# Patient Record
Sex: Female | Born: 1970 | Race: White | Hispanic: No | Marital: Single | Smoking: Former smoker
Health system: Southern US, Community
[De-identification: ages and names within clinical notes are randomized; demographics above are authoritative.]

## PROBLEM LIST (undated history)

## (undated) DIAGNOSIS — E119 Type 2 diabetes mellitus without complications: Secondary | ICD-10-CM

## (undated) DIAGNOSIS — I10 Essential (primary) hypertension: Secondary | ICD-10-CM

## (undated) HISTORY — PX: CARPAL TUNNEL RELEASE: SHX101

## (undated) HISTORY — DX: Essential (primary) hypertension: I10

## (undated) HISTORY — DX: Type 2 diabetes mellitus without complications: E11.9

---

## 2004-08-15 ENCOUNTER — Ambulatory Visit: Payer: Self-pay | Admitting: Unknown Physician Specialty

## 2004-09-15 ENCOUNTER — Ambulatory Visit: Payer: Self-pay | Admitting: Unknown Physician Specialty

## 2005-04-14 ENCOUNTER — Ambulatory Visit: Payer: Self-pay | Admitting: Unknown Physician Specialty

## 2005-04-15 ENCOUNTER — Ambulatory Visit: Payer: Self-pay | Admitting: Unknown Physician Specialty

## 2005-05-15 ENCOUNTER — Ambulatory Visit: Payer: Self-pay | Admitting: Unknown Physician Specialty

## 2005-06-15 ENCOUNTER — Ambulatory Visit: Payer: Self-pay | Admitting: Unknown Physician Specialty

## 2005-07-16 ENCOUNTER — Ambulatory Visit: Payer: Self-pay | Admitting: Unknown Physician Specialty

## 2005-11-27 ENCOUNTER — Emergency Department: Payer: Self-pay | Admitting: Emergency Medicine

## 2006-02-05 ENCOUNTER — Emergency Department: Payer: Self-pay | Admitting: Unknown Physician Specialty

## 2006-02-05 IMAGING — CR DG KNEE COMPLETE 4+V*L*
1 series · 4 of 4 positions shown · non-contrast
Comparison: none

REASON FOR EXAM: FALL
COMMENTS:

[Series 1: view not recorded · 0.17mm/px · 4 of 4 slices shown]
[im 1/4]
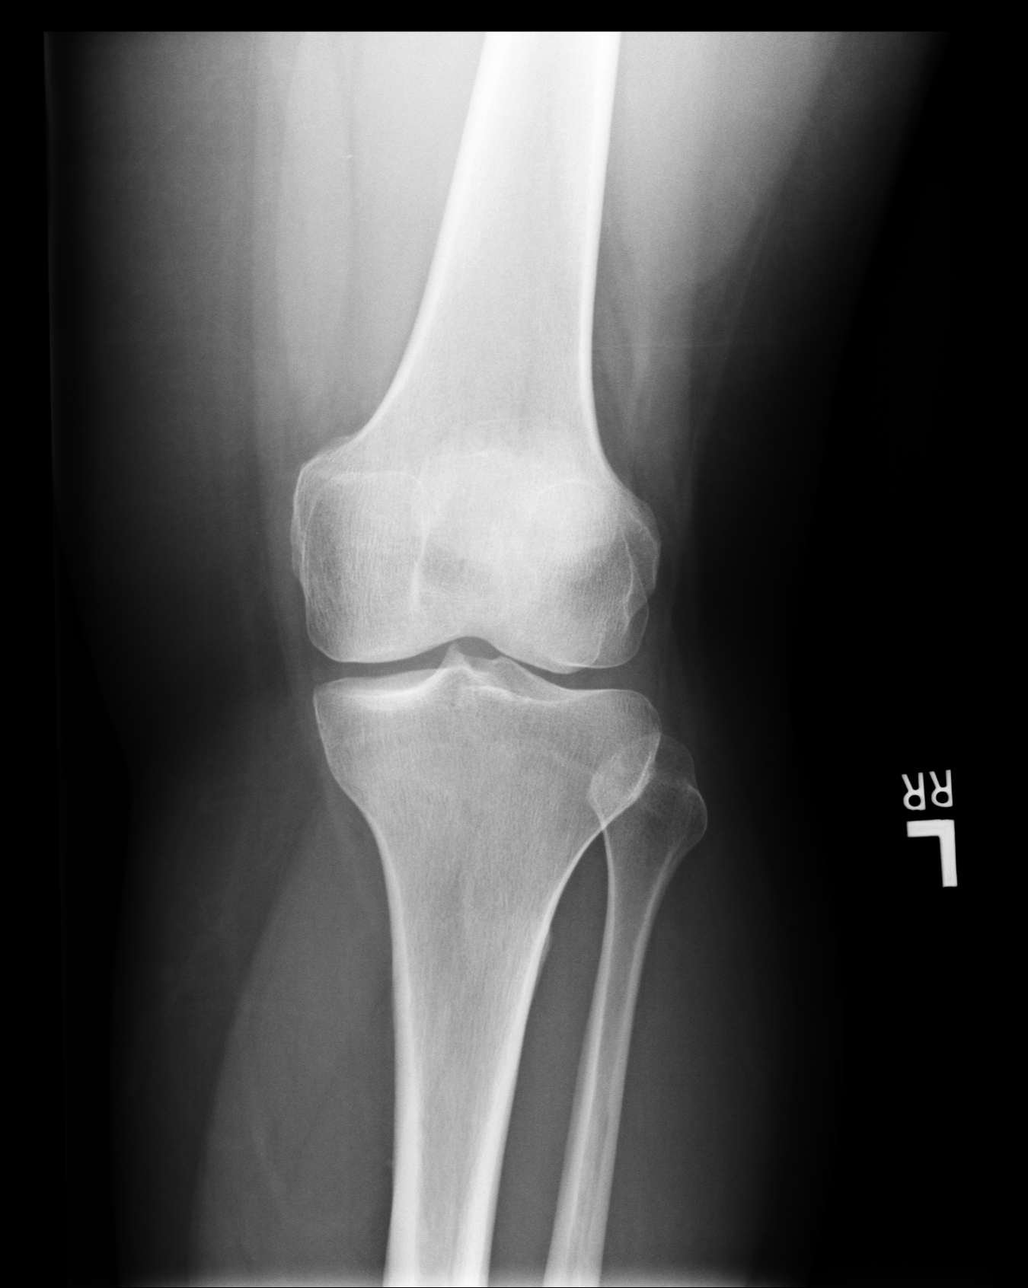
[im 2/4]
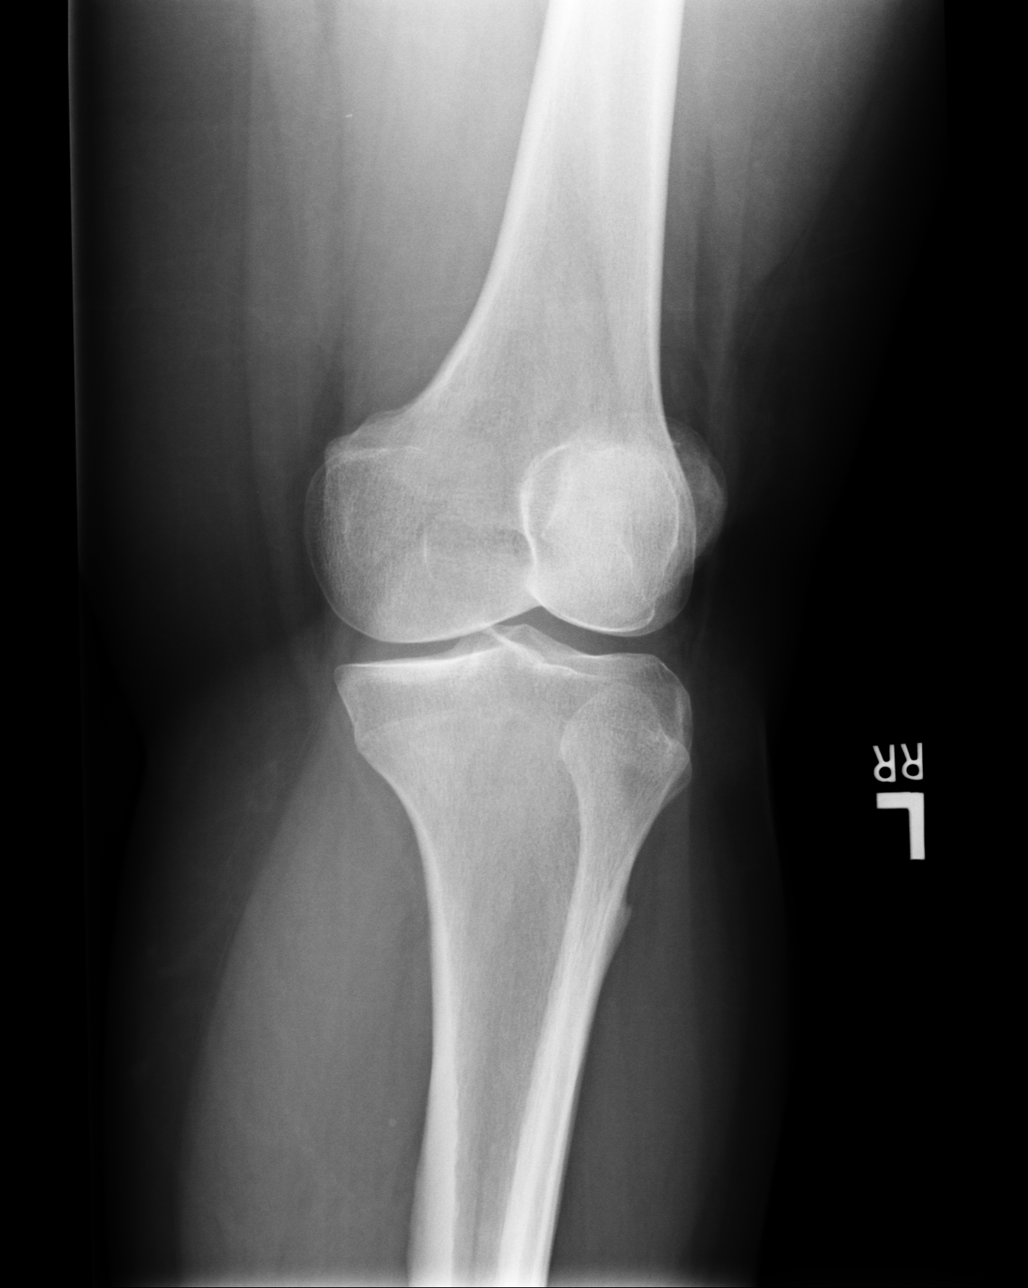
[im 3/4]
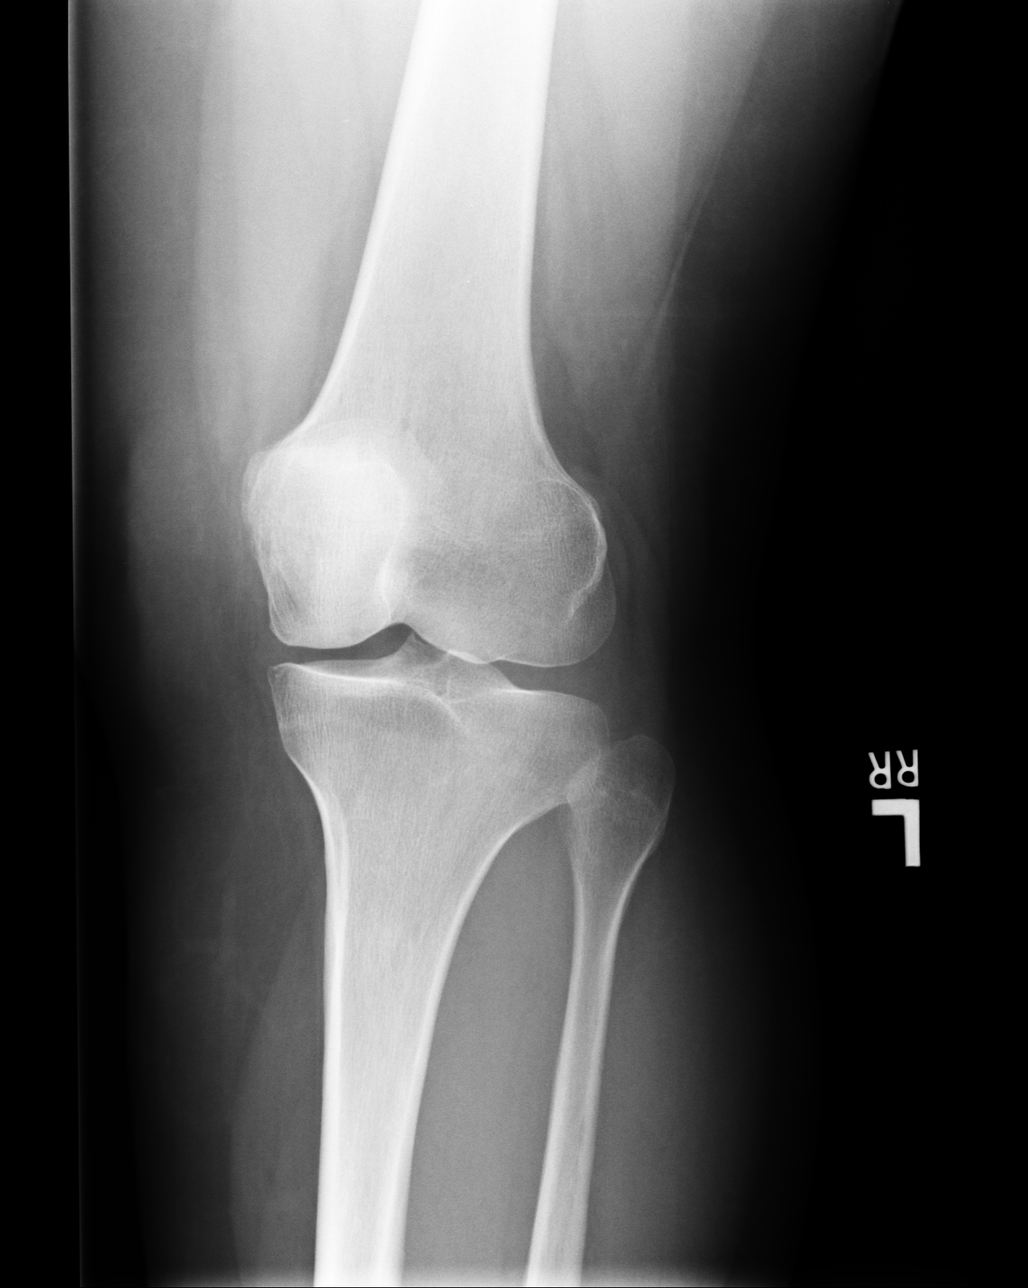
[im 4/4]
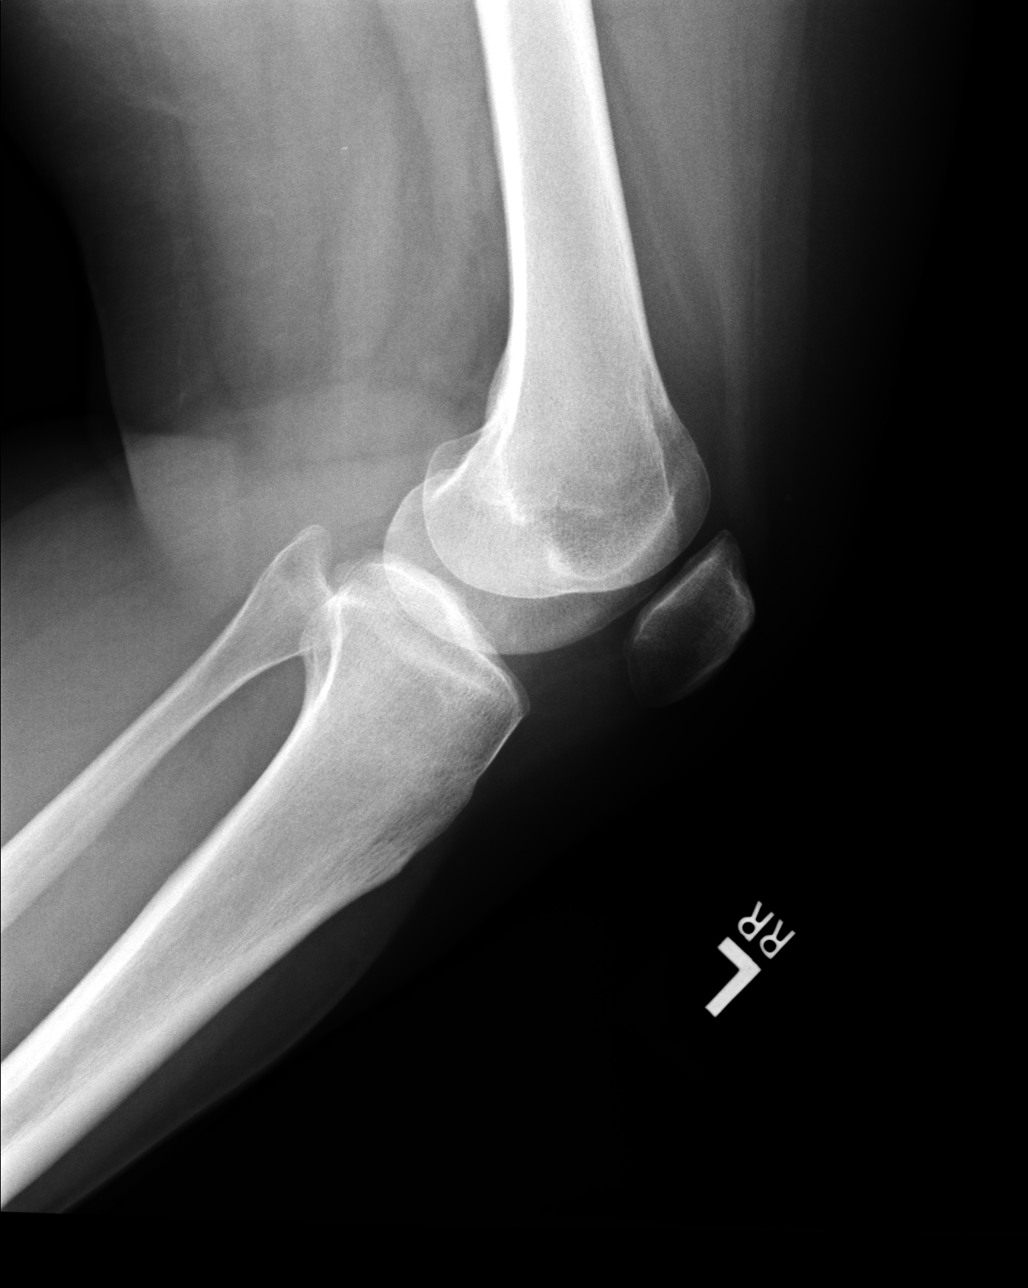

[4 of 4 positions shown; findings below may reference images not displayed]

PROCEDURE:     DXR - DXR KNEE LT COMP WITH OBLIQUES  - [DATE]  [DATE]

RESULT:          There does not appear to be evidence of fracture,
dislocation, or malalignment. No evidence of prepatellar effusion is
identified. If there is persistent clinical concern or persistent complaints
of pain, repeat evaluation in 7-10 days is recommended if clinically
warranted.
IMPRESSION: Unremarkable LEFT knee as described above.

## 2006-04-26 ENCOUNTER — Other Ambulatory Visit: Payer: Self-pay

## 2006-04-26 ENCOUNTER — Emergency Department: Payer: Self-pay | Admitting: Emergency Medicine

## 2006-09-09 ENCOUNTER — Emergency Department: Payer: Self-pay | Admitting: Internal Medicine

## 2006-09-09 ENCOUNTER — Other Ambulatory Visit: Payer: Self-pay

## 2006-09-25 ENCOUNTER — Emergency Department: Payer: Self-pay | Admitting: Emergency Medicine

## 2006-09-25 ENCOUNTER — Other Ambulatory Visit: Payer: Self-pay

## 2006-09-25 IMAGING — CR DG CHEST 2V
1 series · 2 of 2 positions shown · non-contrast
Comparison: none

REASON FOR EXAM: Chest Pain
COMMENTS:  LMP: > one month ago

PROCEDURE:     DXR - DXR CHEST PA (OR AP) AND LATERAL  - [DATE]  [DATE]
RESULT:          PA and lateral views of the chest demonstrate cardiomegaly.
 There is no definite infiltrate or effusion.  There is no pneumothorax.
The inspiratory effort is somewhat shallow.

[Series 1: view not recorded · 0.17mm/px · 2 of 2 slices shown]
[im 1/2]
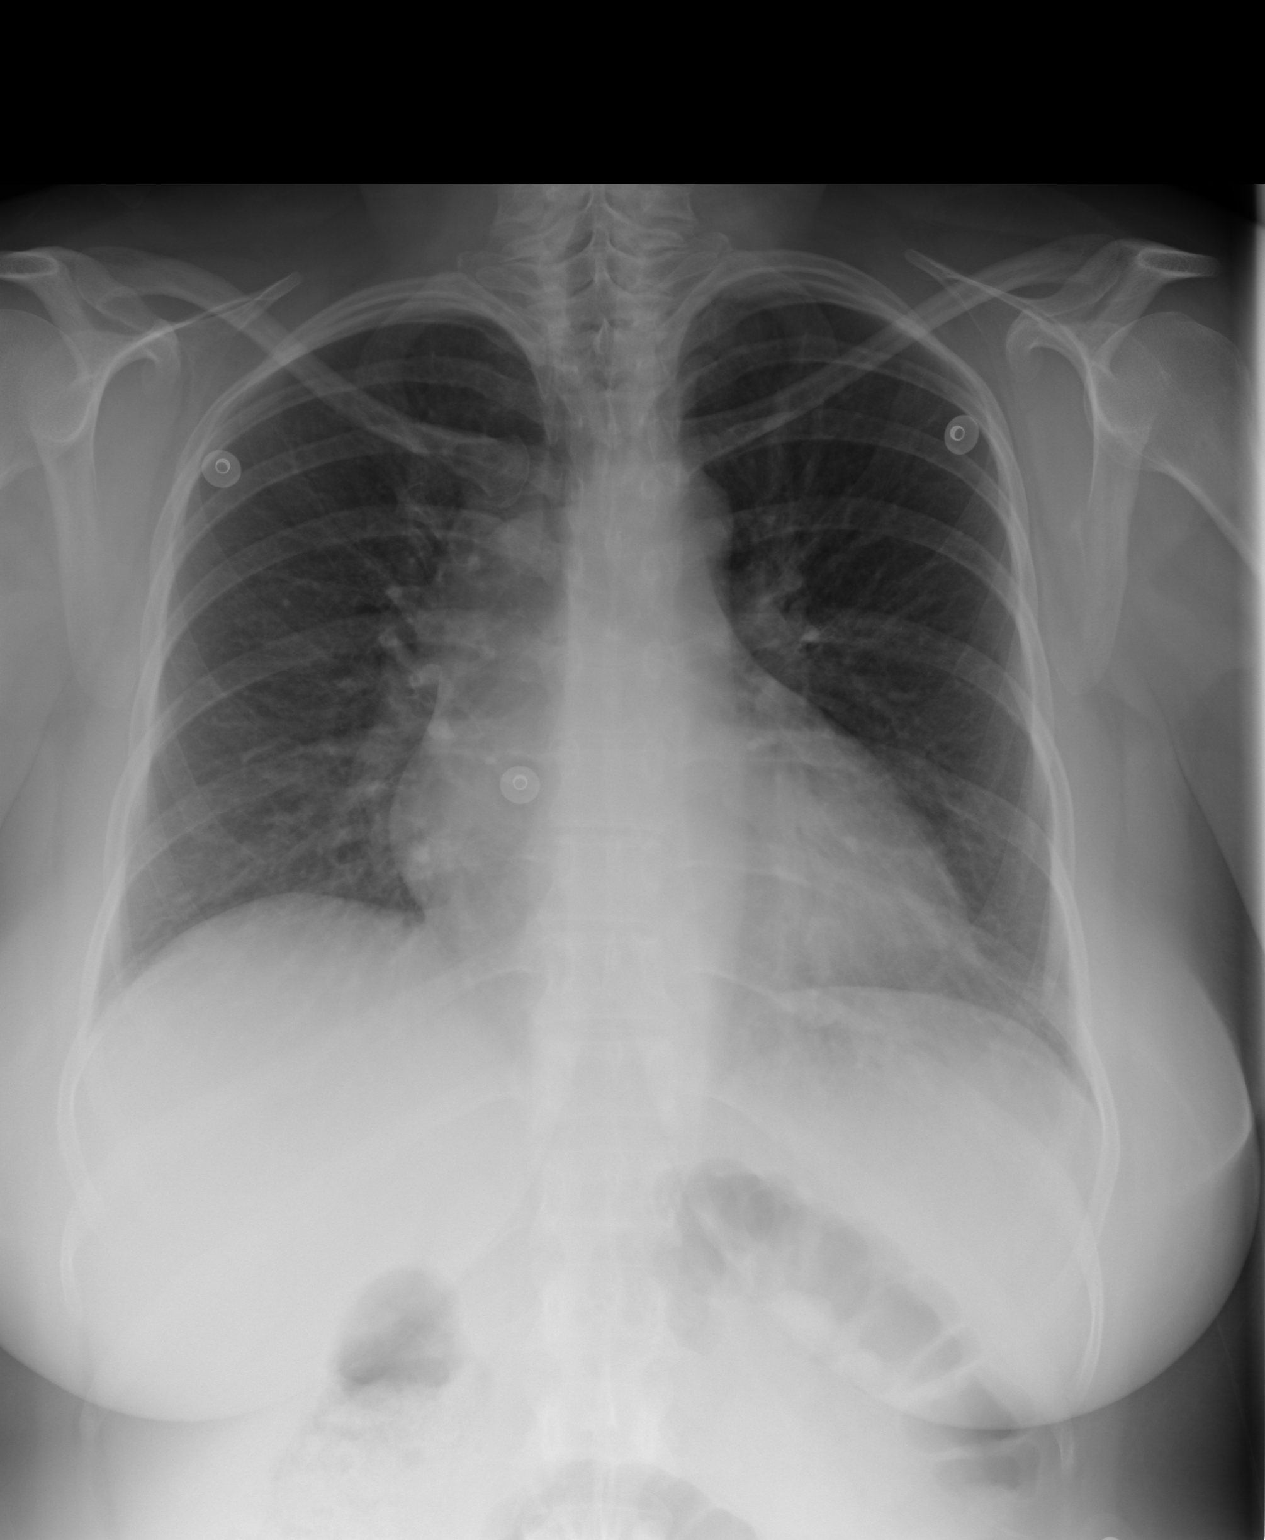
[im 2/2]
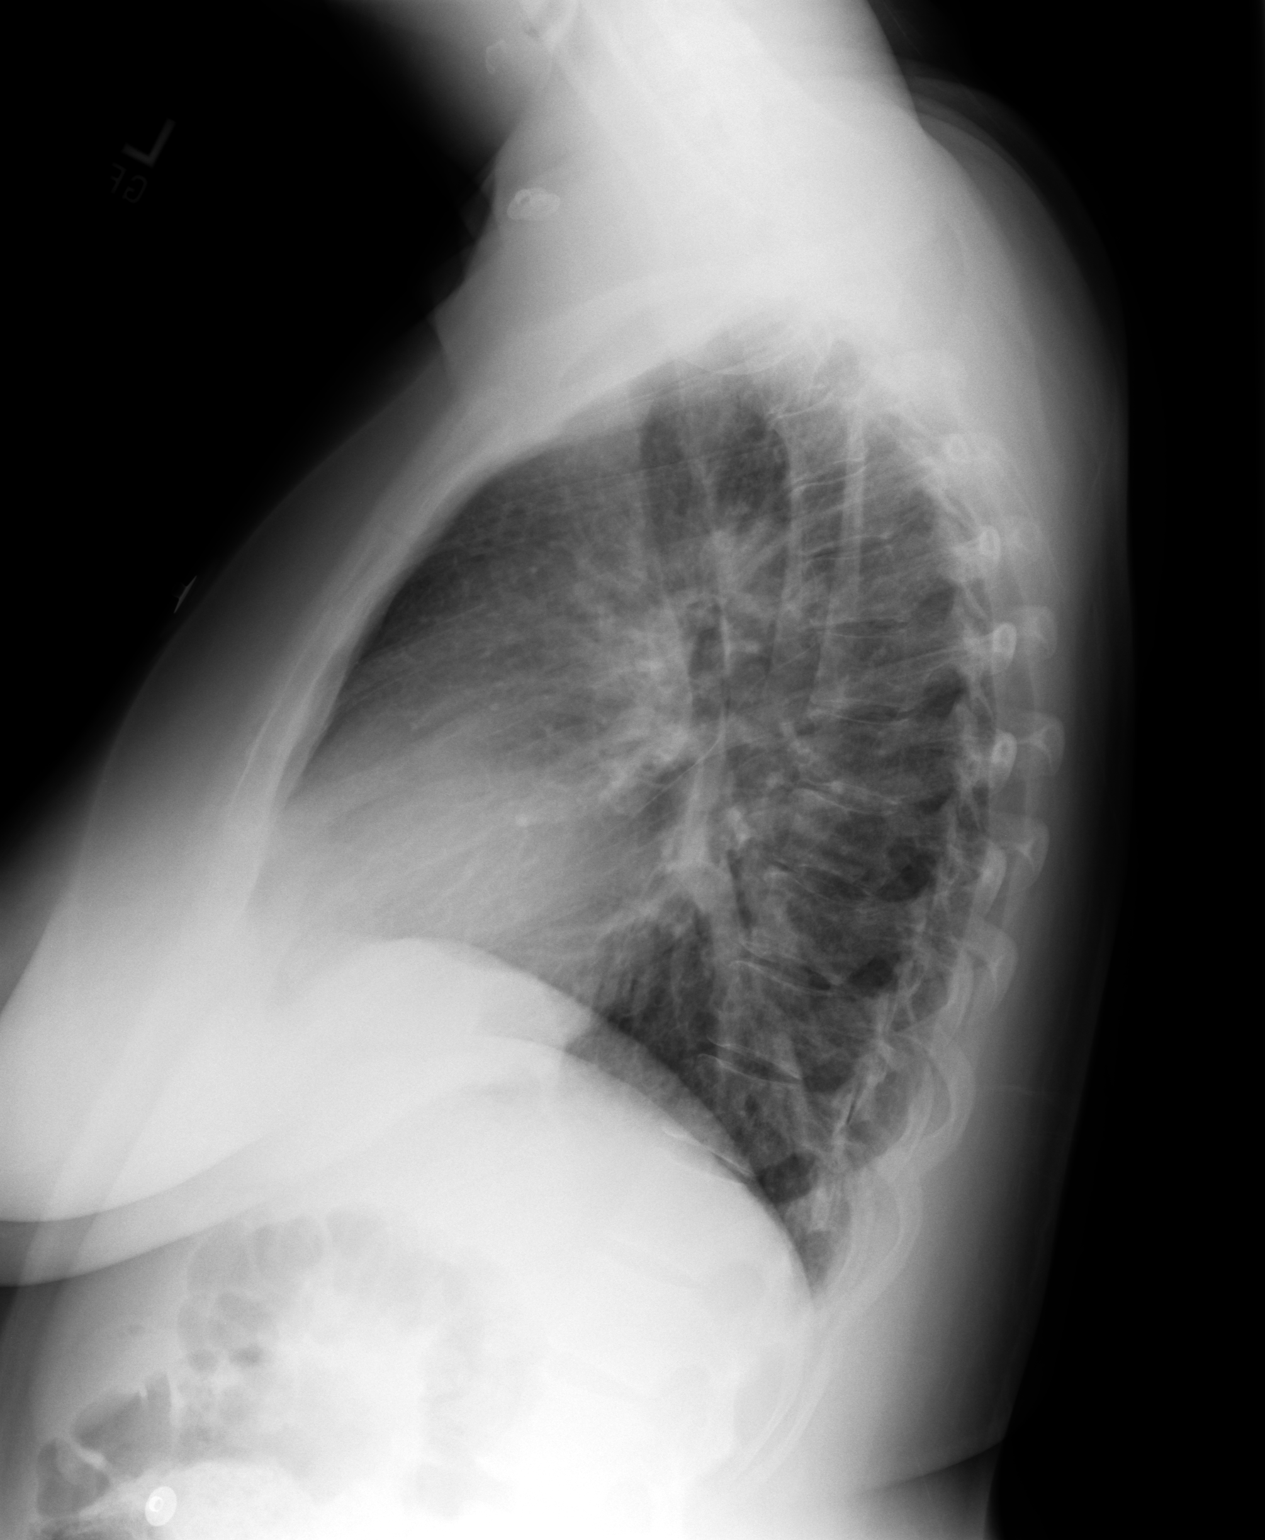

[2 of 2 positions shown; findings below may reference images not displayed]

IMPRESSION: 1.     No acute cardiopulmonary disease.
2.     Mild cardiomegaly.

## 2006-10-25 ENCOUNTER — Emergency Department: Payer: Self-pay | Admitting: Emergency Medicine

## 2007-02-18 ENCOUNTER — Inpatient Hospital Stay: Payer: Self-pay | Admitting: Specialist

## 2007-02-18 IMAGING — CR DG CHEST 2V
1 series · 2 of 2 positions shown · non-contrast
Comparison: none

REASON FOR EXAM: cough, presented with dka
COMMENTS:

[Series 1: view not recorded · 0.17mm/px · 2 of 2 slices shown]
[im 1/2]
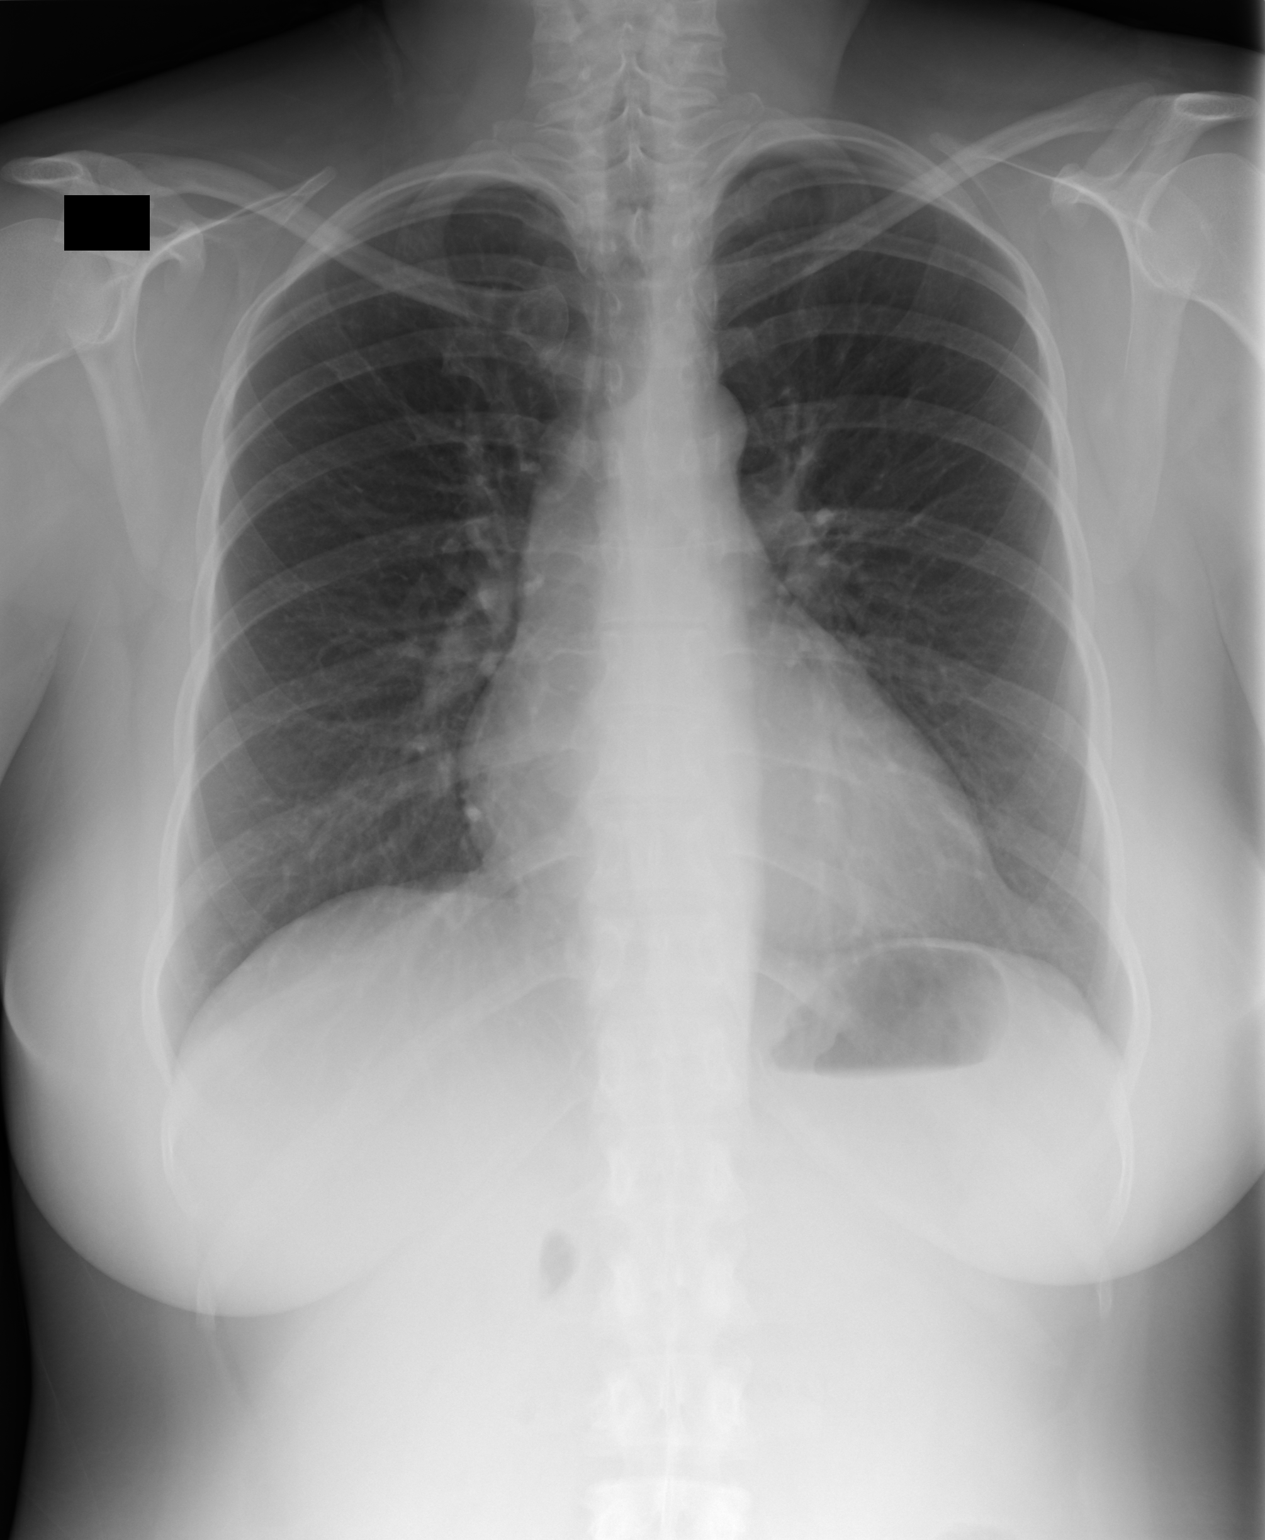
[im 2/2]
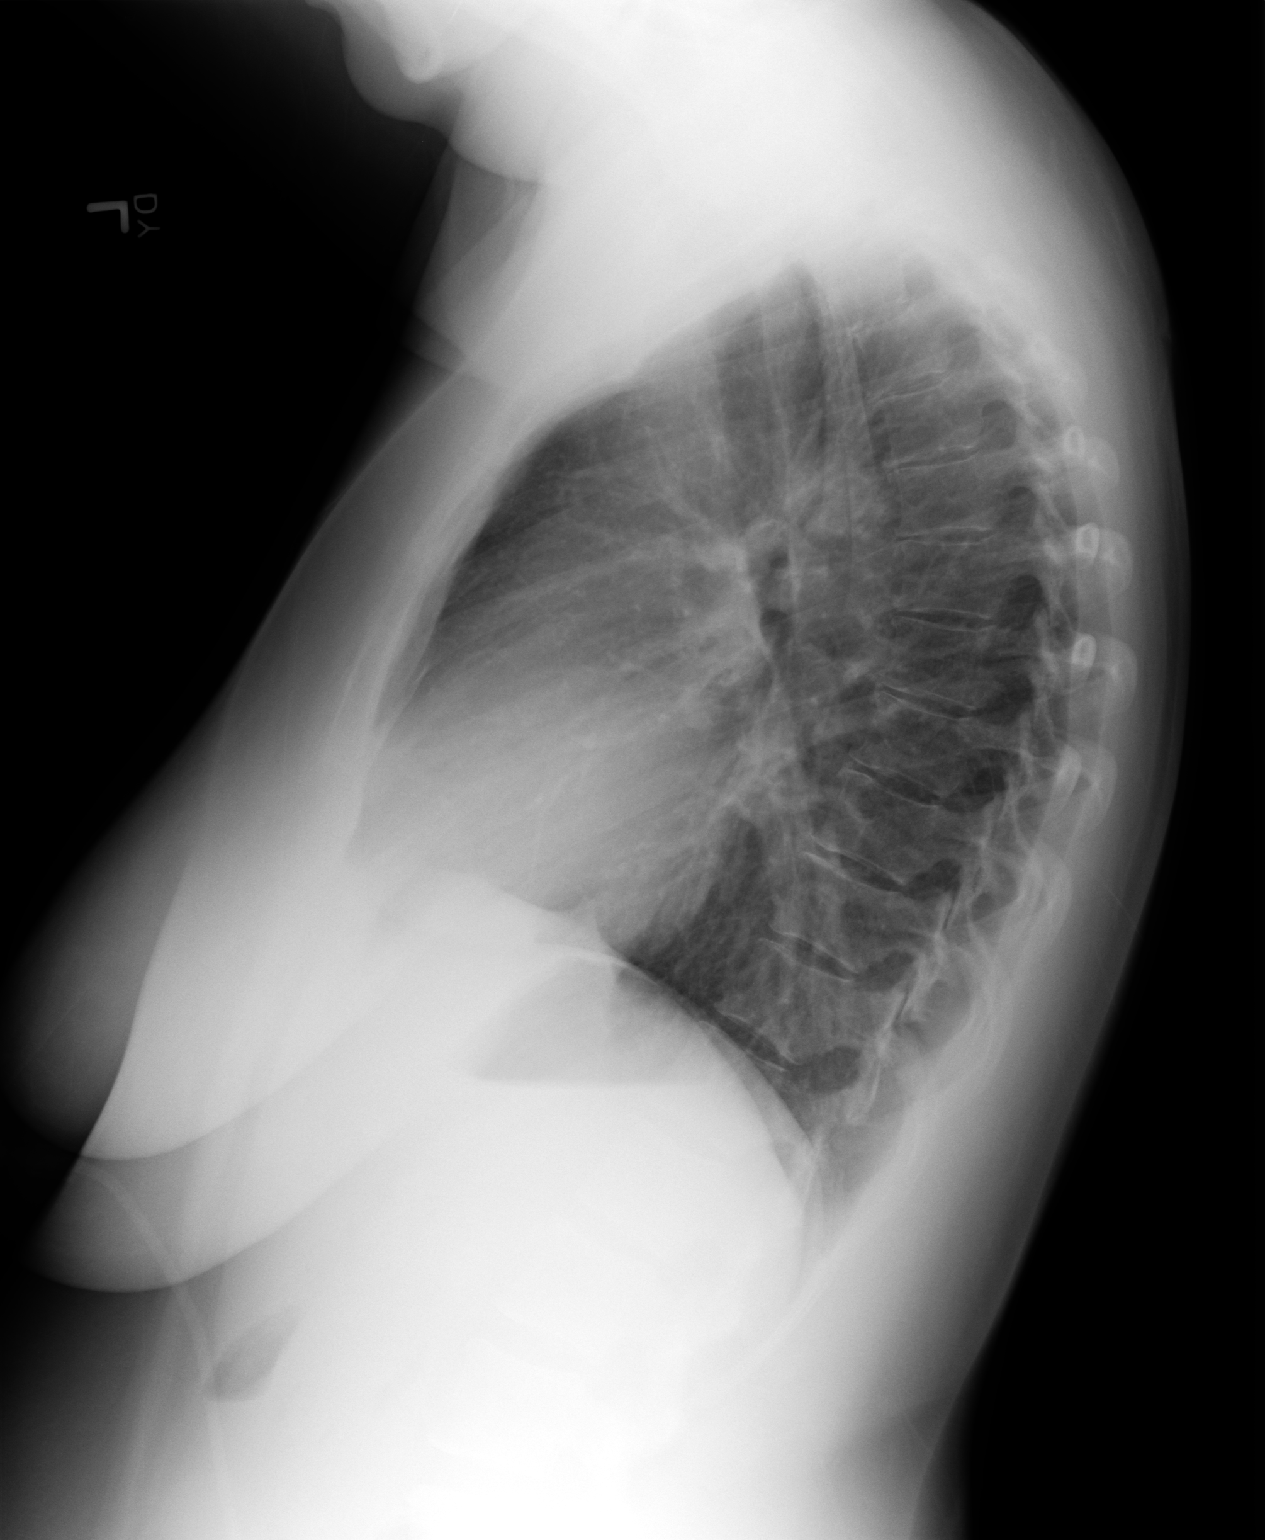

[2 of 2 positions shown; findings below may reference images not displayed]

PROCEDURE:     DXR - DXR CHEST PA (OR AP) AND LATERAL  - [DATE]  [DATE]

RESULT:     Comparison is made to study of [DATE]. The lungs are clear.
The heart and pulmonary vessels are normal. The bony and mediastinal
structures are unremarkable. There is no effusion. There is no pneumothorax
or evidence of congestive failure.
IMPRESSION: No acute cardiopulmonary disease.

## 2007-03-03 ENCOUNTER — Emergency Department: Payer: Self-pay | Admitting: Emergency Medicine
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
## 2007-03-06 ENCOUNTER — Emergency Department: Payer: Self-pay | Admitting: Emergency Medicine

## 2007-03-06 ENCOUNTER — Other Ambulatory Visit: Payer: Self-pay

## 2007-04-17 ENCOUNTER — Other Ambulatory Visit: Payer: Self-pay

## 2007-04-17 ENCOUNTER — Emergency Department: Payer: Self-pay | Admitting: Emergency Medicine

## 2007-04-20 ENCOUNTER — Other Ambulatory Visit: Payer: Self-pay

## 2007-04-20 ENCOUNTER — Inpatient Hospital Stay: Payer: Self-pay | Admitting: Internal Medicine

## 2007-05-06 ENCOUNTER — Other Ambulatory Visit: Payer: Self-pay

## 2007-05-06 ENCOUNTER — Observation Stay: Payer: Self-pay | Admitting: Internal Medicine

## 2007-05-06 IMAGING — CR DG CHEST 1V PORT
1 series · 1 of 1 positions shown · non-contrast
Comparison: none

REASON FOR EXAM: hypoglycemia and SOB; [HOSPITAL]
COMMENTS:

PROCEDURE:     DXR - DXR PORTABLE CHEST SINGLE VIEW  - [DATE] [DATE]
RESULT:     Comparison is made to a prior exam of [DATE].  The lung fields
are clear. The heart, mediastinal and osseous structures show no significant
abnormalities.  Monitoring electrodes are present.

[view not recorded]
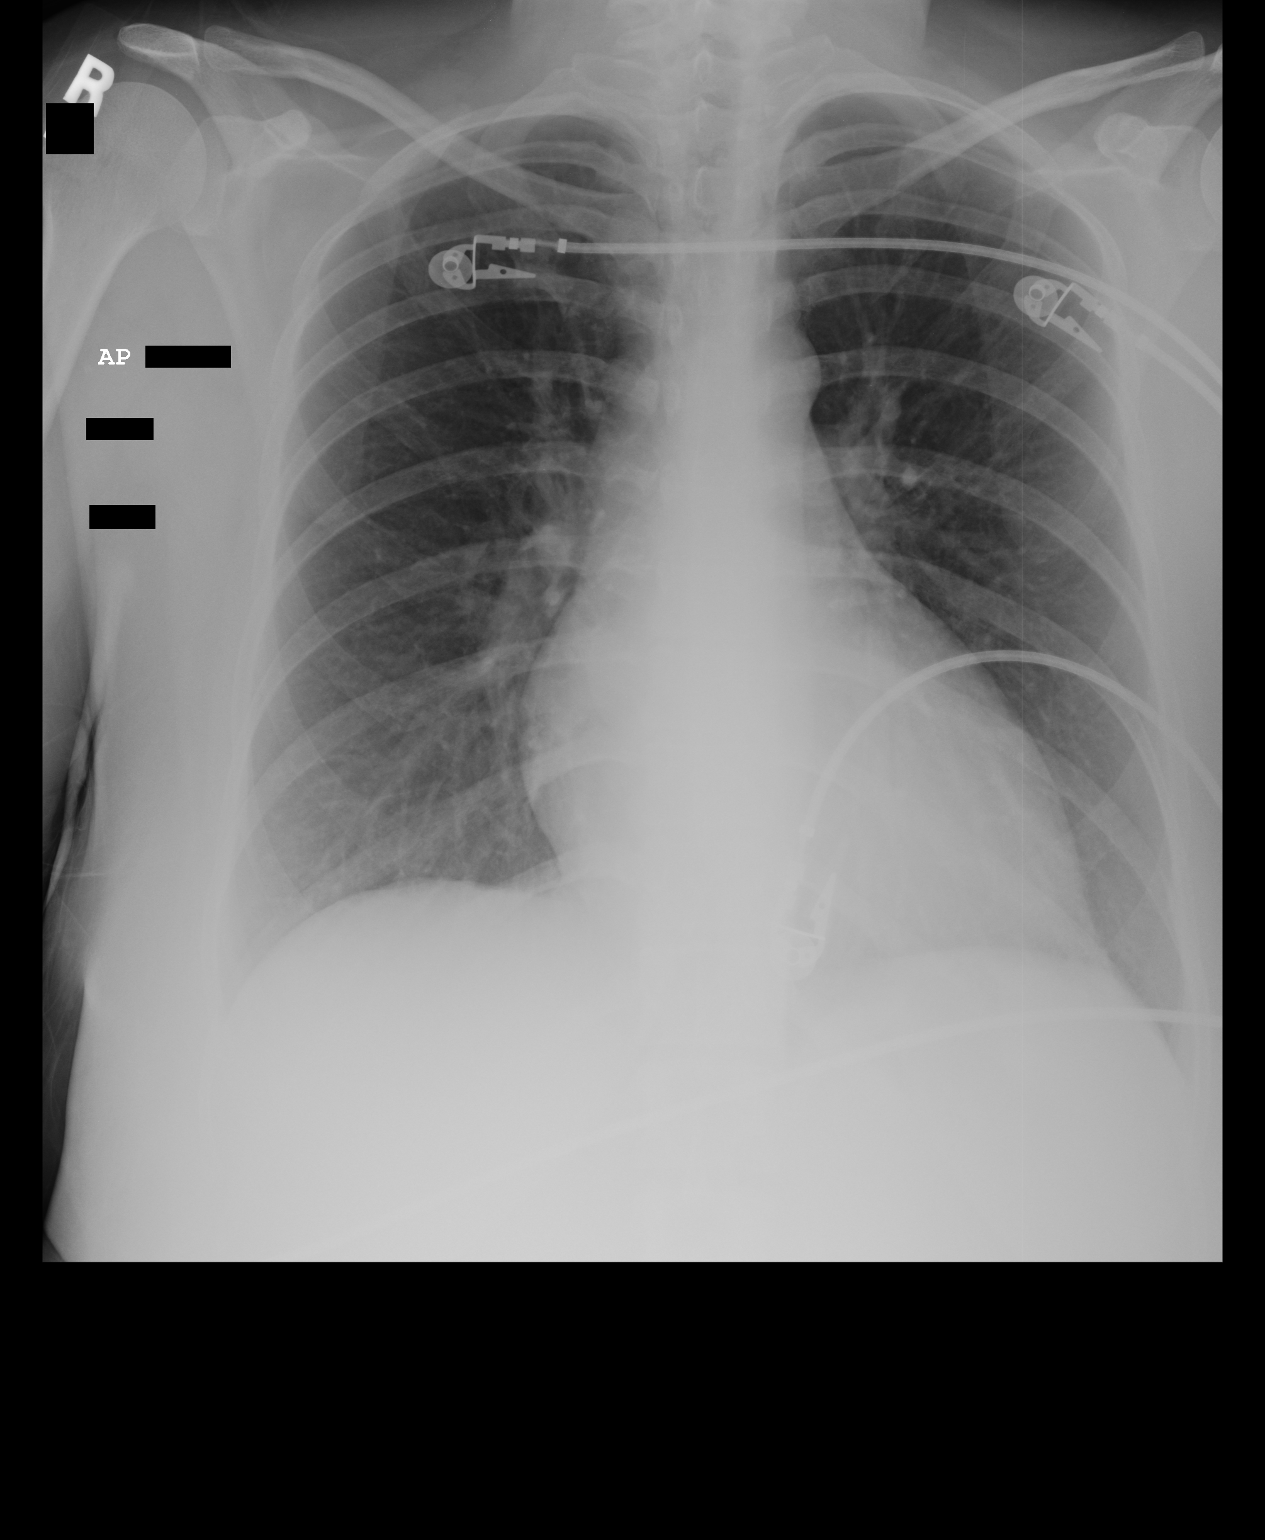

[1 of 1 positions shown; findings below may reference images not displayed]

IMPRESSION: 1)No acute changes are identified.

## 2008-09-07 ENCOUNTER — Emergency Department: Payer: Self-pay | Admitting: Emergency Medicine

## 2008-09-22 ENCOUNTER — Emergency Department: Payer: Self-pay | Admitting: Emergency Medicine

## 2008-10-14 ENCOUNTER — Emergency Department: Payer: Self-pay | Admitting: Emergency Medicine

## 2008-10-14 IMAGING — CR DG CHEST 2V
1 series · 2 of 2 positions shown · non-contrast
Comparison: none

REASON FOR EXAM: chest pain sickle cell disese
COMMENTS:   LMP: (Male)

[Series 1: view not recorded · 0.17mm/px · 2 of 2 slices shown]
[im 1/2]
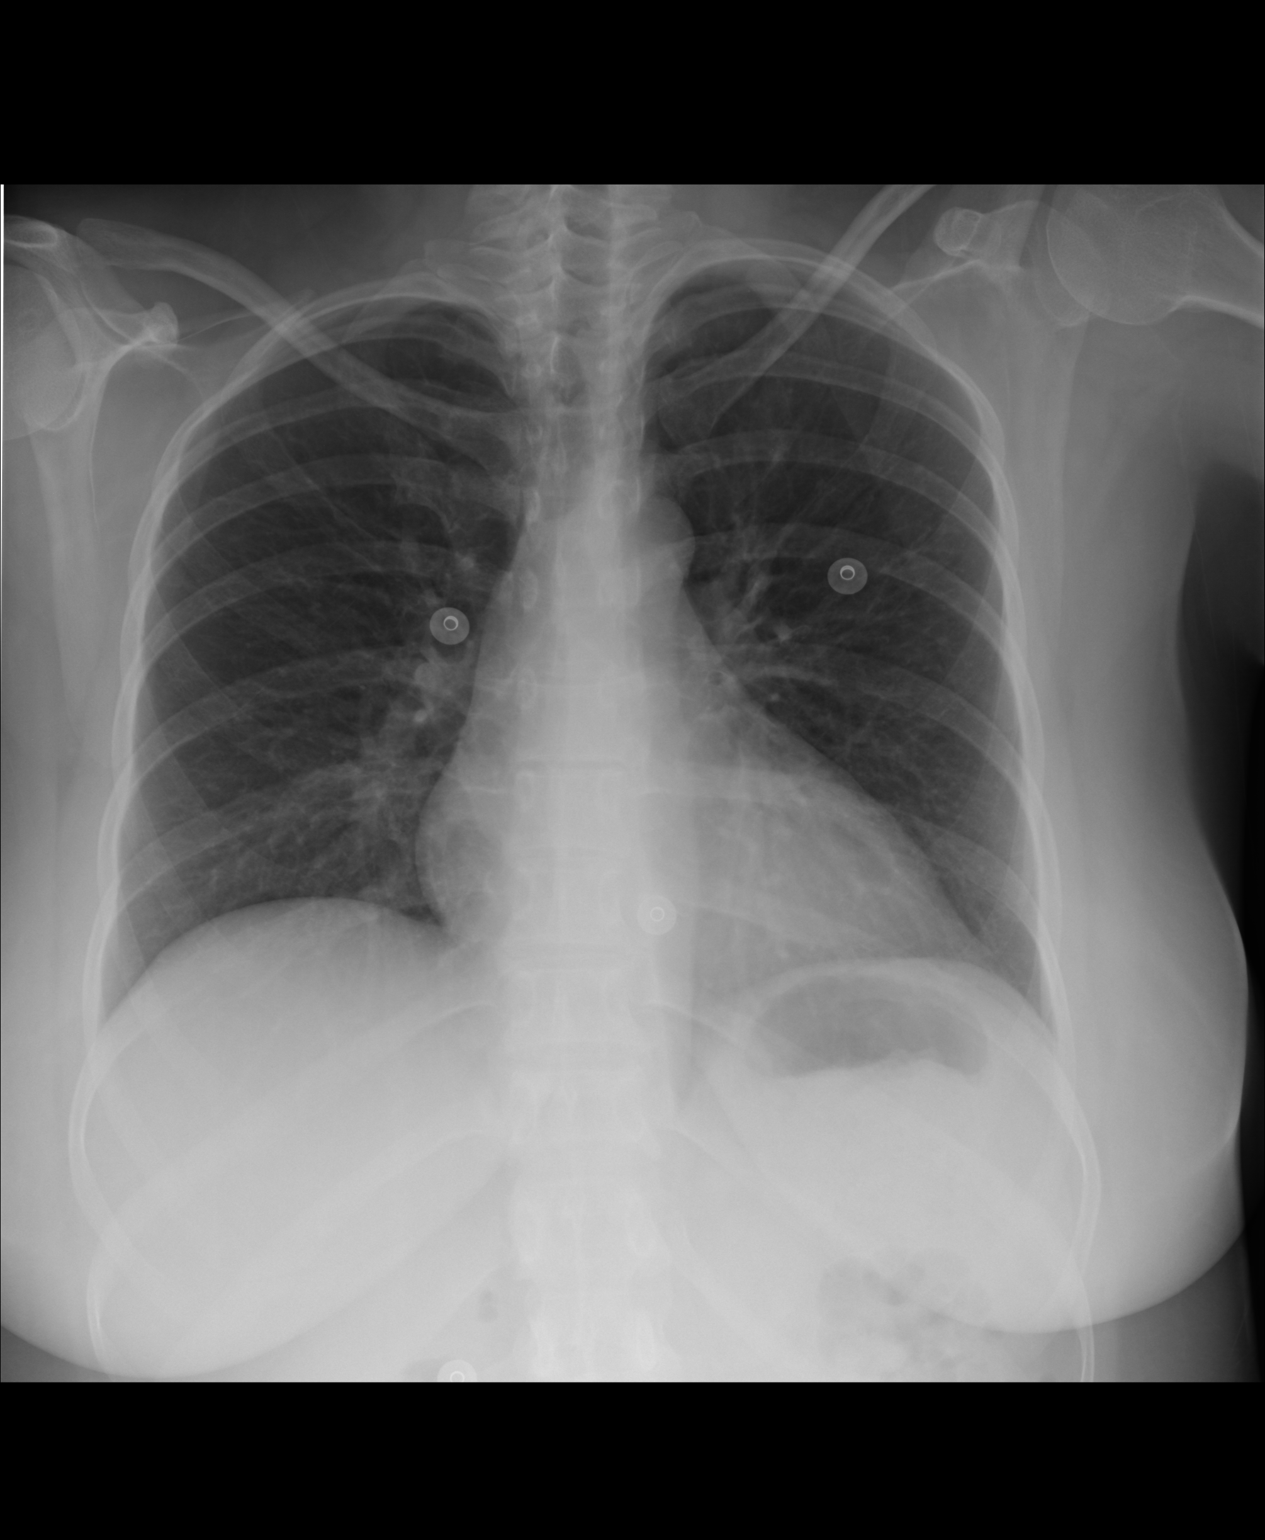
[im 2/2]
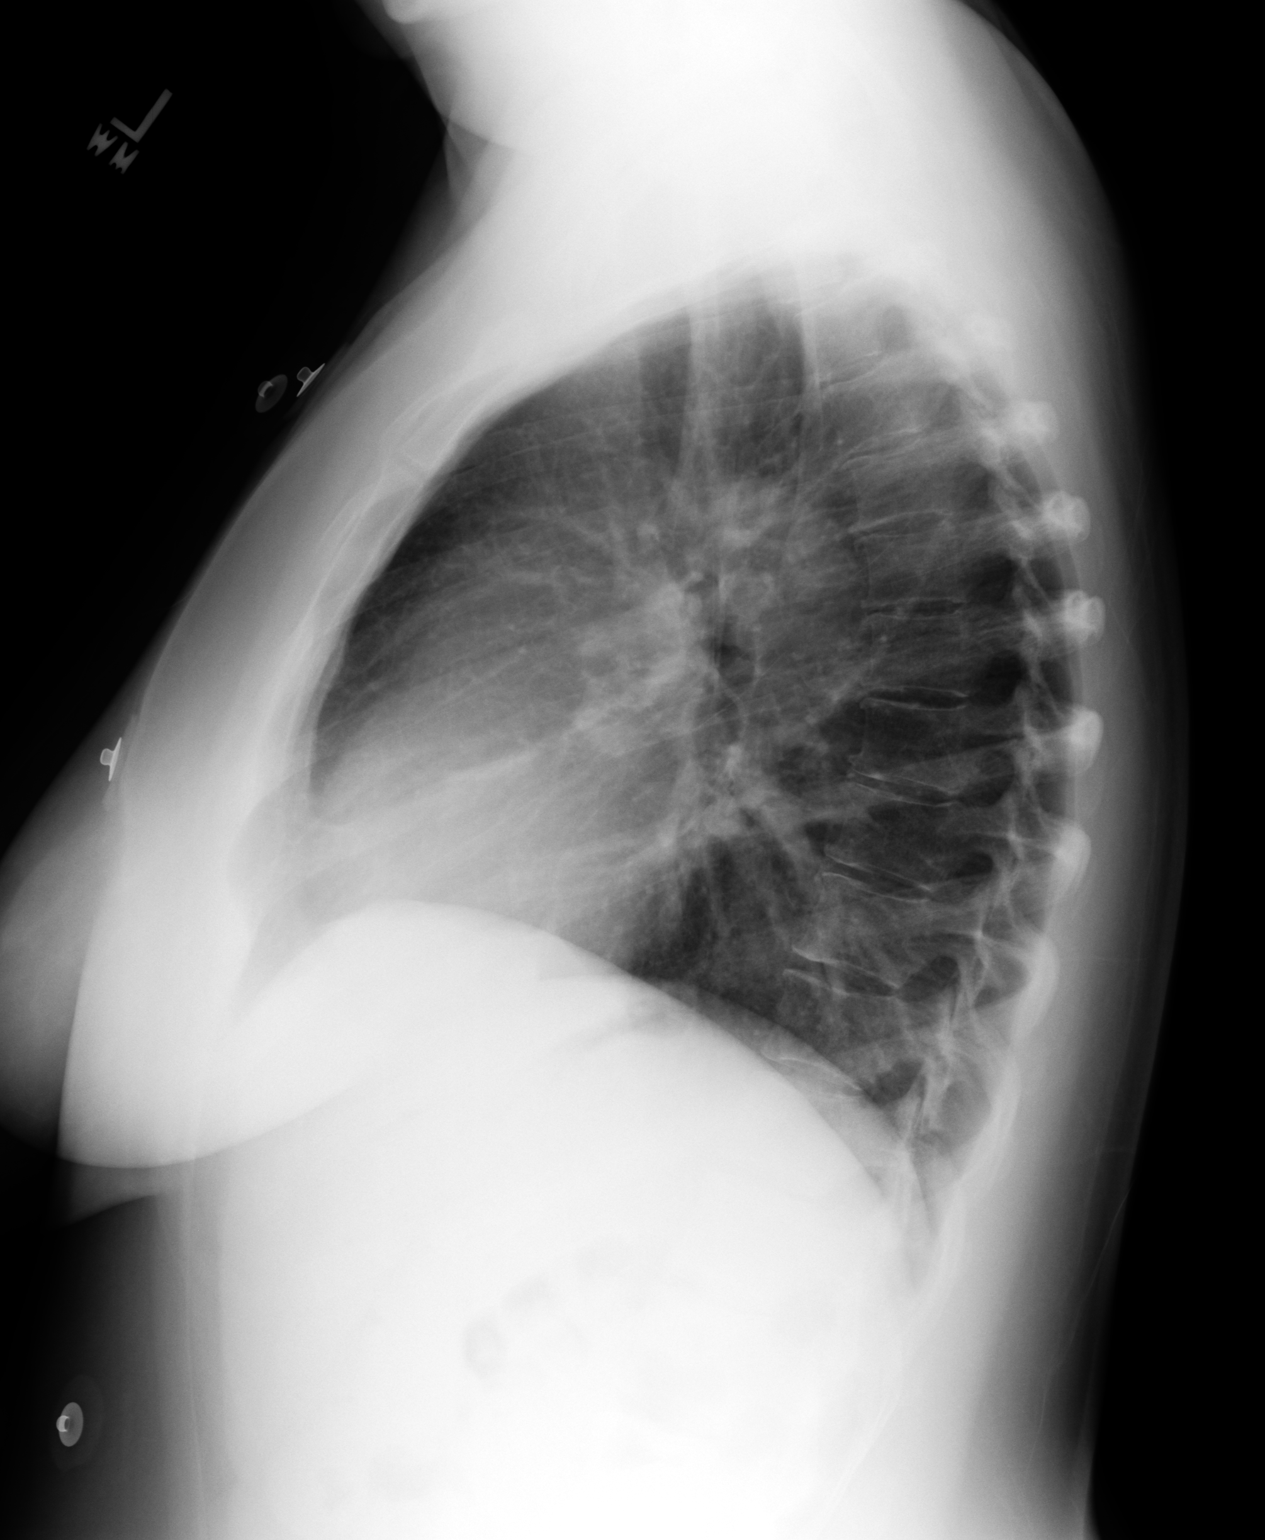

[2 of 2 positions shown; findings below may reference images not displayed]

PROCEDURE:     DXR - DXR CHEST PA (OR AP) AND LATERAL  - [DATE]  [DATE]

RESULT:     Comparison is made to the prior exam of [DATE]. The lung fields
are clear. No pneumonia, pneumothorax or pleural effusion is seen. Heart
size is upper limits for normal but stable as compared to the prior exam.
The mediastinal and osseous structures are normal in appearance.
IMPRESSION: No acute changes are identified.

## 2008-11-02 ENCOUNTER — Inpatient Hospital Stay: Payer: Self-pay | Admitting: Internal Medicine

## 2008-11-02 IMAGING — CR DG CHEST 2V
1 series · 2 of 2 positions shown · non-contrast
Comparison: none

REASON FOR EXAM: Chest Pain
COMMENTS:

[Series 1: view not recorded · 0.17mm/px · 2 of 2 slices shown]
[im 1/2]
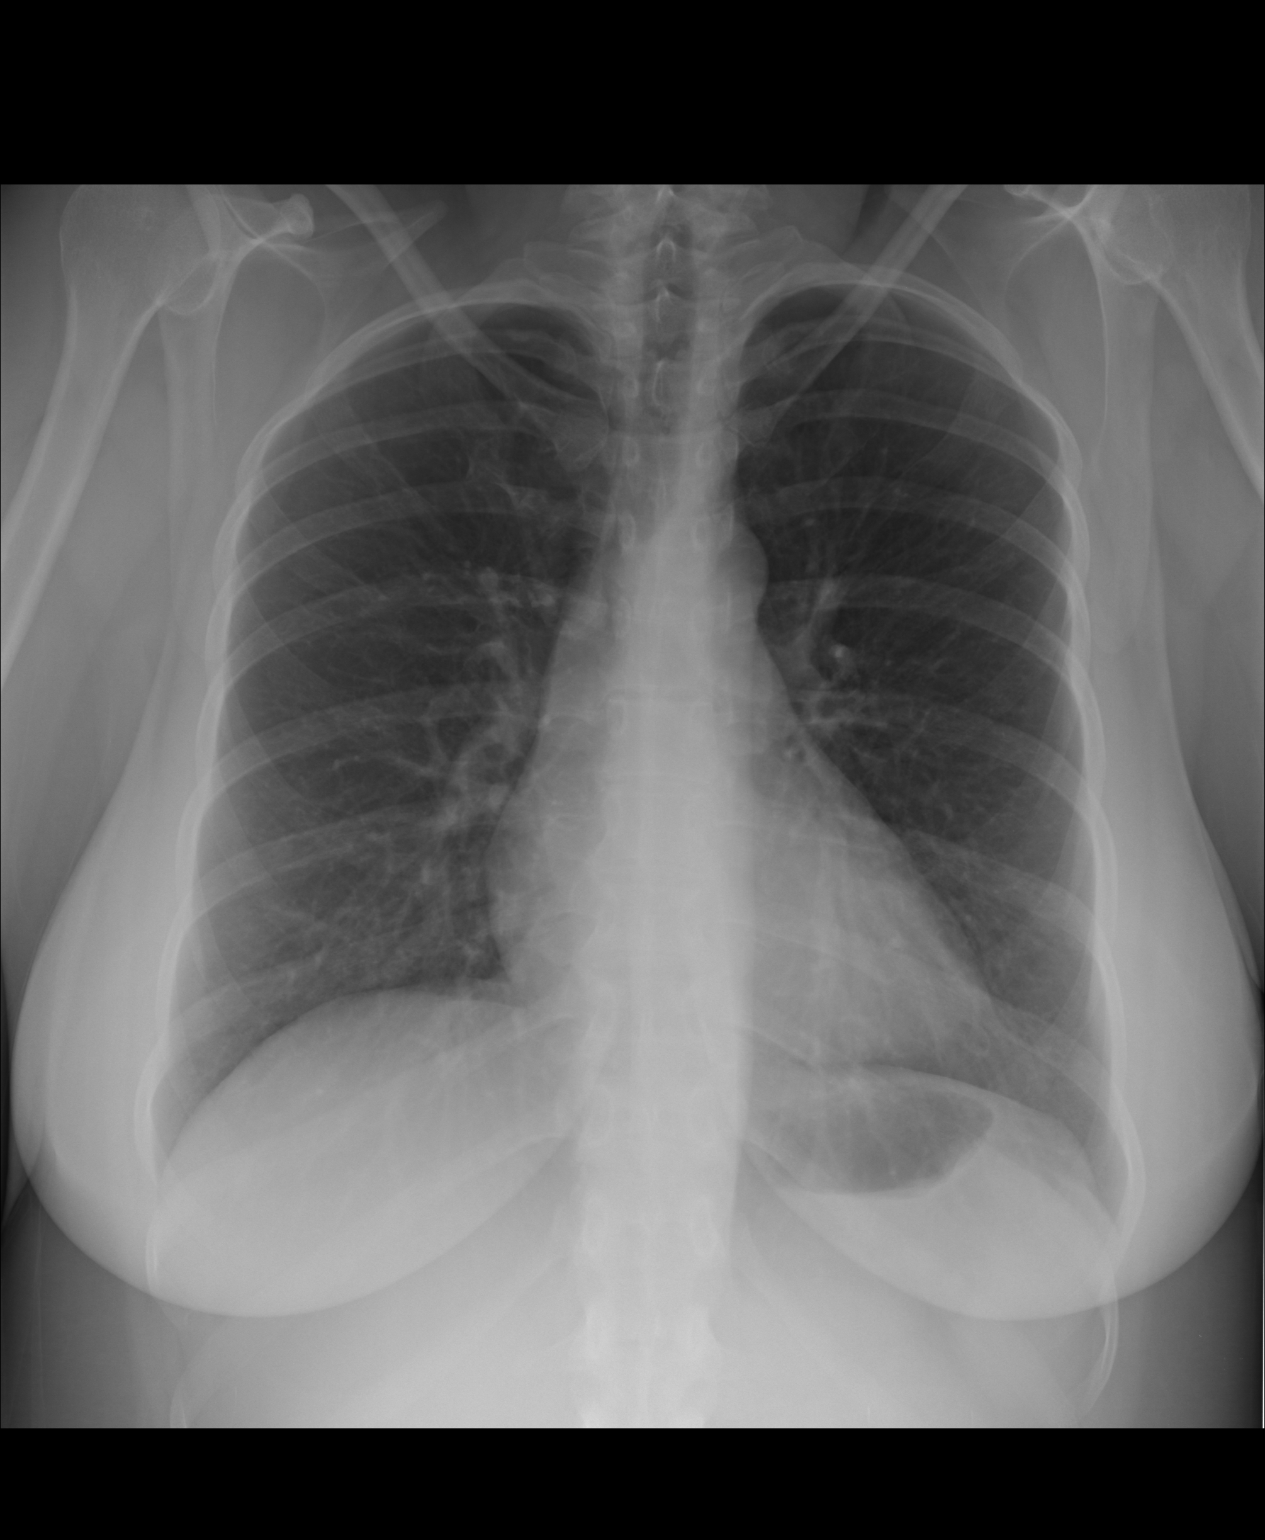
[im 2/2]
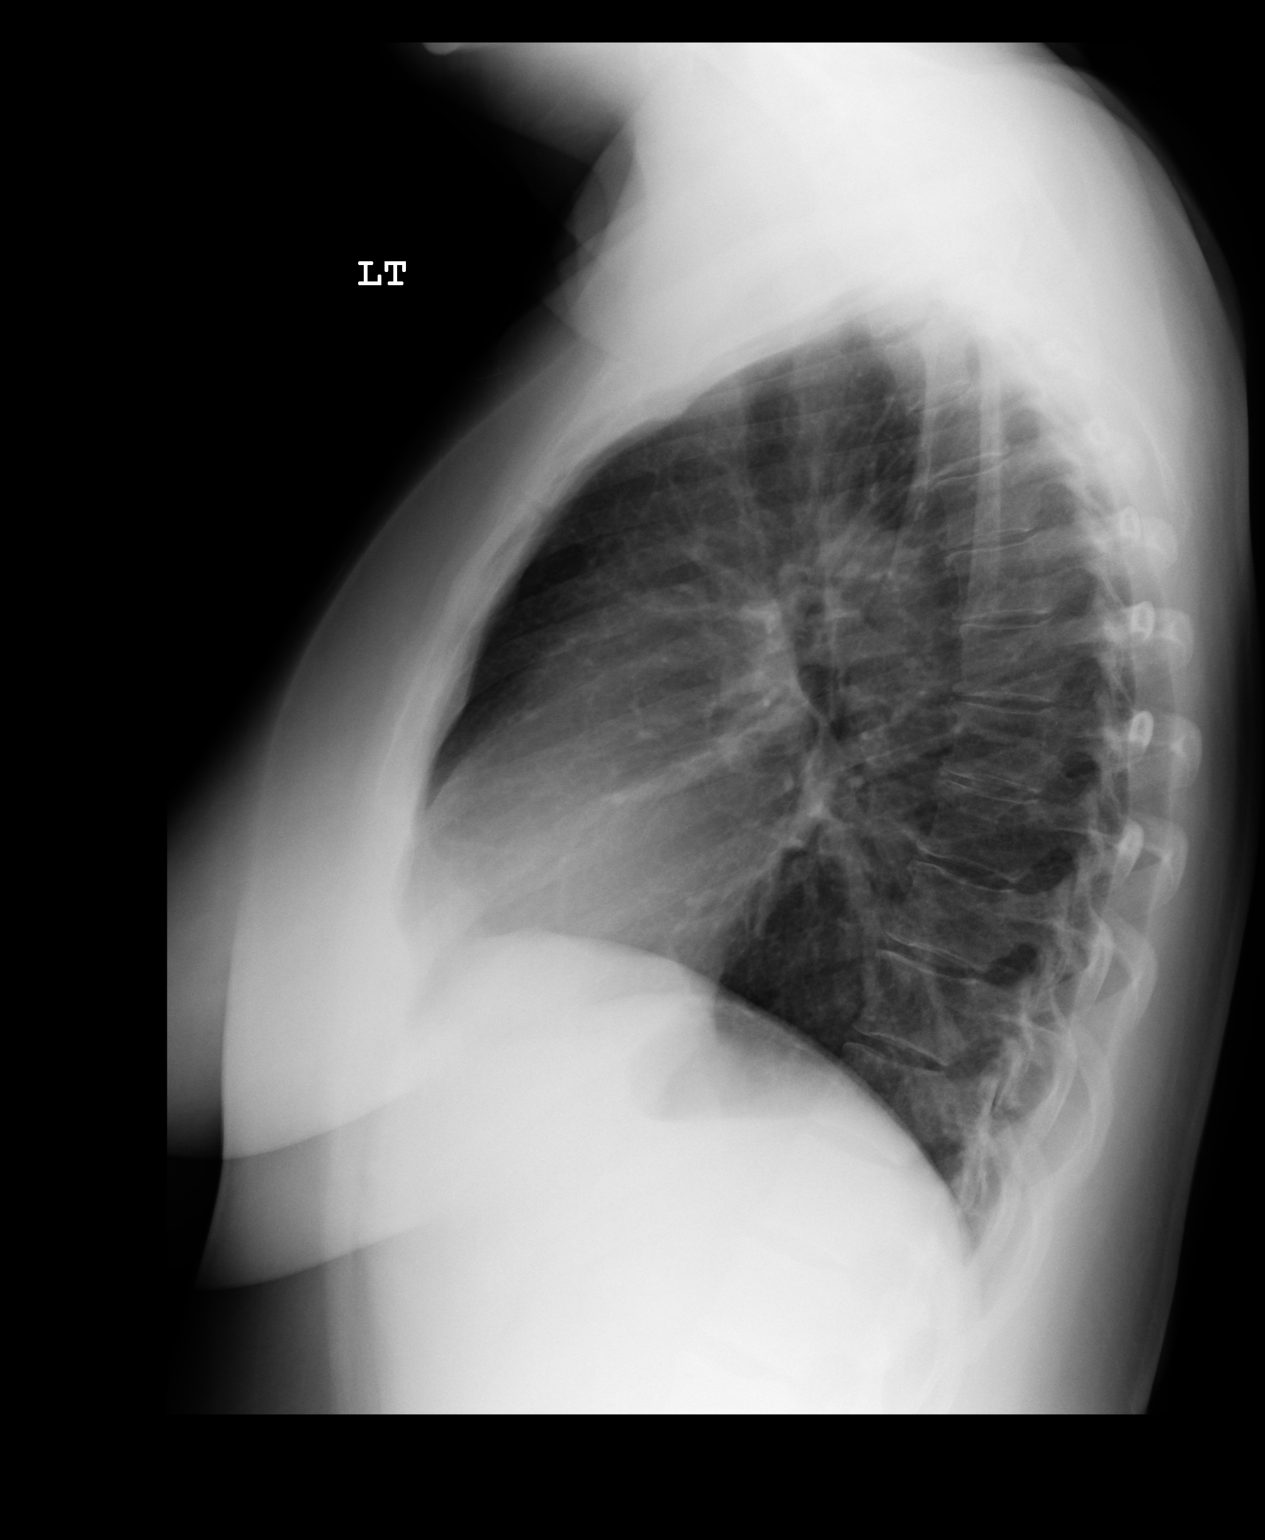

[2 of 2 positions shown; findings below may reference images not displayed]

PROCEDURE:     DXR - DXR CHEST PA (OR AP) AND LATERAL  - [DATE]  [DATE]

RESULT:     Comparison is made to the examination of [DATE].

The lungs are clear. The heart and pulmonary vessels are normal. The bony
and mediastinal structures are unremarkable. There is no effusion. There is
no pneumothorax or evidence of congestive failure.
IMPRESSION: No acute cardiopulmonary disease. Stable appearance.

## 2008-11-20 ENCOUNTER — Ambulatory Visit: Payer: Self-pay | Admitting: Unknown Physician Specialty

## 2008-12-16 ENCOUNTER — Emergency Department: Payer: Self-pay | Admitting: Emergency Medicine

## 2008-12-16 ENCOUNTER — Ambulatory Visit: Payer: Self-pay | Admitting: Unknown Physician Specialty

## 2008-12-26 ENCOUNTER — Inpatient Hospital Stay: Payer: Self-pay | Admitting: Internal Medicine

## 2008-12-26 IMAGING — CT CT HEAD WITHOUT CONTRAST
2 series · 16 of 30 positions shown, 20 images · non-contrast
Comparison: none

REASON FOR EXAM: unresponsive
COMMENTS:

PROCEDURE:     CT  - CT HEAD WITHOUT CONTRAST  - [DATE]  [DATE]
RESULT:     Comparison: [DATE]
TECHNIQUE: Multiple axial images from the foramen magnum to the vertex were
obtained without IV contrast.

[Series 2: without · axial · non-contrast · 0.40mm/px · z∈[+890,+1020]mm · 13 of 32 slices shown, 17 images]
[im 3/32  brain]
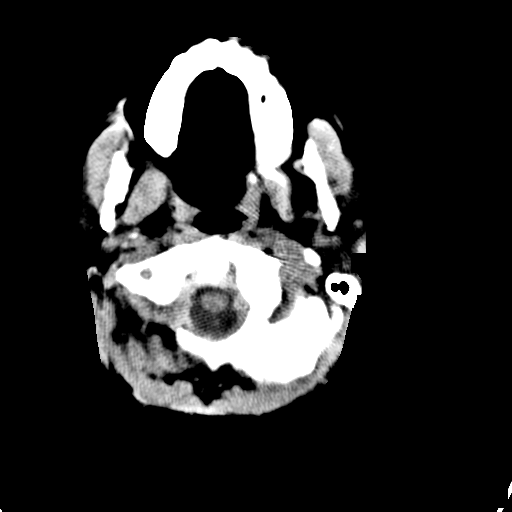
[im 3/32  bone]
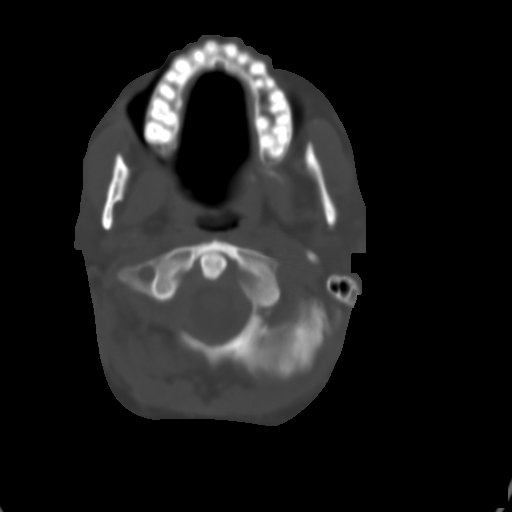
[im 5/32  brain]
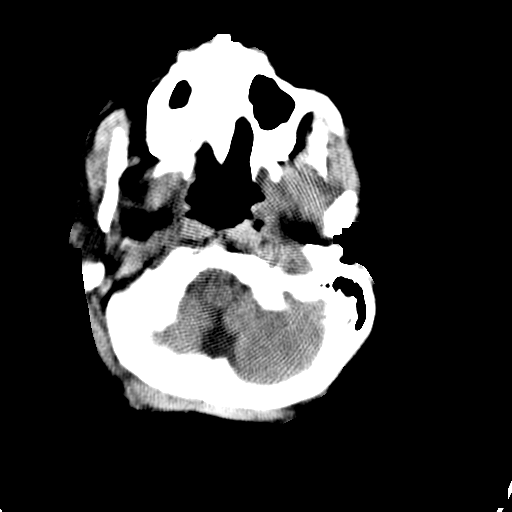
[im 7/32  brain]
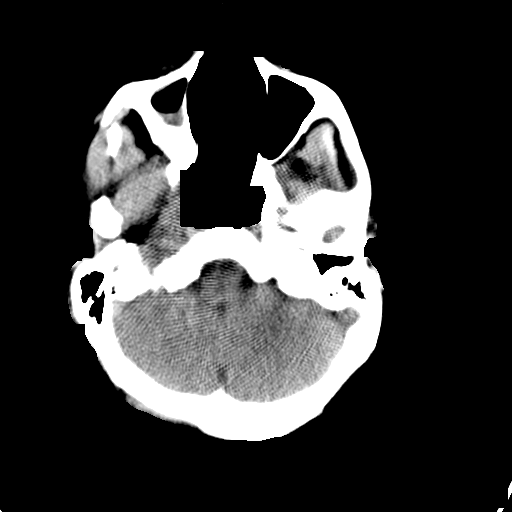
[im 9/32  brain]
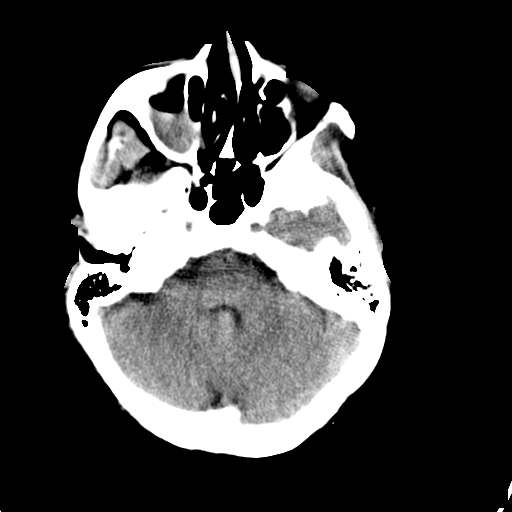
[im 12/32  brain]
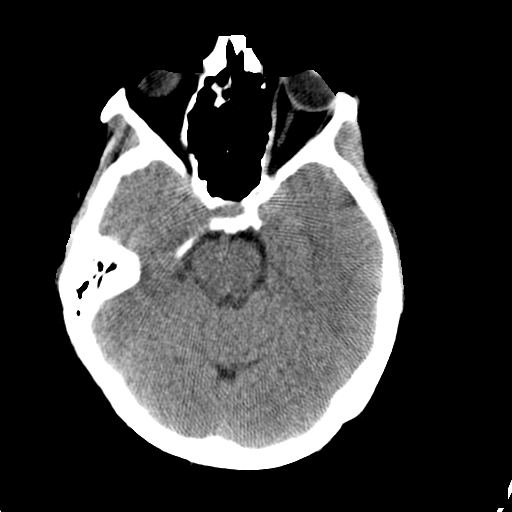
[im 12/32  bone]
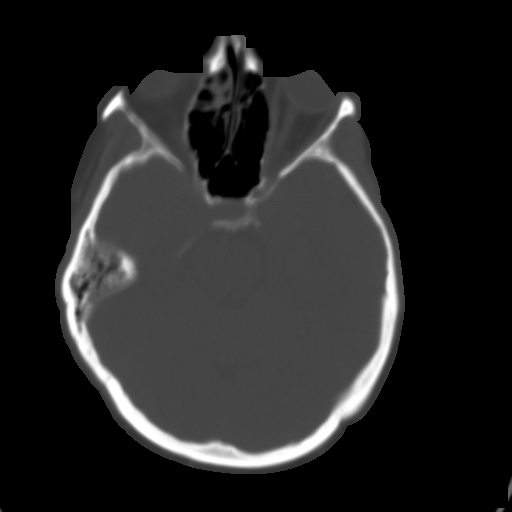
[im 14/32  brain]
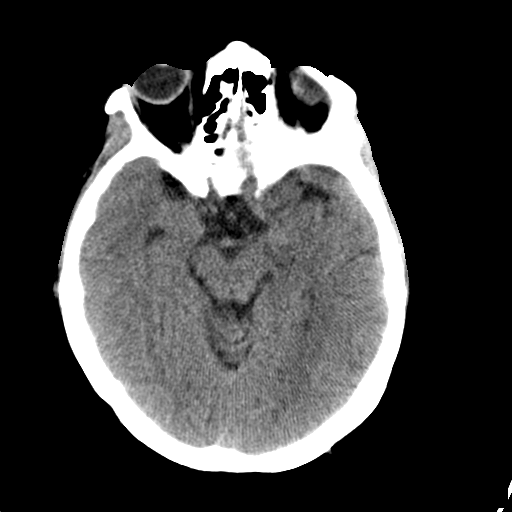
[im 16/32  brain]
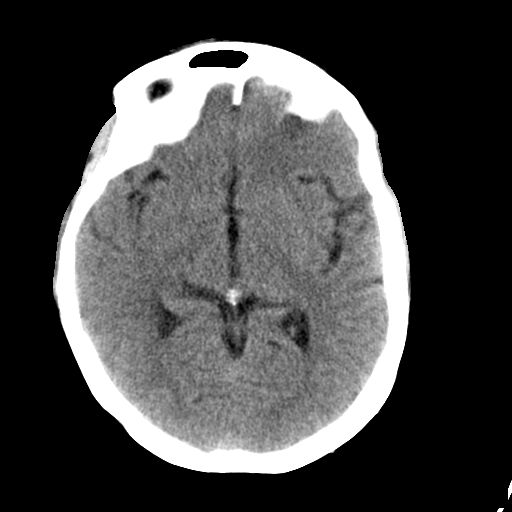
[im 18/32  brain]
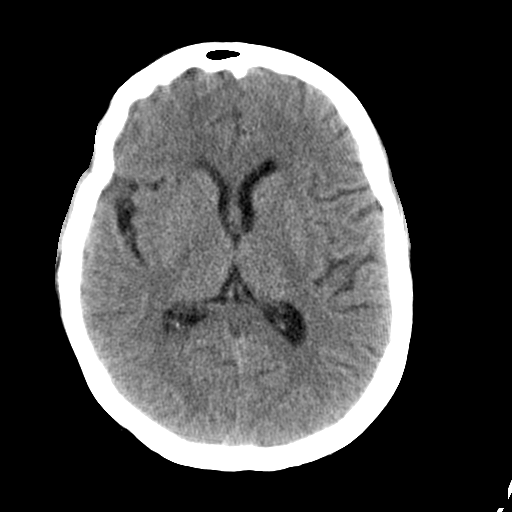
[im 20/32  brain]
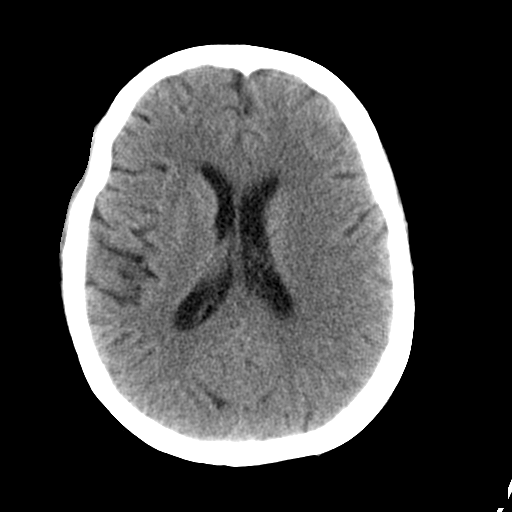
[im 20/32  bone]
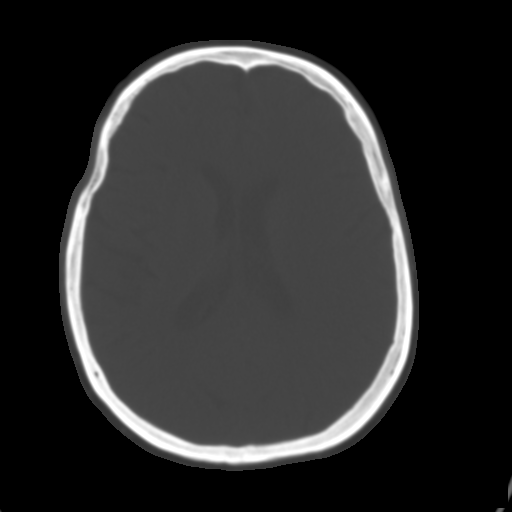
[im 23/32  brain]
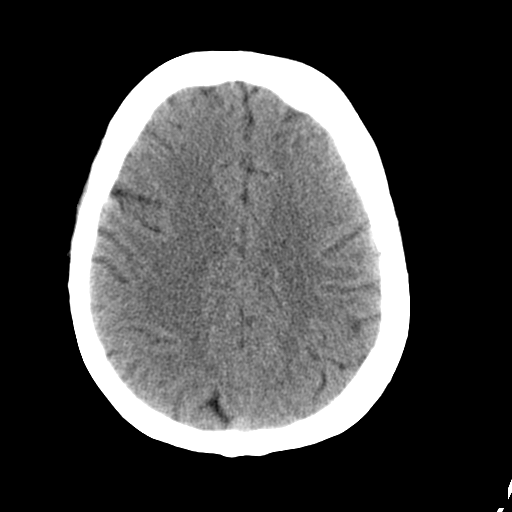
[im 25/32  brain]
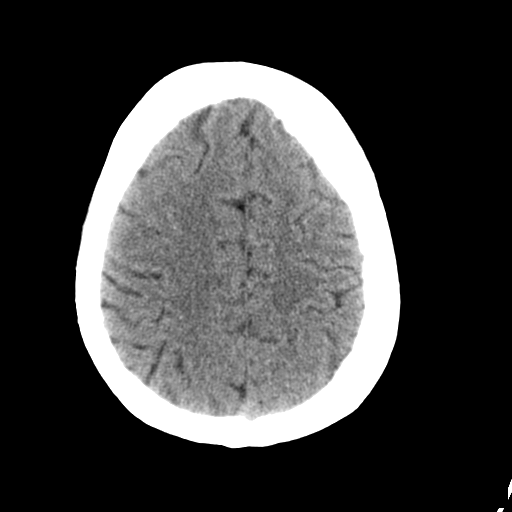
[im 27/32  brain]
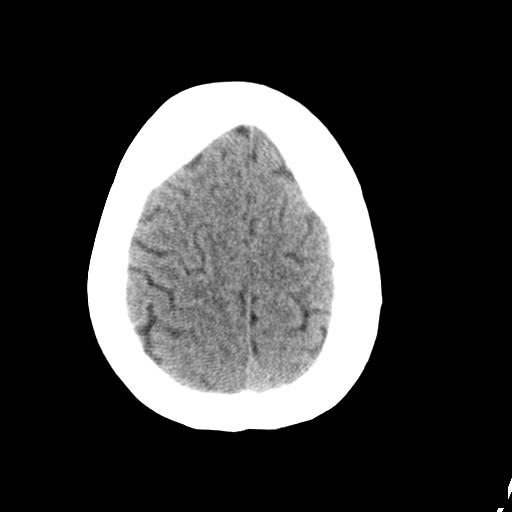
[im 29/32  brain]
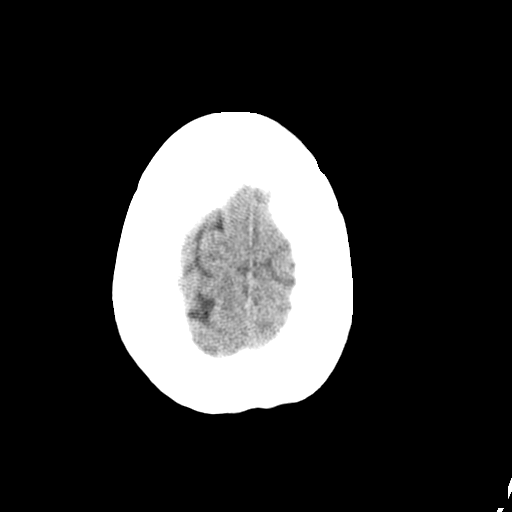
[im 29/32  bone]
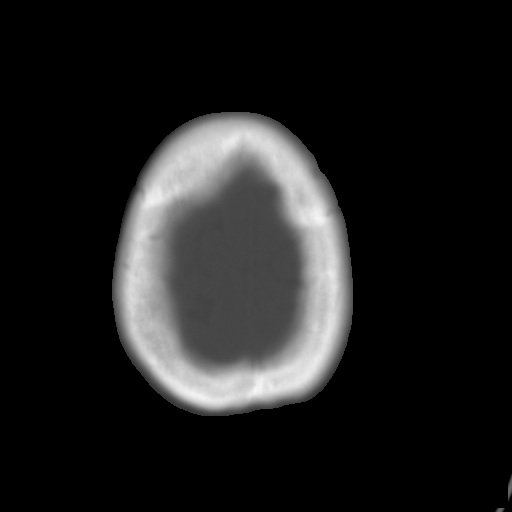

[Series 3: bone · axial · 0.40mm/px · z∈[+890,+936]mm · 3 of 32 slices shown]
[im 3/32  bone]
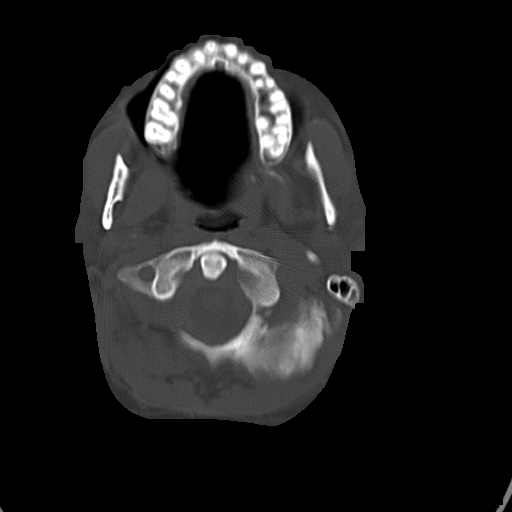
[im 7/32  bone]
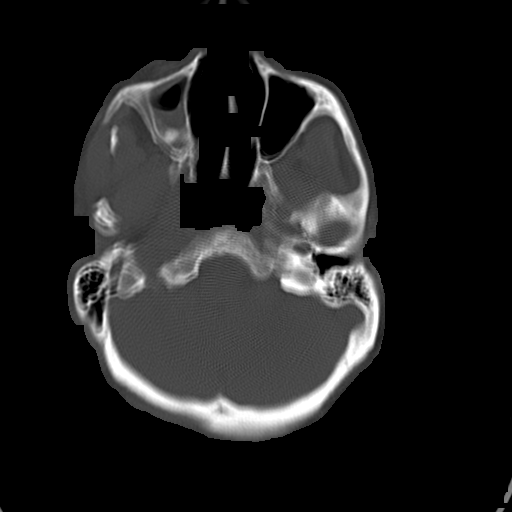
[im 12/32  bone]
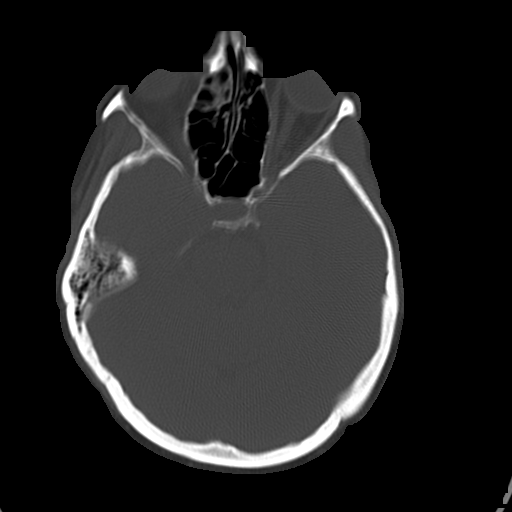

[16 of 30 positions shown; findings below may reference images not displayed]

FINDINGS: There is no evidence for mass effect, midline shift, or extra-axial fluid
collections.  There is no evidence for space-occupying lesion or
intracranial hemorrhage. There is no evidence for a cortical-based area of
acute infarction.

Ventricles and sulci are appropriate for the patient's age. The basal
cisterns are patent.

Visualized portions of the orbits are unremarkable. There is near complete
opacification of the right maxillary sinus.

The osseous structures are unremarkable.
IMPRESSION: No acute intracranial process.

## 2008-12-26 IMAGING — CR DG CHEST 1V PORT
1 series · 1 of 1 positions shown · non-contrast
Comparison: none

REASON FOR EXAM: dka, unresponsive
COMMENTS:

[view not recorded]
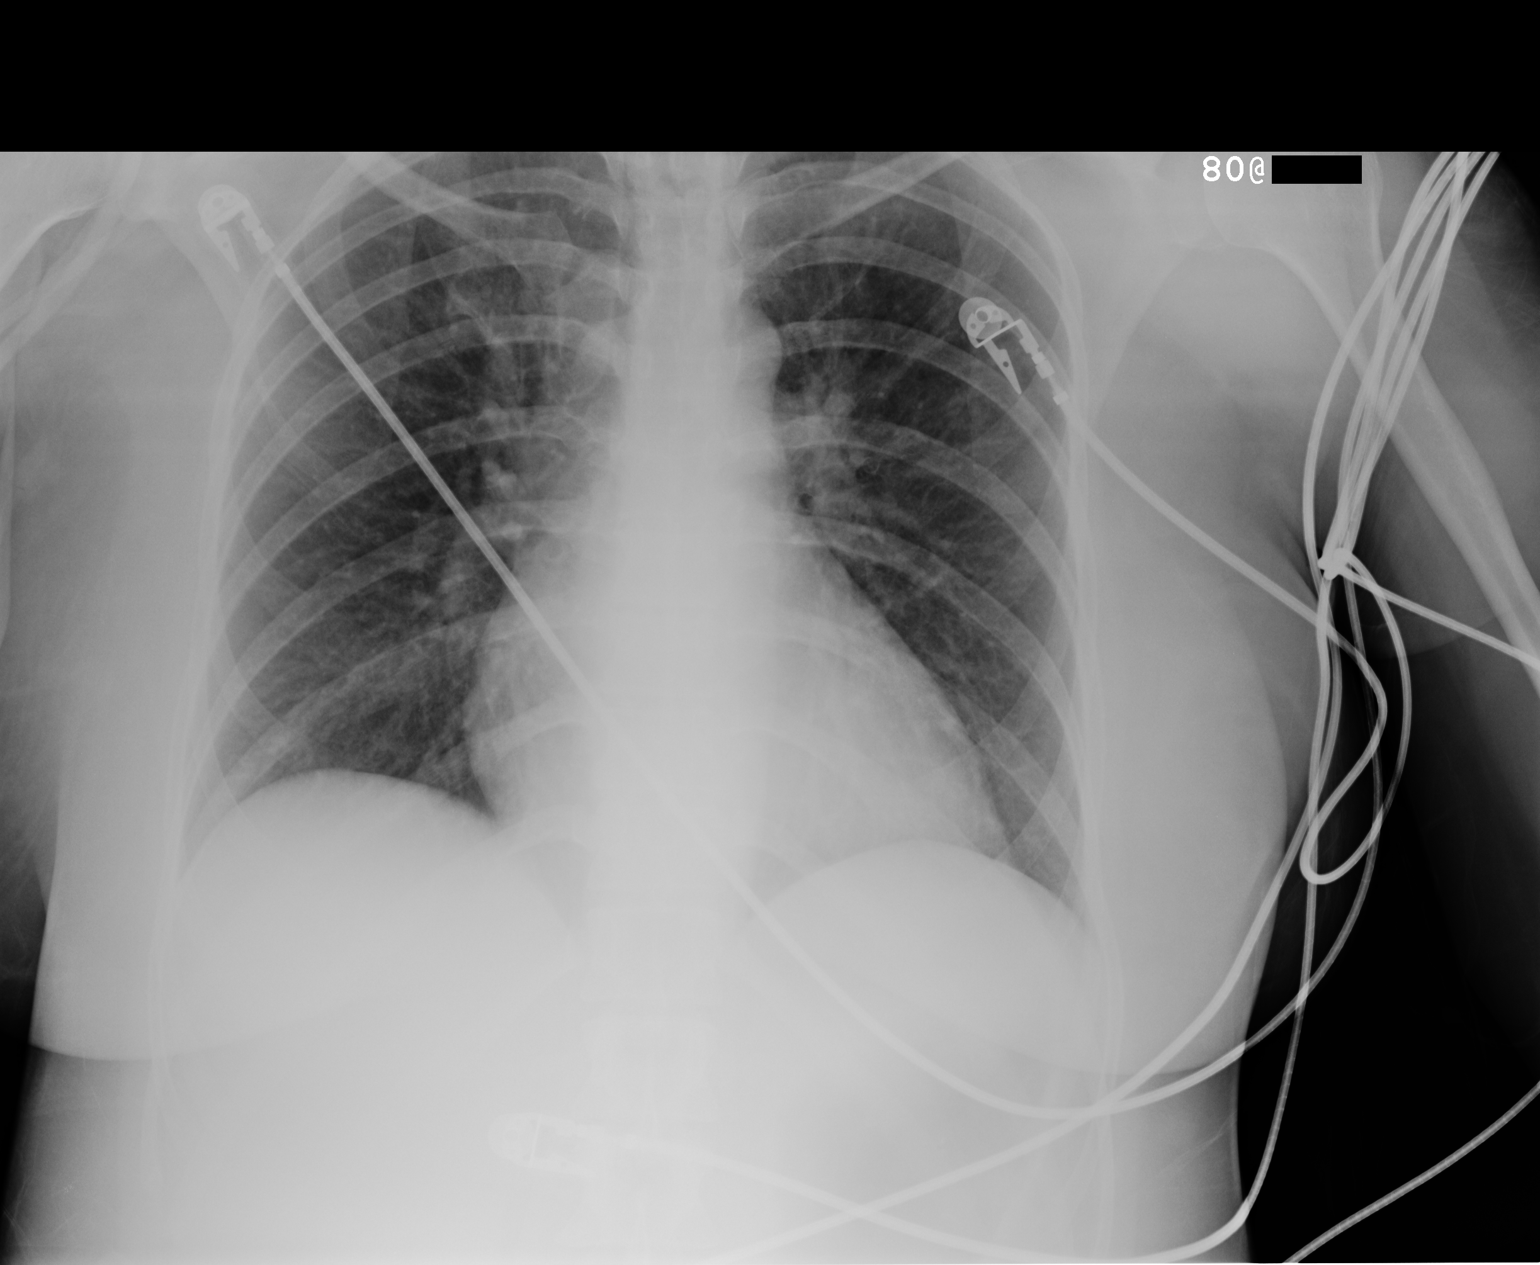

[1 of 1 positions shown; findings below may reference images not displayed]

PROCEDURE:     DXR - DXR PORTABLE CHEST SINGLE VIEW  - [DATE]  [DATE]

RESULT:     Comparison is made to the study of [DATE]. Cardiac
monitoring electrodes are present.

The lungs are clear. The heart and pulmonary vessels are normal. The bony
and mediastinal structures are unremarkable. There is no effusion. There is
no pneumothorax or evidence of congestive failure.
IMPRESSION: No acute cardiopulmonary disease.

## 2008-12-29 ENCOUNTER — Inpatient Hospital Stay: Payer: Self-pay | Admitting: Psychiatry

## 2009-01-14 ENCOUNTER — Ambulatory Visit: Payer: Self-pay | Admitting: Unknown Physician Specialty

## 2009-02-03 ENCOUNTER — Inpatient Hospital Stay: Payer: Self-pay | Admitting: *Deleted

## 2009-02-03 IMAGING — CT CT HEAD WITHOUT CONTRAST
2 series · 16 of 30 positions shown, 20 images · non-contrast
Comparison: none

REASON FOR EXAM: change in mental status
COMMENTS:

[Series 2: without · axial · non-contrast · 0.41mm/px · z∈[+1035,+1155]mm · 13 of 30 slices shown, 17 images]
[im 3/30  brain]
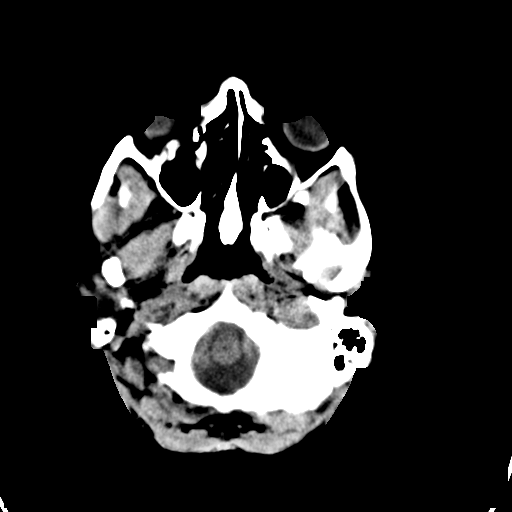
[im 3/30  bone]
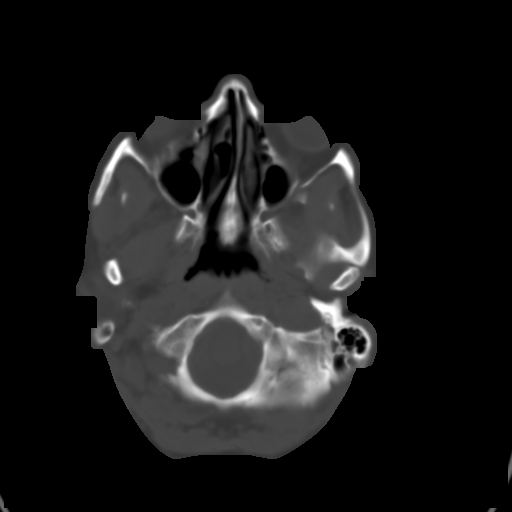
[im 5/30  brain]
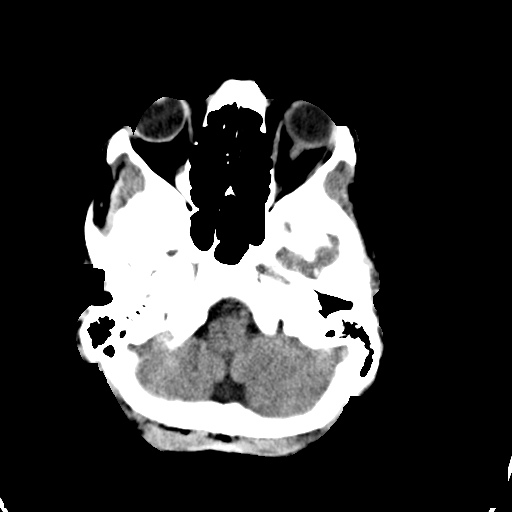
[im 7/30  brain]
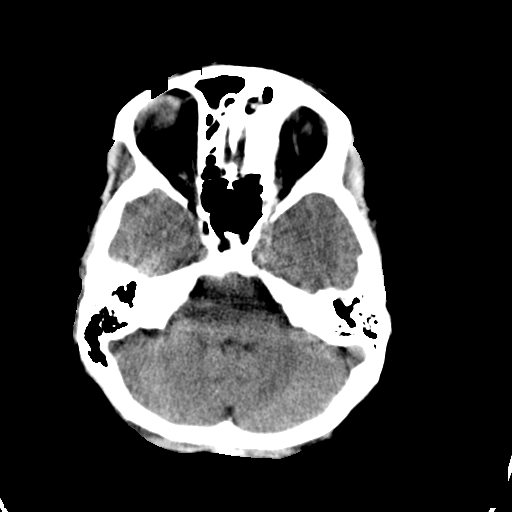
[im 9/30  brain]
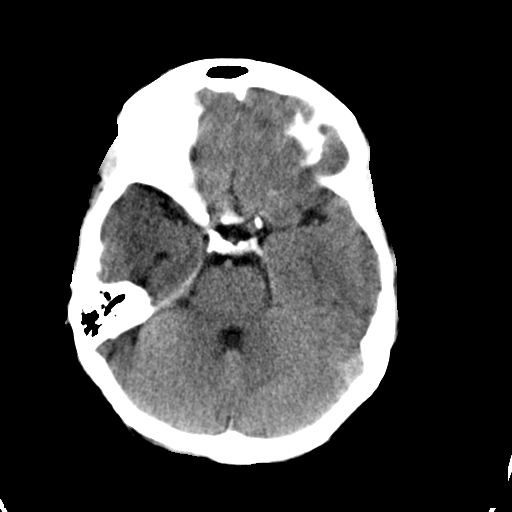
[im 11/30  brain]
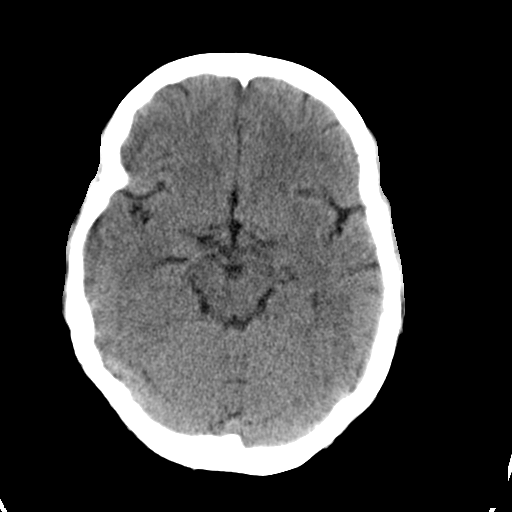
[im 11/30  bone]
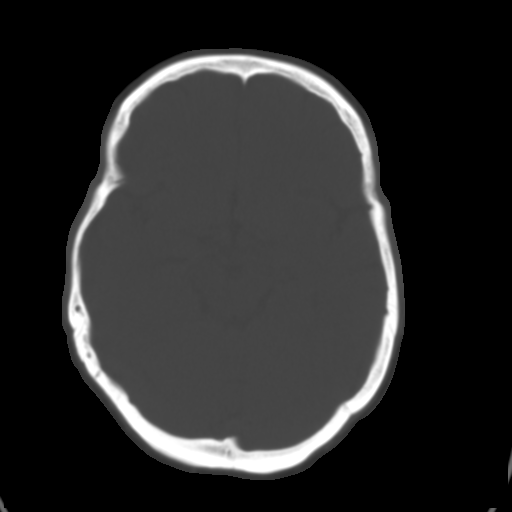
[im 13/30  brain]
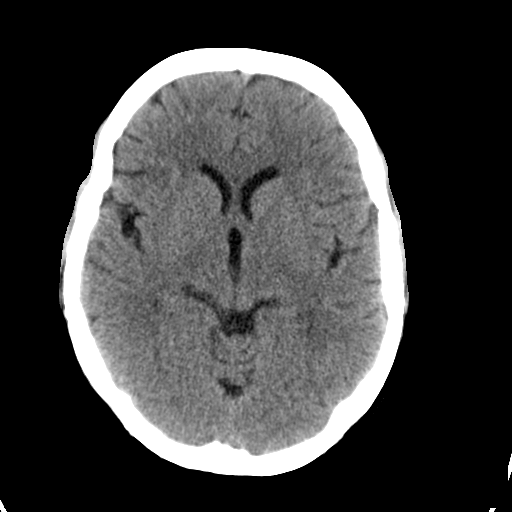
[im 15/30  brain]
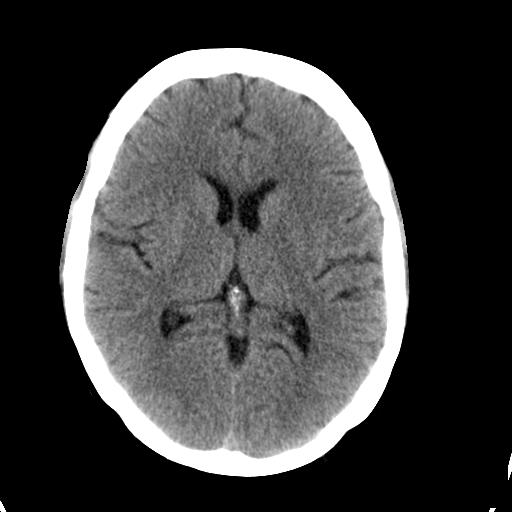
[im 17/30  brain]
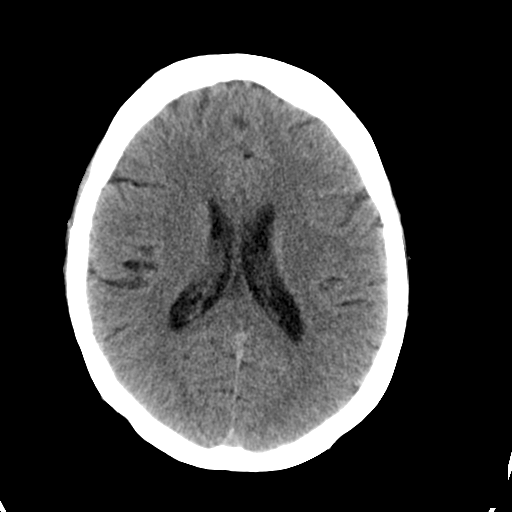
[im 19/30  brain]
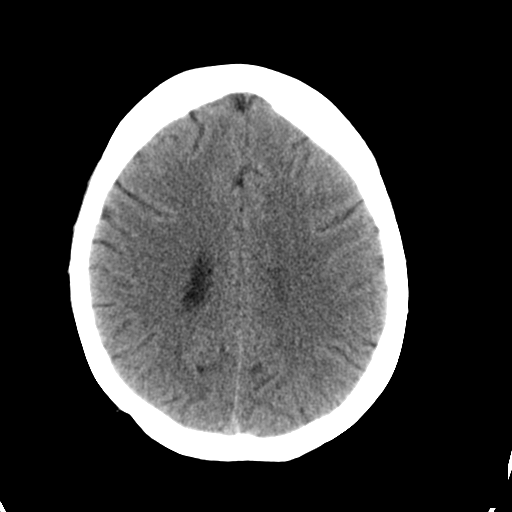
[im 19/30  bone]
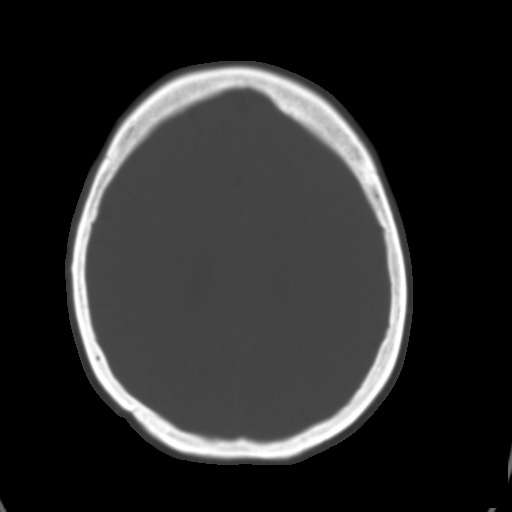
[im 21/30  brain]
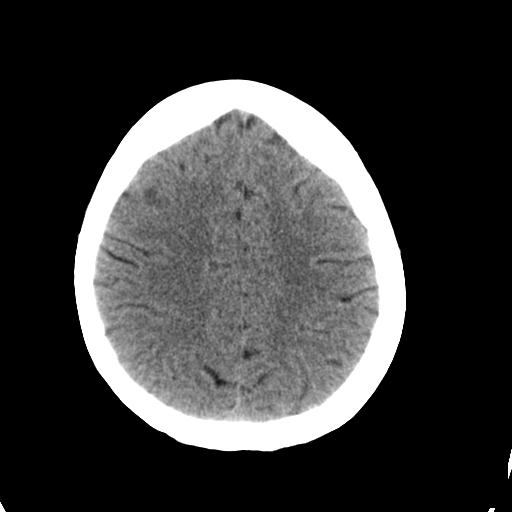
[im 23/30  brain]
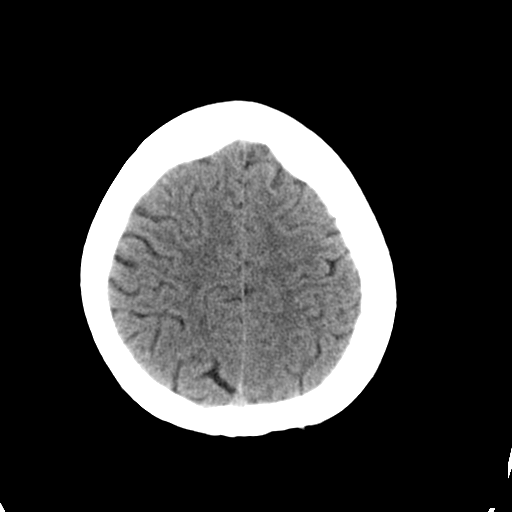
[im 25/30  brain]
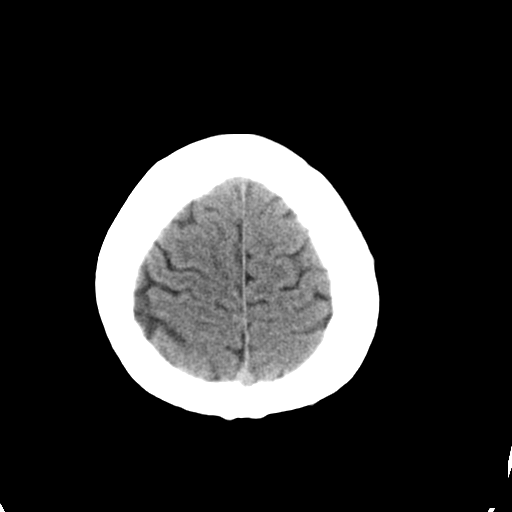
[im 27/30  brain]
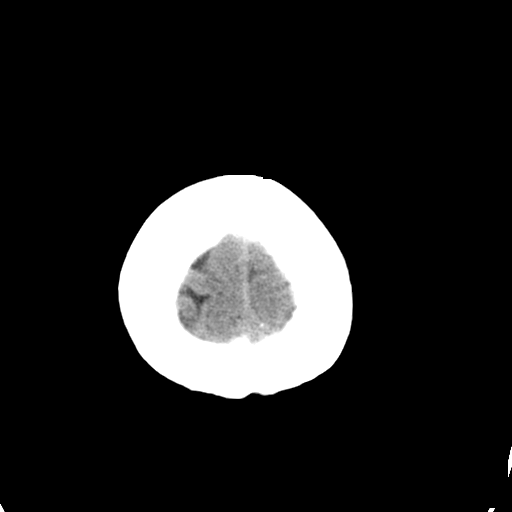
[im 27/30  bone]
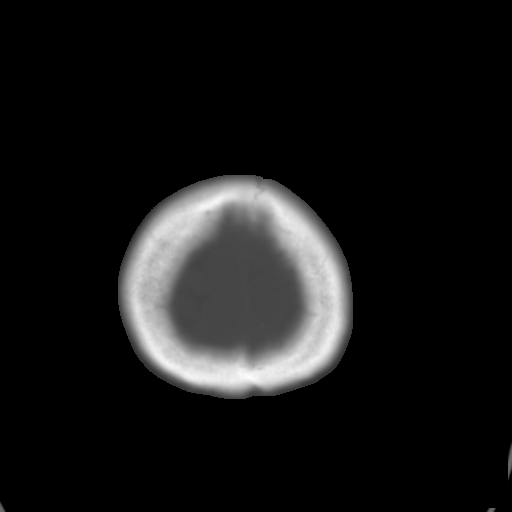

[Series 3: bone · axial · 0.41mm/px · z∈[+1035,+1075]mm · 3 of 30 slices shown]
[im 3/30  bone]
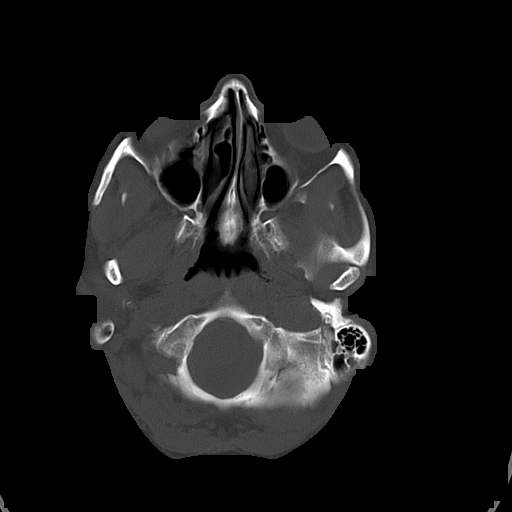
[im 7/30  bone]
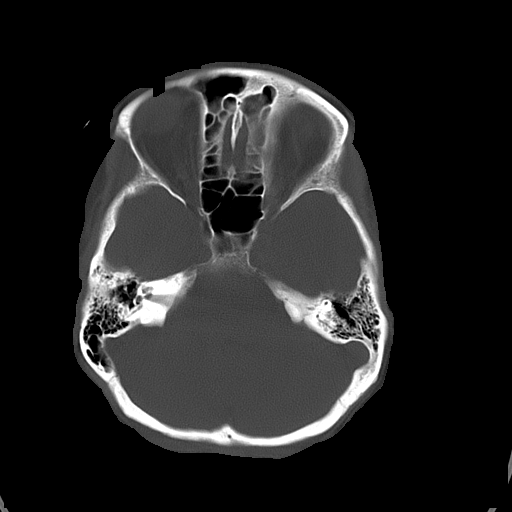
[im 11/30  bone]
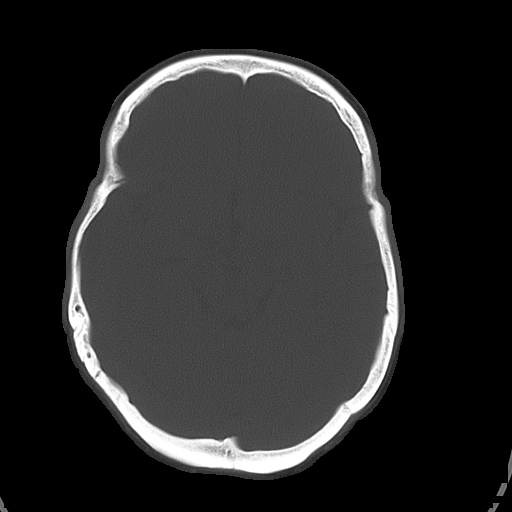

[16 of 30 positions shown; findings below may reference images not displayed]

PROCEDURE:     CT  - CT HEAD WITHOUT CONTRAST  - [DATE]  [DATE]

RESULT:     Axial noncontrast CT scanning was performed through the brain at
5 mm intervals and slice thicknesses. Comparison made to the study [DATE].

The ventricles are normal in size and position. There is no intracranial
hemorrhage nor mass effect. There are no findings suspicious for an evolving
ischemic infarction. The cerebellum and brainstem are normal in density. The
observed portions of the paranasal sinuses exhibit no air fluid levels.
There is no evidence of an acute skull fracture.
IMPRESSION: I see no acute intracranial abnormality.

A preliminary report was sent to the [HOSPITAL] the conclusion
of the study.

## 2009-02-03 IMAGING — CR DG CHEST 1V PORT
1 series · 1 of 1 positions shown · non-contrast
Comparison: none

REASON FOR EXAM: SOB
COMMENTS:

PROCEDURE:     DXR - DXR PORTABLE CHEST SINGLE VIEW  - [DATE]  [DATE]
RESULT:      Comparison is made to a prior exam of [DATE]. The lung
fields are clear. The heart, mediastinal and osseous structures reveal no
significant abnormalities.

[view not recorded]
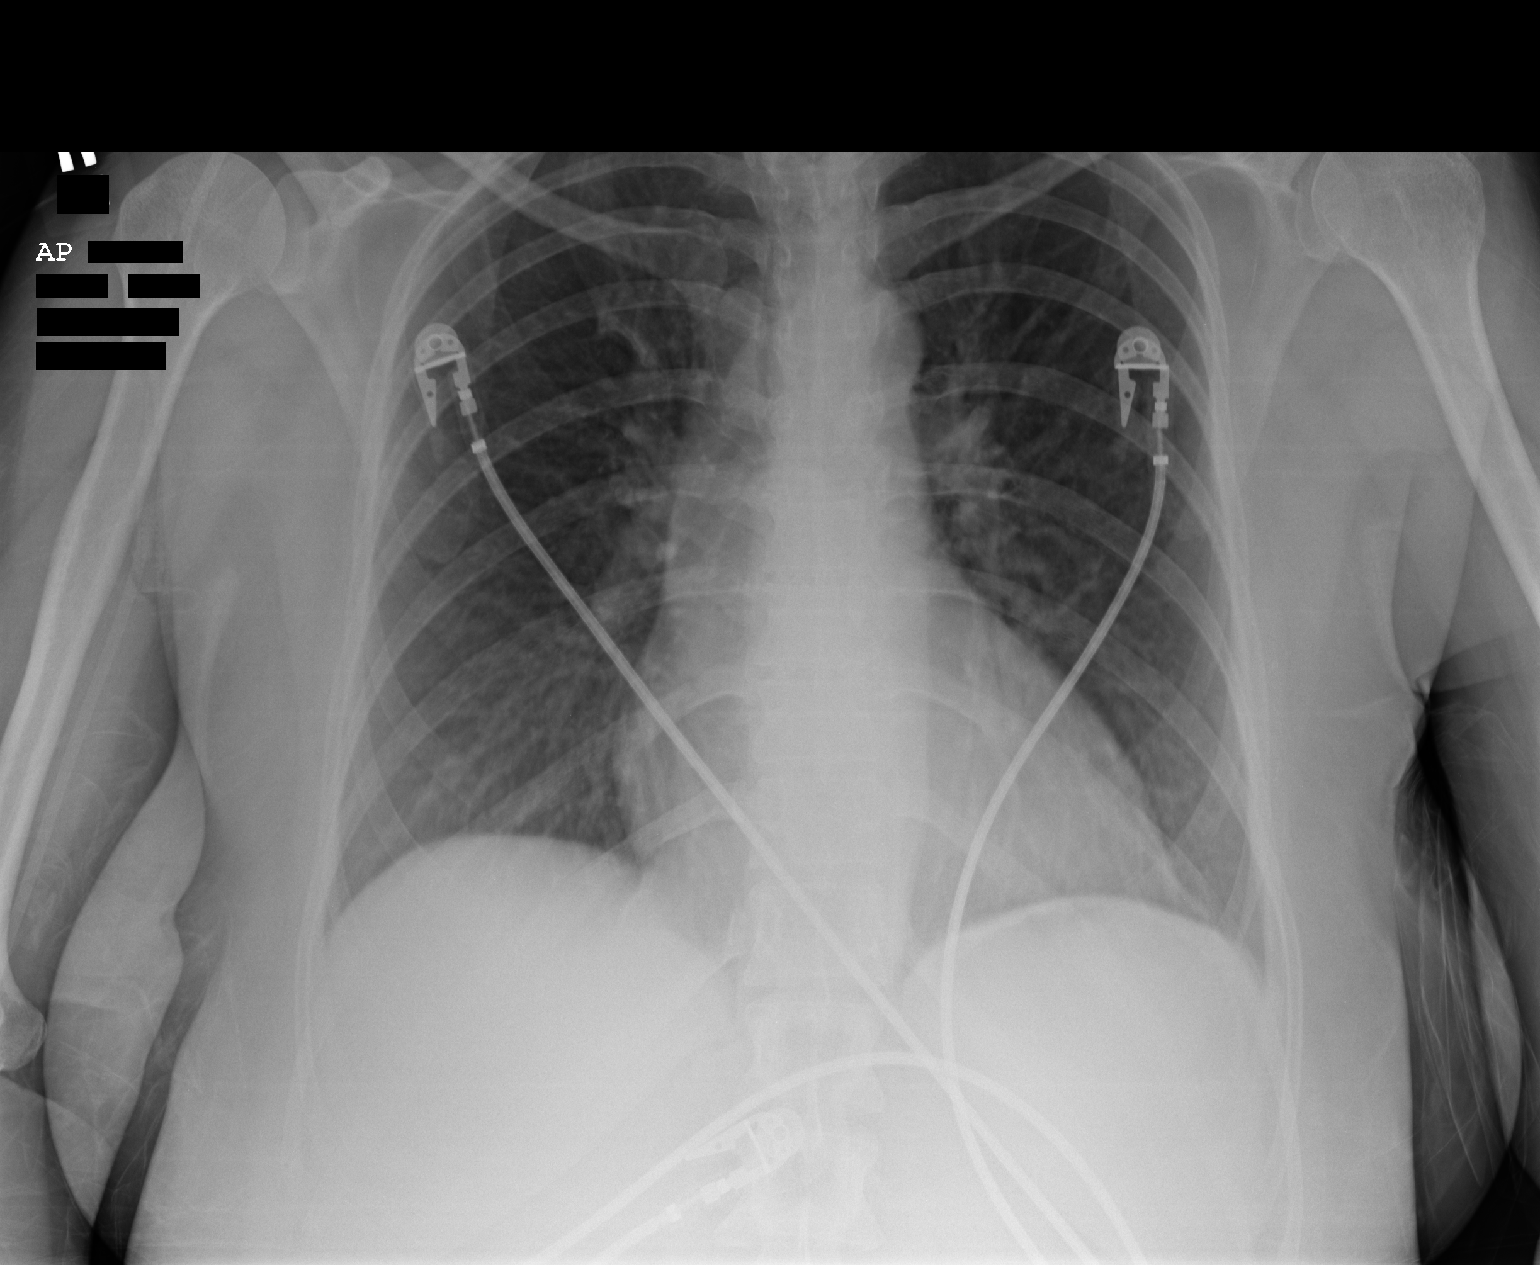

[1 of 1 positions shown; findings below may reference images not displayed]

IMPRESSION: 1. No acute changes are identified.
2. Monitoring electrodes are present.

## 2009-02-23 ENCOUNTER — Inpatient Hospital Stay: Payer: Self-pay | Admitting: *Deleted

## 2009-02-23 IMAGING — CR DG CHEST 1V PORT
1 series · 1 of 1 positions shown · non-contrast
Comparison: none

REASON FOR EXAM: sepsis
COMMENTS:

PROCEDURE:     DXR - DXR PORTABLE CHEST SINGLE VIEW  - [DATE]  [DATE]
RESULT:     Comparison is made to a prior exam of [DATE]. The lung fields
are clear. The heart, mediastinal and osseous structures show no acute
changes. Monitoring electrodes are present.

[view not recorded]
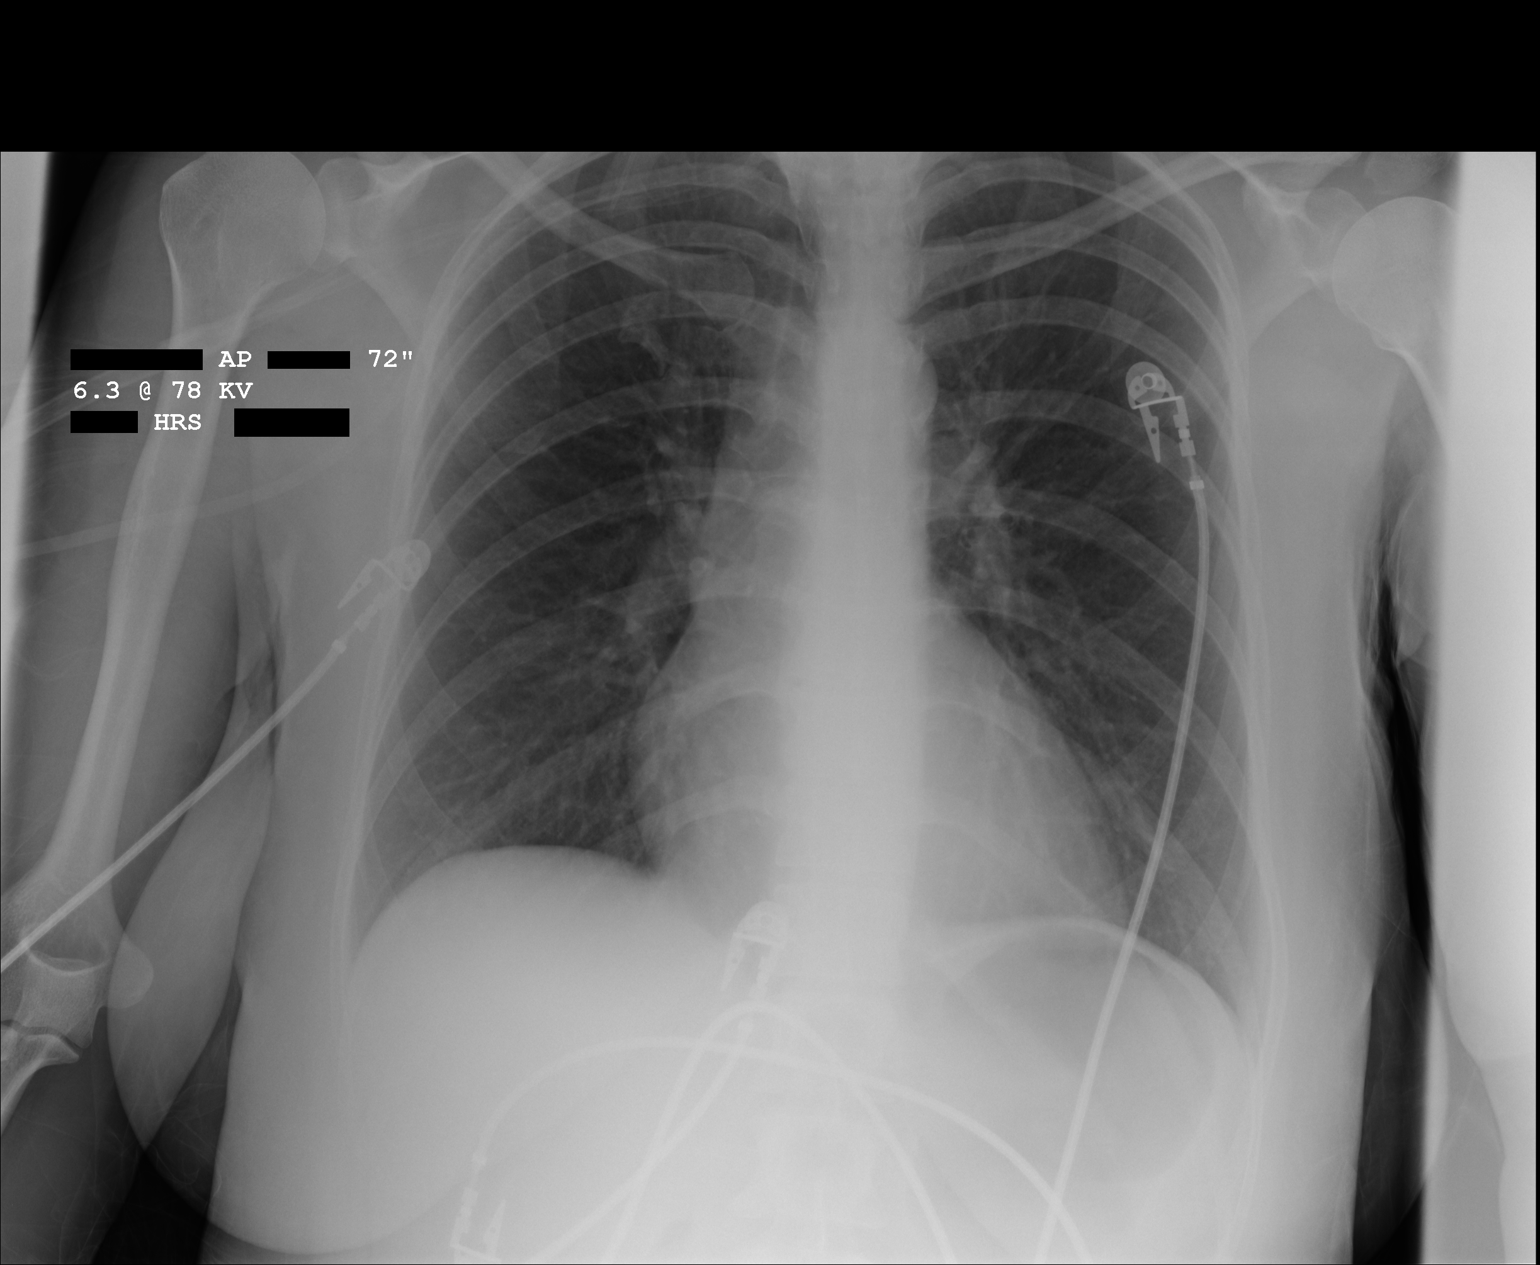

[1 of 1 positions shown; findings below may reference images not displayed]

IMPRESSION: No acute changes are identified.

## 2009-04-07 ENCOUNTER — Emergency Department: Payer: Self-pay

## 2009-04-30 ENCOUNTER — Inpatient Hospital Stay: Payer: Self-pay | Admitting: Internal Medicine
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
## 2009-04-30 IMAGING — CR DG CHEST 1V PORT
1 series · 1 of 1 positions shown · non-contrast
Comparison: none

REASON FOR EXAM: pna
COMMENTS:

PROCEDURE:     DXR - DXR PORTABLE CHEST SINGLE VIEW  - [DATE]  [DATE]
RESULT:     Comparison: [DATE]

[view not recorded]
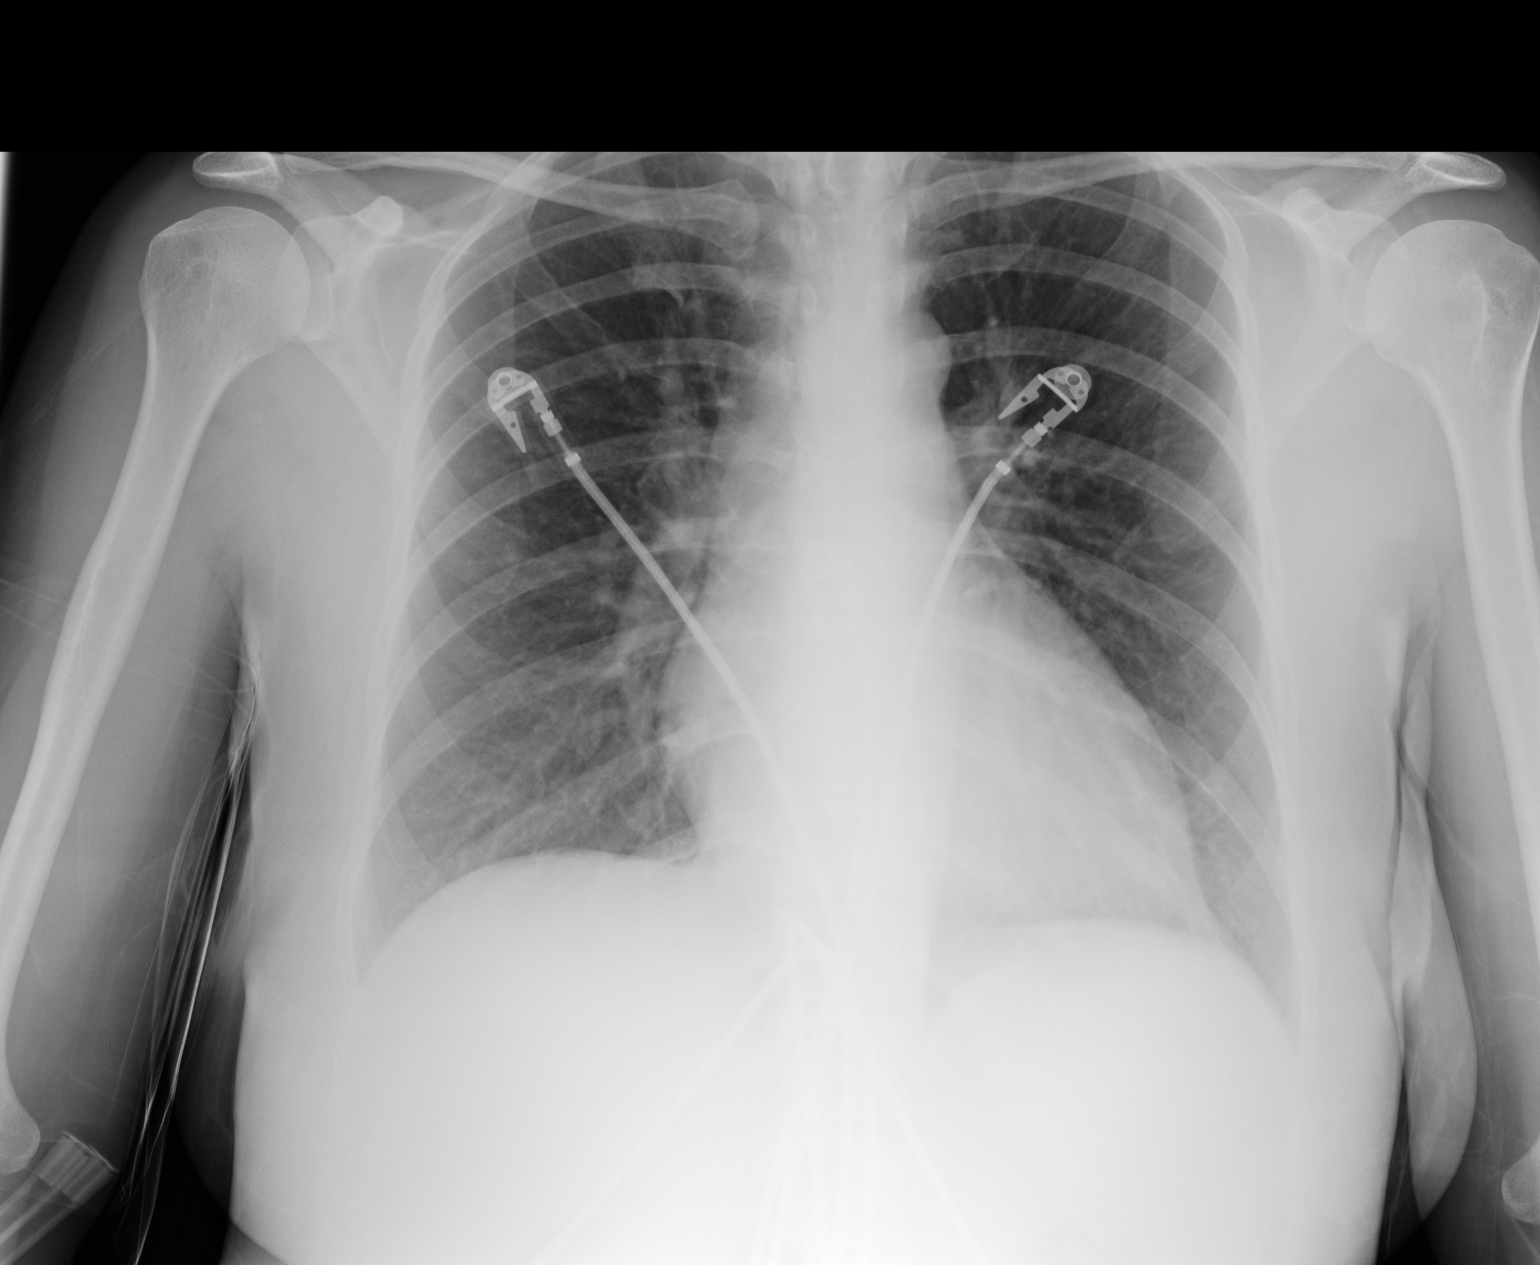

[1 of 1 positions shown; findings below may reference images not displayed]

FINDINGS: Single portable AP chest radiograph is provided. There is no focal
parenchymal opacity, pleural effusion, or pneumothorax. Normal
cardiomediastinal silhouette. The osseous structures are unremarkable.
IMPRESSION: No acute disease of the chest.

## 2009-05-20 ENCOUNTER — Emergency Department: Payer: Self-pay | Admitting: Emergency Medicine

## 2009-05-21 IMAGING — CT CT HEAD WITHOUT CONTRAST
2 series · 16 of 30 positions shown, 20 images · non-contrast
Comparison: none

REASON FOR EXAM: headache
COMMENTS:

PROCEDURE:     CT  - CT HEAD WITHOUT CONTRAST  - [DATE]  [DATE]
RESULT:
HISTORY: Headache.

[Series 2: without · axial · non-contrast · 0.38mm/px · z∈[+1170,+1290]mm · 13 of 29 slices shown, 17 images]
[im 3/29  brain]
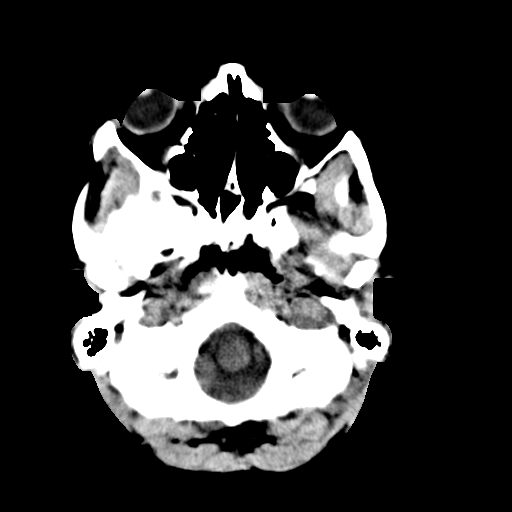
[im 3/29  bone]
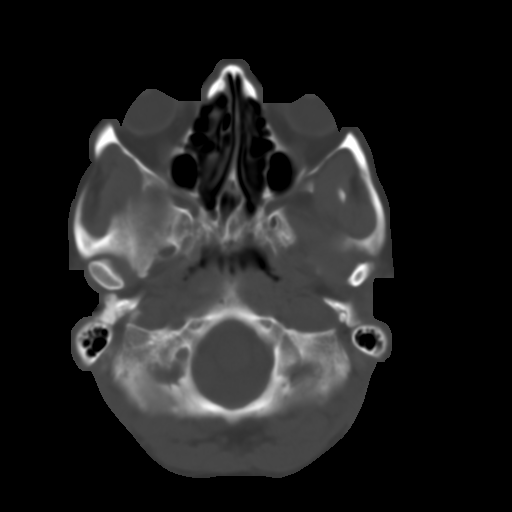
[im 5/29  brain]
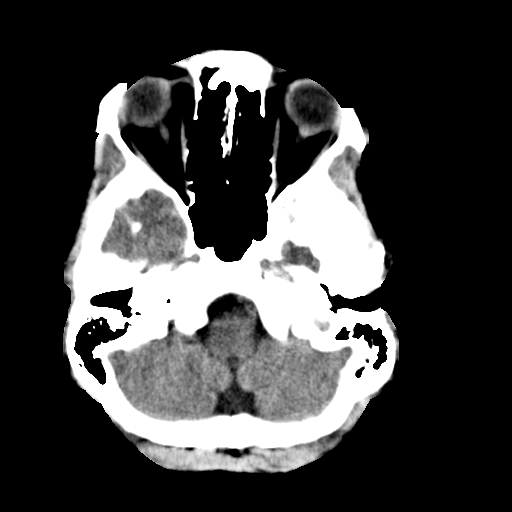
[im 7/29  brain]
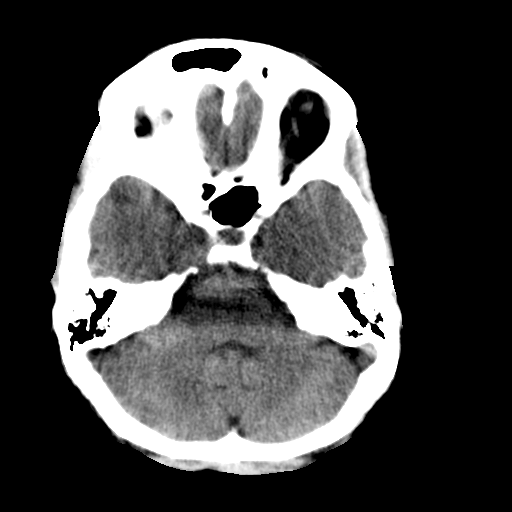
[im 9/29  brain]
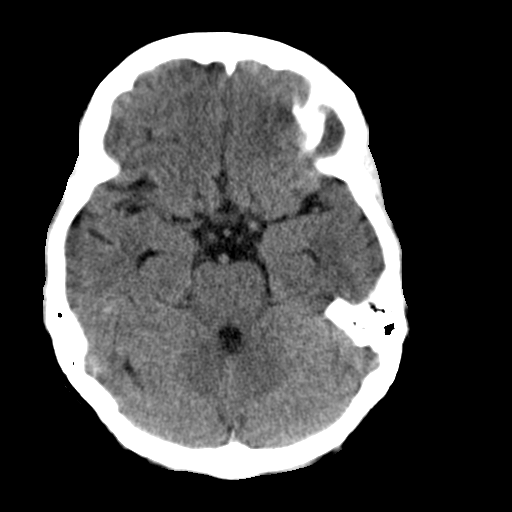
[im 11/29  brain]
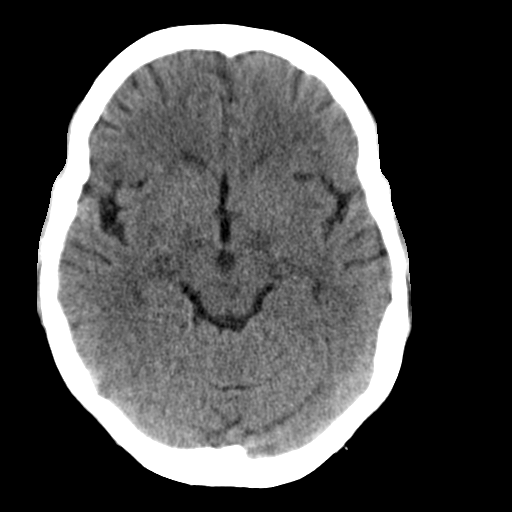
[im 11/29  bone]
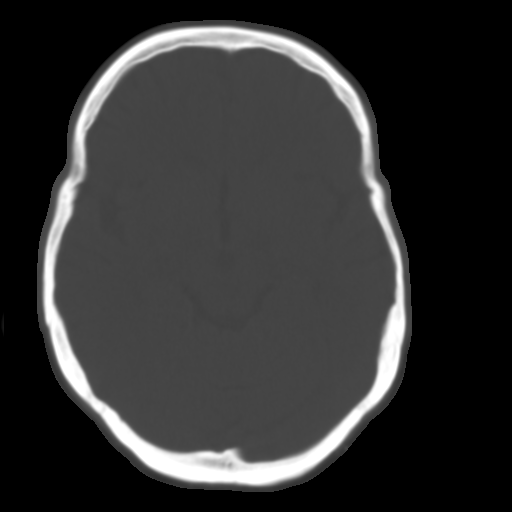
[im 13/29  brain]
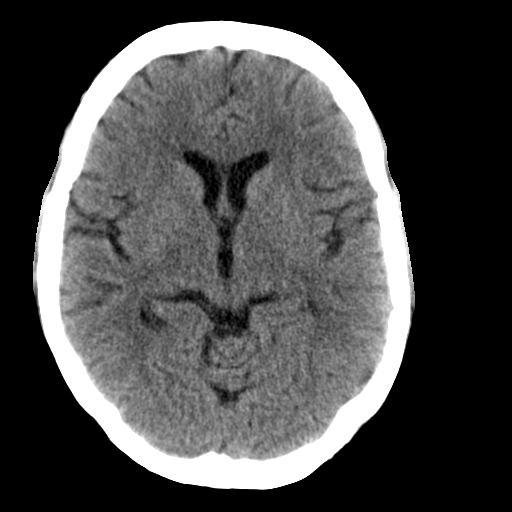
[im 15/29  brain]
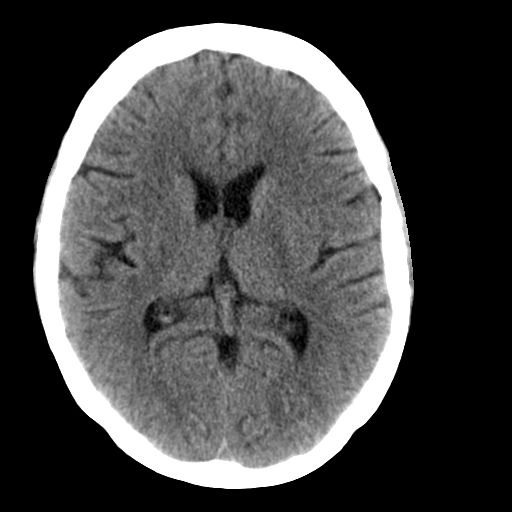
[im 17/29  brain]
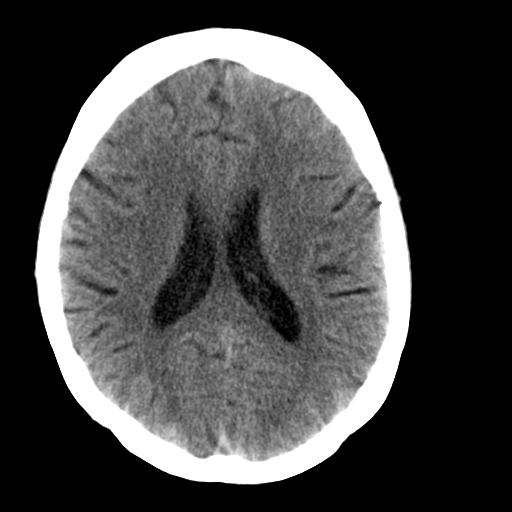
[im 19/29  brain]
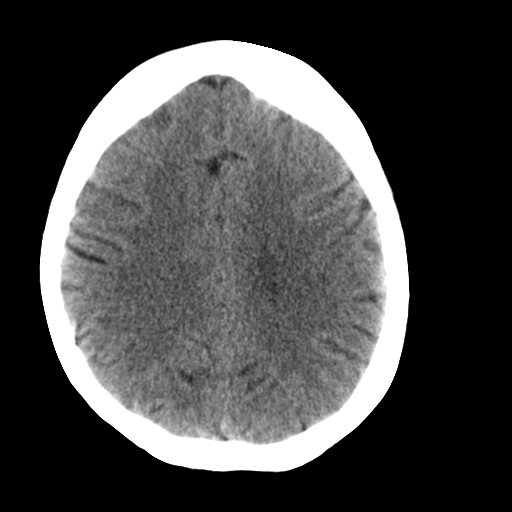
[im 19/29  bone]
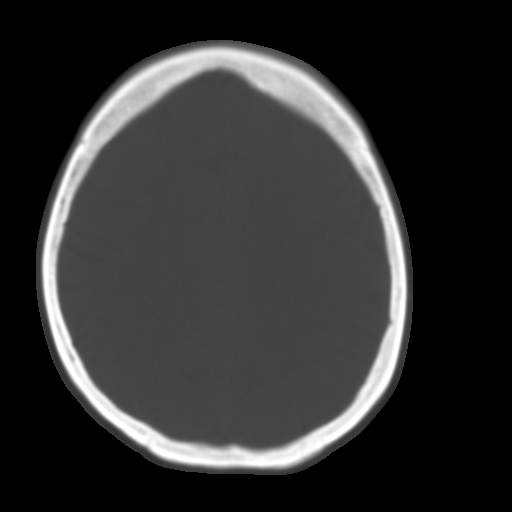
[im 21/29  brain]
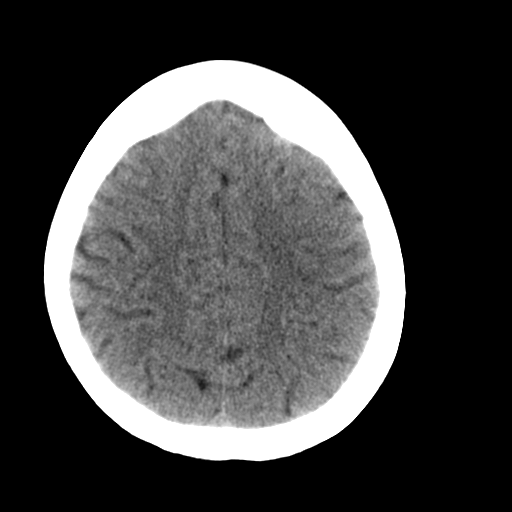
[im 23/29  brain]
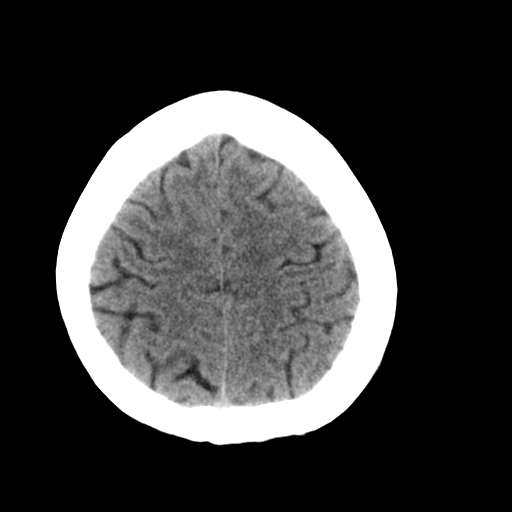
[im 25/29  brain]
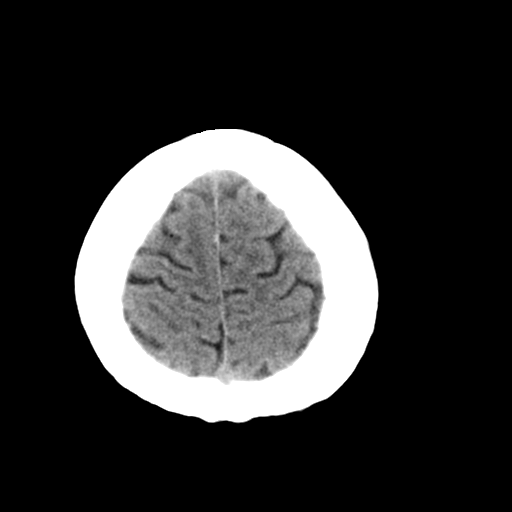
[im 27/29  brain]
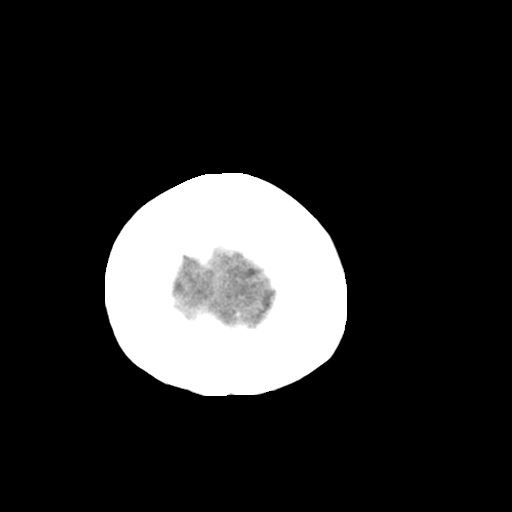
[im 27/29  bone]
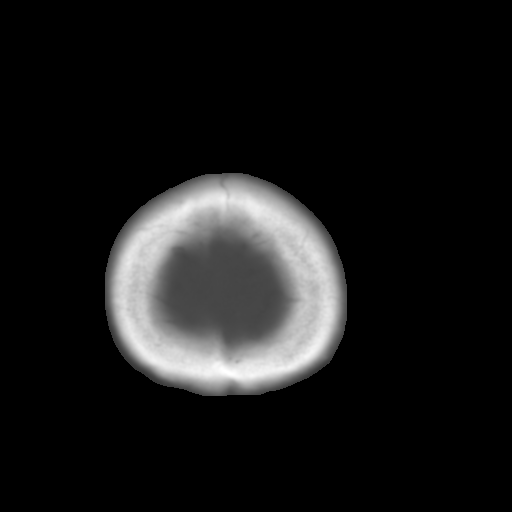

[Series 3: bone · axial · 0.38mm/px · z∈[+1170,+1210]mm · 3 of 29 slices shown]
[im 3/29  bone]
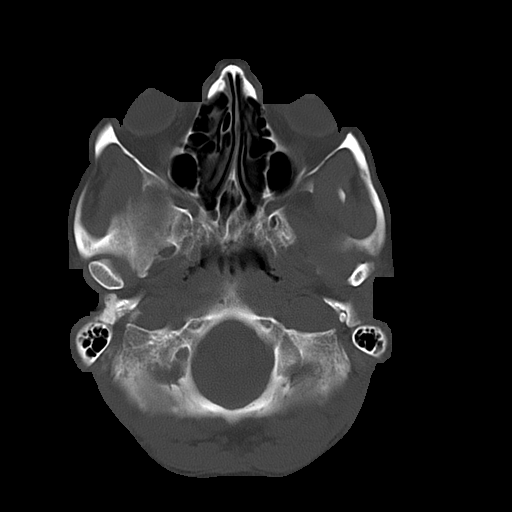
[im 7/29  bone]
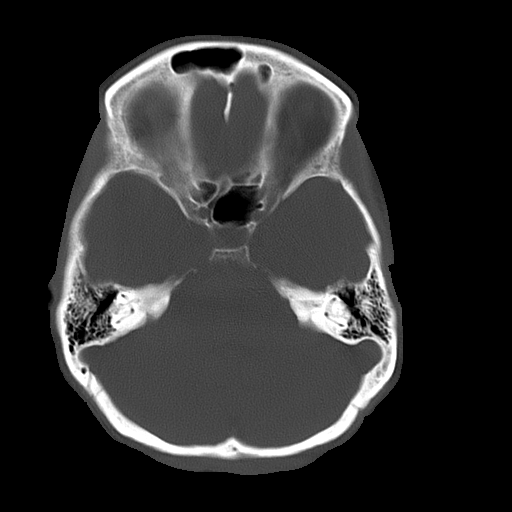
[im 11/29  bone]
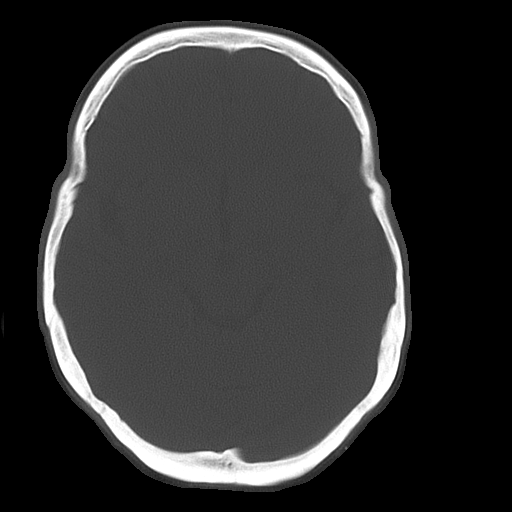

[16 of 30 positions shown; findings below may reference images not displayed]

COMPARISON STUDY: Head CT of [DATE].

PROCEDURE AND FINDINGS: Standard nonenhanced head CT obtained. No
intra-axial or extra-axial pathologic fluid or blood collections are
identified. No mass lesions are noted. No acute bony abnormalities are
identified. A mild amount of fluid is noted in the left mastoid air cells.
IMPRESSION: 1. Mild amount of fluid noted in the left mastoid air cells. Mild changes of
mastoiditis cannot be excluded.

2. No acute intracranial abnormality identified.

## 2009-05-21 IMAGING — CR DG CHEST 2V
1 series · 2 of 2 positions shown · non-contrast
Comparison: none

REASON FOR EXAM: fever aching all over
COMMENTS:

PROCEDURE:     DXR - DXR CHEST PA (OR AP) AND LATERAL  - [DATE]  [DATE]
RESULT:     Comparison is made to the prior exam of [DATE]. The lung
fields are clear. The heart, mediastinal and osseous structures show no
significant abnormalities.

[Series 1: view not recorded · 0.17mm/px · 2 of 2 slices shown]
[im 1/2]
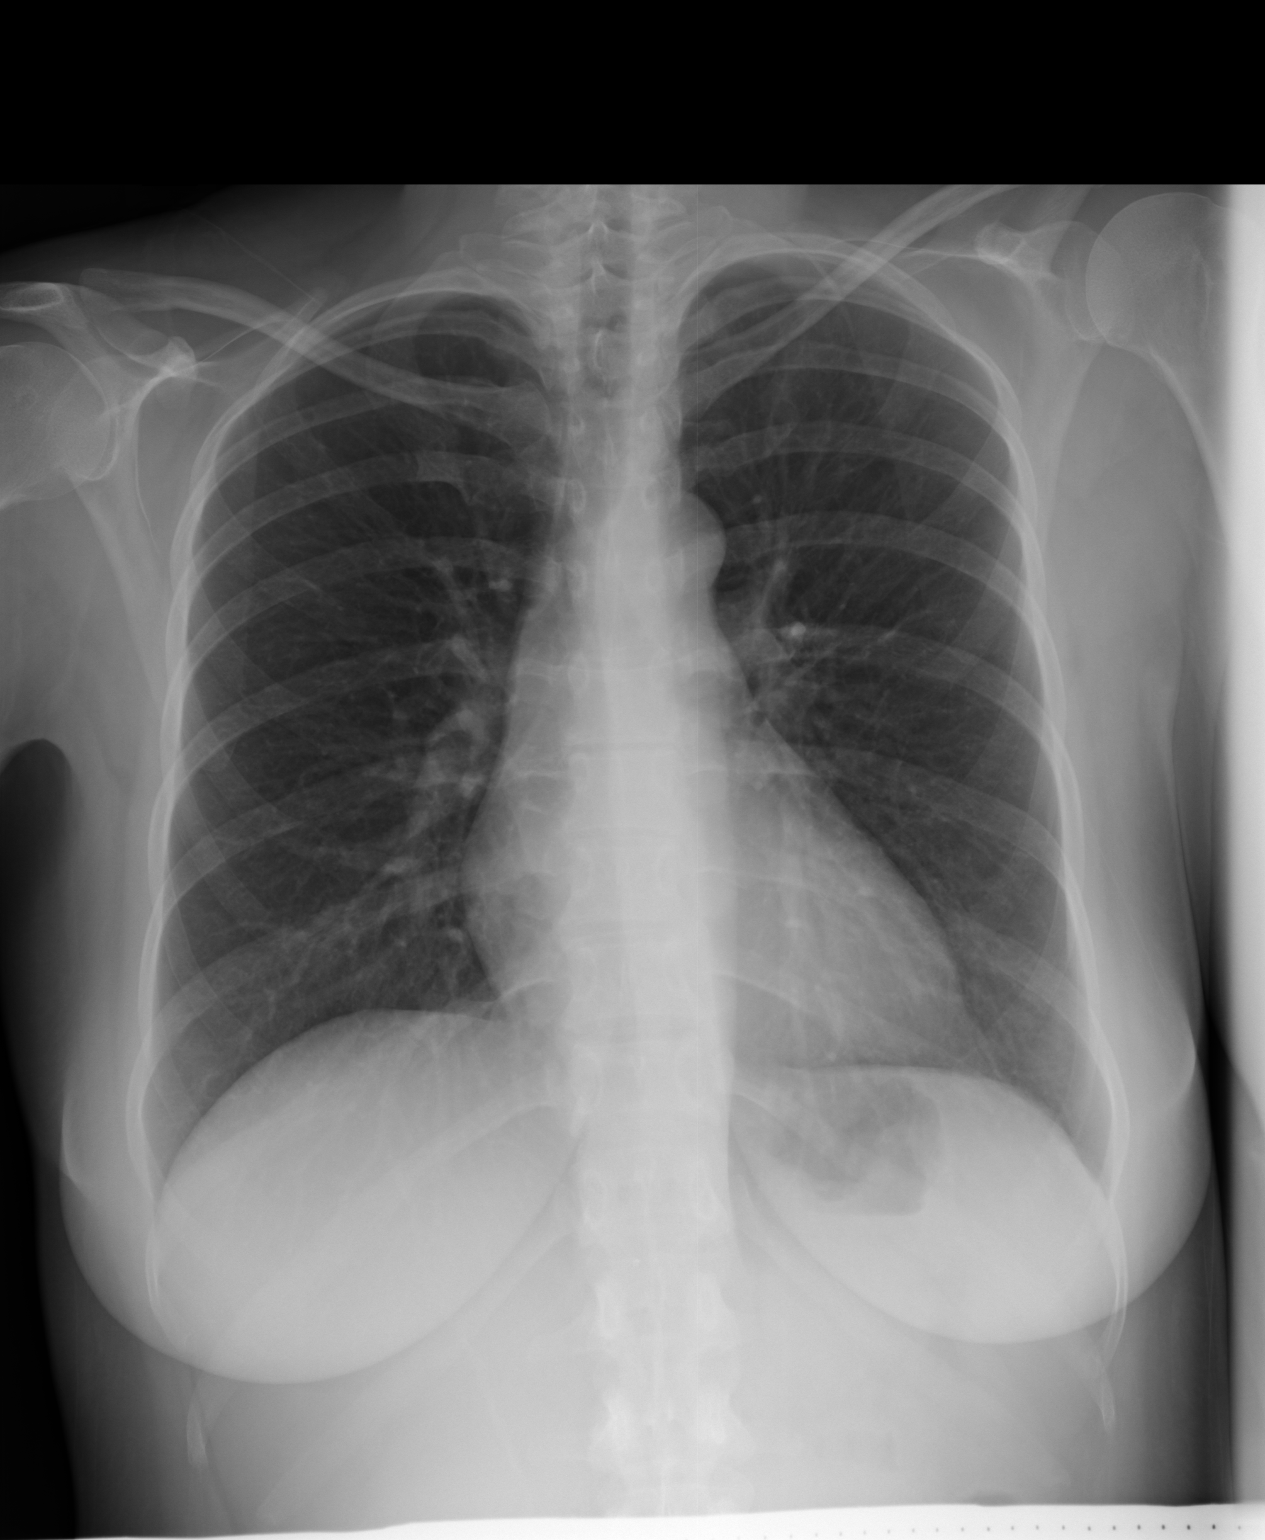
[im 2/2]
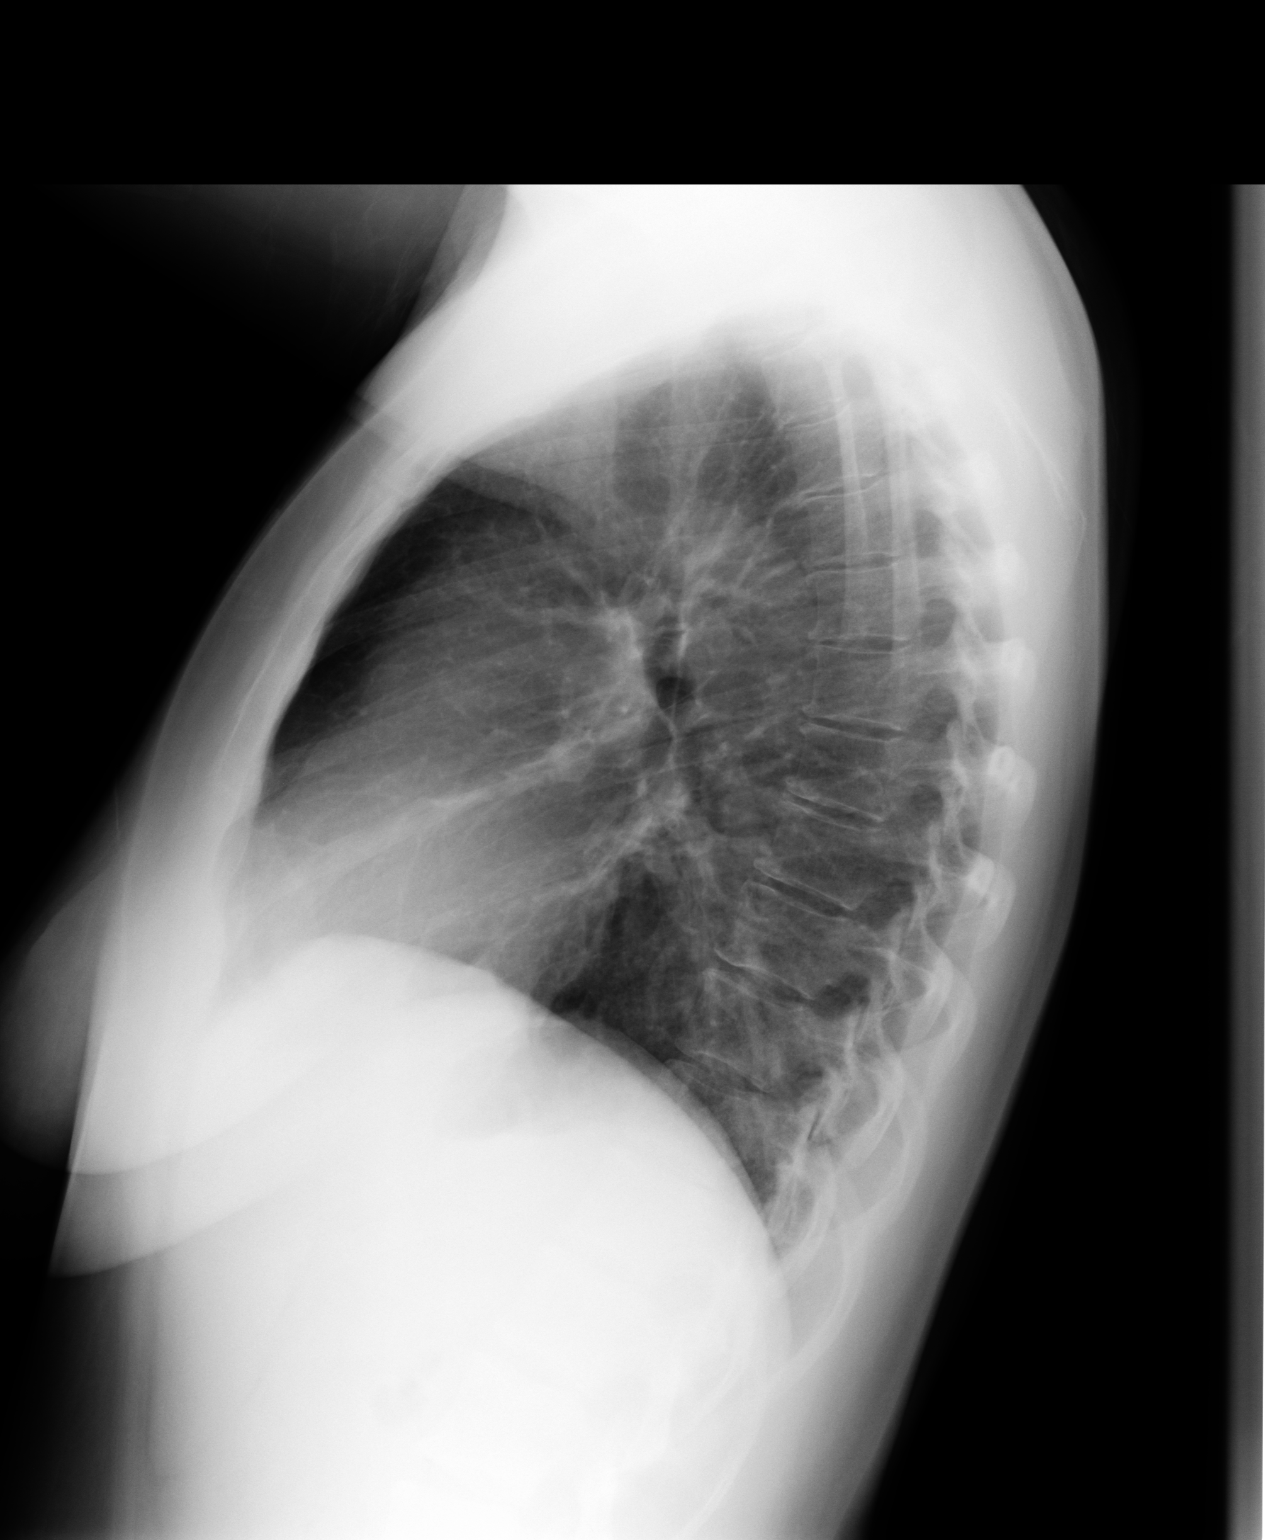

[2 of 2 positions shown; findings below may reference images not displayed]

IMPRESSION: 1.     No significant abnormalities are noted.

## 2009-07-09 ENCOUNTER — Inpatient Hospital Stay: Payer: Self-pay | Admitting: *Deleted

## 2009-07-09 IMAGING — CR DG CHEST 1V PORT
1 series · 1 of 1 positions shown · non-contrast
Comparison: none

REASON FOR EXAM: leukocytosis vomiting
COMMENTS:

[view not recorded]
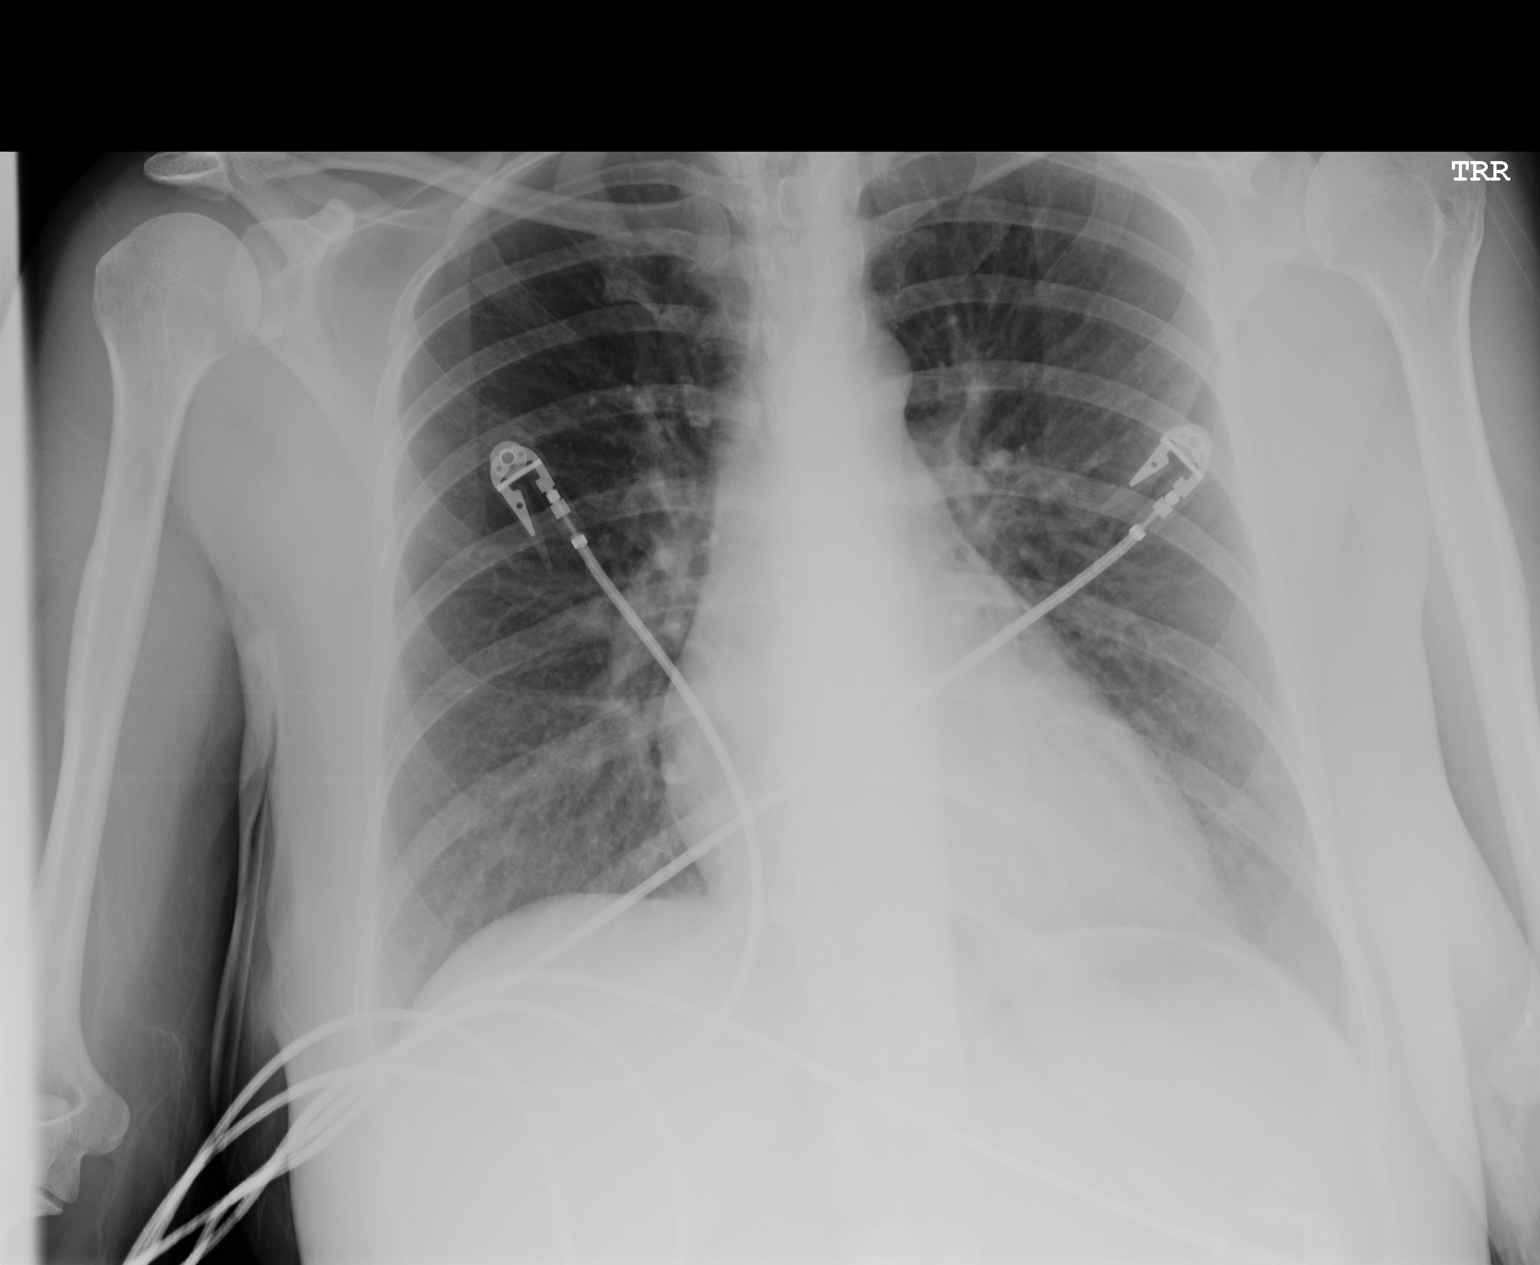

[1 of 1 positions shown; findings below may reference images not displayed]

PROCEDURE:     DXR - DXR PORTABLE CHEST SINGLE VIEW  - [DATE]  [DATE]

RESULT:     Comparison is made to images of [DATE].

Cardiac monitoring electrodes are present. The lungs are clear. The heart
and pulmonary vessels are normal. The bony and mediastinal structures are
unremarkable. There is no effusion. There is no pneumothorax or evidence of
congestive failure.
IMPRESSION: No acute cardiopulmonary disease.

## 2009-10-03 ENCOUNTER — Inpatient Hospital Stay: Payer: Self-pay | Admitting: Student

## 2009-10-03 IMAGING — CR DG CHEST 1V PORT
1 series · 1 of 1 positions shown · non-contrast
Comparison: none

REASON FOR EXAM: leukocytosis
COMMENTS:

PROCEDURE:     DXR - DXR PORTABLE CHEST SINGLE VIEW  - [DATE]  [DATE]
RESULT:     Comparison is made to the study [DATE].
The lungs are adequately inflated and clear. The heart is normal in size.
The pulmonary vascularity is not engorged. There is no pleural effusion.

[view not recorded]
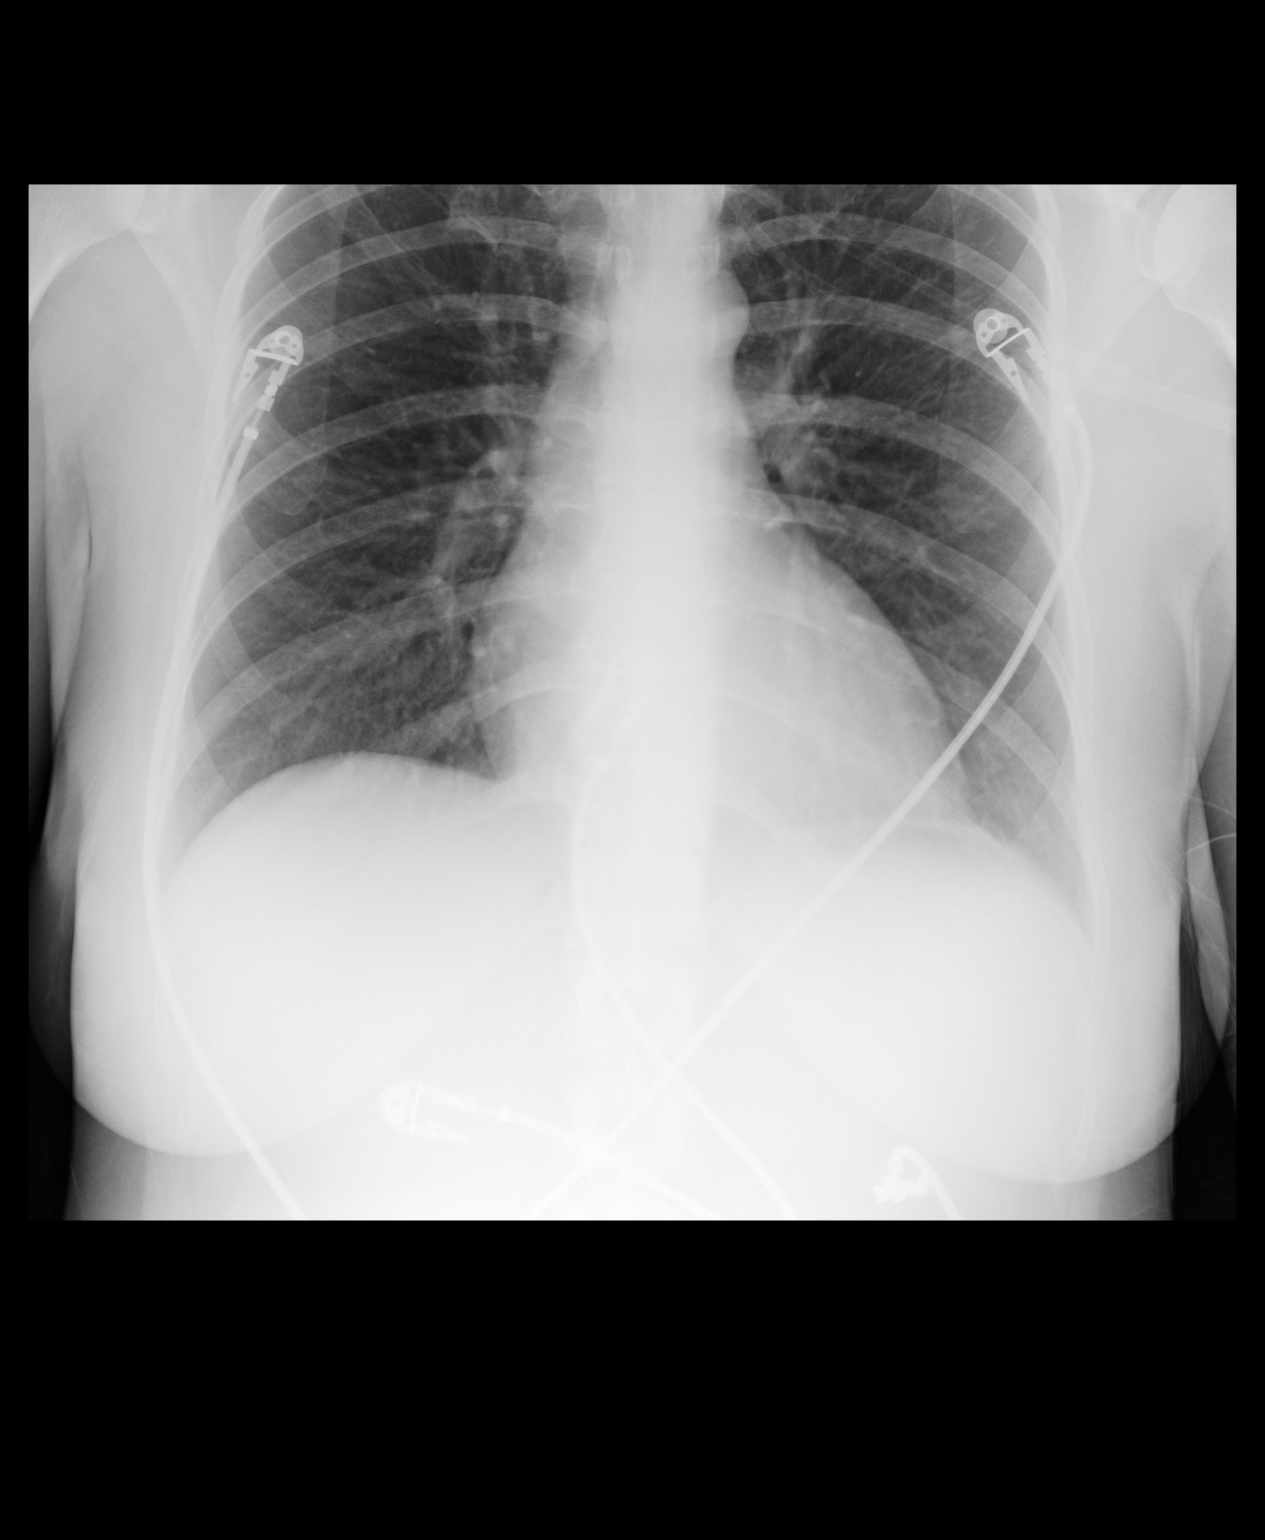

[1 of 1 positions shown; findings below may reference images not displayed]

IMPRESSION: I do not see evidence of acute cardiopulmonary abnormality.

## 2009-12-14 ENCOUNTER — Inpatient Hospital Stay: Payer: Self-pay | Admitting: Internal Medicine

## 2011-01-07 ENCOUNTER — Ambulatory Visit: Payer: Self-pay | Admitting: Adult Health

## 2011-01-28 ENCOUNTER — Observation Stay: Payer: Self-pay | Admitting: Internal Medicine

## 2011-01-28 IMAGING — US US PELV - US TRANSVAGINAL
1 series · 17 of 25 positions shown · non-contrast
Comparison: none

REASON FOR EXAM: anemia, heavy menstruation.
COMMENTS:

PROCEDURE:     US  - US PELVIS EXAM W/TRANSVAGINAL  - [DATE]  [DATE]
RESULT:     Comparison: None
INDICATION: Anemia. Heavy menstrual period.
TECHNIQUE: Multiple transabdominal gray-scale images and endovaginal
gray-scale images with doppler images of the pelvis performed.

[Series 1: us pelv - us transvaginal · 17 of 63 slices shown]
[im 1/63]
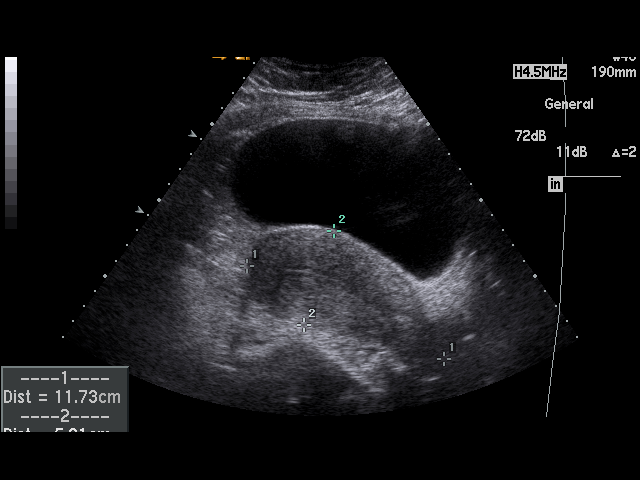
[im 6/63]
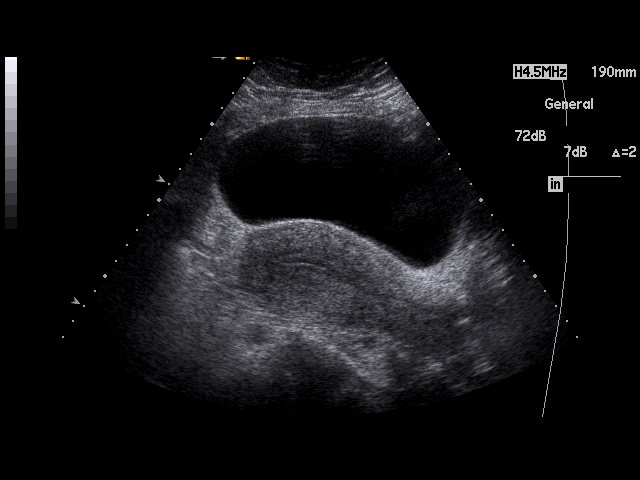
[im 8/63]
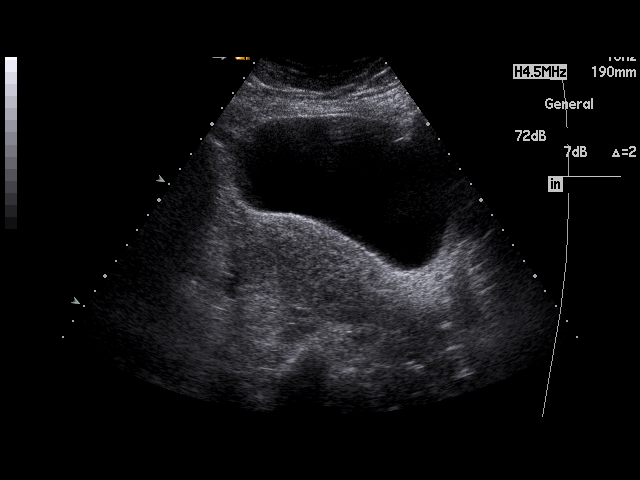
[im 13/63]
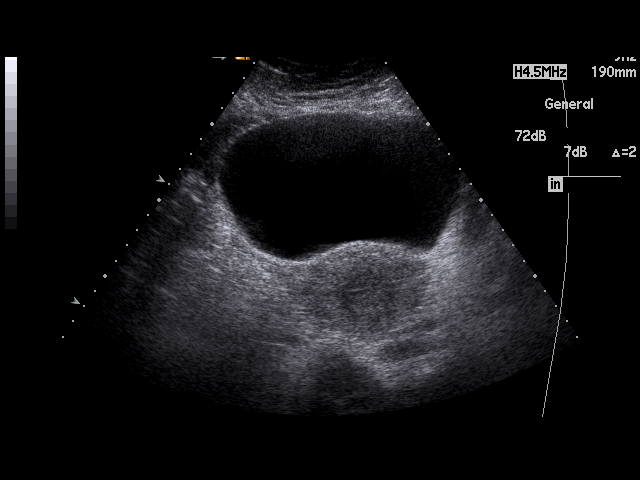
[im 16/63]
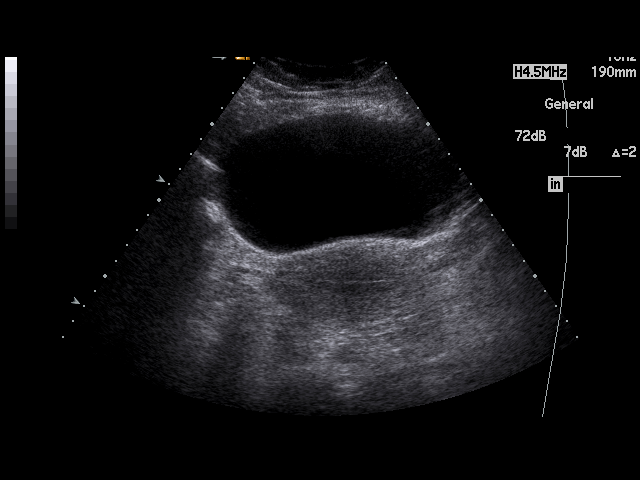
[im 21/63]
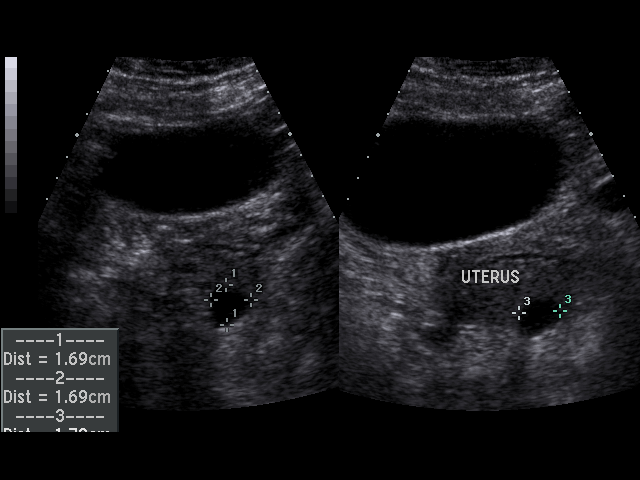
[im 24/63]
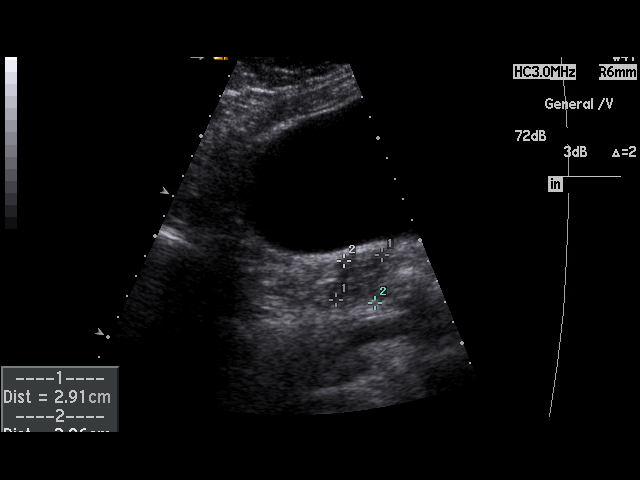
[im 29/63]
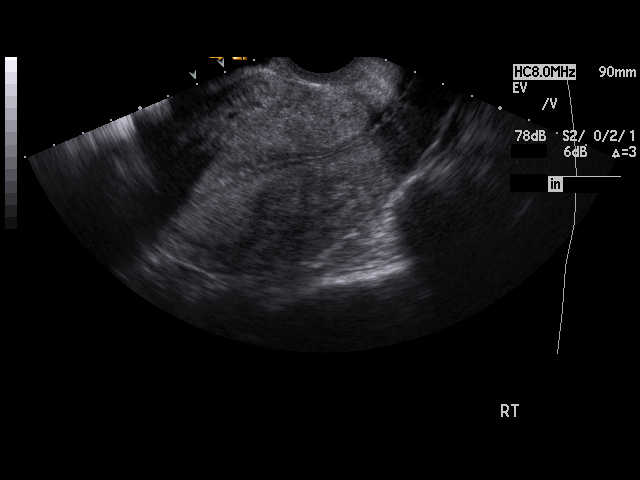
[im 32/63]
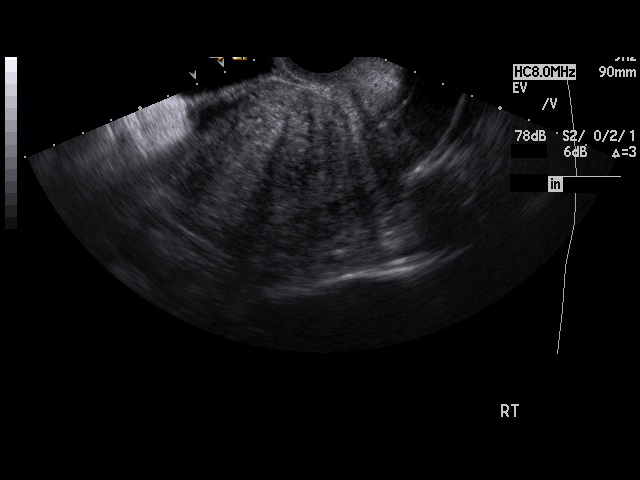
[im 34/63]
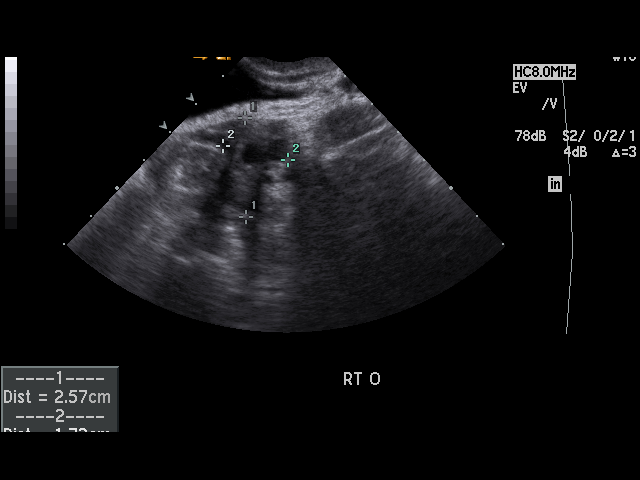
[im 39/63]
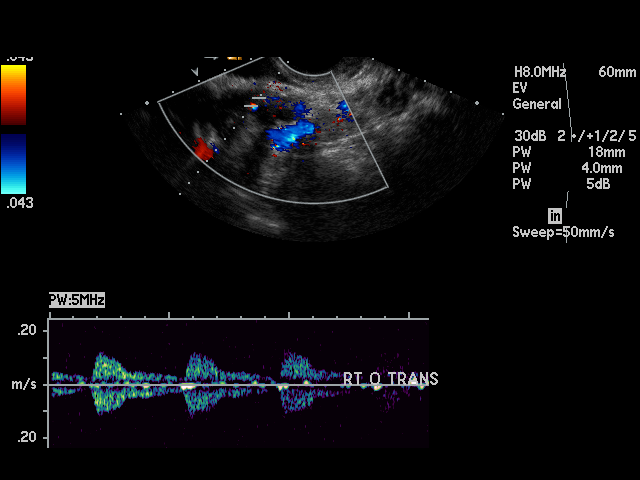
[im 42/63]
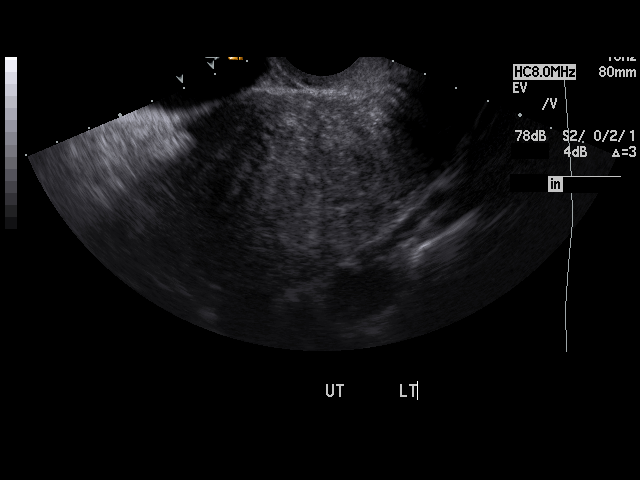
[im 47/63]
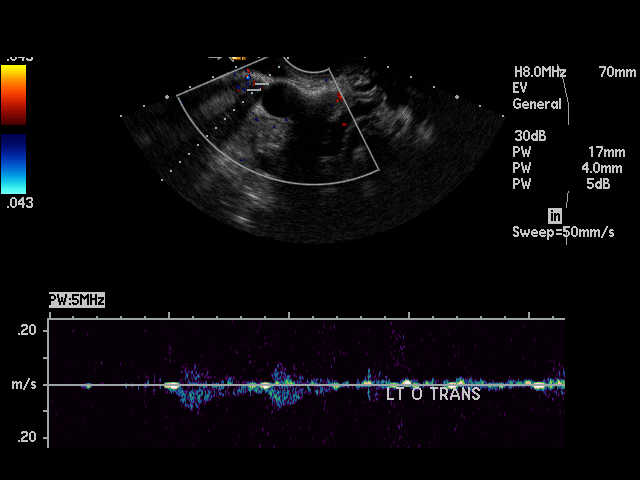
[im 50/63]
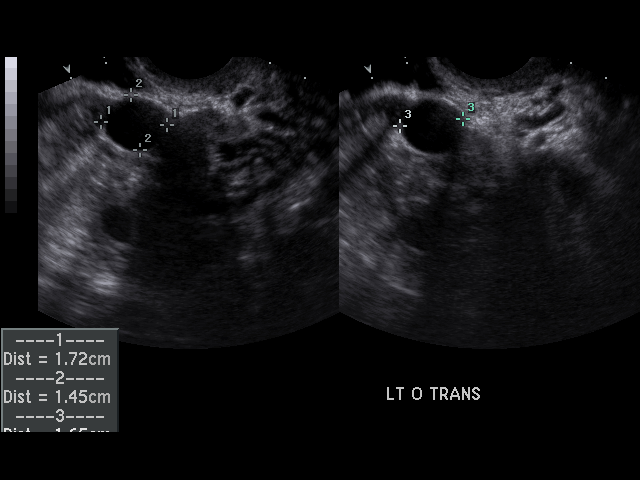
[im 55/63]
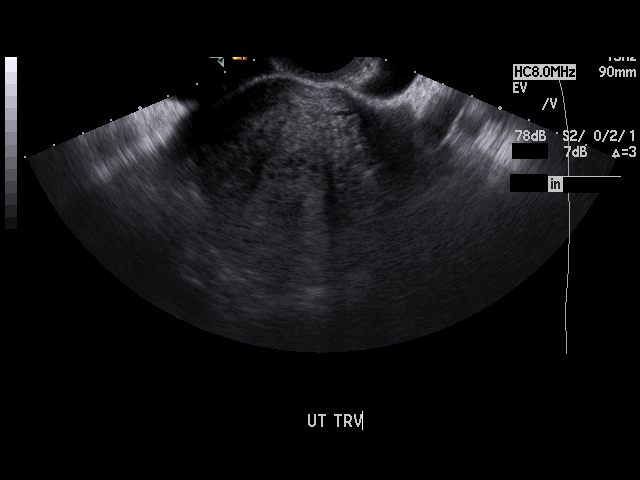
[im 57/63]
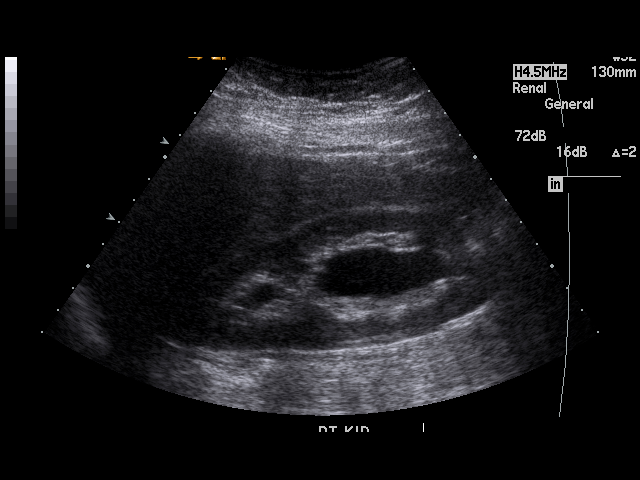
[im 63/63]
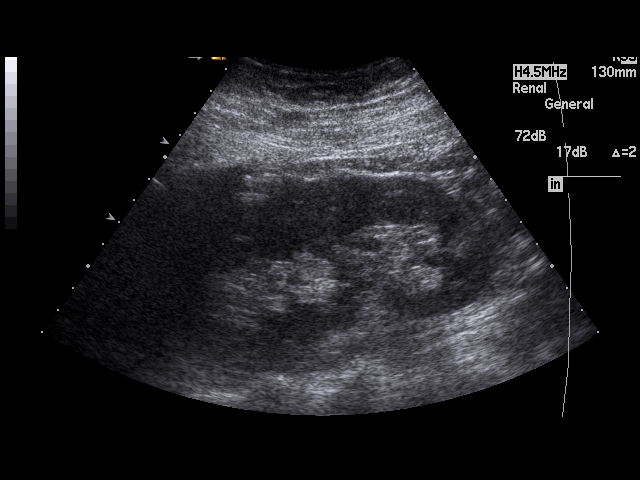

[17 of 25 positions shown; findings below may reference images not displayed]

FINDINGS: The uterus is normal in echotexture measuring 11.7 x 6.5 x 5.3 cm , with
transabdominal ultrasound. The endometrial stripe measures 8.8 mm.  There
are no abnormal solid or cystic myometrial mass lesions noted.

The right ovary measures 2.3 x 2.2 x 1.7 cm.  The left ovary measures 3.6 x
3.4 x 2 cm. Normal arterial and venous Doppler waveforms are demonstrated
bilaterally. There is no adnexal mass.

There is moderate right hydronephrosis.

There is no pelvic free fluid.
IMPRESSION: Normal pelvic ultrasound for patient's age and menstrual history.

## 2011-08-17 ENCOUNTER — Ambulatory Visit: Payer: Self-pay

## 2011-08-17 IMAGING — CR DG HAND COMPLETE 3+V*L*
1 series · 3 of 3 positions shown · non-contrast
Comparison: none

REASON FOR EXAM: [DATE] pain in joint involving hand , left thumb
COMMENTS:

[Series 1: view not recorded · 0.17mm/px · 3 of 3 slices shown]
[im 1/3]
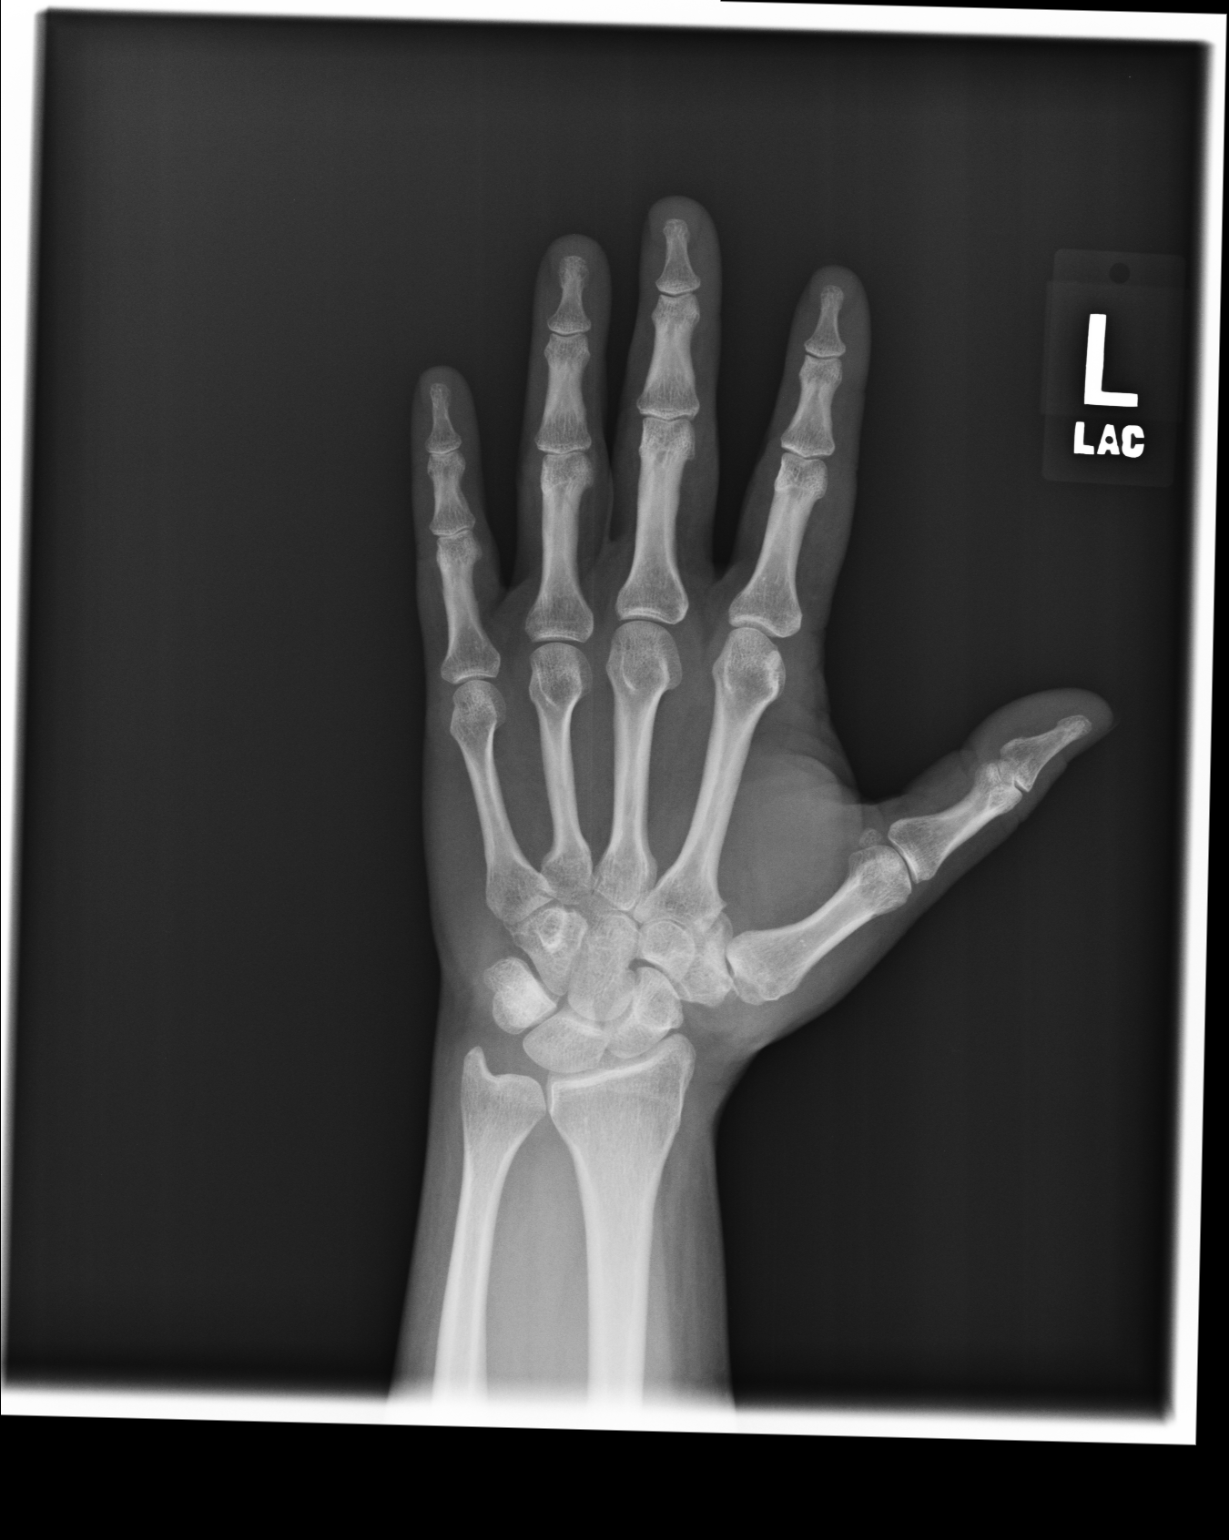
[im 2/3]
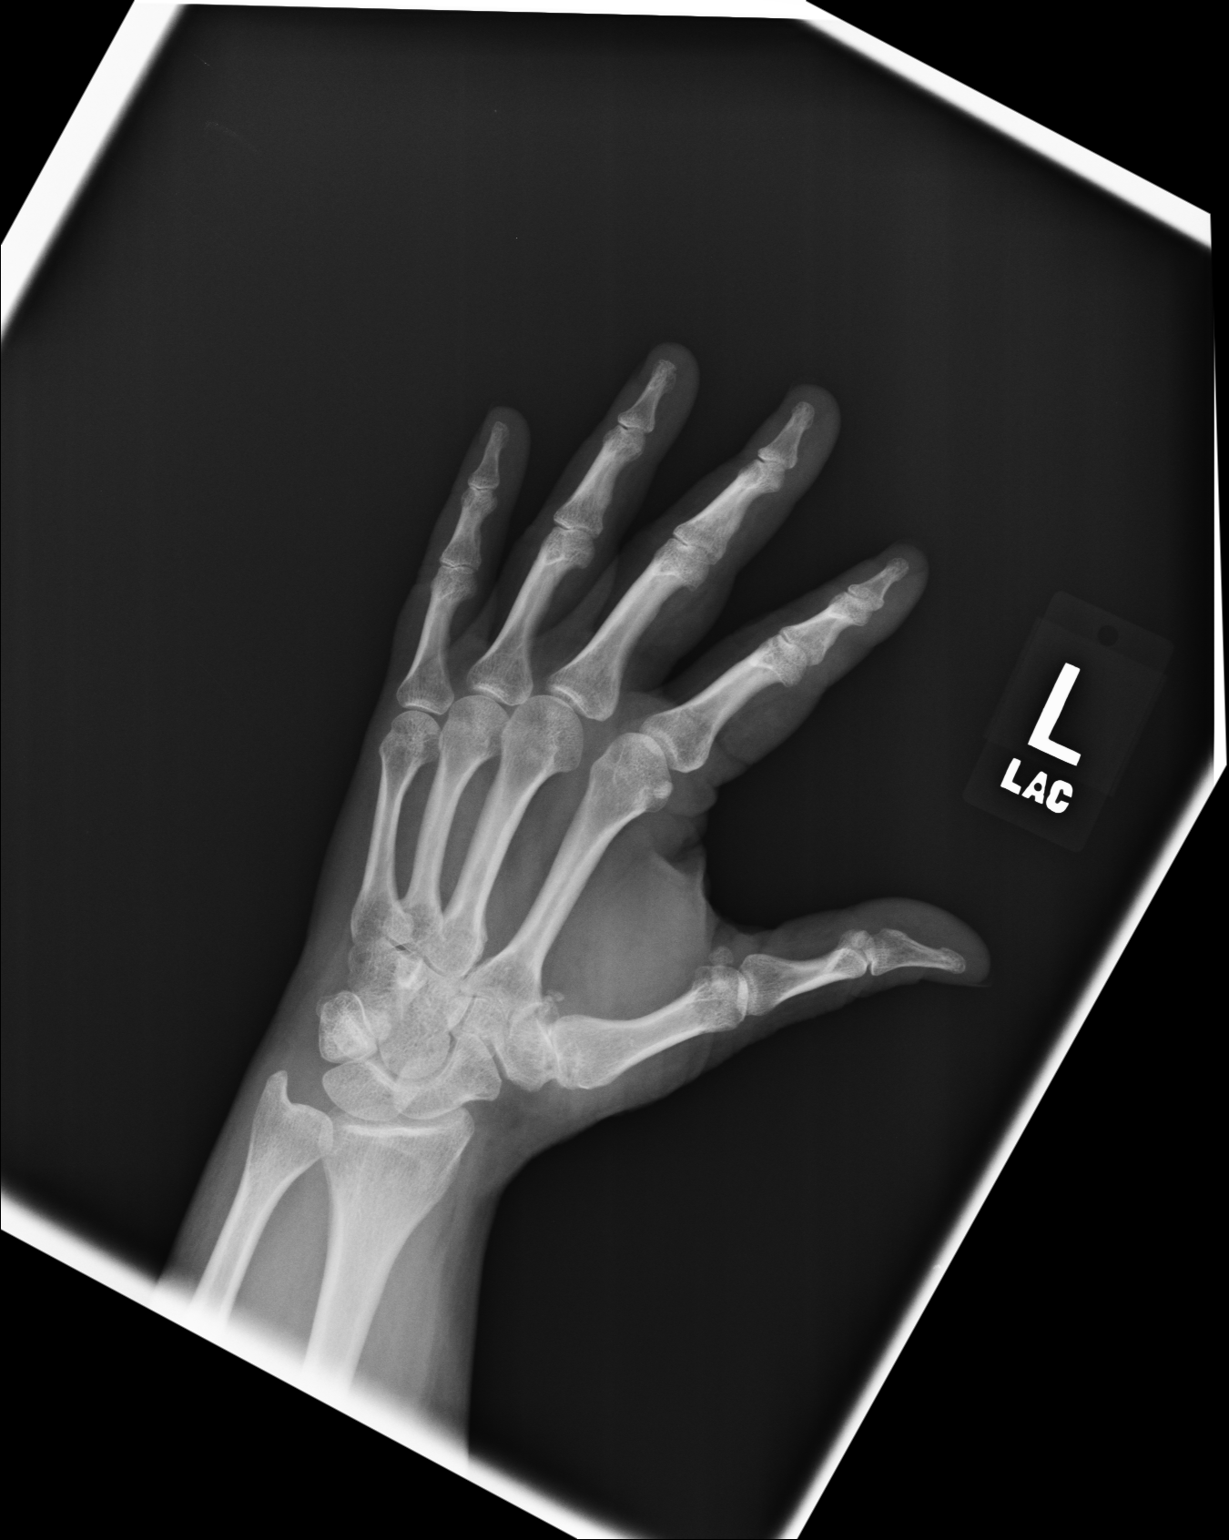
[im 3/3]
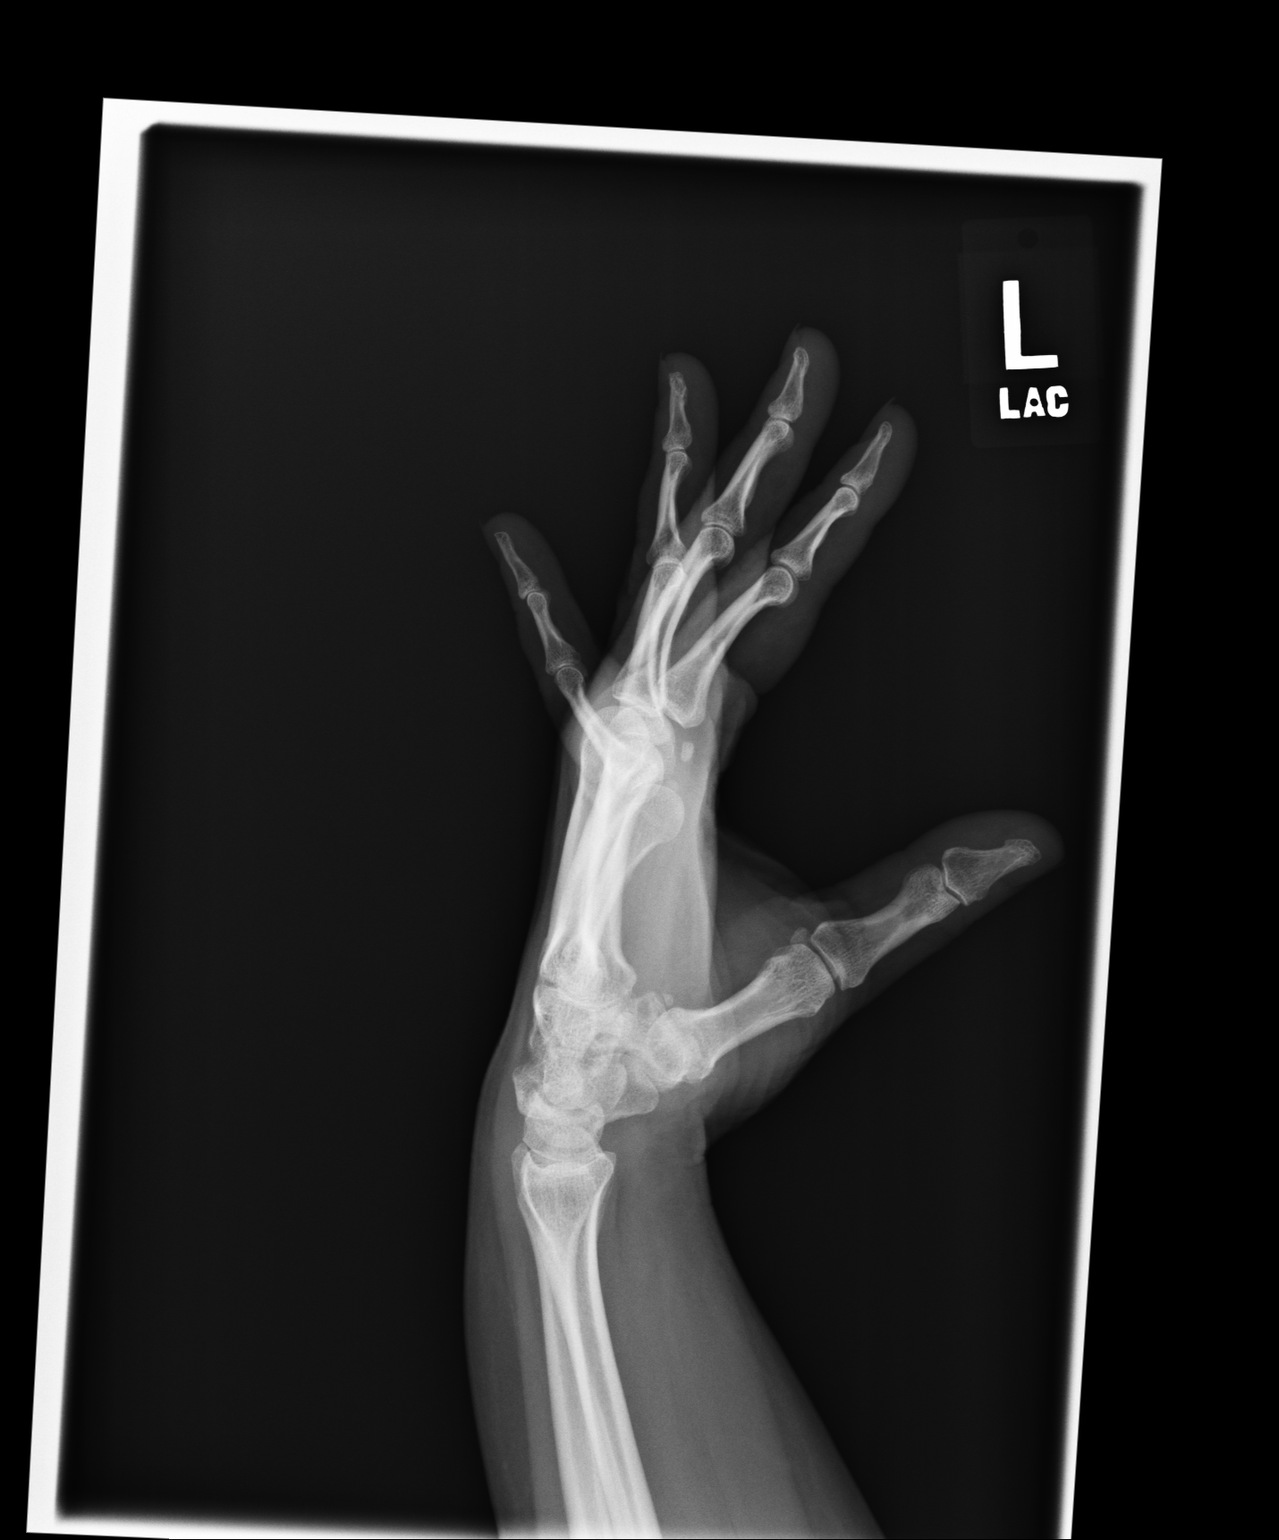

[3 of 3 positions shown; findings below may reference images not displayed]

PROCEDURE:     DXR - DXR HAND LT COMPLETE  W/OBLIQUES  - [DATE] [DATE]

RESULT:     No fracture, dislocation or other acute bony abnormality is
identified. There is noted minimal cystic change in the proximal portion of
the first metacarpal. Early arthritic change at the first carpometacarpal
articulation cannot be excluded. The navicular bone is normal in appearance.
IMPRESSION: 1. No acute bony abnormalities are identified.
2. Possible early arthritic change at the first carpometacarpal articulation.

## 2011-08-17 IMAGING — MG MAM BCCCP DIG SCREEN MAM W/CAD
1 series · 4 of 4 positions shown · non-contrast
Comparison: none

REASON FOR EXAM: baseline
COMMENTS:

[Series 6896: R CC · right · 4 of 4 slices shown]
[im 1/4]
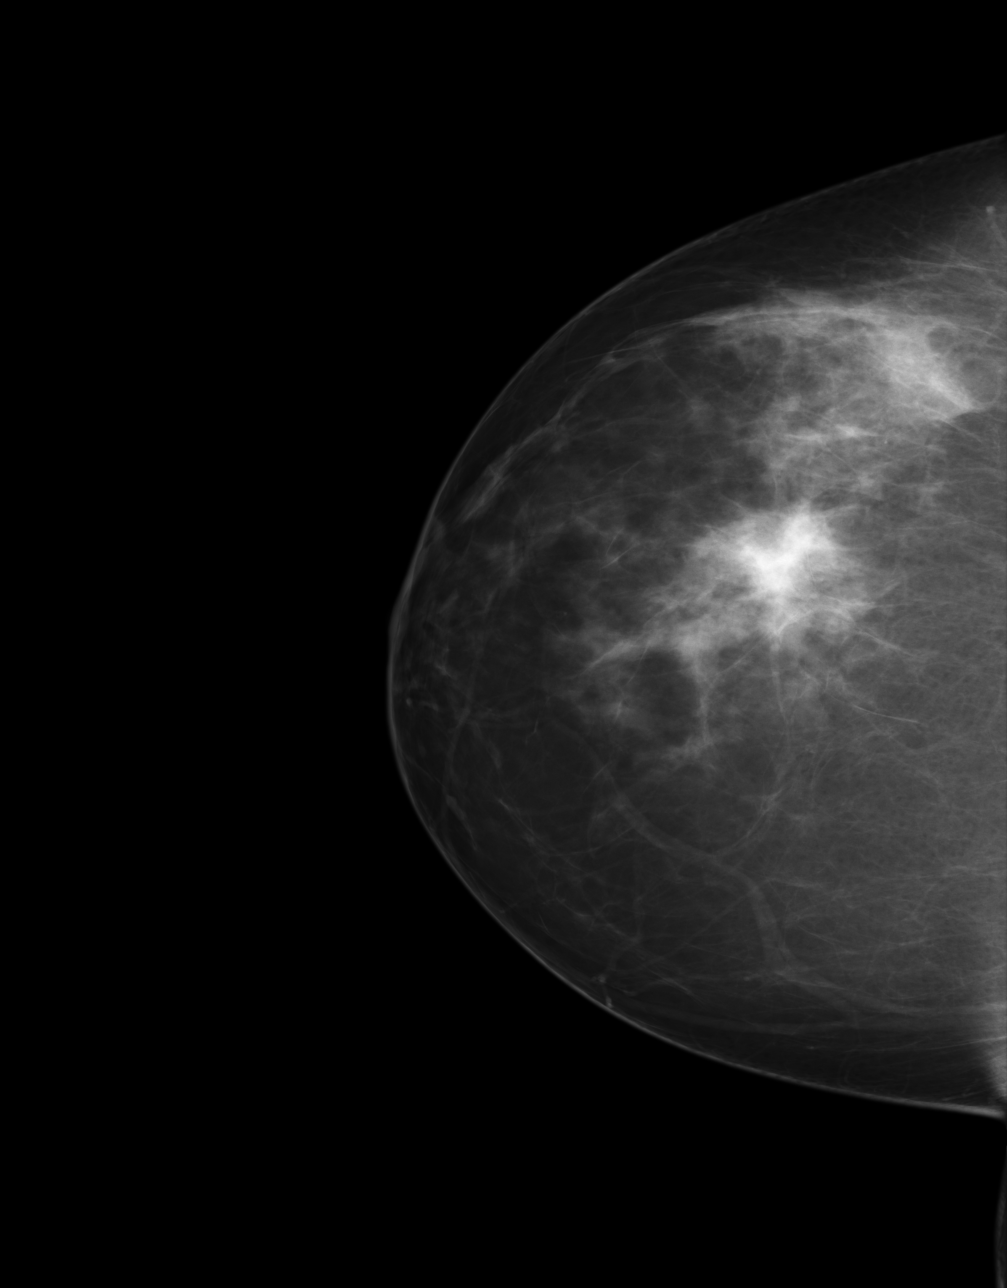
[im 2/4]
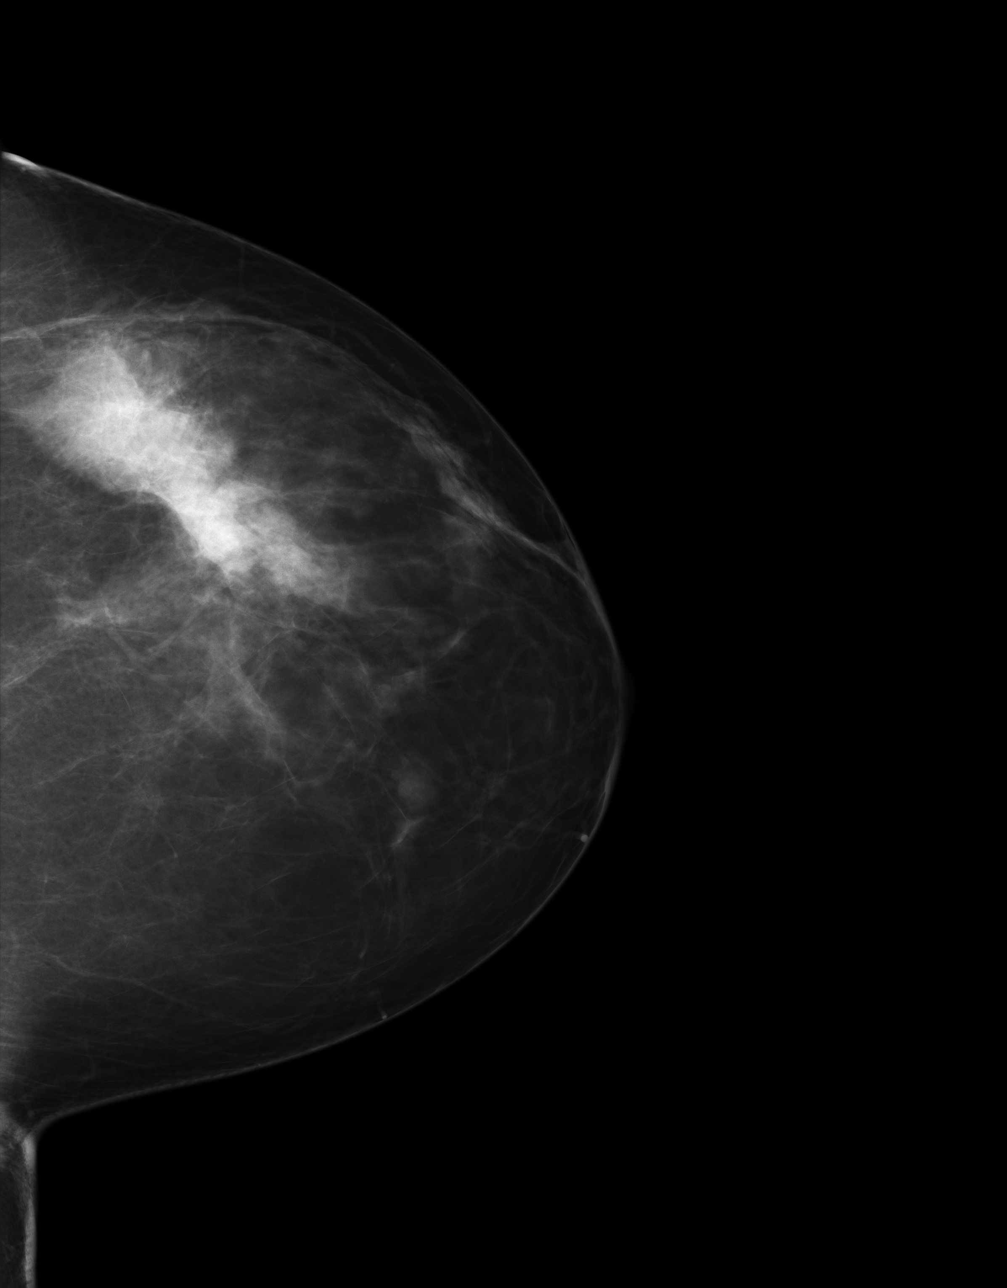
[im 3/4]
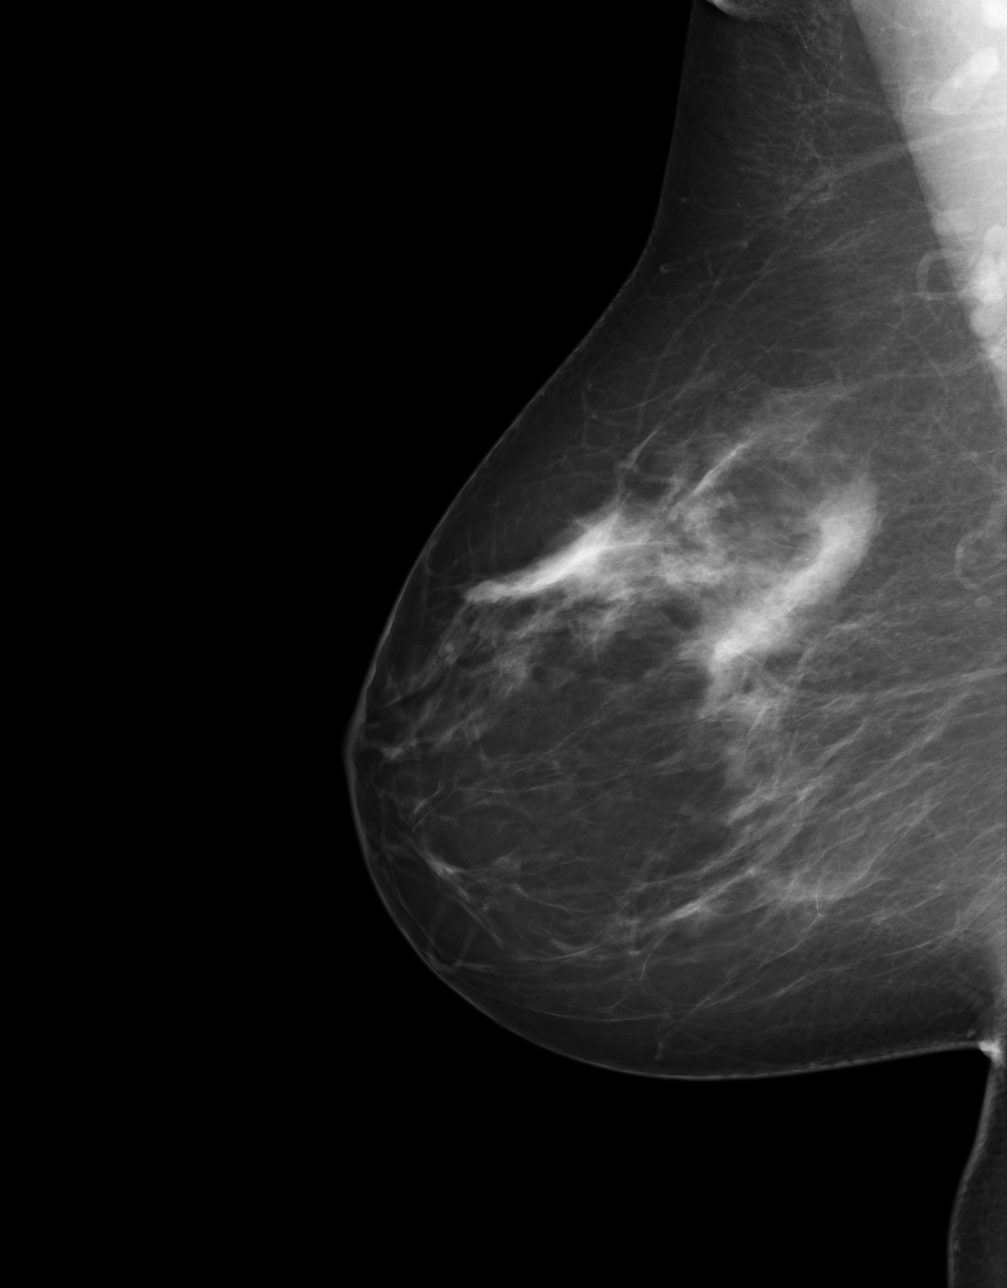
[im 4/4]
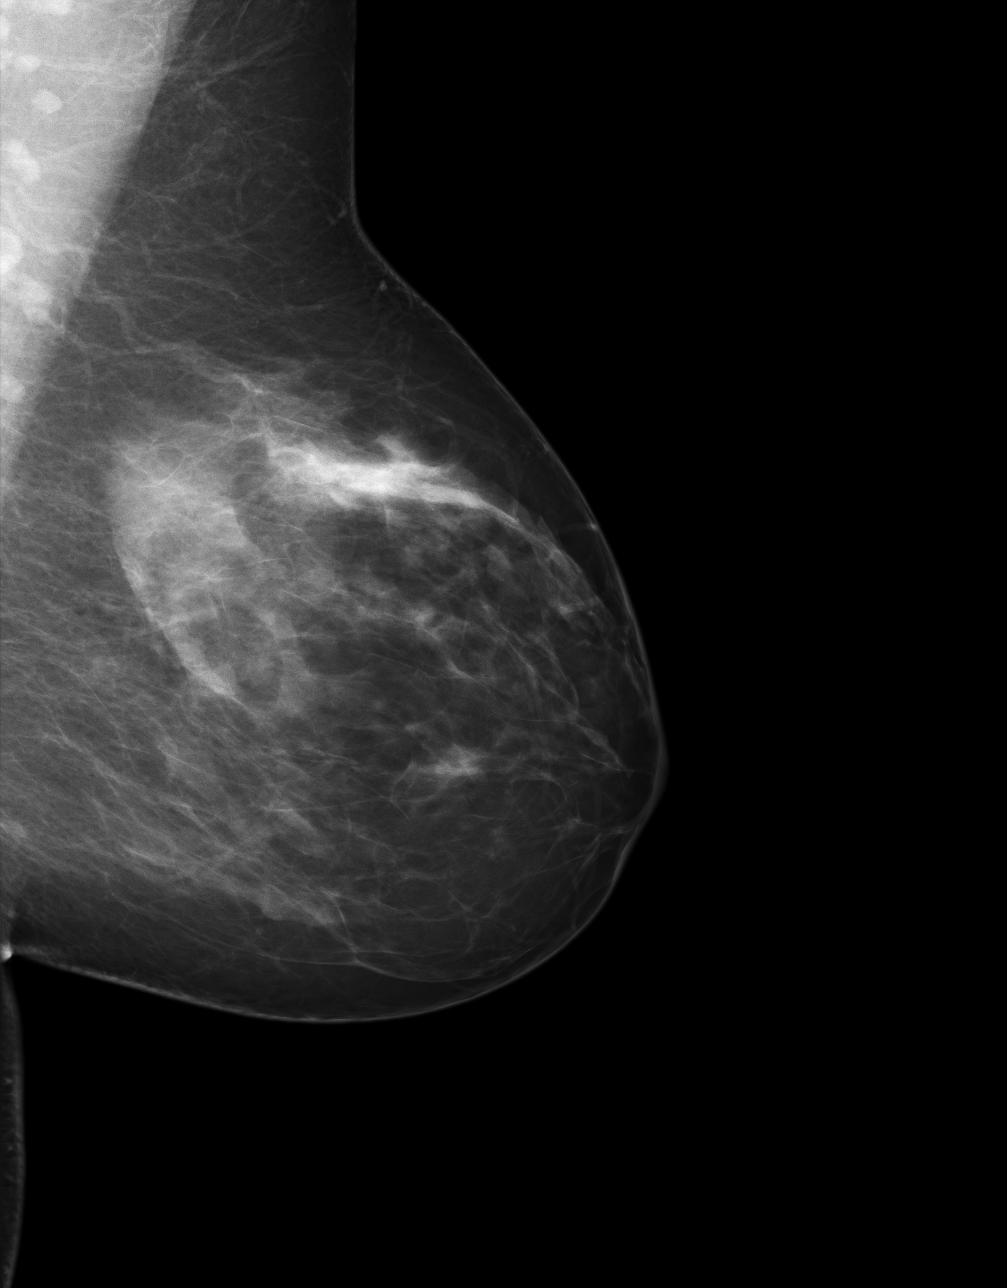

[4 of 4 positions shown; findings below may reference images not displayed]

PROCEDURE:     MAM - MAM [REDACTED] DIG SCREEN MAM W/CAD  - [DATE] [DATE]

RESULT:     There are no prior mammograms for comparison. There are
scattered fibroglandular densities bilaterally. No dominant mass or
malignant appearing microcalcifications are seen. Multiple small lymph nodes
are noted in the axillary regions bilaterally.
IMPRESSION: 1. Bilaterally benign-appearing screening mammography.
2. Annual screening mammography is recommended.

BI-RADS: Category 1 - Negative

A NEGATIVE MAMMOGRAM REPORT DOES NOT PRECLUDE BIOPSY OR OTHER EVALUATION OF
A CLINICALLY PALPABLE OR OTHERWISE SUSPICIOUS MASS OR LESION. BREAST CANCER
MAY NOT BE DETECTED BY MAMMOGRAPHY IN UP TO 10% OF CASES.

## 2011-10-25 ENCOUNTER — Ambulatory Visit: Payer: Self-pay | Admitting: Family Medicine

## 2011-11-16 ENCOUNTER — Ambulatory Visit: Payer: Self-pay | Admitting: Family Medicine

## 2012-11-12 LAB — COMPREHENSIVE METABOLIC PANEL
Albumin: 3.9 g/dL (ref 3.4–5.0)
Alkaline Phosphatase: 142 U/L — ABNORMAL HIGH (ref 50–136)
BUN: 15 mg/dL (ref 7–18)
Calcium, Total: 8.9 mg/dL (ref 8.5–10.1)
Chloride: 103 mmol/L (ref 98–107)
Creatinine: 0.83 mg/dL (ref 0.60–1.30)
Glucose: 398 mg/dL — ABNORMAL HIGH (ref 65–99)
Potassium: 5.1 mmol/L (ref 3.5–5.1)
SGOT(AST): 15 U/L (ref 15–37)
Total Protein: 8.3 g/dL — ABNORMAL HIGH (ref 6.4–8.2)

## 2012-11-12 LAB — HEMOGLOBIN A1C: Hemoglobin A1C: 12.1 % — ABNORMAL HIGH (ref 4.2–6.3)

## 2012-11-12 LAB — CBC
HCT: 47.9 % — ABNORMAL HIGH (ref 35.0–47.0)
HGB: 15.8 g/dL (ref 12.0–16.0)
MCH: 27.9 pg (ref 26.0–34.0)
Platelet: 349 10*3/uL (ref 150–440)
RBC: 5.67 10*6/uL — ABNORMAL HIGH (ref 3.80–5.20)
WBC: 12.4 10*3/uL — ABNORMAL HIGH (ref 3.6–11.0)

## 2012-11-12 LAB — TROPONIN I: Troponin-I: <0.02 ng/mL

## 2012-11-12 LAB — MAGNESIUM: Magnesium: 2.2 mg/dL

## 2012-11-12 LAB — CK TOTAL AND CKMB (NOT AT ARMC): CK, Total: 38 U/L (ref 21–215)

## 2012-11-12 IMAGING — CR DG CHEST 1V PORT
1 series · 1 of 1 positions shown · non-contrast
Comparison: none

REASON FOR EXAM: dyspnea, DKA
COMMENTS:

PROCEDURE:     DXR - DXR PORTABLE CHEST SINGLE VIEW  - [DATE] [DATE]
RESULT:     Comparison: [DATE]

[ap]
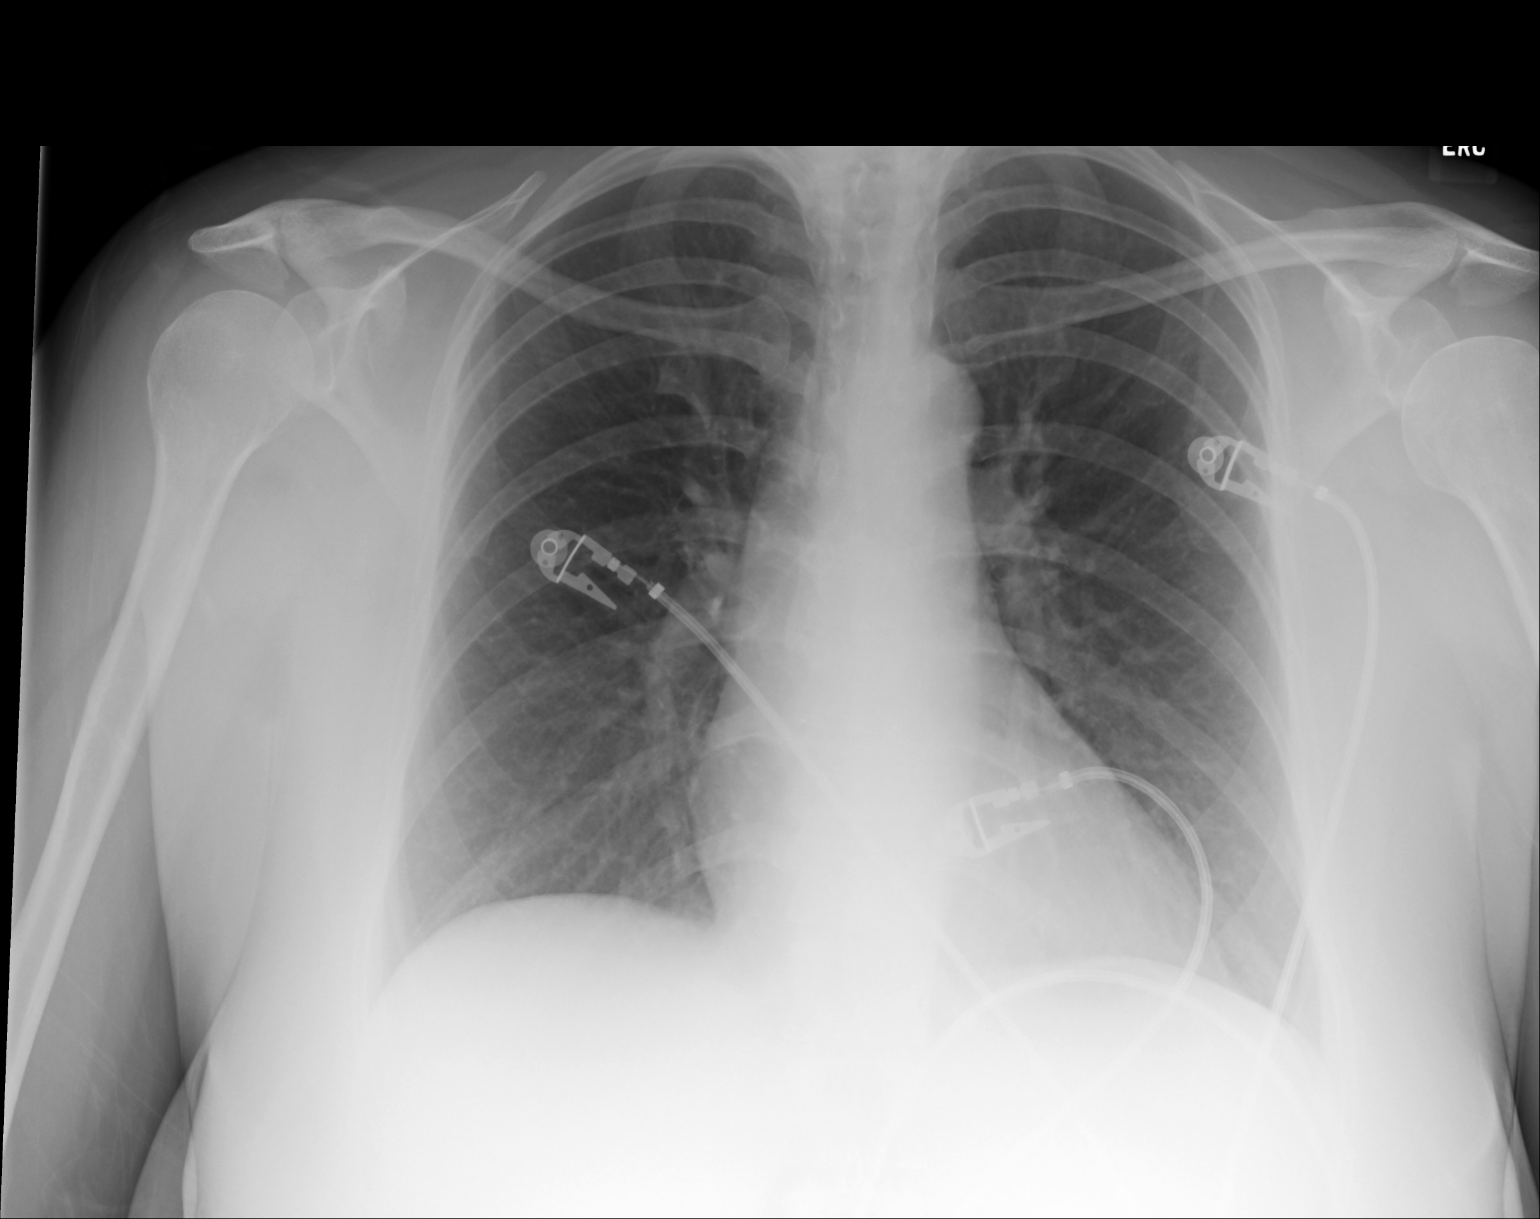

[1 of 1 positions shown; findings below may reference images not displayed]

FINDINGS: The heart and mediastinum are stable. No focal pulmonary opacities.
IMPRESSION: No acute cardiopulmonary disease.

[REDACTED]

## 2012-11-13 ENCOUNTER — Inpatient Hospital Stay: Payer: Self-pay | Admitting: Internal Medicine

## 2012-11-13 LAB — BASIC METABOLIC PANEL
Anion Gap: 14 (ref 7–16)
Anion Gap: 6 — ABNORMAL LOW (ref 7–16)
BUN: 11 mg/dL (ref 7–18)
BUN: 10 mg/dL (ref 7–18)
Calcium, Total: 8.2 mg/dL — ABNORMAL LOW (ref 8.5–10.1)
Co2: 12 mmol/L — ABNORMAL LOW (ref 21–32)
Co2: 20 mmol/L — ABNORMAL LOW (ref 21–32)
Creatinine: 0.72 mg/dL (ref 0.60–1.30)
Creatinine: 0.65 mg/dL (ref 0.60–1.30)
EGFR (African American): >60
EGFR (Non-African Amer.): >60
EGFR (Non-African Amer.): >60
Glucose: 106 mg/dL — ABNORMAL HIGH (ref 65–99)
Potassium: 3.9 mmol/L (ref 3.5–5.1)
Potassium: 3.5 mmol/L (ref 3.5–5.1)
Sodium: 140 mmol/L (ref 136–145)
Sodium: 138 mmol/L (ref 136–145)

## 2012-11-13 LAB — URINALYSIS, COMPLETE
Blood: NEGATIVE
Glucose,UR: >=500 mg/dL (ref 0–75)
Nitrite: NEGATIVE
Specific Gravity: 1.027 (ref 1.003–1.030)
WBC UR: 4 /HPF (ref 0–5)

## 2012-11-13 LAB — DRUG SCREEN, URINE
Amphetamines, Ur Screen: NEGATIVE (ref ?–1000)
Barbiturates, Ur Screen: NEGATIVE (ref ?–200)
Cannabinoid 50 Ng, Ur ~~LOC~~: NEGATIVE (ref ?–50)
Cocaine Metabolite,Ur ~~LOC~~: POSITIVE (ref ?–300)
MDMA (Ecstasy)Ur Screen: NEGATIVE (ref ?–500)
Opiate, Ur Screen: NEGATIVE (ref ?–300)
Tricyclic, Ur Screen: NEGATIVE (ref ?–1000)

## 2012-11-13 IMAGING — CR DG CHEST 1V PORT
1 series · 1 of 1 positions shown · non-contrast
Comparison: none

REASON FOR EXAM: verify central line placement
COMMENTS:

PROCEDURE:     DXR - DXR PORTABLE CHEST SINGLE VIEW  - [DATE] [DATE]
RESULT:     Comparison: [DATE]

[ap]
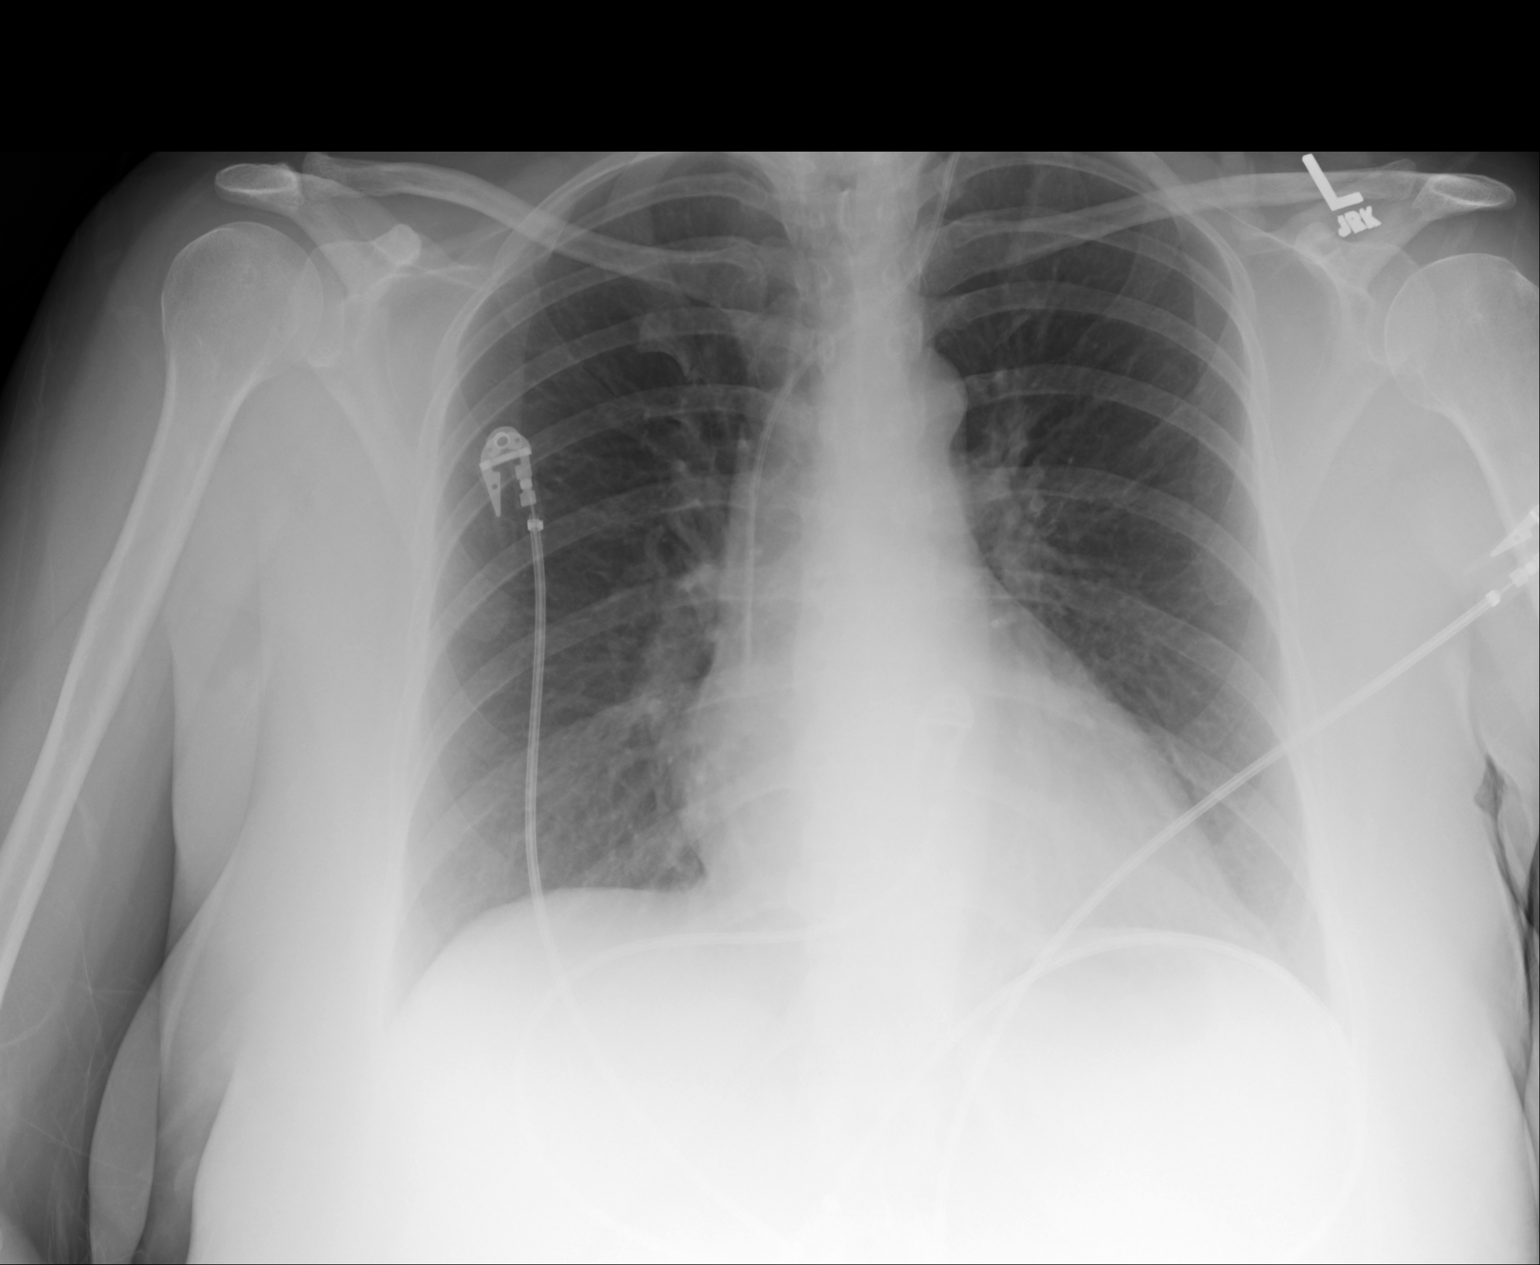

[1 of 1 positions shown; findings below may reference images not displayed]

FINDINGS: A left IJ central venous catheter tip terminates over the cavoatrial
junction. No pneumothorax. Heart and mediastinum are stable. No focal
pulmonary opacities.
IMPRESSION: Left IJ central venous catheter tip terminates over the cavoatrial junction.

[REDACTED]

## 2012-11-14 LAB — BASIC METABOLIC PANEL
Anion Gap: 8 (ref 7–16)
BUN: 6 mg/dL — ABNORMAL LOW (ref 7–18)
Calcium, Total: 7.7 mg/dL — ABNORMAL LOW (ref 8.5–10.1)
Chloride: 114 mmol/L — ABNORMAL HIGH (ref 98–107)
Co2: 21 mmol/L (ref 21–32)
Creatinine: 0.56 mg/dL — ABNORMAL LOW (ref 0.60–1.30)
EGFR (Non-African Amer.): >60
Osmolality: 279 (ref 275–301)

## 2012-11-14 LAB — MAGNESIUM: Magnesium: 1.9 mg/dL

## 2013-05-08 ENCOUNTER — Emergency Department: Payer: Self-pay | Admitting: Emergency Medicine

## 2013-06-29 ENCOUNTER — Ambulatory Visit: Payer: Self-pay | Admitting: Internal Medicine

## 2013-06-29 IMAGING — CR DG KNEE COMPLETE 4+V*R*
1 series · 5 of 5 positions shown · non-contrast
Comparison: none

REASON FOR EXAM: knee pain
COMMENTS:

PROCEDURE:     DXR - DXR KNEE RT COMP WITH OBLIQUES  - [DATE] [DATE]
RESULT:     Comparison:  None

[Series 1: t knee ap right · 0.14mm/px · 5 of 5 slices shown]
[im 1/5]
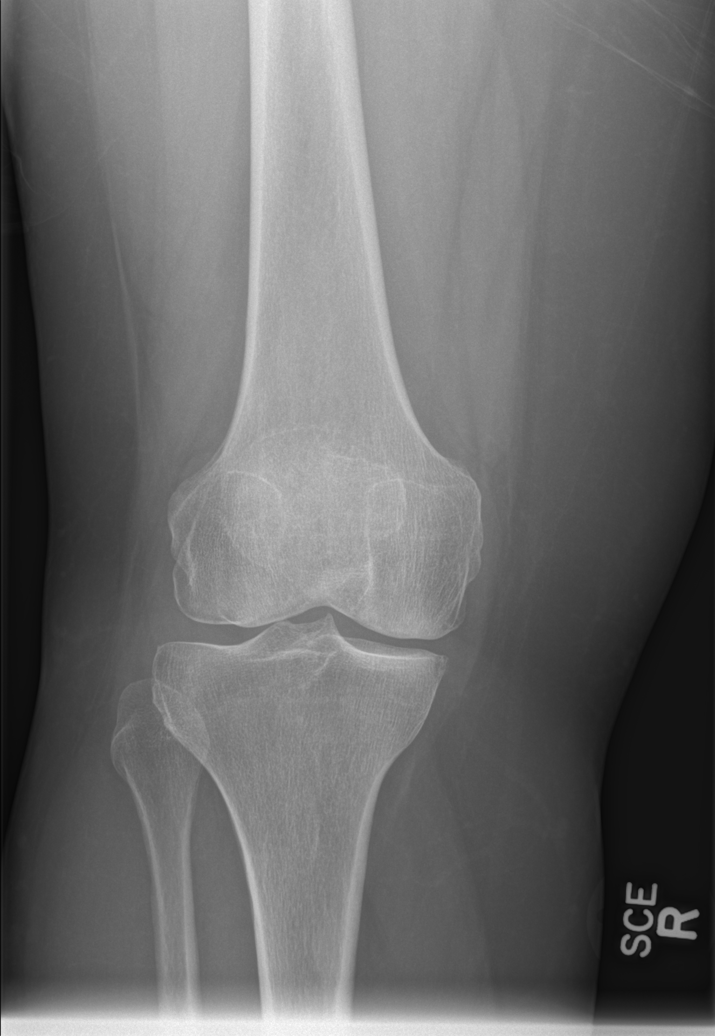
[im 2/5]
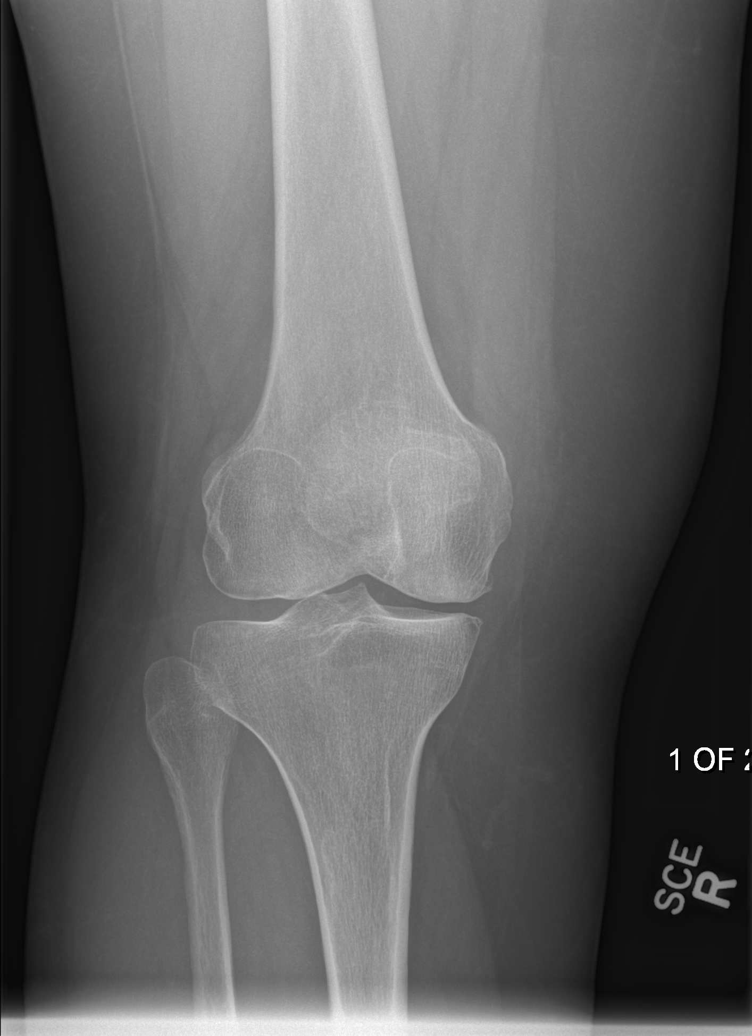
[im 3/5]
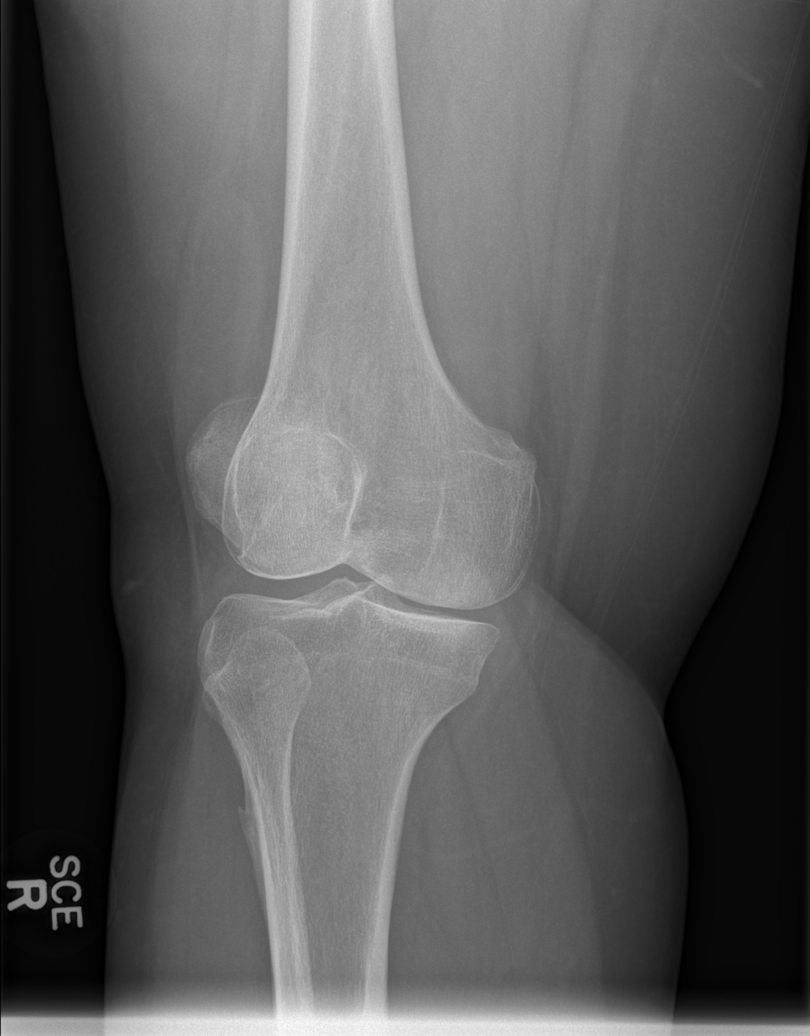
[im 4/5]
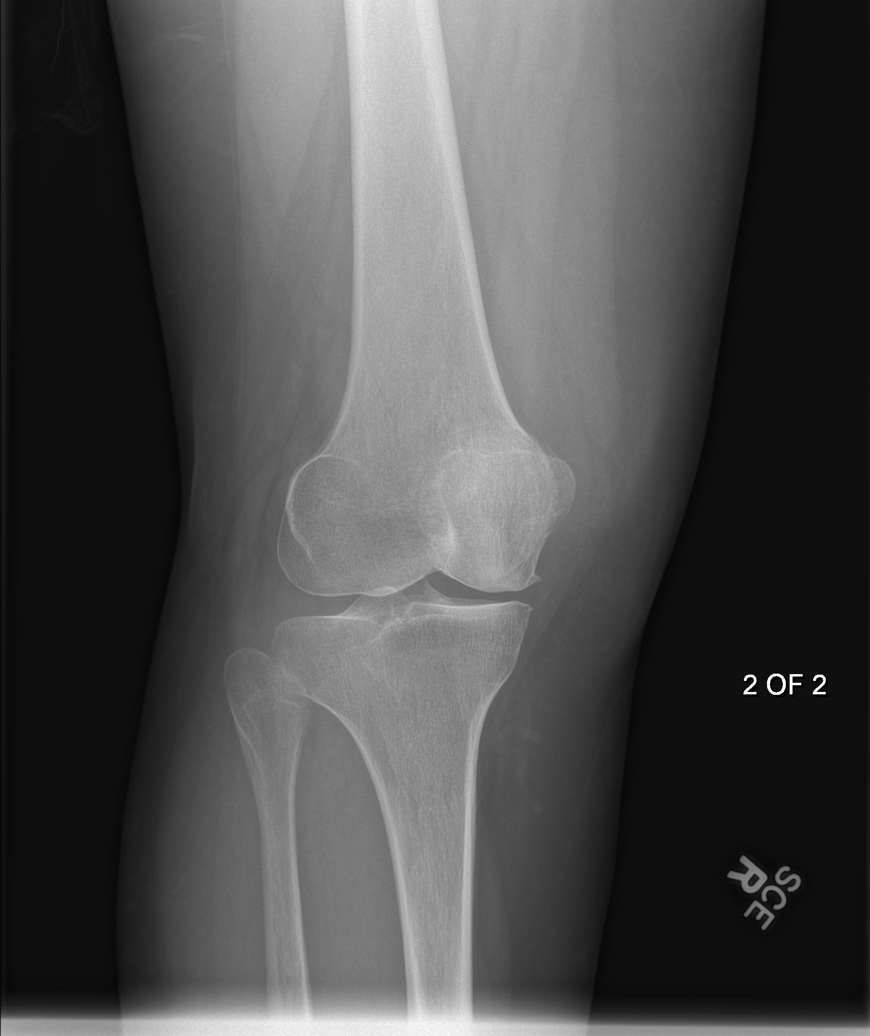
[im 5/5]
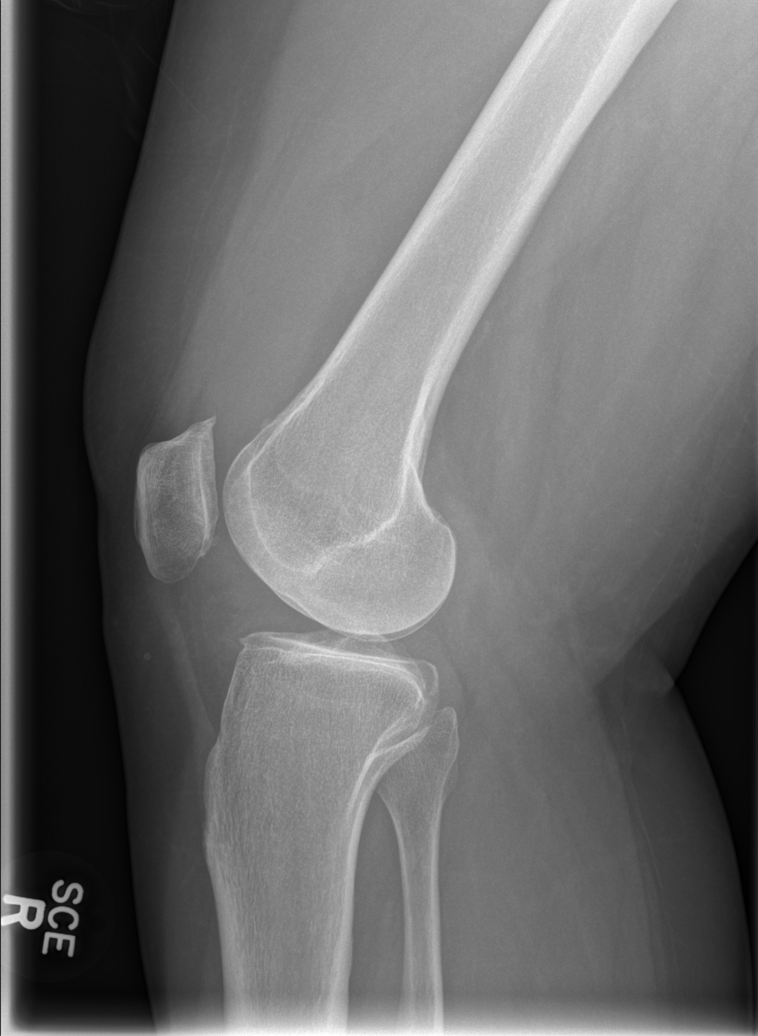

[5 of 5 positions shown; findings below may reference images not displayed]

FINDINGS: 5 views of the right knee demonstrates no acute fracture or dislocation.
There are degenerative changes of the patellofemoral compartment and medial
tibiofemoral compartment. There is a small joint effusion.
IMPRESSION: No acute osseous injury of the right knee.

[REDACTED]

## 2014-02-12 ENCOUNTER — Emergency Department: Payer: Self-pay | Admitting: Emergency Medicine

## 2014-02-12 LAB — COMPREHENSIVE METABOLIC PANEL
ALT: 16 U/L (ref 12–78)
AST: 18 U/L (ref 15–37)
Albumin: 3.9 g/dL (ref 3.4–5.0)
Alkaline Phosphatase: 106 U/L
Anion Gap: 9 (ref 7–16)
BUN: 17 mg/dL (ref 7–18)
Bilirubin,Total: 0.5 mg/dL (ref 0.2–1.0)
Calcium, Total: 8.9 mg/dL (ref 8.5–10.1)
Chloride: 92 mmol/L — ABNORMAL LOW (ref 98–107)
Co2: 25 mmol/L (ref 21–32)
Creatinine: 1.3 mg/dL (ref 0.60–1.30)
EGFR (African American): 58 — ABNORMAL LOW
GFR CALC NON AF AMER: 50 — AB
Glucose: 776 mg/dL (ref 65–99)
OSMOLALITY: 293 (ref 275–301)
POTASSIUM: 4.3 mmol/L (ref 3.5–5.1)
Sodium: 126 mmol/L — ABNORMAL LOW (ref 136–145)
Total Protein: 8.1 g/dL (ref 6.4–8.2)

## 2014-02-12 LAB — URINALYSIS, COMPLETE
Bilirubin,UR: NEGATIVE
Glucose,UR: >=500 mg/dL (ref 0–75)
Leukocyte Esterase: NEGATIVE
Nitrite: NEGATIVE
PH: 6 (ref 4.5–8.0)
Specific Gravity: 1.024 (ref 1.003–1.030)
WBC UR: NONE SEEN /HPF (ref 0–5)

## 2014-02-12 LAB — CBC
HCT: 45.2 % (ref 35.0–47.0)
HGB: 13.3 g/dL (ref 12.0–16.0)
MCH: 24.2 pg — AB (ref 26.0–34.0)
MCHC: 29.5 g/dL — ABNORMAL LOW (ref 32.0–36.0)
MCV: 82 fL (ref 80–100)
PLATELETS: 335 10*3/uL (ref 150–440)
RBC: 5.51 10*6/uL — AB (ref 3.80–5.20)
RDW: 23.8 % — AB (ref 11.5–14.5)
WBC: 9 10*3/uL (ref 3.6–11.0)

## 2014-12-12 LAB — HM DIABETES EYE EXAM

## 2015-03-04 NOTE — H&P (Signed)
PATIENT NAME:  Courtney Stafford, Courtney Stafford MR#:  [id number] DATE OF BIRTH:  Aug 13, 1971  DATE OF ADMISSION:  11/13/2012  PRIMARY CARE PHYSICIAN: [facility]  REFERRING PHYSICIAN: Lowella FairyJohn Woodruff, MD   CHIEF COMPLAINT: Vomiting, unable to hydrate herself, headache and excessive thirst.   HISTORY OF PRESENT ILLNESS: Ms. Courtney Stafford is a 44 year old Caucasian female with history of diabetes mellitus type 2, on insulin. She ran out of her insulin about 2 weeks ago. She also did not take her blood pressure medication since November. The patient states that over the last two days becoming excessively thirsty, had repetitive vomiting and developed headache and backache. She does not know her blood sugar because she ran out of her glucometer strips. However, upon calling EMS, her blood sugar checked at home was 507. The patient was brought here for evaluation and found to have diabetic ketoacidosis. The patient also was tested positive for cocaine and the last usage was 2 days ago. Otherwise, she denies having any fever, no chills, no cough out of the usual, no abdominal pain, no diarrhea and no hematochezia.   REVIEW OF SYSTEMS:  CONSTITUTIONAL: No fever, no chills and no weight gain, but she has fatigue.   EYES: No blurring of vision and no double vision.   ENT: No hearing impairment, no sore throat and no dysphagia, but she has dry mouth.   CARDIOVASCULAR: No chest pain, no shortness of breath, no syncope and no edema.   RESPIRATORY: No sputum production, no hemoptysis, no chest pain and no shortness of breath.   GASTROINTESTINAL: No abdominal pain but reports vomiting. No diarrhea, no hematochezia and no melena.   GENITOURINARY: No dysuria and no frequency of urination.   MUSCULOSKELETAL: No joint pain or swelling other than her back is aching since she got sick. No muscular pain or swelling.   INTEGUMENTARY: No skin rash and no ulcers.   NEUROLOGY: No focal weakness and no seizure activity, but  reports some headache.   PSYCHIATRY: No anxiety and no depression.   ENDOCRINE: Reported polyuria and polydipsia. No heat or cold intolerance.   HEMATOLOGY: No easy bruisability and no lymph node enlargement.   LABS/RADIOLOGIC STUDIES: EKG showed sinus tachycardia at rate of 118 per minute, right atrial enlargement, nonspecific T wave abnormalities and poor progression of R waves in the anterior chest leads.   Chest x-ray showed no acute cardiopulmonary abnormality.  Serum glucose was 426, BUN 15, creatinine 0.8, sodium 134 and potassium 5.1. Her serum bicarbonate is 10. Anion gap is elevated at 21. Hemoglobin A1c was 12.1. Serum protein was 8.3, albumin 3.9 and bilirubin 0.4. Alkaline phosphatase is 142, AST 15 and ALT 18. Troponin is less than 0.02. CPK was normal. Urine drug screen was positive for cocaine. CBC showed white count of 12,000, hemoglobin 15.8, hematocrit 47 and platelet count 349.   Urinalysis was unremarkable, except for more than 500 glucose.   Pregnancy urine test was negative.   Venous pH was 7.07 and pCO2 20.   ASSESSMENT: 1. Diabetic ketoacidosis secondary to noncompliance with medications and nonadherence to Lantus insulin treatment.  2. Cocaine abuse.  3. Diabetic neuropathy.  4. Iron deficiency anemia. 5. Tobacco abuse.   PLAN: We will admit the patient to the intensive care unit and  IV hydration started with normal saline. She received total 4 liters of normal saline. We will continue that at rate of 200 mL per hour. IV insulin drip and monitoring electrolytes, blood sugar and potassium. I will continue her  home medications with the exception of holding the diuretic and also I will hold the Lantus. I will hold iron until her vomiting is controlled. I explained to the patient her risk for cardiovascular event if she continues to smoke cigarettes and abuse cocaine in the face of having uncontrolled diabetes.   TIME SPENT: In evaluating this patient and reviewing  medical records took more than 55 minutes. ____________________________ Carney Corners. Rudene Re, MD amd:sb D: 11/13/2012 01:46:23 ET T: 11/13/2012 13:12:15 ET JOB#: [id number]  cc: Carney Corners. Rudene Re, MD, <Dictator> Zollie Scale MD ELECTRONICALLY SIGNED 11/14/2012 6:32

## 2015-03-07 NOTE — Discharge Summary (Signed)
PATIENT NAME:  Courtney Stafford, Courtney Stafford MR#:  [id number] DATE OF BIRTH:  May 26, 1971  DATE OF ADMISSION:  11/13/2012 DATE OF DISCHARGE:  11/14/2012  PRIMARY CARE PHYSICIAN: At the [facility].   FINAL DIAGNOSES:  1.  Diabetic ketoacidosis.  2.  Hypertension.  3.  Stress.  4.  Cocaine abuse.  5.  Tobacco abuse.   MEDICATIONS ON DISCHARGE: Include lisinopril 1/2 tablet daily, Lantus 25 units subcutaneous injection at bedtime starting 11/15/2012, insulin sliding scale.   DIET: Low sodium diet, carbohydrate controlled diet, regular consistency.   ACTIVITY: Activity as tolerated.   FOLLOWUP: In 2 to 4 weeks at the [facility].   REASON FOR ADMISSION: The patient was admitted 11/13/2012, discharged 11/14/2012. Came in with vomiting, unable to hydrate herself, headaches, excessive thirst.   HISTORY OF PRESENT ILLNESS: This 44 year old female with diabetes, on insulin, ran out of her insulin 2 weeks ago. Fingerstick at home was 507. In the ER, she was found to be in diabetic ketoacidosis. The patient was admitted to the CCU and started on insulin drip, hyperglycemia protocol.   LABORATORY AND RADIOLOGICAL DURING THE HOSPITAL COURSE: Included first fingerstick of 426. Hemoglobin A1c 12.2. Pregnancy test negative. Magnesium 2.2, phosphorus 3.6. Troponin negative. Urine toxicology positive for cocaine. Urinalysis: Trace leukocyte esterase, 2+ ketones, greater than 500 mg/dL of glucose. Glucose 398, BUN 15, creatinine 0.83, sodium 134, potassium 5.1, chloride 103, CO2 of 10, calcium 8.9, alkaline phosphatase 142, AST 18, ALT 15, anion gap 21. White blood cell count 12.4, hemoglobin and hematocrit 15.8 and 47.9, platelet count 349. Chest x-ray: No acute cardiopulmonary disease. EKG: Sinus tachycardia, right atrial enlargement. Venous ABG showed a pH of 7.07. Repeat chemistry showed a CO2 of 12. Chemistry upon discharge: BUN 6, creatinine 0.56, sodium 143, potassium 3.2, chloride 114, CO2 of 21, calcium  7.7.   HOSPITAL COURSE PER PROBLEM LIST:  1.  For the patient's diabetic ketoacidosis, the patient was started on insulin drip and admitted to the ICU. The conversion per protocol had the patient on Lantus 50 units, but the patient normally takes 35 at home. I gave 40 units subcutaneous injection once at 12 noon and sliding scale. The patient did have a low sugar in the morning. She did not eat an evening snack. The patient was asymptomatic. Sugar was 41 on chemistry, 38 on fingerstick. It came up to 60, then 84, 194 and 288. I decreased the dose of the Lantus to 15 units on the 31st at 12 noon. I think the patient can do another 8 units. The patient will be discharged home on 25 units of Lantus insulin. If the sugars remain above 200, can go up 3 units every day until sugar is under the 160 range. The patient will be given short-acting insulin. The patient was advised to eat how she normally eats. Normally she takes an evening snack. She did not eat an evening snack prior to the low sugar. Care manager helped out patient with test strips, vials of insulin until they are able to get to the [facility]. I advised that she must take insulin or she is going to end up in the hospital. The patient was sent home on a lower dose insulin than she normally takes at home.  2.  Hypertension: Blood pressure is on the lower side. A lower dose of lisinopril given upon discharge, 2.5 mg daily.  3.  Stress: The patient had no suicidal ideation, declined psychiatric consultation while here.  4.  Cocaine  abuse: I advised against cocaine abuse. This must be stopped. I did tell her about complications with crack cocaine abuse.  5.  Tobacco abuse: Smoking cessation and counseling done, 3 minutes by me, cold [place] by the patient. No medications prescribed.   TIME SPENT ON DISCHARGE: 35 minutes.    ____________________________ Herschell Dimesichard J. Renae GlossWieting, MD rjw:jm D: 11/14/2012 17:27:27 ET T: 11/14/2012 20:19:27  ET JOB#: [id number]  cc: Herschell Dimesichard J. Renae GlossWieting, MD, <Dictator> [facility] Salley ScarletICHARD J Moriah Loughry MD ELECTRONICALLY SIGNED 11/16/2012 19:08

## 2015-04-02 ENCOUNTER — Other Ambulatory Visit: Payer: Self-pay

## 2015-04-09 ENCOUNTER — Ambulatory Visit: Payer: Self-pay | Admitting: Internal Medicine

## 2015-04-09 DIAGNOSIS — R319 Hematuria, unspecified: Secondary | ICD-10-CM | POA: Insufficient documentation

## 2015-07-02 ENCOUNTER — Other Ambulatory Visit: Payer: Self-pay

## 2015-07-09 ENCOUNTER — Ambulatory Visit: Payer: Self-pay | Admitting: Internal Medicine

## 2015-10-15 ENCOUNTER — Other Ambulatory Visit: Payer: Self-pay

## 2015-10-15 LAB — BASIC METABOLIC PANEL
BUN: 14 mg/dL (ref 4–21)
CREATININE: 1 mg/dL (ref 0.5–1.1)
Glucose: 342 mg/dL
Potassium: 4.7 mmol/L (ref 3.4–5.3)
Sodium: 135 mmol/L — AB (ref 137–147)

## 2015-10-15 LAB — LIPID PANEL
CHOLESTEROL: 159 mg/dL (ref 0–200)
HDL: 65 mg/dL (ref 35–70)
LDL CALC: 66 mg/dL
Triglycerides: 138 mg/dL (ref 40–160)

## 2015-10-15 LAB — CBC AND DIFFERENTIAL
HEMATOCRIT: 34 % — AB (ref 36–46)
HEMOGLOBIN: 10.9 g/dL — AB (ref 12.0–16.0)
NEUTROS ABS: 6 /uL
PLATELETS: 342 10*3/uL (ref 150–399)
WBC: 8.3 10*3/mL

## 2015-10-15 LAB — HEPATIC FUNCTION PANEL
ALK PHOS: 84 U/L (ref 25–125)
ALT: 12 U/L (ref 7–35)
AST: 16 U/L (ref 13–35)

## 2015-10-15 LAB — HEMOGLOBIN A1C: Hemoglobin A1C: 8.2

## 2015-10-22 ENCOUNTER — Ambulatory Visit: Payer: Self-pay | Admitting: Internal Medicine

## 2015-10-22 DIAGNOSIS — E109 Type 1 diabetes mellitus without complications: Secondary | ICD-10-CM | POA: Insufficient documentation

## 2015-10-22 DIAGNOSIS — I10 Essential (primary) hypertension: Secondary | ICD-10-CM | POA: Insufficient documentation

## 2015-10-22 DIAGNOSIS — D649 Anemia, unspecified: Secondary | ICD-10-CM | POA: Insufficient documentation

## 2015-10-22 DIAGNOSIS — G2581 Restless legs syndrome: Secondary | ICD-10-CM | POA: Insufficient documentation

## 2015-11-13 ENCOUNTER — Ambulatory Visit: Payer: Self-pay | Admitting: Ophthalmology

## 2015-11-20 ENCOUNTER — Ambulatory Visit: Payer: Self-pay | Admitting: Ophthalmology

## 2015-12-26 DIAGNOSIS — G2581 Restless legs syndrome: Secondary | ICD-10-CM

## 2015-12-26 DIAGNOSIS — I10 Essential (primary) hypertension: Secondary | ICD-10-CM

## 2015-12-26 DIAGNOSIS — R319 Hematuria, unspecified: Secondary | ICD-10-CM

## 2015-12-26 DIAGNOSIS — D649 Anemia, unspecified: Secondary | ICD-10-CM

## 2015-12-26 DIAGNOSIS — Z794 Long term (current) use of insulin: Secondary | ICD-10-CM

## 2015-12-26 DIAGNOSIS — E119 Type 2 diabetes mellitus without complications: Secondary | ICD-10-CM

## 2016-01-21 ENCOUNTER — Other Ambulatory Visit: Payer: Self-pay

## 2016-01-22 LAB — LIPID PANEL
CHOLESTEROL TOTAL: 189 mg/dL (ref 100–199)
Chol/HDL Ratio: 2.3 ratio units (ref 0.0–4.4)
HDL: 84 mg/dL (ref >39)
LDL Calculated: 79 mg/dL (ref 0–99)
Triglycerides: 130 mg/dL (ref 0–149)
VLDL CHOLESTEROL CAL: 26 mg/dL (ref 5–40)

## 2016-01-22 LAB — CBC WITH DIFFERENTIAL/PLATELET
Basophils Absolute: 0.1 10*3/uL (ref 0.0–0.2)
Basos: 1 %
EOS (ABSOLUTE): 0.4 10*3/uL (ref 0.0–0.4)
EOS: 6 %
HEMATOCRIT: 39.8 % (ref 34.0–46.6)
HEMOGLOBIN: 12.1 g/dL (ref 11.1–15.9)
IMMATURE GRANS (ABS): 0 10*3/uL (ref 0.0–0.1)
IMMATURE GRANULOCYTES: 0 %
Lymphocytes Absolute: 1.2 10*3/uL (ref 0.7–3.1)
Lymphs: 20 %
MCH: 24.1 pg — ABNORMAL LOW (ref 26.6–33.0)
MCHC: 30.4 g/dL — ABNORMAL LOW (ref 31.5–35.7)
MCV: 79 fL (ref 79–97)
MONOS ABS: 0.6 10*3/uL (ref 0.1–0.9)
Monocytes: 10 %
NEUTROS PCT: 63 %
Neutrophils Absolute: 3.8 10*3/uL (ref 1.4–7.0)
Platelets: 353 10*3/uL (ref 150–379)
RBC: 5.03 x10E6/uL (ref 3.77–5.28)
RDW: 17.6 % — AB (ref 12.3–15.4)
WBC: 6 10*3/uL (ref 3.4–10.8)

## 2016-01-22 LAB — HEMOGLOBIN A1C
ESTIMATED AVERAGE GLUCOSE: 255 mg/dL
Hgb A1c MFr Bld: 10.5 % — ABNORMAL HIGH (ref 4.8–5.6)

## 2016-01-22 LAB — COMPREHENSIVE METABOLIC PANEL
ALT: 15 IU/L (ref 0–32)
AST: 18 IU/L (ref 0–40)
Albumin/Globulin Ratio: 1.4 (ref 1.1–2.5)
Albumin: 4 g/dL (ref 3.5–5.5)
Alkaline Phosphatase: 88 IU/L (ref 39–117)
BUN/Creatinine Ratio: 19 (ref 9–23)
BUN: 16 mg/dL (ref 6–24)
Bilirubin Total: 0.2 mg/dL (ref 0.0–1.2)
CALCIUM: 9.3 mg/dL (ref 8.7–10.2)
CO2: 21 mmol/L (ref 18–29)
CREATININE: 0.86 mg/dL (ref 0.57–1.00)
Chloride: 93 mmol/L — ABNORMAL LOW (ref 96–106)
GFR calc Af Amer: 94 mL/min/{1.73_m2} (ref >59)
GFR, EST NON AFRICAN AMERICAN: 81 mL/min/{1.73_m2} (ref >59)
GLOBULIN, TOTAL: 2.8 g/dL (ref 1.5–4.5)
Glucose: 358 mg/dL — ABNORMAL HIGH (ref 65–99)
Potassium: 4.8 mmol/L (ref 3.5–5.2)
SODIUM: 133 mmol/L — AB (ref 134–144)
TOTAL PROTEIN: 6.8 g/dL (ref 6.0–8.5)

## 2016-02-04 ENCOUNTER — Ambulatory Visit: Payer: Self-pay | Admitting: Internal Medicine

## 2016-02-04 ENCOUNTER — Encounter: Payer: Self-pay | Admitting: Internal Medicine

## 2016-02-04 VITALS — BP 137/91 | HR 77 | Wt 178.0 lb

## 2016-02-04 DIAGNOSIS — G2581 Restless legs syndrome: Secondary | ICD-10-CM

## 2016-02-04 DIAGNOSIS — Z794 Long term (current) use of insulin: Secondary | ICD-10-CM

## 2016-02-04 DIAGNOSIS — E119 Type 2 diabetes mellitus without complications: Secondary | ICD-10-CM

## 2016-02-04 DIAGNOSIS — I10 Essential (primary) hypertension: Secondary | ICD-10-CM

## 2016-02-04 MED ORDER — GABAPENTIN 300 MG PO CAPS: 300.0000 mg | ORAL_CAPSULE | Freq: Every day | ORAL | Status: DC

## 2016-02-04 NOTE — Assessment & Plan Note (Signed)
Responded to Gabapentin. Renew Gabapentin.

## 2016-02-04 NOTE — Progress Notes (Signed)
   Subjective:    Patient ID: Courtney Stafford, female    DOB: 10/07/1971, 45 y.o.   MRN: [id number]  HPI  Pt presents with f/u for diabetes. Pt had a blood glucose reading of 108 this morning. Last A1c was elevated. Pt checks her sugars before breakfast once a day, sometimes twice. Pt reports shakiness at night. Monitored sugars vary with some being up in the morning. Concerned that sugars are dropping in the night due to Lantus.   Pt presents with a f/u for htn. Her BP this morning was elevated.   Reports restless legs Syndrome? Better with Neurtontin..  Review of Systems     Objective:   Physical Exam  Constitutional: She is oriented to person, place, and time.  Cardiovascular: Normal rate, regular rhythm and normal heart sounds.   Pulmonary/Chest: Effort normal and breath sounds normal.  Neurological: She is alert and oriented to person, place, and time.      Medication List       This list is accurate as of: 02/04/16  9:00 AM.  Always use your most recent med list.               amLODipine 5 MG tablet  Commonly known as:  NORVASC  Take 5 mg by mouth daily.     gabapentin 300 MG capsule  Commonly known as:  NEURONTIN  Take 300 mg by mouth at bedtime.     hydrochlorothiazide 25 MG tablet  Commonly known as:  HYDRODIURIL  Take 12.5 mg by mouth daily.     insulin aspart 100 UNIT/ML injection  Commonly known as:  novoLOG  Inject 50 Units into the skin 3 (three) times daily before meals.     insulin glargine 100 UNIT/ML injection  Commonly known as:  LANTUS  Inject 250 Units into the skin at bedtime.     lisinopril 20 MG tablet  Commonly known as:  PRINIVIL,ZESTRIL  Take 20 mg by mouth daily.       BP 137/91 mmHg  Pulse 77  Wt 178 lb (80.74 kg)  Blood glucose: 108      Assessment & Plan:  Diabetes (HCC) Reduce Lantus to 20 units at night. Check sugars alternating, one day before breakfast, one day before lunch, one day at night.   HTN  (hypertension) Improved but not at target. No med adjustment.   Restless legs Responded to Gabapentin. Renew Gabapentin.      Come back in 90 days  Met C, CBC, urinalysis, lipids

## 2016-02-04 NOTE — Assessment & Plan Note (Signed)
Improved but not at target. No med adjustment.

## 2016-02-04 NOTE — Assessment & Plan Note (Signed)
Reduce Lantus to 20 units at night. Check sugars alternating, one day before breakfast, one day before lunch, one day at night.

## 2016-04-28 ENCOUNTER — Encounter: Payer: Self-pay | Admitting: Internal Medicine

## 2016-04-28 ENCOUNTER — Ambulatory Visit: Payer: Self-pay | Admitting: Internal Medicine

## 2016-04-28 VITALS — BP 169/103 | HR 88 | Temp 98.5°F | Wt 172.0 lb

## 2016-04-28 DIAGNOSIS — Z794 Long term (current) use of insulin: Principal | ICD-10-CM

## 2016-04-28 DIAGNOSIS — M79673 Pain in unspecified foot: Secondary | ICD-10-CM

## 2016-04-28 DIAGNOSIS — I10 Essential (primary) hypertension: Secondary | ICD-10-CM

## 2016-04-28 DIAGNOSIS — E119 Type 2 diabetes mellitus without complications: Secondary | ICD-10-CM

## 2016-04-28 LAB — GLUCOSE, POCT (MANUAL RESULT ENTRY): POC Glucose: 205 mg/dl — AB (ref 70–99)

## 2016-04-28 MED ORDER — AMLODIPINE BESYLATE 5 MG PO TABS: 5.0000 mg | ORAL_TABLET | Freq: Two times a day (BID) | ORAL | Status: DC

## 2016-04-28 NOTE — Progress Notes (Signed)
   Subjective:    Patient ID: Courtney Stafford, female    DOB: April 24, 1971, 45 y.o.   MRN: [id number]  HPI   Patient presents for follow up on diabetes.  Patient still experiencing sweating.  BP is elevated today.  Patient checks glucose regularly.  Glucose is ranging from 100's to 400's. Patient complains with a knot on her foot.  Patient Active Problem List   Diagnosis Date Noted  . HTN (hypertension) 10/22/2015  . Diabetes (HCC) 10/22/2015  . Anemia 10/22/2015  . Restless legs 10/22/2015  . Hematuria 04/09/2015     Review of Systems     Objective:   Physical Exam  Constitutional: She is oriented to person, place, and time.  Cardiovascular: Normal rate, regular rhythm and normal heart sounds.   Pulmonary/Chest: Effort normal and breath sounds normal.  Neurological: She is alert and oriented to person, place, and time.    There were no vitals taken for this visit.   Medication List       This list is accurate as of: 04/28/16  9:09 AM.  Always use your most recent med list.               amLODipine 5 MG tablet  Commonly known as:  NORVASC  Take 5 mg by mouth daily.     gabapentin 300 MG capsule  Commonly known as:  NEURONTIN  Take 1 capsule (300 mg total) by mouth at bedtime.     hydrochlorothiazide 25 MG tablet  Commonly known as:  HYDRODIURIL  Take 12.5 mg by mouth daily.     insulin aspart 100 UNIT/ML injection  Commonly known as:  novoLOG  Inject 50 Units into the skin 3 (three) times daily before meals.     insulin glargine 100 UNIT/ML injection  Commonly known as:  LANTUS  Inject 20 Units into the skin at bedtime.     lisinopril 20 MG tablet  Commonly known as:  PRINIVIL,ZESTRIL  Take 20 mg by mouth daily.             Assessment & Plan:  Patient has hard lesions on feet and toes which could be corns and plantar warts.  Needs to see a podiatrist due to being diabetic.  Increase Norvasc to 5mg  2 tablets daily.  Follow up in 2 weeks. Labs today:   JA1c, MetC, CBC  Labs in 3 months: A1c, MetC, CBC, UA adn lipids

## 2016-04-28 NOTE — Patient Instructions (Addendum)
Patient needs referral to podiatrist. Patient to follow up in 3 months with labs before.

## 2016-04-28 NOTE — Addendum Note (Signed)
Addended by: Debby BudHALE, Shannette Tabares S on: 04/28/2016 10:01 AM   Modules accepted: Orders

## 2016-04-29 LAB — CBC WITH DIFFERENTIAL/PLATELET
BASOS: 0 %
Basophils Absolute: 0 10*3/uL (ref 0.0–0.2)
EOS (ABSOLUTE): 0.5 10*3/uL — AB (ref 0.0–0.4)
EOS: 7 %
HEMOGLOBIN: 15 g/dL (ref 11.1–15.9)
Hematocrit: 46.7 % — ABNORMAL HIGH (ref 34.0–46.6)
IMMATURE GRANS (ABS): 0 10*3/uL (ref 0.0–0.1)
IMMATURE GRANULOCYTES: 0 %
LYMPHS: 30 %
Lymphocytes Absolute: 2.2 10*3/uL (ref 0.7–3.1)
MCH: 27.1 pg (ref 26.6–33.0)
MCHC: 32.1 g/dL (ref 31.5–35.7)
MCV: 84 fL (ref 79–97)
MONOCYTES: 11 %
Monocytes Absolute: 0.8 10*3/uL (ref 0.1–0.9)
NEUTROS PCT: 52 %
Neutrophils Absolute: 3.6 10*3/uL (ref 1.4–7.0)
PLATELETS: 357 10*3/uL (ref 150–379)
RBC: 5.54 x10E6/uL — ABNORMAL HIGH (ref 3.77–5.28)
RDW: 16.1 % — ABNORMAL HIGH (ref 12.3–15.4)
WBC: 7.2 10*3/uL (ref 3.4–10.8)

## 2016-04-29 LAB — URINALYSIS
Bilirubin, UA: NEGATIVE
Ketones, UA: NEGATIVE
LEUKOCYTES UA: NEGATIVE
NITRITE UA: NEGATIVE
PH UA: 5.5 (ref 5.0–7.5)
RBC, UA: NEGATIVE
Specific Gravity, UA: 1.022 (ref 1.005–1.030)
Urobilinogen, Ur: 0.2 mg/dL (ref 0.2–1.0)

## 2016-04-29 LAB — COMPREHENSIVE METABOLIC PANEL
A/G RATIO: 1.4 (ref 1.2–2.2)
ALT: 17 IU/L (ref 0–32)
AST: 19 IU/L (ref 0–40)
Albumin: 4.2 g/dL (ref 3.5–5.5)
Alkaline Phosphatase: 102 IU/L (ref 39–117)
BUN/Creatinine Ratio: 14 (ref 9–23)
BUN: 11 mg/dL (ref 6–24)
CALCIUM: 9.5 mg/dL (ref 8.7–10.2)
CHLORIDE: 95 mmol/L — AB (ref 96–106)
CO2: 25 mmol/L (ref 18–29)
Creatinine, Ser: 0.77 mg/dL (ref 0.57–1.00)
GFR calc Af Amer: 107 mL/min/{1.73_m2} (ref >59)
GFR, EST NON AFRICAN AMERICAN: 93 mL/min/{1.73_m2} (ref >59)
Globulin, Total: 3 g/dL (ref 1.5–4.5)
Glucose: 294 mg/dL — ABNORMAL HIGH (ref 65–99)
POTASSIUM: 3.8 mmol/L (ref 3.5–5.2)
Sodium: 139 mmol/L (ref 134–144)
Total Protein: 7.2 g/dL (ref 6.0–8.5)

## 2016-04-29 LAB — HEMOGLOBIN A1C
Est. average glucose Bld gHb Est-mCnc: 229 mg/dL
Hgb A1c MFr Bld: 9.6 % — ABNORMAL HIGH (ref 4.8–5.6)

## 2016-04-29 LAB — CHOLESTEROL, TOTAL: CHOLESTEROL TOTAL: 218 mg/dL — AB (ref 100–199)

## 2016-05-12 ENCOUNTER — Ambulatory Visit: Payer: Self-pay | Admitting: Internal Medicine

## 2016-05-12 VITALS — BP 166/112 | HR 83

## 2016-05-12 DIAGNOSIS — I152 Hypertension secondary to endocrine disorders: Secondary | ICD-10-CM

## 2016-05-12 NOTE — Progress Notes (Signed)
   Subjective:    Patient ID: Courtney GaribaldiAngela M Stafford, female    DOB: 02-07-1971, 45 y.o.   MRN: [id number]  HPI  Patient not seen by doctor since she had to leave early. Was in for follow up for blood pressure. BP medications had been increased on previous visit.   Patient Active Problem List   Diagnosis Date Noted  . HTN (hypertension) 10/22/2015  . Diabetes (HCC) 10/22/2015  . Anemia 10/22/2015  . Restless legs 10/22/2015  . Hematuria 04/09/2015     Medication List       This list is accurate as of: 05/12/16 10:07 AM.  Always use your most recent med list.               amLODipine 5 MG tablet  Commonly known as:  NORVASC  Take 1 tablet (5 mg total) by mouth 2 (two) times daily.     gabapentin 300 MG capsule  Commonly known as:  NEURONTIN  Take 1 capsule (300 mg total) by mouth at bedtime.     hydrochlorothiazide 25 MG tablet  Commonly known as:  HYDRODIURIL  Take 25 mg by mouth daily.     insulin aspart 100 UNIT/ML injection  Commonly known as:  novoLOG  Inject 50 Units into the skin 3 (three) times daily before meals.     insulin glargine 100 UNIT/ML injection  Commonly known as:  LANTUS  Inject 20 Units into the skin at bedtime.     lisinopril 20 MG tablet  Commonly known as:  PRINIVIL,ZESTRIL  Take 20 mg by mouth daily.          Review of Systems     Objective:   Physical Exam  BP 166/112 mmHg  Pulse 83       Assessment & Plan:  Blood pressure is still not at target. Nurse to call pt and tell her to increase hydrochlorothiazide from 12.5mg  to 25mg  daily  (from .5 tablet to 1 whole tablet daily).  Patient should come in within 1 week for blood pressure check

## 2016-05-12 NOTE — Patient Instructions (Addendum)
Patient came in for a BP check only. Was not seen by doctor. Call pt. To increase medication (see note)

## 2016-05-19 ENCOUNTER — Ambulatory Visit: Payer: Self-pay | Admitting: Internal Medicine

## 2016-05-19 VITALS — BP 142/97 | HR 84 | Temp 98.1°F | Wt 171.0 lb

## 2016-05-19 DIAGNOSIS — Z794 Long term (current) use of insulin: Principal | ICD-10-CM

## 2016-05-19 DIAGNOSIS — E119 Type 2 diabetes mellitus without complications: Secondary | ICD-10-CM

## 2016-05-19 DIAGNOSIS — L84 Corns and callosities: Secondary | ICD-10-CM

## 2016-05-19 LAB — GLUCOSE, POCT (MANUAL RESULT ENTRY): POC Glucose: 164 mg/dl — AB (ref 70–99)

## 2016-05-19 MED ORDER — HYDROCHLOROTHIAZIDE 25 MG PO TABS: 25.0000 mg | ORAL_TABLET | Freq: Every day | ORAL | Status: DC

## 2016-05-19 NOTE — Patient Instructions (Addendum)
Follow up in 4 weeks with no labs. Referral to podiatrist for calluses on left foot

## 2016-05-19 NOTE — Progress Notes (Signed)
   Subjective:    Patient ID: Courtney GaribaldiAngela M Metheney, female    DOB: 13-Oct-1971, 45 y.o.   MRN: [id number]  HPI Patient presents today with follow up for diabetes and hypertension. Patient states she has drowsiness after taking some of her medications. Discovered that patient was only taking 12.5mg  of hydrochlorothiazide instead of the 25mg  listed.  Patient also has pain in her left foot with callus corns.   Patient Active Problem List   Diagnosis Date Noted  . HTN (hypertension) 10/22/2015  . Diabetes (HCC) 10/22/2015  . Anemia 10/22/2015  . Restless legs 10/22/2015  . Hematuria 04/09/2015     Medication List       This list is accurate as of: 05/19/16  9:15 AM.  Always use your most recent med list.               amLODipine 5 MG tablet  Commonly known as:  NORVASC  Take 1 tablet (5 mg total) by mouth 2 (two) times daily.     gabapentin 300 MG capsule  Commonly known as:  NEURONTIN  Take 1 capsule (300 mg total) by mouth at bedtime.     hydrochlorothiazide 25 MG tablet  Commonly known as:  HYDRODIURIL  Take 25 mg by mouth daily.     insulin aspart 100 UNIT/ML injection  Commonly known as:  novoLOG  Inject 50 Units into the skin 3 (three) times daily before meals.     insulin glargine 100 UNIT/ML injection  Commonly known as:  LANTUS  Inject 20 Units into the skin at bedtime.     lisinopril 20 MG tablet  Commonly known as:  PRINIVIL,ZESTRIL  Take 20 mg by mouth daily.         Review of Systems Left foot contains callus corns.     Objective:   Physical Exam  Constitutional: She is oriented to person, place, and time.  Cardiovascular: Normal rate and regular rhythm.   Pulmonary/Chest: Effort normal and breath sounds normal.  Neurological: She is alert and oriented to person, place, and time.   BP 142/97 mmHg  Pulse 84  Temp(Src) 98.1 F (36.7 C)  Wt 171 lb (77.565 kg)         Assessment & Plan:  Patient needs to follow up in 4 weeks with no labs.    Referral to podiatrist for calluses corns on left foot  Change hydrochlorothiazide to 25mg  from 12.5mg . For any medicatiion currently remaining patient needs to take 2 pills of the 12.5mg  until she switches to 25mg  tablets.

## 2016-06-16 ENCOUNTER — Ambulatory Visit: Payer: Self-pay | Admitting: Internal Medicine

## 2016-07-14 ENCOUNTER — Ambulatory Visit: Payer: Self-pay | Admitting: Internal Medicine

## 2016-07-14 ENCOUNTER — Encounter: Payer: Self-pay | Admitting: Internal Medicine

## 2016-07-14 VITALS — BP 152/97 | HR 70 | Wt 175.0 lb

## 2016-07-14 DIAGNOSIS — Z794 Long term (current) use of insulin: Principal | ICD-10-CM

## 2016-07-14 DIAGNOSIS — I10 Essential (primary) hypertension: Secondary | ICD-10-CM

## 2016-07-14 DIAGNOSIS — E119 Type 2 diabetes mellitus without complications: Secondary | ICD-10-CM

## 2016-07-14 LAB — GLUCOSE, POCT (MANUAL RESULT ENTRY): POC GLUCOSE: 128 mg/dL — AB (ref 70–99)

## 2016-07-14 MED ORDER — LISINOPRIL 20 MG PO TABS: 20.0000 mg | ORAL_TABLET | Freq: Every day | ORAL | 2 refills | Status: DC

## 2016-07-14 MED ORDER — HYDROCHLOROTHIAZIDE 25 MG PO TABS: 25.0000 mg | ORAL_TABLET | Freq: Every day | ORAL | 3 refills | Status: DC

## 2016-07-14 MED ORDER — GABAPENTIN 300 MG PO CAPS: 300.0000 mg | ORAL_CAPSULE | Freq: Every day | ORAL | 2 refills | Status: DC

## 2016-07-14 MED ORDER — AMLODIPINE BESYLATE 5 MG PO TABS: 5.0000 mg | ORAL_TABLET | Freq: Two times a day (BID) | ORAL | 12 refills | Status: DC

## 2016-07-14 MED ORDER — INSULIN ASPART 100 UNIT/ML ~~LOC~~ SOLN: 50.0000 [IU] | Freq: Three times a day (TID) | SUBCUTANEOUS | 2 refills | Status: DC

## 2016-07-14 NOTE — Patient Instructions (Signed)
Patient already has appointments at the end of September

## 2016-07-14 NOTE — Progress Notes (Signed)
   Subjective:    Patient ID: Courtney Stafford, female    DOB: 07-16-1971, 45 y.o.   MRN: [id number]  HPI Patient Active Problem List   Diagnosis Date Noted  . HTN (hypertension) 10/22/2015  . Diabetes (HCC) 10/22/2015  . Anemia 10/22/2015  . Restless legs 10/22/2015  . Hematuria 04/09/2015   Blood glucose is good today.    Review of Systems Blood pressure is elevated, recheck 148/90     Objective:   Physical Exam  Constitutional: She is oriented to person, place, and time.  Cardiovascular: Normal rate, regular rhythm and normal heart sounds.   Pulmonary/Chest: Breath sounds normal.  Neurological: She is alert and oriented to person, place, and time.    BP (!) 152/97   Pulse 70   Wt 175 lb (79.4 kg)    Medication List       Accurate as of 07/14/16  9:12 AM. Always use your most recent med list.          amLODipine 5 MG tablet Commonly known as:  NORVASC Take 1 tablet (5 mg total) by mouth 2 (two) times daily.   gabapentin 300 MG capsule Commonly known as:  NEURONTIN Take 1 capsule (300 mg total) by mouth at bedtime.   hydrochlorothiazide 25 MG tablet Commonly known as:  HYDRODIURIL Take 1 tablet (25 mg total) by mouth daily.   insulin aspart 100 UNIT/ML injection Commonly known as:  novoLOG Inject 50 Units into the skin 3 (three) times daily before meals.   insulin glargine 100 UNIT/ML injection Commonly known as:  LANTUS Inject 20 Units into the skin at bedtime.   lisinopril 20 MG tablet Commonly known as:  PRINIVIL,ZESTRIL Take 20 mg by mouth daily.            Assessment & Plan:  Keeping medications the same.  BP still borderline at target  Discussed diet with patient to reduce  hypertension.  Suggested to reduce sodium intake.   Sugar seams stable  Keep previous Lab and appt. scheduled

## 2016-07-28 ENCOUNTER — Other Ambulatory Visit: Payer: Self-pay

## 2016-07-28 DIAGNOSIS — I10 Essential (primary) hypertension: Secondary | ICD-10-CM

## 2016-07-28 DIAGNOSIS — Z794 Long term (current) use of insulin: Principal | ICD-10-CM

## 2016-07-28 DIAGNOSIS — E119 Type 2 diabetes mellitus without complications: Secondary | ICD-10-CM

## 2016-07-29 LAB — CBC WITH DIFFERENTIAL/PLATELET
BASOS: 1 %
Basophils Absolute: 0.1 10*3/uL (ref 0.0–0.2)
EOS (ABSOLUTE): 0.3 10*3/uL (ref 0.0–0.4)
EOS: 4 %
HEMATOCRIT: 43.2 % (ref 34.0–46.6)
Hemoglobin: 14 g/dL (ref 11.1–15.9)
IMMATURE GRANS (ABS): 0 10*3/uL (ref 0.0–0.1)
IMMATURE GRANULOCYTES: 0 %
LYMPHS: 24 %
Lymphocytes Absolute: 1.8 10*3/uL (ref 0.7–3.1)
MCH: 28.3 pg (ref 26.6–33.0)
MCHC: 32.4 g/dL (ref 31.5–35.7)
MCV: 87 fL (ref 79–97)
Monocytes Absolute: 0.6 10*3/uL (ref 0.1–0.9)
Monocytes: 9 %
NEUTROS PCT: 62 %
Neutrophils Absolute: 4.6 10*3/uL (ref 1.4–7.0)
PLATELETS: 360 10*3/uL (ref 150–379)
RBC: 4.95 x10E6/uL (ref 3.77–5.28)
RDW: 15 % (ref 12.3–15.4)
WBC: 7.4 10*3/uL (ref 3.4–10.8)

## 2016-07-29 LAB — URINALYSIS
BILIRUBIN UA: NEGATIVE
Ketones, UA: NEGATIVE
Leukocytes, UA: NEGATIVE
NITRITE UA: NEGATIVE
PROTEIN UA: NEGATIVE
RBC, UA: NEGATIVE
Specific Gravity, UA: >=1.03 — AB (ref 1.005–1.030)
UUROB: 0.2 mg/dL (ref 0.2–1.0)
pH, UA: 6 (ref 5.0–7.5)

## 2016-07-29 LAB — COMPREHENSIVE METABOLIC PANEL
ALK PHOS: 92 IU/L (ref 39–117)
ALT: 9 IU/L (ref 0–32)
AST: 15 IU/L (ref 0–40)
Albumin/Globulin Ratio: 1.5 (ref 1.2–2.2)
Albumin: 4 g/dL (ref 3.5–5.5)
BUN/Creatinine Ratio: 18 (ref 9–23)
BUN: 13 mg/dL (ref 6–24)
Bilirubin Total: <0.2 mg/dL (ref 0.0–1.2)
CALCIUM: 9.1 mg/dL (ref 8.7–10.2)
CO2: 26 mmol/L (ref 18–29)
CREATININE: 0.72 mg/dL (ref 0.57–1.00)
Chloride: 96 mmol/L (ref 96–106)
GFR calc Af Amer: 115 mL/min/{1.73_m2} (ref >59)
GFR, EST NON AFRICAN AMERICAN: 100 mL/min/{1.73_m2} (ref >59)
GLUCOSE: 236 mg/dL — AB (ref 65–99)
Globulin, Total: 2.7 g/dL (ref 1.5–4.5)
Potassium: 4.8 mmol/L (ref 3.5–5.2)
Sodium: 136 mmol/L (ref 134–144)
Total Protein: 6.7 g/dL (ref 6.0–8.5)

## 2016-07-29 LAB — CHOLESTEROL, TOTAL: CHOLESTEROL TOTAL: 185 mg/dL (ref 100–199)

## 2016-07-29 LAB — MICROALBUMIN, URINE: MICROALBUM., U, RANDOM: 8.2 ug/mL

## 2016-07-29 LAB — HEMOGLOBIN A1C
ESTIMATED AVERAGE GLUCOSE: 235 mg/dL
HEMOGLOBIN A1C: 9.8 % — AB (ref 4.8–5.6)

## 2016-08-04 ENCOUNTER — Ambulatory Visit: Payer: Self-pay | Admitting: Internal Medicine

## 2016-08-04 ENCOUNTER — Encounter: Payer: Self-pay | Admitting: Internal Medicine

## 2016-08-04 VITALS — BP 124/88 | HR 70 | Temp 97.7°F | Wt 171.0 lb

## 2016-08-04 DIAGNOSIS — E118 Type 2 diabetes mellitus with unspecified complications: Secondary | ICD-10-CM

## 2016-08-04 MED ORDER — INSULIN ASPART 100 UNIT/ML ~~LOC~~ SOLN: 5.0000 [IU] | Freq: Three times a day (TID) | SUBCUTANEOUS | 2 refills | Status: DC

## 2016-08-04 NOTE — Patient Instructions (Signed)
F/u 3 months. Labs prior to visit a1c/met c/ cbc/ Ua/ micro. Give info for endocinology clinic.

## 2016-08-04 NOTE — Progress Notes (Signed)
   Subjective:    Patient ID: Courtney Stafford, female    DOB: 21-Oct-1971, 45 y.o.   MRN: [id number]  HPI Patient Active Problem List   Diagnosis Date Noted  . HTN (hypertension) 10/22/2015  . Diabetes ([facility]) 10/22/2015  . Anemia 10/22/2015  . Restless legs 10/22/2015  . Hematuria 04/09/2015   Pt presents with f/u for diabetes. Pt states sugars are up and down at different times. Pt states sugars get low around lunch time about once a week.    Review of Systems     Objective:   Physical Exam  Constitutional: She is oriented to person, place, and time.  Cardiovascular: Normal rate, regular rhythm and normal heart sounds.   Pulmonary/Chest: Effort normal and breath sounds normal.  Neurological: She is alert and oriented to person, place, and time.   BP 124/88   Pulse 70   Temp 97.7 F (36.5 C)   Wt 171 lb (77.6 kg)     Medication List       Accurate as of 08/04/16  9:57 AM. Always use your most recent med list.          amLODipine 5 MG tablet Commonly known as:  NORVASC Take 1 tablet (5 mg total) by mouth 2 (two) times daily.   gabapentin 300 MG capsule Commonly known as:  NEURONTIN Take 1 capsule (300 mg total) by mouth at bedtime.   hydrochlorothiazide 25 MG tablet Commonly known as:  HYDRODIURIL Take 1 tablet (25 mg total) by mouth daily.   insulin aspart 100 UNIT/ML injection Commonly known as:  novoLOG Inject 5 Units into the skin 3 (three) times daily before meals.   insulin glargine 100 UNIT/ML injection Commonly known as:  LANTUS Inject 20 Units into the skin at bedtime.   lisinopril 20 MG tablet Commonly known as:  PRINIVIL,ZESTRIL Take 1 tablet (20 mg total) by mouth daily.                Assessment & Plan:  Sugars elevated. Recommends endocrinology clinic. Kidneys and cholesterol look good. Needs to check sugars 4 x's a day. Sugars under 100 at breakfast omit the morning dosage of novolog. Follow up 3 months. Sign up for endocrinology  clinic. Labs a1c/met c/ cbc/ Ua/ microalbumin prior to follow up.

## 2016-08-31 ENCOUNTER — Ambulatory Visit: Payer: Self-pay

## 2016-10-05 ENCOUNTER — Encounter: Payer: Self-pay | Admitting: Endocrinology

## 2016-10-05 ENCOUNTER — Ambulatory Visit: Payer: Self-pay | Admitting: Endocrinology

## 2016-10-05 VITALS — BP 167/99 | HR 82 | Temp 98.4°F | Wt 172.0 lb

## 2016-10-05 DIAGNOSIS — E1065 Type 1 diabetes mellitus with hyperglycemia: Secondary | ICD-10-CM

## 2016-10-05 LAB — POCT GLYCOSYLATED HEMOGLOBIN (HGB A1C): HEMOGLOBIN A1C: 10.2

## 2016-10-05 LAB — GLUCOSE, POCT (MANUAL RESULT ENTRY): POC Glucose: 301 mg/dl — AB (ref 70–99)

## 2016-10-05 NOTE — Progress Notes (Signed)
Assessment:   1. Type 1 diabetes  (HCC)  Type 1 DM of 32 years' duration, with poor control and considerable glycemic variability. Unable to make changes to regimen without data.  Lesions of feet, ? Plantar warts. Agree with referral to podiatry.   Plan:    Patient did not have log of blood sugar - instructed to record blood sugar and return to clinic in one month to see if we can adjust insulin dosing.   Given #150 strips so she can test 4-5 times daily until next month.   Subjective:    Courtney Stafford is a 45 y.o. female presenting with diabetes type 1 diagnosed at 45 years old. Referred by Dr Annell Greeninghapmann to control A1c.   Patient seen with Selinda FlavinMatthew Mule, medical student, who also functioned as scribe in part.  Takes Lantus 20 units at night and Novolog 5 units before each meal.  Glucose Average 110 High 353 Low 50 gets light headed (not often couple times per month) Doesn't keep glucose log. Often runs out of strips.  Eats 3 meals a day.   Lisinopril Gabpentin Lantus 20 units pm Novaolg 5 units postprandial  HCTZ Amlodipine   Drugs checked by pharmacist and updated as appropriate.   [facility]; no significant issues as far as she knows. No CAD.  Past Medical History:  Diagnosis Date  . Diabetes mellitus without complication (HCC)   . Hypertension      Objective:   Vitals:  Weight 172 BP 167/99 T98.4 HR 82 Glucose 301    Physical Exam:   Bilateral Neuropathy on feet + for nodile possible plantar wort - PCP has referred to podiatry.  Normal S1 S2 no murmurs rubs gallops  Lungs clear to ascultation bilaterally no increased work of breathing or airway obstruction.    Wt Readings from Last 3 Encounters:  08/04/16 171 lb (77.6 kg)  07/14/16 175 lb (79.4 kg)  05/19/16 171 lb (77.6 kg)     Lab Review No components found for: A1C Glucose (mg/dL)  Date Value  16/10/960409/13/2017 236 (H)  04/28/2016 294 (H)  01/21/2016 358 (H)   02/12/2014 776 (HH)  11/14/2012 41 (L)  11/13/2012 106 (H)   CO2 (mmol/L)  Date Value  07/28/2016 26  04/28/2016 25  01/21/2016 21   Co2 (mmol/L)  Date Value  02/12/2014 25  11/14/2012 21  11/13/2012 20 (L)   BUN (mg/dL)  Date Value  54/09/811909/13/2017 13  04/28/2016 11  01/21/2016 16  02/12/2014 17  11/14/2012 6 (L)  11/13/2012 10   Creatinine (mg/dL)  Date Value  14/78/295603/31/2015 1.30  11/14/2012 0.56 (L)  11/13/2012 0.65   Creatinine, Ser (mg/dL)  Date Value  21/30/865709/13/2017 0.72  04/28/2016 0.77  01/21/2016 0.86   No components found for: LDL,  LDLCALC,  LDLDIRECT Lab Results  Component Value Date   NA 136 07/28/2016   K 4.8 07/28/2016   CL 96 07/28/2016   CO2 26 07/28/2016   BUN 13 07/28/2016   CREATININE 0.72 07/28/2016   GFRAA 115 07/28/2016   GFRNONAA 100 07/28/2016   GLU 342 10/15/2015   CALCIUM 9.1 07/28/2016   ALBUMIN 4.0 07/28/2016   PHOS 3.6 11/12/2012     DIABETES MELLITUS RESULTS: Lab Results  Component Value Date   HGBA1C 9.8 (H) 07/28/2016   HGBA1C 9.6 (H) 04/28/2016   HGBA1C 10.5 (H) 01/21/2016   Lab Results  Component Value Date   LDLCALC 79 01/21/2016   CREATININE 0.72  07/28/2016   Lab Results  Component Value Date   CHOL 185 07/28/2016   Lab Results  Component Value Date   LDLCALC 79 01/21/2016   No components found for: CHOLLLDLDIRECT No components found for: MICROALB/CR Lab Results  Component Value Date   GFRAA 115 07/28/2016   GFRNONAA 100 07/28/2016

## 2016-10-27 ENCOUNTER — Other Ambulatory Visit: Payer: Self-pay | Admitting: Internal Medicine

## 2016-10-27 ENCOUNTER — Other Ambulatory Visit: Payer: Self-pay

## 2016-10-27 DIAGNOSIS — E118 Type 2 diabetes mellitus with unspecified complications: Secondary | ICD-10-CM

## 2016-10-28 LAB — COMPREHENSIVE METABOLIC PANEL
ALT: 12 IU/L (ref 0–32)
AST: 15 IU/L (ref 0–40)
Albumin/Globulin Ratio: 1.5 (ref 1.2–2.2)
Albumin: 4.7 g/dL (ref 3.5–5.5)
Alkaline Phosphatase: 108 IU/L (ref 39–117)
BUN/Creatinine Ratio: 18 (ref 9–23)
BUN: 14 mg/dL (ref 6–24)
Bilirubin Total: 0.2 mg/dL (ref 0.0–1.2)
CALCIUM: 9.9 mg/dL (ref 8.7–10.2)
CO2: 31 mmol/L — AB (ref 18–29)
Chloride: 95 mmol/L — ABNORMAL LOW (ref 96–106)
Creatinine, Ser: 0.78 mg/dL (ref 0.57–1.00)
GFR calc Af Amer: 105 mL/min/{1.73_m2} (ref >59)
GFR, EST NON AFRICAN AMERICAN: 91 mL/min/{1.73_m2} (ref >59)
GLOBULIN, TOTAL: 3.2 g/dL (ref 1.5–4.5)
GLUCOSE: 87 mg/dL (ref 65–99)
Potassium: 4.4 mmol/L (ref 3.5–5.2)
Sodium: 142 mmol/L (ref 134–144)
Total Protein: 7.9 g/dL (ref 6.0–8.5)

## 2016-10-28 LAB — CBC WITH DIFFERENTIAL
BASOS ABS: 0.1 10*3/uL (ref 0.0–0.2)
Basos: 1 %
EOS (ABSOLUTE): 0.3 10*3/uL (ref 0.0–0.4)
EOS: 4 %
HEMATOCRIT: 47 % — AB (ref 34.0–46.6)
Hemoglobin: 15.9 g/dL (ref 11.1–15.9)
IMMATURE GRANULOCYTES: 0 %
Immature Grans (Abs): 0 10*3/uL (ref 0.0–0.1)
Lymphocytes Absolute: 3 10*3/uL (ref 0.7–3.1)
Lymphs: 33 %
MCH: 28.9 pg (ref 26.6–33.0)
MCHC: 33.8 g/dL (ref 31.5–35.7)
MCV: 86 fL (ref 79–97)
MONOCYTES: 11 %
MONOS ABS: 1 10*3/uL — AB (ref 0.1–0.9)
Neutrophils Absolute: 4.6 10*3/uL (ref 1.4–7.0)
Neutrophils: 51 %
RBC: 5.5 x10E6/uL — AB (ref 3.77–5.28)
RDW: 14 % (ref 12.3–15.4)
WBC: 9 10*3/uL (ref 3.4–10.8)

## 2016-10-28 LAB — HEMOGLOBIN A1C
ESTIMATED AVERAGE GLUCOSE: 237 mg/dL
HEMOGLOBIN A1C: 9.9 % — AB (ref 4.8–5.6)

## 2016-10-28 LAB — URINALYSIS
BILIRUBIN UA: NEGATIVE
Ketones, UA: NEGATIVE
Leukocytes, UA: NEGATIVE
Nitrite, UA: NEGATIVE
PH UA: 5 (ref 5.0–7.5)
Protein, UA: NEGATIVE
RBC UA: NEGATIVE
Specific Gravity, UA: 1.019 (ref 1.005–1.030)
UUROB: 0.2 mg/dL (ref 0.2–1.0)

## 2016-10-28 LAB — MICROALBUMIN, URINE: MICROALBUM., U, RANDOM: 15.5 ug/mL

## 2016-11-02 ENCOUNTER — Ambulatory Visit: Payer: Self-pay | Admitting: Endocrinology

## 2016-11-02 VITALS — BP 170/99 | HR 75 | Ht 60.0 in | Wt 175.0 lb

## 2016-11-02 DIAGNOSIS — E109 Type 1 diabetes mellitus without complications: Secondary | ICD-10-CM

## 2016-11-02 DIAGNOSIS — I10 Essential (primary) hypertension: Secondary | ICD-10-CM

## 2016-11-02 LAB — GLUCOSE, POCT (MANUAL RESULT ENTRY): POC Glucose: 320 mg/dl — AB (ref 70–99)

## 2016-11-02 MED ORDER — GABAPENTIN 300 MG PO CAPS: 300.0000 mg | ORAL_CAPSULE | Freq: Four times a day (QID) | ORAL | 4 refills | Status: DC

## 2016-11-02 MED ORDER — INSULIN ASPART 100 UNIT/ML ~~LOC~~ SOLN: 6.0000 [IU] | Freq: Three times a day (TID) | SUBCUTANEOUS | 4 refills | Status: DC

## 2016-11-02 MED ORDER — AMLODIPINE BESYLATE 5 MG PO TABS: 10.0000 mg | ORAL_TABLET | Freq: Every day | ORAL | 4 refills | Status: DC

## 2016-11-02 MED ORDER — BLOOD GLUCOSE MONITORING SUPPL SUPPLIES MISC: 1.0000 | Freq: Every day | 4 refills | Status: DC

## 2016-11-02 MED ORDER — HYDROCHLOROTHIAZIDE 25 MG PO TABS: 25.0000 mg | ORAL_TABLET | Freq: Every day | ORAL | 4 refills | Status: DC

## 2016-11-02 MED ORDER — INSULIN GLARGINE 100 UNIT/ML ~~LOC~~ SOLN: 20.0000 [IU] | Freq: Every day | SUBCUTANEOUS | 4 refills | Status: DC

## 2016-11-02 MED ORDER — LISINOPRIL 40 MG PO TABS: 20.0000 mg | ORAL_TABLET | Freq: Every day | ORAL | 4 refills | Status: DC

## 2016-11-02 NOTE — Progress Notes (Signed)
[facility] Progress Note  11/02/2016  Name: Courtney Stafford  MRN: [id number]  Date of Birth: 1971/03/29  Chief Complaint:  Chief Complaint  Patient presents with  . Diabetes Mellitus    HPI: Type 1 diabetes, longstanding.  Glucose log with3-6 readings a day.  78-360 over the last week. Lantus 20 units at 9PM Novolog with meals 5 at each meal.  Takes additional for high glucose.  ~1 at 150, ~3 at 200, ~4 at 250, over 300 6-7.   Glucose rises through the day     Past Medical History:  Past Medical History:  Diagnosis Date  . Diabetes mellitus without complication (HCC)   . Hypertension     Past Surgical History:  Past Surgical History:  Procedure Laterality Date  . CARPAL TUNNEL RELEASE  6 years ago   both hands    Past Family History: No family history on file.  Diabetes Management:  Lab Results  Component Value Date   HGBA1C 9.9 (H) 10/27/2016    Meter here todayNo - but has logs  Hypoglycemia in the last 3 monthsNo - occasional in 60's  Highest blood sugar seen in last 1-2 months? 396  Reported blood sugar average:  Trouble with managing blood sugar :  New Complaints: None  Personal Diabetes Goal: Get sugars and blood pressure donw    Clinical Assessment & Plan    Patient Instructions  Lantus 20 units at bedtime Increase Novolog to 6 units three times a day.   For high glucose add insulin as follows: 150 +1,  200 +2, 250 +3,  300 +4 350 +5 400 +6 450 +7 500 +8  You may increase gabapentin to 2 tablets in AM and 2 tablets in PM if needed to control pain. Go up slowly.    Increase Lisinopril for blood pressure

## 2016-11-02 NOTE — Patient Instructions (Signed)
Lantus 20 units at bedtime Increase Novolog to 6 units three times a day.   For high glucose add insulin as follows: 150 +1,  200 +2, 250 +3,  300 +4 350 +5 400 +6 450 +7 500 +8  You may increase gabapentin to 2 tablets in AM and 2 tablets in PM if needed to control pain. Go up slowly.    Increase Lisinopril for blood pressure

## 2016-11-02 NOTE — Addendum Note (Signed)
Addended by: Milana HuntsmanSIDDIQUE, Deaglan Lile F on: 11/02/2016 08:34 PM   Modules accepted: Orders

## 2016-11-03 ENCOUNTER — Ambulatory Visit: Payer: Self-pay | Admitting: Internal Medicine

## 2016-11-30 ENCOUNTER — Ambulatory Visit: Payer: Self-pay

## 2016-12-07 ENCOUNTER — Other Ambulatory Visit: Payer: Self-pay

## 2016-12-07 NOTE — Telephone Encounter (Signed)
Received PAP application from [facility] for Novolog placed for provider to sign. 

## 2016-12-07 NOTE — Telephone Encounter (Signed)
Received PAP application from [facility] for Lantus placed for provider to sign. 

## 2016-12-09 NOTE — Telephone Encounter (Signed)
Placed signed application/script in [facility] folder for pickup. 

## 2016-12-13 ENCOUNTER — Telehealth: Payer: Self-pay | Admitting: Pharmacist

## 2016-12-13 NOTE — Telephone Encounter (Signed)
Novo Nordisk PAP refill submitted to manufacturer today. Novolog insulin, Inject 6 units into the skin 3 times daily with meals, plus 1 unit per 50 that the sugar is over 100, starting with 1 units at 150. Max daily dose 60 units.

## 2016-12-14 ENCOUNTER — Ambulatory Visit: Payer: Self-pay | Admitting: Urology

## 2016-12-14 VITALS — BP 174/101 | HR 81 | Wt 181.6 lb

## 2016-12-14 DIAGNOSIS — I152 Hypertension secondary to endocrine disorders: Secondary | ICD-10-CM

## 2016-12-14 DIAGNOSIS — E109 Type 1 diabetes mellitus without complications: Secondary | ICD-10-CM

## 2016-12-14 LAB — GLUCOSE, POCT (MANUAL RESULT ENTRY): POC Glucose: 134 mg/dl — AB (ref 70–99)

## 2016-12-14 NOTE — Progress Notes (Signed)
  Patient: Courtney Stafford Female    DOB: 10/09/1971   46 y.o.   MRN: [id number] Visit Date: 12/14/2016  Today's Provider: [facility]   No chief complaint on file.  Subjective:    HPI Patient is a 46 year old Caucasian female with DM who presents today for a follow up.  She states that she has been without glucose test strips since January 2nd.  She did pick up 50 test strips at today's visit.  BS readings from 70-400.   She is on 20 units of Lantus qhs and novolog 6 units tid with a sliding scale.  She is unable to use the sliding scale completely due to not having enough test strips.      Allergies  Allergen Reactions  . Phenergan [Promethazine] Hives   Previous Medications   AMLODIPINE (NORVASC) 5 MG TABLET    Take 2 tablets (10 mg total) by mouth daily.   BLOOD GLUCOSE MONITORING SUPPL SUPPLIES MISC    1 each by Does not apply route 5 (five) times daily.   GABAPENTIN (NEURONTIN) 300 MG CAPSULE    Take 1 capsule (300 mg total) by mouth 4 (four) times daily.   HYDROCHLOROTHIAZIDE (HYDRODIURIL) 25 MG TABLET    Take 1 tablet (25 mg total) by mouth daily.   INSULIN ASPART (NOVOLOG) 100 UNIT/ML INJECTION    Inject 6 Units into the skin 3 (three) times daily with meals. Plus 1 unit per 50 that the sugar is over 100 starting with 1 unit at 150.   INSULIN GLARGINE (LANTUS) 100 UNIT/ML INJECTION    Inject 0.2 mLs (20 Units total) into the skin at bedtime.   LISINOPRIL (PRINIVIL,ZESTRIL) 40 MG TABLET    Take 0.5 tablets (20 mg total) by mouth daily.    Review of Systems  Social History  Substance Use Topics  . Smoking status: Current Some Day Smoker    Packs/day: 0.10  . Smokeless tobacco: Former NeurosurgeonUser  . Alcohol use No   Objective:   BP (!) 174/101   Pulse 81   Wt 181 lb 9.6 oz (82.4 kg)   BMI 35.47 kg/m   Physical Exam Constitutional: Well nourished. Alert and oriented, No acute distress. HEENT: [place] AT, moist mucus membranes. Trachea midline, no  masses. Cardiovascular: No clubbing, cyanosis, or edema. Respiratory: Normal respiratory effort, no increased work of breathing. Skin: No rashes, bruises or suspicious lesions. Lymph: No cervical or inguinal adenopathy. Neurologic: Grossly intact, no focal deficits, moving all 4 extremities. Psychiatric: Normal mood and affect. Feet:  No open sores, several corns.       Assessment & Plan:    1. Uncontrolled HTN  - patient states she is taking her BP as prescribed  - increase lisinopril to 40 mg daily  - recheck BP in 2 weeks  2. Uncontrolled DM  - increase Novolog to 7 units tid  + sliding scale   - check BS log in 2 weeks           [facility]   [facility]

## 2016-12-29 ENCOUNTER — Telehealth: Payer: Self-pay | Admitting: Pharmacy Technician

## 2016-12-29 NOTE — Telephone Encounter (Signed)
Received updated proof of income.  Patient eligible to receive medication assistance at [facility] until 01/13/18 as long as there are no changes to her income, assets or insurance.  Sherilyn DacostaBetty J. Ventura Hollenbeck Care Manager [facility]

## 2016-12-30 ENCOUNTER — Telehealth: Payer: Self-pay | Admitting: Pharmacist

## 2016-12-30 NOTE — Telephone Encounter (Signed)
Duplicate encounter

## 2016-12-30 NOTE — Telephone Encounter (Signed)
Faxed application to [employer] for Lantus inject 20 units once daily at bedtime.

## 2017-01-20 ENCOUNTER — Telehealth: Payer: Self-pay

## 2017-01-20 NOTE — Telephone Encounter (Signed)
Received PAP application from [facility] for Novolog placed for provider to sign. 

## 2017-02-01 ENCOUNTER — Ambulatory Visit: Payer: Self-pay | Admitting: Physician Assistant

## 2017-02-01 VITALS — BP 158/99 | Wt 177.1 lb

## 2017-02-01 DIAGNOSIS — E109 Type 1 diabetes mellitus without complications: Secondary | ICD-10-CM

## 2017-02-01 DIAGNOSIS — I10 Essential (primary) hypertension: Secondary | ICD-10-CM

## 2017-02-01 NOTE — Progress Notes (Signed)
Assessment:   Her HbA1c is 10.6% today, up from 9.9% in December 2017. The range of blood glucose per her meter is 65-577. She ran out of strips, it would be helpful to see more blood sugar readings. She reports being low mid morning following breakfast.  Blood pressure is elevated.   Plan:    1.  Test blood sugar 3 times a day.  2. Rx changes: Keep taking the 20 units of Lantus. Test blood sugar before you eat. If blood sugar is above 250, take 7 Units of the Novolog. If blood sugar is less than 250, take 5 units of the Novolog. Try to eat less before bed.  3. Avoid canned vegetables, replace with frozen vegetables. Walk more.  Consider medication changes next month.  3. Follow up: I recommend patient follow-up with me at 1 month.      Subjective:    Courtney Stafford is a [age] y.o. female who is seen in follow up forType 1 from . She has had diabetes since she was 13.    Eye  exam current (within one year): Scheduled for this week   Current regimen: She takes 25 units of lantus before bed a 9 PM.  She takes 5 units of novolog with meals. She reports eating three meals a day. She   Courtney Stafford is performing SMBG on average 2-3 times per day, when she has strips. She recently ran out of strips and did not bring her meter. She brought her meter here today; range is 65-577.   She complains of low blood sugar 2-3 times per week, possibly related to eating less. She reports this happens around 10 or 11 in the morning, and she eats breakfast at 9. Nocturnal hypoglycemia is rare.   She eats bacon and cheese sandwich for lunch, ham and cheese sandwich for lunch, and variable dinner (rice, beans, chicken). She eats a sandwich before bed every night to avoid low blood sugar.   Denies severe hypoglycemia or admission to hospital for DKA.  Current exercise: walks a few days a week  The patient's history was reviewed and updated as appropriate.  Allergies  Allergen Reactions  .  Phenergan [Promethazine] Hives     Current Outpatient Prescriptions:  .  amLODipine (NORVASC) 5 MG tablet, Take 2 tablets (10 mg total) by mouth daily., Disp: 90 tablet, Rfl: 4 .  Blood Glucose Monitoring Suppl Supplies MISC, 1 each by Does not apply route 5 (five) times daily., Disp: 500 each, Rfl: 4 .  gabapentin (NEURONTIN) 300 MG capsule, Take 1 capsule (300 mg total) by mouth 4 (four) times daily., Disp: 360 capsule, Rfl: 4 .  hydrochlorothiazide (HYDRODIURIL) 25 MG tablet, Take 1 tablet (25 mg total) by mouth daily., Disp: 90 tablet, Rfl: 4 .  insulin aspart (NOVOLOG) 100 UNIT/ML injection, Inject 6 Units into the skin 3 (three) times daily with meals. Plus 1 unit per 50 that the sugar is over 100 starting with 1 unit at 150., Disp: 30 mL, Rfl: 4 .  insulin glargine (LANTUS) 100 UNIT/ML injection, Inject 0.2 mLs (20 Units total) into the skin at bedtime., Disp: 30 mL, Rfl: 4 .  lisinopril (PRINIVIL,ZESTRIL) 40 MG tablet, Take 0.5 tablets (20 mg total) by mouth daily., Disp: 90 tablet, Rfl: 4  Social History   Social History  . Marital status: Single    Spouse name: N/A  . Number of children: N/A  . Years of education: N/A   Social History Main Topics  .  Smoking status: Current Some Day Smoker    Packs/day: 0.10  . Smokeless tobacco: Former Neurosurgeon  . Alcohol use No  . Drug use: No  . Sexual activity: Not on file   Other Topics Concern  . Not on file   Social History Narrative  . No narrative on file    No family history on file.  Review of Systems A 12 point review of systems was negative except for pertinent items noted in the HPI.   Objective:     Wt Readings from Last 3 Encounters:  12/14/16 181 lb 9.6 oz (82.4 kg)  11/02/16 175 lb (79.4 kg)  10/05/16 172 lb (78 kg)   There were no vitals taken for this visit.                                            Lab Review No components found for: A1C Glucose (mg/dL)  Date Value  56/21/3086 87   07/28/2016 236 (H)  04/28/2016 294 (H)  02/12/2014 776 (HH)  11/14/2012 41 (L)  11/13/2012 106 (H)   CO2 (mmol/L)  Date Value  10/27/2016 31 (H)  07/28/2016 26  04/28/2016 25   Co2 (mmol/L)  Date Value  02/12/2014 25  11/14/2012 21  11/13/2012 20 (L)   BUN (mg/dL)  Date Value  57/84/6962 14  07/28/2016 13  04/28/2016 11  02/12/2014 17  11/14/2012 6 (L)  11/13/2012 10   Creatinine (mg/dL)  Date Value  95/28/4132 1.30  11/14/2012 0.56 (L)  11/13/2012 0.65   Creatinine, Ser (mg/dL)  Date Value  44/11/270 0.78  07/28/2016 0.72  04/28/2016 0.77   No components found for: LDL,  LDLCALC,  LDLDIRECT Lab Results  Component Value Date   NA 142 10/27/2016   K 4.4 10/27/2016   CL 95 (L) 10/27/2016   CO2 31 (H) 10/27/2016   BUN 14 10/27/2016   CREATININE 0.78 10/27/2016   GFRAA 105 10/27/2016   GFRNONAA 91 10/27/2016   GLU 342 10/15/2015   CALCIUM 9.9 10/27/2016   ALBUMIN 4.7 10/27/2016   PHOS 3.6 11/12/2012     DIABETES MELLITUS RESULTS: Lab Results  Component Value Date   HGBA1C 9.9 (H) 10/27/2016   HGBA1C 10.2 10/05/2016   HGBA1C 9.8 (H) 07/28/2016   Lab Results  Component Value Date   LDLCALC 79 01/21/2016   CREATININE 0.78 10/27/2016   Lab Results  Component Value Date   CHOL 185 07/28/2016   Lab Results  Component Value Date   LDLCALC 79 01/21/2016   No components found for: CHOLLLDLDIRECT No components found for: MICROALB/CR Lab Results  Component Value Date   GFRAA 105 10/27/2016   GFRNONAA 91 10/27/2016

## 2017-02-01 NOTE — Patient Instructions (Addendum)
Keep taking 20 units of Lantus at bedtime.   Test your blood sugar before meals. If your blood sugar is higher than 250, take 7 units of Novolog when you eat. If your blood sugar is lower than 250, take 5 units of Novolog with meals.   Try to avoid salt in your diet. Use frozen or fresh vegetables, rinse canned vegetables with water. Try to walk more for exercise.   Keep a logbook and come back in 1 month.

## 2017-02-03 ENCOUNTER — Ambulatory Visit: Payer: Self-pay | Admitting: Ophthalmology

## 2017-02-03 LAB — GLUCOSE, POCT (MANUAL RESULT ENTRY): POC Glucose: 365 mg/dl — AB (ref 70–99)

## 2017-02-03 LAB — POCT GLYCOSYLATED HEMOGLOBIN (HGB A1C): HEMOGLOBIN A1C: 10.6

## 2017-02-03 NOTE — Addendum Note (Signed)
Addended by: Smiley HousemanARTER, Robby Bulkley D on: 02/03/2017 01:30 PM   Modules accepted: Orders

## 2017-02-03 NOTE — Telephone Encounter (Signed)
Placed signed application/script in [facility] folder for pickup. 

## 2017-02-17 ENCOUNTER — Ambulatory Visit: Payer: Self-pay | Admitting: Ophthalmology

## 2017-02-21 ENCOUNTER — Telehealth: Payer: Self-pay | Admitting: Pharmacist

## 2017-02-21 NOTE — Telephone Encounter (Signed)
02/21/17 Faxed Novo Nordisk renewal for [employer] vials Inject 6 units into the skin three times daily before meals, # 5 - Max daily dose 18 units.

## 2017-03-01 ENCOUNTER — Ambulatory Visit: Payer: Self-pay

## 2017-03-29 ENCOUNTER — Ambulatory Visit: Payer: Self-pay | Admitting: Internal Medicine

## 2017-03-29 DIAGNOSIS — E108 Type 1 diabetes mellitus with unspecified complications: Secondary | ICD-10-CM

## 2017-03-29 MED ORDER — INSULIN ASPART 100 UNIT/ML FLEXPEN: PEN_INJECTOR | SUBCUTANEOUS | 11 refills | Status: DC

## 2017-03-29 MED ORDER — INSULIN PEN NEEDLE 32G X 4 MM MISC: 12 refills | Status: DC

## 2017-03-29 MED ORDER — ASPIRIN 81 MG PO TABS: 81.0000 mg | ORAL_TABLET | Freq: Every day | ORAL | 4 refills | Status: DC

## 2017-03-29 MED ORDER — ATORVASTATIN CALCIUM 10 MG PO TABS: 10.0000 mg | ORAL_TABLET | Freq: Every day | ORAL | 3 refills | Status: DC

## 2017-03-29 NOTE — Progress Notes (Signed)
Assessment:   Type 1 diabetes with hyperglycemia Menopause with possible hot flashes   Plan:    1.  Rx changes: Continue Lantus at 20 units.   Encouraged Carb Counting and meal time insulin prediction instead of correction.   Ariea reports eating 2-3 snacks every day. Encouraged 1unit/10 carbs (i.e 50 carbs, 5 units). Encourage to give Novolog with all carbs intake.   Cannot have a fixed dose because every meal is different but upper limit is 20 units of insulin during mealtimes.   Check 5 times/day.  Change Novolog from vial to pen to help with compliance with all carbohydrates intake.   Start low dose statin with Lipitor 10 mg daily (previoulsly optimal LDL) and aspirin 81 mg daily  2.  Courtney Stafford attended the shared medical appointment on Mar 29 2017. Topics that were discussed tonight include diet, exercise, taking medications, blood glucose pattern recognition, meaning of laboratory results. Additional topics included different ways to exercise.   3. Follow up: I recommend patient follow-up with me at 1 months.    4. If we are able to prescribe antidepressants at [facility], consider prescribing Prozac for depression and hot-flashes.   Symptoms are atypical for CAD but If persistent chest pain or worsening shortness of breath, go the emergency room for evaluation for coronary disease.   Subjective:    Courtney Stafford is a 46 y.o. female who is seen in follow up forType 1 diabetes since 46 years of age complicated by  neuropathy, hypertension and dyslipidemia.  Cardiovascular risk factors: hypertension, dyslipidemia, physical inactivity and tobacco  Eye exam current (within one year): Yes -- Exam at [facility] in January/February  Current regimen:  Type 1 Diabetes  [name] is performing SMBG on average 3-4 times per day. Range over last  Month has been 80-350.  Patient blood sugars were ranging in the morning from 80-250. Expressed concern about going low. Stated  hypoglycemia a few weeks ago. Recorded a 68 on 3/25 and felt nauseated and light-headed.   Reports severe symptoms of hypoglycemia. These include fasting events. Denies polyuria, polydypsia and polyphagia. Denies chest pain, shortness of breath, edema, foot lesions or ulcers.  Denies severe hypoglycemia or admission to hospital for DKA.  Cardiovascular Patient reports heart racing at least once a week for the past several weeks. Episodes vary during the day. Also admits shortness of breath and trouble with flight of stairs. Reports possible peripheral claudication on physical exertion (whenever she is walking around legs hurt). Reports peripheral edema almost every day. Denies resting chest pain but acknowledges chest pain on exertion at time.  Current exercise: Limited  Denies use of inhaler.   Thyroid/Endocrine Denies tremor. Acknowledges some sweating at night.   No longer menstruating -- stopped approximately 3-4 months. Prior was once a month and stated regular. May acknowledge some hot flashes. Says that throughout the night she puts on the blanket and takes it right off.        The patient's history was reviewed and updated as appropriate.  Allergies  Allergen Reactions   Phenergan [Promethazine] Hives    Current Outpatient Prescriptions:    amLODipine (NORVASC) 5 MG tablet, Take 2 tablets (10 mg total) by mouth daily., Disp: 90 tablet, Rfl: 4   Blood Glucose Monitoring Suppl Supplies MISC, 1 each by Does not apply route 5 (five) times daily., Disp: 500 each, Rfl: 4   gabapentin (NEURONTIN) 300 MG capsule, Take 1 capsule (300 mg total) by mouth 4 (four)  times daily., Disp: 360 capsule, Rfl: 4   hydrochlorothiazide (HYDRODIURIL) 25 MG tablet, Take 1 tablet (25 mg total) by mouth daily., Disp: 90 tablet, Rfl: 4   insulin aspart (NOVOLOG) 100 UNIT/ML injection, Inject 6 Units into the skin 3 (three) times daily with meals. Plus 1 unit per 50 that the sugar is over 100  starting with 1 unit at 150., Disp: 30 mL, Rfl: 4   insulin glargine (LANTUS) 100 UNIT/ML injection, Inject 0.2 mLs (20 Units total) into the skin at bedtime., Disp: 30 mL, Rfl: 4   lisinopril (PRINIVIL,ZESTRIL) 40 MG tablet, Take 0.5 tablets (20 mg total) by mouth daily., Disp: 90 tablet, Rfl: 4  Social History   Social History   Marital status: Single    Spouse name: N/A   Number of children: N/A   Years of education: N/A   Social History Main Topics   Smoking status: Current Some Day Smoker    Packs/day: 0.10   Smokeless tobacco: Former NeurosurgeonUser   Alcohol use No   Drug use: No   Sexual activity: Not on file   Other Topics Concern   Not on file   Social History Narrative   No narrative on file    No family history on file.  Review of Systems A 12 point review of systems was negative except for pertinent items noted in the HPI.   Objective:     Wt Readings from Last 3 Encounters:  02/01/17 177 lb 1.6 oz (80.3 kg)  12/14/16 181 lb 9.6 oz (82.4 kg)  11/02/16 175 lb (79.4 kg)   There were no vitals taken for this visit.  General appearance:  alert and appears stated age, in no distress  Dentition Some chips and wear.   Eyes:  conjunctivae/corneas clear. EOM's intact. Sclera anicteric. Negative lid lag or proptosis     Neck: no adenopathy, supple, symmetrical, trachea midline  Thyroid:  Mobile, normal size, no palpable nodule  Lung: End expiratory wheezes   Heart:  regular rate and rhythm, S1, S2 normal, no murmur, click, rub or gallop  Abdomen:  bowel sounds normal; negative bruits  Extremities: extremities normal, atraumatic, no cyanosis or edema     Pulses: DP & PT 2+ and symmetric.  Neuro: normal without focal findings, mental status, speech normal, alert and oriented x3, reflexes normal and symmetric and gait normal.   Feet: negative lesions or ulcers, 10 gram monofilament intact bilateral plantar surface     Lab Review No components found for:  A1C Glucose (mg/dL)  Date Value  96/04/540912/13/2017 87  07/28/2016 236 (H)  04/28/2016 294 (H)  02/12/2014 776 (HH)  11/14/2012 41 (L)  11/13/2012 106 (H)   CO2 (mmol/L)  Date Value  10/27/2016 31 (H)  07/28/2016 26  04/28/2016 25   Co2 (mmol/L)  Date Value  02/12/2014 25  11/14/2012 21  11/13/2012 20 (L)   BUN (mg/dL)  Date Value  81/19/147812/13/2017 14  07/28/2016 13  04/28/2016 11  02/12/2014 17  11/14/2012 6 (L)  11/13/2012 10   Creatinine (mg/dL)  Date Value  29/56/213003/31/2015 1.30  11/14/2012 0.56 (L)  11/13/2012 0.65   Creatinine, Ser (mg/dL)  Date Value  86/57/846912/13/2017 0.78  07/28/2016 0.72  04/28/2016 0.77   No components found for: LDL,  LDLCALC,  LDLDIRECT Lab Results  Component Value Date   NA 142 10/27/2016   K 4.4 10/27/2016   CL 95 (L) 10/27/2016   CO2 31 (H) 10/27/2016   BUN 14 10/27/2016  CREATININE 0.78 10/27/2016   GFRAA 105 10/27/2016   GFRNONAA 91 10/27/2016   GLU 342 10/15/2015   CALCIUM 9.9 10/27/2016   ALBUMIN 4.7 10/27/2016   PHOS 3.6 11/12/2012     DIABETES MELLITUS RESULTS: Lab Results  Component Value Date   HGBA1C 10.6 02/03/2017   HGBA1C 9.9 (H) 10/27/2016   HGBA1C 10.2 10/05/2016   Lab Results  Component Value Date   LDLCALC 79 01/21/2016   CREATININE 0.78 10/27/2016   Lab Results  Component Value Date   CHOL 185 07/28/2016   Lab Results  Component Value Date   LDLCALC 79 01/21/2016   No components found for: CHOLLLDLDIRECT No components found for: MICROALB/CR Lab Results  Component Value Date   GFRAA 105 10/27/2016   GFRNONAA 91 10/27/2016

## 2017-03-31 ENCOUNTER — Telehealth: Payer: Self-pay

## 2017-03-31 NOTE — Telephone Encounter (Signed)
[facility] & Lantus placed for provider to sign.

## 2017-03-31 NOTE — Telephone Encounter (Signed)
Placed signed application/script in [facility] folder for pickup. 

## 2017-04-04 ENCOUNTER — Telehealth: Payer: Self-pay | Admitting: Pharmacist

## 2017-04-04 NOTE — Telephone Encounter (Signed)
04/01/17 Novolog Vials-Change in Directions-Faxed a refill request to [employer] with directions: Inject 6 units under the skin 3 times a day with meals, plus 1 unit per 50 that BS>100 starting with 1 unit at 150. # 7 vials, Max daily dose 60 units.

## 2017-04-04 NOTE — Telephone Encounter (Signed)
04/04/17 Lantus Vials refill-Inject 20 units under the skin daily at bedtime-faxed refill request to [employer].

## 2017-04-08 ENCOUNTER — Telehealth: Payer: Self-pay

## 2017-04-08 NOTE — Telephone Encounter (Signed)
Received [facility] for Novolog Flexpen placed for provider to sign.

## 2017-05-03 ENCOUNTER — Ambulatory Visit: Payer: Self-pay | Admitting: Endocrinology

## 2017-05-03 DIAGNOSIS — E108 Type 1 diabetes mellitus with unspecified complications: Secondary | ICD-10-CM

## 2017-05-03 NOTE — Progress Notes (Signed)
Patient's primary care provider: None at time but [facility]   Courtney Stafford is 46 year old female returning for follow-up of T1DM which was diagnosed at age 46 complicated by overweight, smoking (1-pack per week since 20s) and cardiovascular risk factors.  A1c was 10.6 on March 22 and was 11.2 today (June 19). Brought diabetes log with them today with the lowest number at 65 and patient reports feeling "weak" during that time. Drinks OJ to correct hypoglycemia. The highest number is high 400s, which is happening around 1-time per week.   Complains of dsyphagia both solids and liquids almost every day for the past week. At times the dysphagia results in emesis. Short of breath after 10-minutes of walking.    Last visit thought [name] was going through menopause that could be causing the hot flashes and the feelings of increased heart rate. This is happening at least 2-3-times per week.   Exercise: Does not have transportation so walks frequently   Other problems include:  Patient Active Problem List   Diagnosis Date Noted   HTN (hypertension) 10/22/2015   Type 1 diabetes (HCC) 10/22/2015   Anemia 10/22/2015   Restless legs 10/22/2015   Hematuria 04/09/2015     Her current medications include: Current Outpatient Prescriptions  Medication Sig Dispense Refill   amLODipine (NORVASC) 5 MG tablet Take 2 tablets (10 mg total) by mouth daily. 90 tablet 4   aspirin 81 MG tablet Take 1 tablet (81 mg total) by mouth daily. 90 tablet 4   atorvastatin (LIPITOR) 10 MG tablet Take 1 tablet (10 mg total) by mouth daily. (Patient taking differently: Take 10 mg by mouth daily at 6 PM. ) 90 tablet 3   Blood Glucose Monitoring Suppl Supplies MISC 1 each by Does not apply route 5 (five) times daily. 500 each 4   gabapentin (NEURONTIN) 300 MG capsule Take 1 capsule (300 mg total) by mouth 4 (four) times daily. 360 capsule 4   hydrochlorothiazide (HYDRODIURIL) 25 MG tablet Take 1 tablet  (25 mg total) by mouth daily. 90 tablet 4   insulin aspart (NOVOLOG) 100 UNIT/ML injection Inject 6 Units into the skin 3 (three) times daily with meals. Plus 1 unit per 50 that the sugar is over 100 starting with 1 unit at 150. 30 mL 4   insulin glargine (LANTUS) 100 UNIT/ML injection Inject 0.2 mLs (20 Units total) into the skin at bedtime. (Patient taking differently: Inject 25 Units into the skin at bedtime. ) 30 mL 4   Insulin Pen Needle (BD PEN NEEDLE NANO U/F) 32G X 4 MM MISC 5 times daily 200 each 12   lisinopril (PRINIVIL,ZESTRIL) 40 MG tablet Take 0.5 tablets (20 mg total) by mouth daily. 90 tablet 4   insulin aspart (NOVOLOG FLEXPEN) 100 UNIT/ML FlexPen Up to 30 units in divided doses per MD instructions. (Patient not taking: Reported on 05/03/2017) 15 mL 11   No current facility-administered medications for this visit.      Currently taking 25 units of Lantus at night and using a sliding scale of novolog with a base of 6 units and then a 1 unit increase for every 50-points over 100.   Interim history: T1DM for over 30 years.   Exam: Weight: 176.2 BP: 134/93 HR: 95 Blood Glucose: 145 Constitutional: Alert, oriented, in NAD Eyes: EOMI, lid lag none, fundus normal with sharp discs, no retinopathy seen ENT: thyroid gland nomal in size, smooth texture, nodules: none Cardiovascular: Regular rhythm, no murmurs, gallops,  rubs. Peripheral pulses normal, 2+. Negative peripheral edema Respiratory: Lungs clear, some wheezes, no rales Skin: no rashes or lesions. Feet without lesions Neurologic: Gait normal, DTRs normal. Tremor: none. light touch sensation normal in feet in 5/5 spots bilaterally by monofilament Psychiatric: Oriented, normal mood and affect Heme/lymph/immunologic: no lymphadenopathy   Recent labs:  A1c: 11.2  Assessment and Plan:   T1DM with complications for over 30 years. Complaints of dysphagia and a "racing heart and feeling hot".   Fasting blood sugars  (morning) are good to low. We want to work on pre- and post-blood sugars. Shift to 23 units of lantus at night. Use base 8-units of Novolog and then increase by 1-unit for every 50 points over 100.   Refer to primary care for dysphagia if it continues for more than 2-weeks.   Eye exam earlier this year at [facility] (normal exam).   Follow-up in 3 months.   No Follow-up on file.

## 2017-05-03 NOTE — Progress Notes (Signed)
Patient seen with medical student; agree with his note. Her A1C is quite high. Blood glucose review shows that fasting glucose is at target or low, while blood sugars later in the day are typically very high. She says she is not missing insulin. Recommend reducing Lantus to 23 units q hs, and increasing base Novolog to 8 units. Check TFTs tonight. Needs primary care f/u for complaints of dysphagia and hot flashes.

## 2017-05-04 LAB — TSH: TSH: 0.254 u[IU]/mL — ABNORMAL LOW (ref 0.450–4.500)

## 2017-05-04 LAB — T4, FREE: Free T4: 1.5 ng/dL (ref 0.82–1.77)

## 2017-05-04 NOTE — Telephone Encounter (Signed)
Placed signed application/script in [facility] folder for pickup. 

## 2017-05-10 ENCOUNTER — Telehealth: Payer: Self-pay | Admitting: Pharmacist

## 2017-05-10 NOTE — Telephone Encounter (Signed)
05/10/17 Faxed refill request to [employer] for [employer]-Novolog Flexpen max daily dose 60 units- Adjust units based on carb intake up to 20 units per dose. & Novofine 32G.Forde RadonAJ

## 2017-08-02 ENCOUNTER — Ambulatory Visit: Payer: Self-pay | Admitting: Endocrinology

## 2017-08-02 VITALS — BP 139/99 | HR 87 | Temp 98.1°F | Wt 174.1 lb

## 2017-08-02 DIAGNOSIS — E119 Type 2 diabetes mellitus without complications: Secondary | ICD-10-CM

## 2017-08-02 LAB — GLUCOSE, POCT (MANUAL RESULT ENTRY): POC Glucose: 254 mg/dl — AB (ref 70–99)

## 2017-08-02 NOTE — Progress Notes (Signed)
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

## 2017-08-02 NOTE — Progress Notes (Cosign Needed)
Patient's primary care provider: No primary care provider on file.   Courtney Stafford is a 46yo female with PMH HTN and T1DM presenting for f/u care of her T1DM.  Pt complains of palpitations that occur approximately 1x daily and last for 5 minutes or less. Concerns for hyperthyroid based on cardiac symptoms, hypertension uncontrolled with amlodipine and HCTZ and lisinopril, and past mildly suppressed TSH with normal free T4.   Will recheck TSH and free T4, will check TSI and TPO. Plan to conduct labs at next visit in 2 months.  Diabetes Management: Pt did not have enough test strips over the last 2 months, unable to check sugars during the day. Received 150 strips at visit today, told to check sugars every morning and 3x daily, instructed to return with sugar log and meter. Will review medications based on sugar results at return visit. Will recheck A1C at return visit (A1C machine tonight unreliable).   Other problems include:  Patient Active Problem List   Diagnosis Date Noted  . HTN (hypertension) 10/22/2015  . Type 1 diabetes (HCC) 10/22/2015  . Anemia 10/22/2015  . Restless legs 10/22/2015  . Hematuria 04/09/2015     Her  current medications include: Current Outpatient Prescriptions  Medication Sig Dispense Refill  . amLODipine (NORVASC) 5 MG tablet Take 2 tablets (10 mg total) by mouth daily. 90 tablet 4  . aspirin 81 MG tablet Take 1 tablet (81 mg total) by mouth daily. 90 tablet 4  . atorvastatin (LIPITOR) 10 MG tablet Take 1 tablet (10 mg total) by mouth daily. (Patient taking differently: Take 10 mg by mouth daily at 6 PM. ) 90 tablet 3  . gabapentin (NEURONTIN) 300 MG capsule Take 1 capsule (300 mg total) by mouth 4 (four) times daily. 360 capsule 4  . hydrochlorothiazide (HYDRODIURIL) 25 MG tablet Take 1 tablet (25 mg total) by mouth daily. 90 tablet 4  . insulin aspart (NOVOLOG FLEXPEN) 100 UNIT/ML FlexPen Up to 30 units in divided doses per MD instructions. 15 mL 11  .  insulin glargine (LANTUS) 100 UNIT/ML injection Inject 0.2 mLs (20 Units total) into the skin at bedtime. (Patient taking differently: Inject 23 Units into the skin at bedtime. ) 30 mL 4  . Insulin Pen Needle (BD PEN NEEDLE NANO U/F) 32G X 4 MM MISC 5 times daily 200 each 12  . lisinopril (PRINIVIL,ZESTRIL) 40 MG tablet Take 0.5 tablets (20 mg total) by mouth daily. 90 tablet 4  . Blood Glucose Monitoring Suppl Supplies MISC 1 each by Does not apply route 5 (five) times daily. 500 each 4  . insulin aspart (NOVOLOG) 100 UNIT/ML injection Inject 6 Units into the skin 3 (three) times daily with meals. Plus 1 unit per 50 that the sugar is over 100 starting with 1 unit at 150. (Patient not taking: Reported on 08/02/2017) 30 mL 4   No current facility-administered medications for this visit.      Interim history:    Exam:  BP (!) 139/99   Pulse 87   Temp 98.1 F (36.7 C)   Wt 174 lb 1.6 oz (79 kg)   BMI 34.00 kg/m   Constitutional: , Alert, oriented, in NAD Cardiovascular: Regular rhythm, no murmurs, gallops, rubs. Peripheral pulses + Respiratory: Lungs clear, no wheezes or rales Skin: no rashes or lesions. Feet without lesions    Recent labs:  Results for orders placed or performed in visit on 08/02/17  POCT Glucose (CBG)  Result Value Ref Range  POC Glucose 254 (A) 70 - 99 mg/dl

## 2017-08-02 NOTE — Progress Notes (Signed)
Patient seen by Patric Dykes and the medical student. Visit shortened due to patient having to leave. Will need thyroid labs on RTC.

## 2017-08-12 ENCOUNTER — Telehealth: Payer: Self-pay | Admitting: Pharmacist

## 2017-08-12 NOTE — Telephone Encounter (Signed)
08/12/17 Faxed refill request to [employer] for Lantus Solostar pens Inject 20 units under the skin daily at bedtime.[name]

## 2017-08-25 ENCOUNTER — Telehealth: Payer: Self-pay

## 2017-08-25 NOTE — Telephone Encounter (Signed)
Received PAP application from [facility] for novolog flexpen placed for provider to sign.

## 2017-08-25 NOTE — Telephone Encounter (Signed)
Placed signed application/script in [facility] folder for pickup. 

## 2017-09-06 ENCOUNTER — Telehealth: Payer: Self-pay | Admitting: Pharmacist

## 2017-09-06 NOTE — Telephone Encounter (Signed)
09/06/17 Faxing refill request to [employer] for [employer] Flexpen Max daily dose 60 units Adjust units based on carb intake up to 20 units per dose (#5).Forde RadonAJ

## 2017-10-03 NOTE — Progress Notes (Signed)
Patient's primary care provider: No primary care provider on file.  Courtney Stafford is a 46 y.o. female last seen on 08/02/2017 with complaints of heart palpitates once a day that lasts approximately 5-minutes or less and uncontrolled hypertension despite amlodipine, HCTZ, and lisonpril. TSH, free T4, TSI, and TPO were ordered but not filled. Will get them tonight.   Courtney Stafford has had T1DM for ~33 years, diagnosed at 46 years of age with cardiovascular risk factors of smoking 1/2 ppd and inactivity. Patient A1c tonight was 9.3% previous one was from 3/22.    Plan 1) T1DM -- need to increase testing of blood sugars. Increase the novolog to 9 units for every meal but when eating a higher carbohydrate meal (starches, rice) increase to 12 units versus a lower carbohydrate meal 6 units. Feelings like hypoglycemic episodes are occurring before breakfast, so will decrease Lantus to 20 units.   2) Hypertension/Heart Palpitatons -- order TSH panel. Increase the lisinopril to 40mg  from 20mg .    Subjective  T1DM Checking blood sugar 1 day when have strips -- typically mid-ranges of 200 in the morning, in the afternoon after lunch 150-300 and before bed and after dinner 150-300. Self reported average blood sugar was 375 with the highest self-reported blood sugar in the past month of that she remembers. Acknowledges feeling like having episodes of hypoglycemia but has not checked. From the glucometer and scripts she brought in, there is one 74, 64, and 53 recorded and drank juice (or at something sweet) to bring blood sugar back up.   Using 8 units of Novolog 3 times a day corresponding with meals. Currently taking 23 units of Lantus QD at night. Denies missing doses. Has not checked blood sugars in some time because ran out of strips but on glucometer (transportation issue).   Patient admits neuorpathy that is diffuse and is "sometimes" worse at times than other primarily in both legs and R arm.  Acknowledges a "tingly" pain that is "aggravating" and all through arm up to shoulder. This is occurring 3-4/week with no aggravating or alleviating factors. Denies onset of pain on exertion. Patient admits increased polydipsia and polyuria but denies blurry vision. Acknowledges potential gastroparesis.   Diet usually consists of chicken for dinner but is eating sandwiches or noodles for lunch. Denies tea and only drinks diet soda. For exercises goes on walks 1-2/week for 10-15 minutes.   Heart Palpitations Heart racing and feeling like it is beating out of her chest and may skip, "it's weird feeling". Heart palpitations may at times be associated with arm pain. Occurring 1-2x day but only 2-3 days per week. Last menstrual cycle was over 1 year ago. Acknowledges "hot-flashes" and usually wets her face to relieve that feeling. Denies blurry vision. Acknowledges some syncope due to orthostatic changes but not often. Acknowledges dyspnea at both rest and on exertion -- can only walk 10-15 steps without feeling SOB. Has to stop every few minutes when walking into the store to catch her breath.   Acknowledges some dysphagia but denies feeling like food is getting caught.    Objective Other problems include:  Patient Active Problem List   Diagnosis Date Noted   HTN (hypertension) 10/22/2015   Type 1 diabetes (HCC) 10/22/2015   Anemia 10/22/2015   Restless legs 10/22/2015   Hematuria 04/09/2015     Her  current medications include: Current Outpatient Medications  Medication Sig Dispense Refill   amLODipine (NORVASC) 5 MG tablet Take 2 tablets (10 mg  total) by mouth daily. 90 tablet 4   aspirin 81 MG tablet Take 1 tablet (81 mg total) by mouth daily. 90 tablet 4   atorvastatin (LIPITOR) 10 MG tablet Take 1 tablet (10 mg total) by mouth daily. (Patient taking differently: Take 10 mg by mouth daily at 6 PM. ) 90 tablet 3   Blood Glucose Monitoring Suppl Supplies MISC 1 each by Does not  apply route 5 (five) times daily. 500 each 4   gabapentin (NEURONTIN) 300 MG capsule Take 1 capsule (300 mg total) by mouth 4 (four) times daily. 360 capsule 4   hydrochlorothiazide (HYDRODIURIL) 25 MG tablet Take 1 tablet (25 mg total) by mouth daily. 90 tablet 4   insulin aspart (NOVOLOG FLEXPEN) 100 UNIT/ML FlexPen Up to 30 units in divided doses per MD instructions. 15 mL 11   insulin aspart (NOVOLOG) 100 UNIT/ML injection Inject 6 Units into the skin 3 (three) times daily with meals. Plus 1 unit per 50 that the sugar is over 100 starting with 1 unit at 150. 30 mL 4   insulin glargine (LANTUS) 100 UNIT/ML injection Inject 0.2 mLs (20 Units total) into the skin at bedtime. (Patient taking differently: Inject 23 Units into the skin at bedtime. ) 30 mL 4   Insulin Pen Needle (BD PEN NEEDLE NANO U/F) 32G X 4 MM MISC 5 times daily 200 each 12   lisinopril (PRINIVIL,ZESTRIL) 40 MG tablet Take 0.5 tablets (20 mg total) by mouth daily. 90 tablet 4   No current facility-administered medications for this visit.      Exam:  Temp 97.9 F (36.6 C)    Wt 178 lb 3.2 oz (80.8 kg)    BMI 34.80 kg/m   Vitals:   10/04/17 1740  Temp: 97.9 F (36.6 C)  Blood pressure was134/93  Physical Exam  Constitutional: She is well-developed, well-nourished, and in no distress.  HENT:  Head: Normocephalic.  Thyroid normal texture and size.  Pulm:  Some end expiratory wheezing Cardio: normal RRR, no m/r/g   On physical exam, there is no neuropathy appreciated. 5/5 Sensation and vibration sensation intact.      Recent labs:  Results for orders placed or performed in visit on 08/02/17  POCT Glucose (CBG)  Result Value Ref Range   POC Glucose 254 (A) 70 - 99 mg/dl

## 2017-10-04 ENCOUNTER — Ambulatory Visit: Payer: Self-pay | Admitting: Endocrinology

## 2017-10-04 VITALS — Temp 97.9°F | Wt 178.2 lb

## 2017-10-04 DIAGNOSIS — I10 Essential (primary) hypertension: Secondary | ICD-10-CM

## 2017-10-04 DIAGNOSIS — E119 Type 2 diabetes mellitus without complications: Secondary | ICD-10-CM

## 2017-10-04 LAB — GLUCOSE, POCT (MANUAL RESULT ENTRY): POC GLUCOSE: 202 mg/dL — AB (ref 70–99)

## 2017-10-04 MED ORDER — INSULIN GLARGINE 100 UNIT/ML SOLOSTAR PEN: 20.0000 [IU] | PEN_INJECTOR | Freq: Every day | SUBCUTANEOUS | 4 refills | Status: DC

## 2017-10-04 MED ORDER — INSULIN PEN NEEDLE 32G X 4 MM MISC: 12 refills | Status: DC

## 2017-10-04 MED ORDER — INSULIN ASPART 100 UNIT/ML FLEXPEN: 9.0000 [IU] | PEN_INJECTOR | Freq: Three times a day (TID) | SUBCUTANEOUS | 4 refills | Status: DC

## 2017-10-04 MED ORDER — AMLODIPINE BESYLATE 10 MG PO TABS: 10.0000 mg | ORAL_TABLET | Freq: Every day | ORAL | 4 refills | Status: DC

## 2017-10-04 MED ORDER — LISINOPRIL 40 MG PO TABS: 40.0000 mg | ORAL_TABLET | Freq: Every day | ORAL | 4 refills | Status: DC

## 2017-10-04 NOTE — Progress Notes (Signed)
Reviewed.   Poor control of diabetes, hypertension, tobacco abuse Various complaints. Orders and instructions done

## 2017-10-04 NOTE — Patient Instructions (Signed)
Lantus 20 units in evening Novolog 9 with meals.  Increase to 12 with higher carb meals

## 2017-10-05 LAB — THYROID PANEL WITH TSH
Free Thyroxine Index: 1.7 (ref 1.2–4.9)
T3 UPTAKE RATIO: 27 % (ref 24–39)
T4 TOTAL: 6.3 ug/dL (ref 4.5–12.0)
TSH: 0.325 u[IU]/mL — AB (ref 0.450–4.500)

## 2017-11-03 ENCOUNTER — Telehealth: Payer: Self-pay | Admitting: Pharmacist

## 2017-11-03 NOTE — Telephone Encounter (Signed)
11/03/17 Faxed [employer] refill request for Lantus Vials Inject 20 units under the skin daily at bedtime # 2.Forde RadonAJ

## 2017-11-23 ENCOUNTER — Other Ambulatory Visit: Payer: Self-pay | Admitting: Internal Medicine

## 2017-11-29 ENCOUNTER — Ambulatory Visit: Payer: Self-pay

## 2017-11-29 NOTE — Progress Notes (Deleted)
Patient's primary care provider: No primary care provider on file.  Courtney Stafford is a 47 y.o. female last seen on 10/04/2017 and presents today with ***   complaints of heart palpitates once a day that lasts approximately 5-minutes or less and uncontrolled hypertension despite amlodipine, HCTZ, and lisonpril. TSH, free T4, TSI, and TPO were ordered ***  Courtney Stafford has had T1DM for ~33 years, diagnosed at 47 years of age with cardiovascular risk factors of smoking 1/2 ppd and inactivity. Patient A1c tonight on 11/20 was 9.3% (previous one was from 3/22). Tonight A1c was *    Plan 1) T1DM -- need to increase testing of blood sugars. Increase the novolog to 9 units for every meal but when eating a higher carbohydrate meal (starches, rice) increase to 12 units versus a lower carbohydrate meal 6 units. Feelings like hypoglycemic episodes are occurring before breakfast, so will decrease Lantus to 20 units.   2) Hypertension/Heart Palpitatons -- order TSH panel. Increase the lisinopril to 40mg  from 20mg .    Subjective  T1DM Checking blood sugar 1 day when have strips -- typically mid-ranges of 200 in the morning, in the afternoon after lunch 150-300 and before bed and after dinner 150-300. Self reported average blood sugar was 375 with the highest self-reported blood sugar in the past month of that she remembers. Acknowledges feeling like having episodes of hypoglycemia but has not checked. From the glucometer and scripts she brought in, there is one 74, 64, and 53 recorded and drank juice (or at something sweet) to bring blood sugar back up.   Using 8 units of Novolog 3 times a day corresponding with meals. Currently taking 23 units of Lantus QD at night. Denies missing doses. Has not checked blood sugars in some time because ran out of strips but on glucometer (transportation issue).   Patient admits neuorpathy that is diffuse and is "sometimes" worse at times than other primarily in both  legs and R arm. Acknowledges a "tingly" pain that is "aggravating" and all through arm up to shoulder. This is occurring 3-4/week with no aggravating or alleviating factors. Denies onset of pain on exertion. Patient admits increased polydipsia and polyuria but denies blurry vision. Acknowledges potential gastroparesis and endorses dysphagia but denies feeling like food is getting caught.   Diet usually consists of chicken for dinner but is eating sandwiches or noodles for lunch. Denies tea and only drinks diet soda. For exercises goes on walks 1-2/week for 10-15 minutes.   Heart Palpitations Previously reported heart racing and feeling like it is beating out of her chest and may skip on 10/04/2017. Heart palpitations may at times be associated with arm pain. Occurring 1-2x day but only 2-3 days per week. Last menstrual cycle was over 1 year ago. Acknowledges "hot-flashes" and usually wets her face to relieve that feeling. Denies blurry vision. Acknowledges some syncope due to orthostatic changes but not often. Acknowledges dyspnea at both rest and on exertion -- can only walk 10-15 steps without feeling SOB. Has to stop every few minutes when walking into the store to catch her breath.  TSH, free T4, TSI, and TPO were ordered on 10/04/2017 and filled. TSH was low but was consistent with measure from 05/03/2017 and T3/T4 were normal.      Objective Other problems include:  Patient Active Problem List   Diagnosis Date Noted   HTN (hypertension) 10/22/2015   Type 1 diabetes (HCC) 10/22/2015   Anemia 10/22/2015   Restless legs 10/22/2015  Hematuria 04/09/2015     Her  current medications include: Current Outpatient Medications  Medication Sig Dispense Refill   amLODipine (NORVASC) 10 MG tablet Take 1 tablet (10 mg total) by mouth daily. 90 tablet 4   aspirin 81 MG tablet Take 1 tablet (81 mg total) by mouth daily. 90 tablet 4   atorvastatin (LIPITOR) 10 MG tablet Take 1 tablet (10 mg  total) by mouth daily. (Patient taking differently: Take 10 mg by mouth daily at 6 PM. ) 90 tablet 3   Blood Glucose Monitoring Suppl Supplies MISC 1 each by Does not apply route 5 (five) times daily. 500 each 4   gabapentin (NEURONTIN) 300 MG capsule Take 1 capsule (300 mg total) by mouth 4 (four) times daily. 360 capsule 4   hydrochlorothiazide (HYDRODIURIL) 25 MG tablet Take 1 tablet (25 mg total) by mouth daily. 90 tablet 4   insulin aspart (NOVOLOG FLEXPEN) 100 UNIT/ML FlexPen Inject 9 Units into the skin 3 (three) times daily with meals. Add 1 unit for every 50 that sugar is above 100 and 2 units for a high starch meal. 45 mL 4   Insulin Glargine (LANTUS) 100 UNIT/ML Solostar Pen Inject 20-25 Units into the skin daily at 10 pm. 30 mL 4   Insulin Pen Needle (BD PEN NEEDLE NANO U/F) 32G X 4 MM MISC 5 times daily 200 each 12   lisinopril (PRINIVIL,ZESTRIL) 40 MG tablet Take 1 tablet (40 mg total) by mouth daily. 90 tablet 4   No current facility-administered medications for this visit.      Exam:  There were no vitals taken for this visit.  There were no vitals filed for this visit.Blood pressure was134/93  Physical Exam  Constitutional: She is well-developed, well-nourished, and in no distress.  HENT:  Head: Normocephalic.  Thyroid normal texture and size.  Pulm:  Some end expiratory wheezing Cardio: normal RRR, no m/r/g   On physical exam, there is no neuropathy appreciated. 5/5 Sensation and vibration sensation intact.      Recent labs:  Results for orders placed or performed in visit on 10/04/17  Thyroid Panel With TSH  Result Value Ref Range   TSH 0.325 (L) 0.450 - 4.500 uIU/mL   T4, Total 6.3 4.5 - 12.0 ug/dL   T3 Uptake Ratio 27 24 - 39 %   Free Thyroxine Index 1.7 1.2 - 4.9  POCT Glucose (CBG)  Result Value Ref Range   POC Glucose 202 (A) 70 - 99 mg/dl

## 2017-12-08 ENCOUNTER — Other Ambulatory Visit: Payer: Self-pay | Admitting: Internal Medicine

## 2017-12-08 ENCOUNTER — Ambulatory Visit: Payer: Self-pay

## 2017-12-08 VITALS — BP 136/86 | HR 66 | Temp 97.8°F | Wt 178.3 lb

## 2017-12-08 DIAGNOSIS — E119 Type 2 diabetes mellitus without complications: Secondary | ICD-10-CM

## 2017-12-08 DIAGNOSIS — I10 Essential (primary) hypertension: Secondary | ICD-10-CM

## 2017-12-08 LAB — GLUCOSE, POCT (MANUAL RESULT ENTRY): POC Glucose: 327 mg/dl — AB (ref 70–99)

## 2017-12-08 NOTE — Progress Notes (Unsigned)
Patient's primary care provider: No primary care provider on file.   Courtney Stafford is 47 y.o. female has had T1DM for ~33 years, diagnosed at 47 years of age with cardiovascular risk factors of smoking 1/2 ppd and inactivity. Patient A1c tonight on 11/20 was 9.3% (previous one was from 3/22). Tonight A1c was not able to be taken because th A1c machine malfunctioned.    Plan 1) T1DM -- need to increase testing of blood sugars  But does not have enough strips. Discussed using a sliding scale for meal-time insulin. Start with 9-units, if going to eat a big meal, increase to 12-units. If small meal, 6-units with the goal of taking at least a little bit of insulin for every meal. Ordered [facility] A1c to be completed a week before coming to the clinic. Overall, Courtney Stafford reports feeling better and seems to have a bit more glycemic control but impossible to tell without up-to-date A1c. Still extreme variability in blood sugars throughout the day, likely attributed to inconsistent meals and insulin dosing. Will follow-up in one month.   2) Hypertension/Heart Palpitatons -- Patient reports that heart palpitations have dramatically decreased in frequency and severity. TSH wa low but consistent with previous measurements. T3/T4 was normal. Increase the lisinopril to 40mg  from 20mg . Blood pressure was still elevated tonight and needs to follow-up in February.    Subjective  T1DM Checking blood sugar 1 day when have strips -- from the records that she brought today, there is a high degree of variability with no clear pattern. Blood sugars appear to range between 60-200 in the morning, in the afternoon after lunch 150-300 and before bed and after dinner 150-300. Similarly to November, Courtney Stafford reported average blood sugar was 375 with the highest self-reported blood sugar in the past month of that she remembers. She ran out of strips again and was not able to check but was keeping a chart up until then.    Last time we increased the novolog to 9 units for every meal but when eating a higher carbohydrate meal (starches, rice) increase to 12 units versus a lower carbohydrate meal 6 units. Feelings like hypoglycemic episodes happened a few times when adjusting but has leveled out but is almost always taking 9 units for a meal.   Acknowledges missing meal-time doses when blood sugar is not too high or has not been able to check it before a meal.   Acknowledges feelings of hypoglycemia 6-7 times in the past month. Does not attribute it to anything.  Acknowledges feeling like having episodes of hypoglycemia but has not checked. From the glucometer and scripts she brought in, there is one 66 recorded and drank juice (or at something sweet) to bring blood sugar back up. Did not feel low at 118 and considers the 80-130 is normal.   Still feels like she is going low in the mornings even with the Lantus to 20 but only 1-2, which is a big improvement from when the Lantus was at 23.    Using 9 units of Novolog 3 times a day corresponding with meals. Currently taking 23 units of Lantus QD at night. Denies missing doses. Has not checked blood sugars in some time because ran out of strips but on glucometer (transportation issue). Also just never has enough strips.  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 Acknowledges a "tingly" pain that is "aggravating"  and all through arm up to shoulder. This is still occurring 3-4/week with no aggravating or alleviating factors. Denies onset of pain on exertion.   Patient admits increased polydipsia and polyuria (>3-4 times/day) but denies blurry vision. States that the feelings of gastroparesis and endorses have largely resolved per the patient.   Diet usually consists of chicken for dinner but is eating sandwiches or noodles for lunch. Denies tea and only drinks diet soda. For  exercises goes on walks 1-2/week for 10-15 minutes.   Heart Palpitations Previously reported heart racing and feeling like it is beating out of her chest and may skip on 10/04/2017. However, Courtney Stafford reports tonight that it is not happening as often and seems to be resolving. It was occurring 1-2x day but only 2-3 days per week but now only happening 3-times per week and no longer feels like it beating out of her chest. Last menstrual cycle was over 1 year ago. Acknowledges "hot-flashes" and usually wets her face to relieve that feeling.   Acknowledges dyspnea at both rest and on exertion -- can only walk 10-15 steps without feeling SOB. Cannot walk up a flight of stairs without being SOB. Smoker and > 20 pack-years. Has to stop every few minutes when walking into the store to catch her breath.   TSH, free T4, TSI, and TPO were ordered on 10/04/2017 and filled. TSH was low but was consistent with measure from 05/03/2017 and T3/T4 were normal.      Objective Other problems include:  Patient Active Problem List   Diagnosis Date Noted   HTN (hypertension) 10/22/2015   Type 1 diabetes (HCC) 10/22/2015   Anemia 10/22/2015   Restless legs 10/22/2015   Hematuria 04/09/2015     Her  current medications include: Current Outpatient Medications  Medication Sig Dispense Refill   amLODipine (NORVASC) 10 MG tablet Take 1 tablet (10 mg total) by mouth daily. 90 tablet 4   atorvastatin (LIPITOR) 10 MG tablet Take 1 tablet (10 mg total) by mouth daily. (Patient taking differently: Take 10 mg by mouth daily at 6 PM. ) 90 tablet 3   Blood Glucose Monitoring Suppl Supplies MISC 1 each by Does not apply route 5 (five) times daily. 500 each 4   gabapentin (NEURONTIN) 300 MG capsule TAKE 1 CAPSULE BY MOUTH 4 TIMES A DAY 120 capsule 0   hydrochlorothiazide (HYDRODIURIL) 25 MG tablet Take 1 tablet by mouth daily. 90 tablet 0   insulin aspart (NOVOLOG FLEXPEN) 100 UNIT/ML FlexPen Inject 9  Units into the skin 3 (three) times daily with meals. Add 1 unit for every 50 that sugar is above 100 and 2 units for a high starch meal. 45 mL 4   Insulin Glargine (LANTUS) 100 UNIT/ML Solostar Pen Inject 20-25 Units into the skin daily at 10 pm. 30 mL 4   Insulin Pen Needle (BD PEN NEEDLE NANO U/F) 32G X 4 MM MISC 5 times daily 200 each 12   lisinopril (PRINIVIL,ZESTRIL) 40 MG tablet Take 1 tablet (40 mg total) by mouth daily. 90 tablet 4   aspirin 81 MG tablet Take 1 tablet (81 mg total) by mouth daily. (Patient not taking: Reported on 12/08/2017) 90 tablet 4   No current facility-administered medications for this visit.      Exam:  BP 136/86 (BP Location: Left Arm, Patient Position: Sitting, Cuff Size: Normal)    Pulse 66    Temp 97.8 F (36.6 C)    Wt 178 lb 4.8 oz (  80.9 kg)    BMI 34.82 kg/m   Vitals:   12/08/17 1809  BP: 136/86  Pulse: 66  Temp: 97.8 F (36.6 C)  Blood pressure was134/93  Physical Exam  Constitutional: She is well-developed, well-nourished, and in no distress.  HENT:  Head: Normocephalic.  Thyroid normal texture and size.  Pulm:  Some end expiratory wheezing Cardio: normal RRR, no m/r/g  Neuro:      Recent labs:  Results for orders placed or performed in visit on 12/08/17  POCT Glucose (CBG)  Result Value Ref Range   POC Glucose 327 (A) 70 - 99 mg/dl

## 2017-12-09 ENCOUNTER — Telehealth: Payer: Self-pay | Admitting: Pharmacist

## 2017-12-09 NOTE — Telephone Encounter (Signed)
12/09/17 Printed Novo Nordisk refill request for Emerson Electricovolog Flexpen Max daily dose 60 units (5) Adjust units based on carb intake up to 20 units per dose. Also Novofine 32G tips, [facility].Forde RadonAJ

## 2017-12-23 ENCOUNTER — Telehealth: Payer: Self-pay | Admitting: Pharmacist

## 2017-12-23 NOTE — Telephone Encounter (Signed)
12/23/17 Faxed Novo Nordisk refill request for [employer] Electricovolog Flexpen Adjust units based on carb intake up to 20 units per dose # 5, Max daily dose 60 units, also Novofine 32G tips.Forde RadonAJ

## 2017-12-27 ENCOUNTER — Other Ambulatory Visit: Payer: Self-pay

## 2017-12-27 DIAGNOSIS — Z0000 Encounter for general adult medical examination without abnormal findings: Secondary | ICD-10-CM

## 2017-12-28 LAB — CBC WITH DIFFERENTIAL
BASOS: 1 %
Basophils Absolute: 0 10*3/uL (ref 0.0–0.2)
EOS (ABSOLUTE): 0.3 10*3/uL (ref 0.0–0.4)
Eos: 5 %
Hematocrit: 44.3 % (ref 34.0–46.6)
Hemoglobin: 15 g/dL (ref 11.1–15.9)
IMMATURE GRANS (ABS): 0 10*3/uL (ref 0.0–0.1)
IMMATURE GRANULOCYTES: 0 %
LYMPHS: 25 %
Lymphocytes Absolute: 1.5 10*3/uL (ref 0.7–3.1)
MCH: 29.9 pg (ref 26.6–33.0)
MCHC: 33.9 g/dL (ref 31.5–35.7)
MCV: 88 fL (ref 79–97)
Monocytes Absolute: 0.6 10*3/uL (ref 0.1–0.9)
Monocytes: 9 %
NEUTROS PCT: 60 %
Neutrophils Absolute: 3.9 10*3/uL (ref 1.4–7.0)
RBC: 5.01 x10E6/uL (ref 3.77–5.28)
RDW: 13.8 % (ref 12.3–15.4)
WBC: 6.3 10*3/uL (ref 3.4–10.8)

## 2017-12-28 LAB — HEMOGLOBIN A1C
Est. average glucose Bld gHb Est-mCnc: 217 mg/dL
HEMOGLOBIN A1C: 9.2 % — AB (ref 4.8–5.6)

## 2017-12-28 LAB — TSH: TSH: 0.409 u[IU]/mL — ABNORMAL LOW (ref 0.450–4.500)

## 2018-01-03 ENCOUNTER — Ambulatory Visit: Payer: Self-pay | Admitting: Nephrology

## 2018-01-03 VITALS — BP 137/88 | HR 87 | Temp 98.5°F | Wt 175.2 lb

## 2018-01-03 DIAGNOSIS — E119 Type 2 diabetes mellitus without complications: Secondary | ICD-10-CM

## 2018-01-03 LAB — POCT GLYCOSYLATED HEMOGLOBIN (HGB A1C): Hemoglobin A1C: 235

## 2018-01-03 LAB — GLUCOSE, POCT (MANUAL RESULT ENTRY): POC Glucose: 235 mg/dl — AB (ref 70–99)

## 2018-01-03 NOTE — Progress Notes (Signed)
Patient's primary care provider: No primary care provider on file.  Courtney Stafford is a 47 y.o. female last seen on 12/08/2017. T1DM for ~33 years, diagnosed at 47 years of age with cardiovascular risk factors of smoking 1/2 ppd and inactivity.  Pt reports she has been having a cold for a week with bad congestion. No fevers. She reports she can't afford to buy meds for it. She denies fevers and chills. She has a dry cough.   She reports she hasn't been able to self-check her sugars since 12/17/17 b/c she ran out of strips. She checks her sugars 2-3/day until she runs out of strips. Her recorded sugars range from 180-300.  She reports she has been taking all of her meds as Rx. Her insulin regimen includes 20u of Lantus in the nighttime, 9u of Novolog when eating meals, which is reduced to 5u when eating small meals. Pt says she has experienced hypoglycemic symptoms 4-5 times in the past mo, but hasn't been able to check her blood sugars to check if she's hypoglycemic. She reports she hasn't passed out but has felt like she was going to.  Heart Palpitations - pt reports she has no concerns today. She is feeling better.   Pt denies constipation. Pt endorses fatigue in the past 2 weeks, due to cold. Pt reports frequent urination - she gets up 3-4 times at night and this has been happening for more than 6 mo. She denies blood in urine and burning. She endorses constant thirst, which has also been happening for more than 6 mo.  Neuropathy - pt endorses pain in both hands. She says pain has remained the same. Pt endorses she checks her feet frequently. She reports she gets tingling in her feet but no change since last visit.   Pt endorses SOB after she goes on a walk or up the stairs with chest pain. She reports no change since last visit.   Pt denies changes in vision.   Plan 1) T1DM -- top priority is ensuring that pt has access to meds and test strips. She needs to increase testing of blood sugars.  She received 80 strips today and can return in 25 days for refills. Transportation has been the main issue of getting to the clinic to receive test strips, previously have been getting them at the endo clinic when she has appointments. Pt advised to test blood glucose in the morning and if BG > 150 consecutively for three days, advised to increase Lantus to 22u. She will continue this process until FBG are at goal of 100-150.  2) Social determinants of health - Pt is unemployed with no social network (lives alone at home, no family or friends in the area) -- Referral to Joseph, clinical social worker   3) Cold - Main concern of patient is ability to breathe at night, which has been inhibited by congestion. Recommended to use steam to help with decongestion.  - Recommend follow-up with PCP for symptoms   Subjective  T1DM A1C -  9.2 (12/27/2017) Blood Sugar - Pt reports she is out of strips and usually runs out midway through the month when she is checking her sugar 3 times/day.  Medications - Pt she is taking meds as rx and does not miss doses. Pt reports she is taking 9 units of Novolog TID with 20u Lantus daily at night. Diet - Pt reports she eats meats, salads, and fruit. She says drinks water occasionally and mostly diet mountain dew.  Pt shares she is on food stamps.  Exercise - Inactive Heart Palpitations (reported at last visit, TSH below nl 12/27/2017)   Other problems include:  Patient Active Problem List   Diagnosis Date Noted  . HTN (hypertension) 10/22/2015  . Type 1 diabetes (HCC) 10/22/2015  . Anemia 10/22/2015  . Restless legs 10/22/2015  . Hematuria 04/09/2015     Her  current medications include: Current Outpatient Medications  Medication Sig Dispense Refill  . amLODipine (NORVASC) 10 MG tablet Take 1 tablet (10 mg total) by mouth daily. 90 tablet 4  . aspirin 81 MG tablet Take 1 tablet (81 mg total) by mouth daily. (Patient not taking: Reported on 12/08/2017) 90  tablet 4  . atorvastatin (LIPITOR) 10 MG tablet Take 1 tablet (10 mg total) by mouth daily. (Patient taking differently: Take 10 mg by mouth daily at 6 PM. ) 90 tablet 3  . Blood Glucose Monitoring Suppl Supplies MISC 1 each by Does not apply route 5 (five) times daily. 500 each 4  . gabapentin (NEURONTIN) 300 MG capsule TAKE 1 CAPSULE BY MOUTH 4 TIMES A DAY 120 capsule 0  . hydrochlorothiazide (HYDRODIURIL) 25 MG tablet Take 1 tablet by mouth daily. 90 tablet 0  . insulin aspart (NOVOLOG FLEXPEN) 100 UNIT/ML FlexPen Inject 9 Units into the skin 3 (three) times daily with meals. Add 1 unit for every 50 that sugar is above 100 and 2 units for a high starch meal. 45 mL 4  . Insulin Glargine (LANTUS) 100 UNIT/ML Solostar Pen Inject 20-25 Units into the skin daily at 10 pm. 30 mL 4  . Insulin Pen Needle (BD PEN NEEDLE NANO U/F) 32G X 4 MM MISC 5 times daily 200 each 12  . lisinopril (PRINIVIL,ZESTRIL) 40 MG tablet Take 1 tablet (40 mg total) by mouth daily. 90 tablet 4   No current facility-administered medications for this visit.      Exam:  BP 137/88   Pulse 87   Temp 98.5 F (36.9 C)   Wt 175 lb 3.2 oz (79.5 kg)   BMI 34.22 kg/m   Lab Results  Component Value Date   POCGLU 235 (A) 01/03/2018     Physical Exam  General: Well-appearing, in non-apparent distress, responds appropriately to questions HEENT: White sclerae, poor oral health Pulm: CTAB, nl WOB Cardio: normal RRR, no m/r/g  Neuro: Intact sensation in bilateral feet      Recent labs: A1C - 9.2 (12/27/2017) TSH - 0.409 (ref rang 0.45-4.5) 12/27/2017

## 2018-01-06 ENCOUNTER — Other Ambulatory Visit: Payer: Self-pay | Admitting: Internal Medicine

## 2018-01-10 ENCOUNTER — Ambulatory Visit: Payer: Self-pay | Admitting: Licensed Clinical Social Worker

## 2018-01-17 ENCOUNTER — Encounter: Payer: Self-pay | Admitting: Licensed Clinical Social Worker

## 2018-01-17 ENCOUNTER — Ambulatory Visit: Payer: Self-pay | Admitting: Licensed Clinical Social Worker

## 2018-01-17 DIAGNOSIS — F4310 Post-traumatic stress disorder, unspecified: Secondary | ICD-10-CM

## 2018-01-17 DIAGNOSIS — F331 Major depressive disorder, recurrent, moderate: Secondary | ICD-10-CM

## 2018-01-17 DIAGNOSIS — F411 Generalized anxiety disorder: Secondary | ICD-10-CM
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
## 2018-01-24 ENCOUNTER — Telehealth: Payer: Self-pay | Admitting: Pharmacist

## 2018-01-24 NOTE — Telephone Encounter (Signed)
01/24/2018 9:28:48 AM - Lantus Solostar/[employer] renewal  01/24/18 patient enrollment with [employer] expired 10/22/17, printed [employer] renewal application, mailing patient her portion to sign & return, also [facility] provider portion [facility]. Candelaria Stagershaplin to sign. This is for Lantus Solostar Pen Inject 38 units daily, #3.AJ   01/24/2018 9:55:48 AM - [employer] renewal-Lantus  01/24/18 Patient enrollment with [employer] expired 11/18/17. I have printed renewal application for Lantus Vials Inject 20 units daily at bedtime #2. [facility]. Candelaria Stagershaplin to sign, patient has already signed her portion.Forde RadonAJ

## 2018-01-25 ENCOUNTER — Telehealth: Payer: Self-pay | Admitting: Pharmacy Technician

## 2018-01-25 NOTE — Telephone Encounter (Signed)
Received updated proof of income.  Patient eligible to receive medication assistance at [facility] through 2019, as long as eligibility requirements continue to be met.  [facility]

## 2018-01-31 ENCOUNTER — Ambulatory Visit: Payer: Self-pay | Admitting: Endocrinology

## 2018-01-31 ENCOUNTER — Ambulatory Visit: Payer: Self-pay | Admitting: Licensed Clinical Social Worker

## 2018-01-31 VITALS — BP 155/93 | HR 56 | Temp 97.7°F | Wt 177.6 lb

## 2018-01-31 DIAGNOSIS — E119 Type 2 diabetes mellitus without complications: Secondary | ICD-10-CM

## 2018-01-31 DIAGNOSIS — E109 Type 1 diabetes mellitus without complications: Secondary | ICD-10-CM

## 2018-01-31 LAB — GLUCOSE, POCT (MANUAL RESULT ENTRY): POC GLUCOSE: 258 mg/dL — AB (ref 70–99)

## 2018-01-31 NOTE — Progress Notes (Deleted)
Courtney Stafford is a 47 y.o. female last seen on 2/29/2019. T1DM for ~33 years, diagnosed at 47 years of age with cardiovascular risk factors of smoking 1/2 ppd and inactivity. ° ° ° °Plan °1) T1DM -- top priority is still ensuring that pt has access to meds and test strips. Transportation has been the main issue of getting to the clinic to receive test strips, previously have been getting them at the endo clinic when she has appointments. We will get her 50 strips every 25 days and have asked her to come back every three weeks for strips. Blood sugars appear to be better controlled and A1c was 8.3 on our machine which is down from 9.2 on 12/27/2017. Follow up in 3 months.  ° ° °2) Social determinants of health -- [name] has seen Heather and has continued to follow-up with her. Today is her second visit. Reports that it is helping.  ° ° ° ° °Subjective °T1DM °[name] checks her sugars 2-3/day until she runs out of strips. Her recorded sugars range from 180-300.  °She reports she has been taking all of her meds as Rx. Her insulin regimen includes 20u of Lantus in the nighttime, 9u of Novolog when eating meals, which is reduced to 5u when eating small meals. [name] reports that she experiences "low sugars" 2-3-times per week, mostly in the afternoon. Reports that it is associated with having a small meal and taking too much insulin. Denies syncope and states that does not feel like she will "pass out'. Reports that she has a "bad headache" like a migraine when her blood sugars are low. Denies dysphagia but acknowledges some aspects of gastroparesis. Feels like the "food that gets caught" gets better when drinking water. Acknowledges some neuropathy in the foot but denies pain. Most numbness is reported to be in her hands. Patient acknowledges frequent urination "10,000"x per night, polydipsia, and some feelings of fatigued but has troubles sleeping (only sleeping 3-4 hrs/night). °Brought blood sugars with her tonight  and is checking 1-2x per day because of lack of strips. Appears to be ranging in the 100-200 in the morning and the 100-250 at night. The highest reported blood sugar in the past month was approximately 300 one or two times in the past month. Denies change in vision. Eye doctor appointment next month.  ° °A1C -  8.3 (01/31/2018) on our machine °Diet - Pt reports she eats meats, salads, and fruit and has three meals. She says drinks water occasionally and mostly diet mountain dew. Pt shares she is on food stamps.  °Exercise - Inactive ° ° °CVD °Started smoking in 20s and currently smokers about 1/2 ppd.  °Heart Palpitations (reported at last visit, TSH below nl 12/27/2017) but reports that they are less frequent. Less frequent hot flashes too. Denies chest pain. Pt endorses SOB after she goes on a walk or up the stairs with chest pain. She reports no change since last visit.  ° ° ° ° ° °Other problems include:  °Patient Active Problem List  ° Diagnosis Date Noted  °• HTN (hypertension) 10/22/2015  °• Type 1 diabetes (HCC) 10/22/2015  °• Anemia 10/22/2015  °• Restless legs 10/22/2015  °• Hematuria 04/09/2015  ° ° ° °Her current medications include: °Current Outpatient Medications  °Medication Sig Dispense Refill  °• amLODipine (NORVASC) 10 MG tablet Take 1 tablet (10 mg total) by mouth daily. 90 tablet 4  °• aspirin 81 MG tablet Take 1 tablet (81 mg total) by mouth daily. (  Patient not taking: Reported on 12/08/2017) 90 tablet 4  °• atorvastatin (LIPITOR) 10 MG tablet Take 1 tablet (10 mg total) by mouth daily. (Patient taking differently: Take 10 mg by mouth daily at 6 PM. ) 90 tablet 3  °• Blood Glucose Monitoring Suppl Supplies MISC 1 each by Does not apply route 5 (five) times daily. 500 each 4  °• gabapentin (NEURONTIN) 300 MG capsule TAKE 1 CAPSULE BY MOUTH 4 TIMES A DAY 120 capsule 0  °• hydrochlorothiazide (HYDRODIURIL) 25 MG tablet Take 1 tablet by mouth daily. 90 tablet 0  °• insulin aspart (NOVOLOG FLEXPEN) 100  UNIT/ML FlexPen Inject 9 Units into the skin 3 (three) times daily with meals. Add 1 unit for every 50 that sugar is above 100 and 2 units for a high starch meal. 45 mL 4  °• Insulin Glargine (LANTUS) 100 UNIT/ML Solostar Pen Inject 20-25 Units into the skin daily at 10 pm. 30 mL 4  °• Insulin Pen Needle (BD PEN NEEDLE NANO U/F) 32G X 4 MM MISC 5 times daily 200 each 12  °• lisinopril (PRINIVIL,ZESTRIL) 40 MG tablet Take 1 tablet (40 mg total) by mouth daily. 90 tablet 4  ° °No current facility-administered medications for this visit.   ° ° ° °Exam: ° °BP 137/88    Pulse 87    Temp 98.5 °F (36.9 °C)    Wt 175 lb 3.2 oz (79.5 kg)    BMI 34.22 kg/m²  ° °Lab Results  °Component Value Date  ° POCGLU 235 (A) 01/03/2018  ° ° ° °Physical Exam  °General: Well-appearing, in non-apparent distress, responds appropriately to questions °HEENT: White sclerae, poor oral health °Pulm: CTAB with some end-expiratory wheezin, nl WOB °Cardio: normal RRR, no m/r/g. No peripheral edema  °Neuro: Intact sensation in bilateral feet, 5/5.  ° ° ° ° °Recent labs: °A1C - 9.2 (12/27/2017) °TSH - 0.409 (ref rang 0.45-4.5) 12/27/2017 ° °

## 2018-01-31 NOTE — Progress Notes (Signed)
Patient seen with medical student. Doing much better, with A1C down to 8.3%. Agree with plan to check urine albumin, continue current regimen.

## 2018-02-01 LAB — MICROALBUMIN / CREATININE URINE RATIO
Creatinine, Urine: 107.4 mg/dL
MICROALB/CREAT RATIO: 8.5 mg/g{creat} (ref 0.0–30.0)
MICROALBUM., U, RANDOM: 9.1 ug/mL

## 2018-02-06 ENCOUNTER — Telehealth: Payer: Self-pay | Admitting: Pharmacist

## 2018-02-06 NOTE — Telephone Encounter (Signed)
02/06/2018 2:18:36 PM - Novo Nordisk/Novolog flexpen renewal  02/06/18 Faxed [employer] renewal application for [employer] Flexpen Max daily dose 60 units-Adjust units based on carb intake up to 20 units per dose #5.AJ   02/06/2018 2:17:17 PM - Lantus/[employer] renewal  02/06/18 Faxed [employer] renewal application for Lantus vial Inject 20 units daily at bedtime, #2. Forde RadonAJ

## 2018-02-06 NOTE — Progress Notes (Incomplete)
Courtney Stafford is a 47 y.o. female last seen on 2/29/2019. T1DM for ~33 years, diagnosed at 47 years of age with cardiovascular risk factors of smoking 1/2 ppd and inactivity.    Plan 1) T1DM -- top priority is still ensuring that pt has access to meds and test strips. Transportation has been the main issue of getting to the clinic to receive test strips, previously have been getting them at the endo clinic when she has appointments. We will get her 50 strips every 25 days and have asked her to come back every three weeks for strips. Blood sugars appear to be better controlled and A1c was 8.3 on our machine which is down from 9.2 on 12/27/2017. Follow up in 3 months.    2) Social determinants of health -- Marylene Landngela has seen Herbert SetaHeather and has continued to follow-up with her. Today is her second visit. Reports that it is helping.      Subjective T1DM Marylene Landngela checks her sugars 2-3/day until she runs out of strips. Her recorded sugars range from 180-300.  She reports she has been taking all of her meds as Rx. Her insulin regimen includes 20u of Lantus in the nighttime, 9u of Novolog when eating meals, which is reduced to 5u when eating small meals. Marylene Landngela reports that she experiences "low sugars" 2-3-times per week, mostly in the afternoon. Reports that it is associated with having a small meal and taking too much insulin. Denies syncope and states that does not feel like she will "pass out'. Reports that she has a "bad headache" like a migraine when her blood sugars are low. Denies dysphagia but acknowledges some aspects of gastroparesis. Feels like the "food that gets caught" gets better when drinking water. Acknowledges some neuropathy in the foot but denies pain. Most numbness is reported to be in her hands. Patient acknowledges frequent urination "10,000"x per night, polydipsia, and some feelings of fatigued but has troubles sleeping (only sleeping 3-4 hrs/night). Brought blood sugars with her tonight  and is checking 1-2x per day because of lack of strips. Appears to be ranging in the 100-200 in the morning and the 100-250 at night. The highest reported blood sugar in the past month was approximately 300 one or two times in the past month. Denies change in vision. Eye doctor appointment next month.   A1C -  8.3 (01/31/2018) on our machine Diet - Pt reports she eats meats, salads, and fruit and has three meals. She says drinks water occasionally and mostly diet mountain dew. Pt shares she is on food stamps.  Exercise - Inactive   CVD Started smoking in 20s and currently smokers about 1/2 ppd.  Heart Palpitations (reported at last visit, TSH below nl 12/27/2017) but reports that they are less frequent. Less frequent hot flashes too. Denies chest pain. Pt endorses SOB after she goes on a walk or up the stairs with chest pain. She reports no change since last visit.       Other problems include:  Patient Active Problem List   Diagnosis Date Noted   HTN (hypertension) 10/22/2015   Type 1 diabetes (HCC) 10/22/2015   Anemia 10/22/2015   Restless legs 10/22/2015   Hematuria 04/09/2015     Her current medications include: Current Outpatient Medications  Medication Sig Dispense Refill   amLODipine (NORVASC) 10 MG tablet Take 1 tablet (10 mg total) by mouth daily. 90 tablet 4   aspirin 81 MG tablet Take 1 tablet (81 mg total) by mouth daily. (  Patient not taking: Reported on 12/08/2017) 90 tablet 4   atorvastatin (LIPITOR) 10 MG tablet Take 1 tablet (10 mg total) by mouth daily. (Patient taking differently: Take 10 mg by mouth daily at 6 PM. ) 90 tablet 3   Blood Glucose Monitoring Suppl Supplies MISC 1 each by Does not apply route 5 (five) times daily. 500 each 4   gabapentin (NEURONTIN) 300 MG capsule TAKE 1 CAPSULE BY MOUTH 4 TIMES A DAY 120 capsule 0   hydrochlorothiazide (HYDRODIURIL) 25 MG tablet Take 1 tablet by mouth daily. 90 tablet 0   insulin aspart (NOVOLOG FLEXPEN) 100  UNIT/ML FlexPen Inject 9 Units into the skin 3 (three) times daily with meals. Add 1 unit for every 50 that sugar is above 100 and 2 units for a high starch meal. 45 mL 4   Insulin Glargine (LANTUS) 100 UNIT/ML Solostar Pen Inject 20-25 Units into the skin daily at 10 pm. 30 mL 4   Insulin Pen Needle (BD PEN NEEDLE NANO U/F) 32G X 4 MM MISC 5 times daily 200 each 12   lisinopril (PRINIVIL,ZESTRIL) 40 MG tablet Take 1 tablet (40 mg total) by mouth daily. 90 tablet 4   No current facility-administered medications for this visit.      Exam:  BP 137/88    Pulse 87    Temp 98.5 F (36.9 C)    Wt 175 lb 3.2 oz (79.5 kg)    BMI 34.22 kg/m   Lab Results  Component Value Date   POCGLU 235 (A) 01/03/2018     Physical Exam  General: Well-appearing, in non-apparent distress, responds appropriately to questions HEENT: White sclerae, poor oral health Pulm: CTAB with some end-expiratory wheezin, nl WOB Cardio: normal RRR, no m/r/g. No peripheral edema  Neuro: Intact sensation in bilateral feet, 5/5.      Recent labs: A1C - 9.2 (12/27/2017) TSH - 0.409 (ref rang 0.45-4.5) 12/27/2017

## 2018-02-06 NOTE — Telephone Encounter (Signed)
02/06/2018 2:20:07 PM - Letter to patient for MCD denial  02/06/18 I am mailing a letter to patient asking for a current Medicaid denial, we previously had a 2017. Novo Nordisk will require this for full enrollment. Forde RadonAJ

## 2018-02-07 NOTE — Progress Notes (Signed)
Subjective:  Patient ID: Courtney Stafford, female   DOB: May 13, 1971, 47 y.o.   MRN: [id number]    Increase emotional regulation  Courtney Stafford  presents with depression. Onset of symptoms was several years with gradually improving improving. Symptoms have been occurringNot influenced by the time of the day.  Symptoms are currently rated marked. Associated signs and symptoms include: attention difficulties, highly negative and poor social interactionFinancial difficulties Health problems.   She reports that on days when she is not volunteering at [employer] that she is more down and depressed. She notes that she would like to get out of the house more but transportation is the barrier to doing what she needs to do. She notes that she has few friends that are supportive of her and sometimes will actually cause her to feel worthless and horrible about herself. She notes that she often thinks negatively about things because that way she will not get her hopes up. She notes that on the days that she doesn't volunteer that she will find things to do around the house. She denies suicidal and homicidal thoughts.  Therapeutic Interventions: Cognitive Behavioral therapy was utilized by the clinician during today's session focused on patient's depression and affect on normal cognition. Clinician processed with the patient regarding how she has been doing since their last session. Clinician processed with the patient regarding how her overall mood has been. Clinician processed with the patient regarding the barriers to getting out of the house as often as she would like. Clinician processed with the patient regarding who is in her support system. Clinician encouraged the patient to set healthy boundaries with her friends when they are upsetting her by turning off her phone or not answering their calls. Clinician processed with the patient regarding her feelings towards her current situation. Clinician asked the  patient what her overall routine is. Clinician explained that she wants her to try and turn her negative thoughts into positive thought through journaling about things she is grateful for and good things that happen to her for the next few weeks.  Return visit in 3 weeks.    Effectiveness:  Established problem, stable/improving (1). Progressing It is felt more time is needed for Interventions to work.  . Patient is fully  Other:  orientated to time and place. Patient's Appropriate into problems. Active. Thought process is  Coherent.Minimal: No identifiable suicidal ideation.  Patients presenting with no risk factors but with morbid ruminations; may be classified as minimal risk based on the severity of the depressive symptoms and None.   Homework: Work on positivity and gratitude journal  Plan: Follow up with Carey BullocksHeather Simpson, LCSW at [facility] in two to three weeks or earlier if needed.

## 2018-02-23 ENCOUNTER — Ambulatory Visit: Payer: Self-pay | Admitting: Licensed Clinical Social Worker

## 2018-02-23 ENCOUNTER — Ambulatory Visit: Payer: Self-pay | Admitting: Ophthalmology

## 2018-03-06 ENCOUNTER — Other Ambulatory Visit: Payer: Self-pay | Admitting: Internal Medicine

## 2018-03-06 DIAGNOSIS — I10 Essential (primary) hypertension: Secondary | ICD-10-CM

## 2018-03-09 ENCOUNTER — Ambulatory Visit: Payer: Self-pay | Admitting: Licensed Clinical Social Worker

## 2018-03-09 ENCOUNTER — Ambulatory Visit: Payer: Self-pay | Admitting: Ophthalmology

## 2018-03-09 DIAGNOSIS — F411 Generalized anxiety disorder: Secondary | ICD-10-CM

## 2018-03-09 NOTE — Progress Notes (Signed)
Subjective:  Patient ID: Courtney Stafford, female   DOB: 02-Sep-1971, 47 y.o.   MRN: [id number]    Increase emotional regulation  Courtney Stafford  presents with anxiety. Onset of symptoms was in the last few years with gradually improving improving. Symptoms have been occurringNot influenced by the time of the day.  Symptoms are currently rated moderate. Associated signs and symptoms include: attention difficulties, highly negative and poor social interactionFinancial difficulties Health problems Other: transportation.   She reports that she has been doing okay. She notes that she has good days and bad days in terms of her anxiety. She notes that she was stressed out in the last month when her eye appointment was cancelled and was worried about running out of her glucose strips. She explains that she has been worrying about getting her documentation completed for Medication Management, her daughter getting married in June, and having to go to jury duty. She explains that transportation is the biggest stress that she experiences any time that she has to travel outside of the bus line. She notes that her fear is that with jury duty that there is not a bus that travels to [place].   Therapeutic Interventions: Cognitive Behavioral therapy was utilized by the clinician focusing on patient's anxiety and effect on normal cognition. Clinician processed with the patient regarding how she has been doing since the last session. Clinician processed with the patient regarding the sources of her stress in the last month. Clinician explained that it sounds like she is on top of things and will figure out what she needs to do. Clinician explained that she would contact jury duty a week prior to explain that she relies on [employer] and that it does not travel to [place]. Clinician encouraged the patient to utilize relaxation techniques such as deep breathing when she is anxious and stressed.   Return visit in 1 month.     Effectiveness:  Established problem, stable/improving (1). Progressing It is felt more time is needed for Interventions to work.  . Patient is fullyOther:  orientated to time and place. Patient's Appropriate into problems. Active. Thought process is  Coherent.Minimal: No identifiable suicidal ideation.  Patients presenting with no risk factors but with morbid ruminations; may be classified as minimal risk based on the severity of the depressive symptoms and None.   Homework: ChiropodistUtilize relaxation techniques.   Plan: Follow up with Courtney BullocksHeather Shabria Egley, LCSW at [facility] in one month or earlier if needed.

## 2018-03-16 ENCOUNTER — Other Ambulatory Visit: Payer: Self-pay | Admitting: Ophthalmology

## 2018-03-22 ENCOUNTER — Other Ambulatory Visit: Payer: Self-pay | Admitting: Ophthalmology

## 2018-03-22 LAB — HM DIABETES EYE EXAM

## 2018-03-31 ENCOUNTER — Telehealth: Payer: Self-pay | Admitting: Pharmacist

## 2018-03-31 NOTE — Telephone Encounter (Signed)
03/31/2018 12:04:34 PM - Lantus refill  03/31/18 Received notice from [employer] time for refill on Lantus Vials-printed refill request and will send to [facility] for Dr. Candelaria Stagers to sign-Lantus Vials Inject 20 units under the skin daily at bedtime.[name]

## 2018-04-06 ENCOUNTER — Ambulatory Visit: Payer: Self-pay | Admitting: Licensed Clinical Social Worker

## 2018-04-06 ENCOUNTER — Other Ambulatory Visit: Payer: Self-pay | Admitting: Internal Medicine

## 2018-04-06 DIAGNOSIS — F331 Major depressive disorder, recurrent, moderate: Secondary | ICD-10-CM
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
## 2018-04-14 ENCOUNTER — Telehealth: Payer: Self-pay | Admitting: Pharmacist

## 2018-04-14 NOTE — Telephone Encounter (Signed)
04/14/2018 9:44:24 AM - Lantus refill  04/14/18 I faxed a refill request to [employer] for Lantus Vials Inject 20 units under the skin daily at bedtime.[name]

## 2018-05-02 ENCOUNTER — Ambulatory Visit: Payer: Self-pay | Admitting: Licensed Clinical Social Worker

## 2018-05-02 ENCOUNTER — Ambulatory Visit: Payer: Self-pay | Admitting: Endocrinology

## 2018-05-02 VITALS — BP 150/102 | HR 71 | Temp 98.2°F | Ht 60.0 in | Wt 175.9 lb

## 2018-05-02 DIAGNOSIS — E119 Type 2 diabetes mellitus without complications: Secondary | ICD-10-CM

## 2018-05-02 DIAGNOSIS — F331 Major depressive disorder, recurrent, moderate: Secondary | ICD-10-CM

## 2018-05-02 DIAGNOSIS — F411 Generalized anxiety disorder: Secondary | ICD-10-CM

## 2018-05-02 LAB — GLUCOSE, POCT (MANUAL RESULT ENTRY): POC GLUCOSE: 438 mg/dL — AB (ref 70–99)
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
## 2018-05-02 NOTE — Progress Notes (Signed)
Courtney Stafford is a 47 y.o. female last seen on 01/31/18 with T1DM for ~33 years, diagnosed at 47 years of age with cardiovascular risk factors of smoking 1/2 ppd and inactivity. Most recent A1c was 8.3 (01/31/2018) on our machine and TSH was 0.409 (12/27/2017).    Plan 1) T1DM -- top priority is still ensuring that pt has access to meds and test strips. Transportation has been the main issue of getting to the clinic to receive test strips, previously have been getting them at the endo clinic when she has appointments. We will get her 50 strips every 25 days and have asked her to come back every three weeks for strips. Blood sugars appear to be better controlled and A1c was 8.3 on our machine which is down from 9.2 on 12/27/2017. Follow up in 3 months.    2) Social determinants of health -- Courtney Stafford has seen Herbert SetaHeather and has continued to follow-up with her. Today is her second visit. Reports that it is "helping out a whole let".   3) HTN -- blood pressure was elevated slightly tonight (150/102). Patient reports it is unusual. Will continue to monitor.       Subjective T1DM Courtney Stafford checks her sugars 2-3/day until she runs out of strips. Her recorded sugars range from 180-300.  She reports she has been taking all of her meds as prescibed. Her insulin regimen includes 20u of Lantus in the nighttime, 9u of Novolog when eating meals, which is reduced to 5u when eating small meals. Courtney Stafford reports that she hasn't experienced as many lows this past month, although on glucose monitors, there were two "lows", a 60 and 70. Courtney Stafford acknowledges it was likely on days when she did not have enough to eat but was not sure. The highest blood sugar was 400, which happens approximately 3-4 times/week. However, this typically happens after checking 2 hours after a meal.   Reports that fasting blood sugars in the morning are 150. Reports that blood sugar at night is approximately 200.   Courtney Stafford acknowledges increased  feelings of "numbness and tingling" at night and increased dysphagia with emesis. Dysphagia appears to improve with drinking water. The dysphagia and emesis has started in the past in week and has happen before the past but is now occurring with more frequency. Acknowledges polydipsia and polyuria, "feels like 20,000 times". Denies syncope and states that does not feel like she will "pass out'. Reports that she has a "bad headache" like a migraine when her blood sugars are low." gets better when drinking water. Acknowledges some neuropathy in the foot but denies pain.    Denies change in vision. Saw eye doctor appointment last month -- reports no change.   Diet - Pt reports she eats meats, salads, and fruit and has three meals. She says drinks water occasionally and mostly diet mountain dew. Pt states that she is on food stamps.  Exercise - Inactive   CVD Started smoking in 20s and currently smokers about 1/2 ppd.  Denies heart Palpitations (reported at last visit, TSH below nl 12/27/2017) but reports that they are less frequent. Less frequent hot flashes too. Denies chest pain. Pt endorses SOB after she goes on a walk or up the stairs with chest pain. She reports no change since last visit.       Other problems include:  Patient Active Problem List   Diagnosis Date Noted   HTN (hypertension) 10/22/2015   Type 1 diabetes (HCC) 10/22/2015   Anemia 10/22/2015  Restless legs 10/22/2015   Hematuria 04/09/2015     Her current medications include: Current Outpatient Medications  Medication Sig Dispense Refill   amLODipine (NORVASC) 10 MG tablet Take 1 tablet (10 mg total) by mouth daily. 90 tablet 4   aspirin 81 MG tablet Take 1 tablet (81 mg total) by mouth daily. (Patient not taking: Reported on 12/08/2017) 90 tablet 4   atorvastatin (LIPITOR) 10 MG tablet Take 1 tablet (10 mg total) by mouth daily. (Patient taking differently: Take 10 mg by mouth daily at 6 PM. ) 90 tablet 3    Blood Glucose Monitoring Suppl Supplies MISC 1 each by Does not apply route 5 (five) times daily. 500 each 4   gabapentin (NEURONTIN) 300 MG capsule TAKE 1 CAPSULE BY MOUTH 4 TIMES A DAY 120 capsule 0   hydrochlorothiazide (HYDRODIURIL) 25 MG tablet Take 1 tablet by mouth daily. 90 tablet 0   insulin aspart (NOVOLOG FLEXPEN) 100 UNIT/ML FlexPen Inject 9 Units into the skin 3 (three) times daily with meals. Add 1 unit for every 50 that sugar is above 100 and 2 units for a high starch meal. 45 mL 4   Insulin Glargine (LANTUS) 100 UNIT/ML Solostar Pen Inject 20-25 Units into the skin daily at 10 pm. 30 mL 4   Insulin Pen Needle (BD PEN NEEDLE NANO U/F) 32G X 4 MM MISC 5 times daily 200 each 12   lisinopril (PRINIVIL,ZESTRIL) 40 MG tablet Take 1 tablet (40 mg total) by mouth daily. 90 tablet 4   No current facility-administered medications for this visit.      Exam:  BP 137/88    Pulse 87    Temp 98.5 F (36.9 C)    Wt 175 lb 3.2 oz (79.5 kg)    BMI 34.22 kg/m   Lab Results  Component Value Date   POCGLU 235 (A) 01/03/2018     Physical Exam  General: Well-appearing, in non-apparent distress, responds appropriately to questions HEENT: White sclerae, poor oral health Pulm: CTAB with some end-expiratory wheezin, nl WOB Cardio: normal RRR, no m/r/g. No peripheral edema  Neuro: Intact sensation in bilateral feet, 5/5.      Recent labs: A1C - 9.2 (12/27/2017) TSH - 0.409 (ref rang 0.45-4.5) 12/27/2017  A1c in future if POC device not available.  More glucose monitoring should be helpful.  Abbott Josephine Igo would be outstanding!  She seems to be doing well.   Patient interviewed and discussed with the student at the visit. Agree with the findings and plan above. Elmer Picker, MD, PhD

## 2018-05-11 ENCOUNTER — Encounter: Payer: Self-pay | Admitting: Licensed Clinical Social Worker

## 2018-05-30 ENCOUNTER — Other Ambulatory Visit: Payer: Self-pay

## 2018-05-30 ENCOUNTER — Ambulatory Visit: Payer: Self-pay | Admitting: Licensed Clinical Social Worker

## 2018-05-30 ENCOUNTER — Ambulatory Visit: Payer: Self-pay

## 2018-05-30 DIAGNOSIS — E119 Type 2 diabetes mellitus without complications: Secondary | ICD-10-CM

## 2018-05-30 DIAGNOSIS — F331 Major depressive disorder, recurrent, moderate: Secondary | ICD-10-CM

## 2018-05-31 LAB — HEMOGLOBIN A1C
ESTIMATED AVERAGE GLUCOSE: 192 mg/dL
HEMOGLOBIN A1C: 8.3 % — AB (ref 4.8–5.6)
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
## 2018-06-06 ENCOUNTER — Telehealth: Payer: Self-pay | Admitting: Licensed Clinical Social Worker

## 2018-06-06 NOTE — Telephone Encounter (Signed)
Clinician reached out to Thalia Partyob Killebrew at [facility] regarding a referral she had placed in the [place] [employer] portal for Ms. Hinds to receive assistance with transportation.   She explained that she thought Ms. Ritthaler was considered disabled and would qualify for the reduced fair rate application but at their last session, she informed her that she is not disabled. She explained that [employer] pays for her rent and utiliies through a program and the only other income that she receives is food stamps. She explained that Ms. Gamm is uninsured and does not plan to apply for disability.  Mr. Mikael SprayKillibrew explained that if [employer] is willing to sign off on the application that she will be eligible to receive the reduced fair card for [facility].

## 2018-06-06 NOTE — Telephone Encounter (Signed)
Clinician left voicemail with call back information to update Courtney Stafford on the transportation referral.

## 2018-07-04 ENCOUNTER — Ambulatory Visit: Payer: Self-pay | Admitting: Licensed Clinical Social Worker

## 2018-07-04 ENCOUNTER — Encounter: Payer: Self-pay | Admitting: Licensed Clinical Social Worker

## 2018-07-04 ENCOUNTER — Ambulatory Visit: Payer: Self-pay

## 2018-07-04 VITALS — BP 171/105 | HR 76 | Temp 97.9°F | Ht 60.0 in | Wt 176.0 lb

## 2018-07-04 VITALS — BP 171/105 | HR 76 | Temp 97.9°F | Ht 60.0 in | Wt 176.2 lb

## 2018-07-04 DIAGNOSIS — Z634 Disappearance and death of family member: Secondary | ICD-10-CM

## 2018-07-04 DIAGNOSIS — E119 Type 2 diabetes mellitus without complications: Secondary | ICD-10-CM

## 2018-07-04 DIAGNOSIS — F4321 Adjustment disorder with depressed mood: Secondary | ICD-10-CM

## 2018-07-04 DIAGNOSIS — F331 Major depressive disorder, recurrent, moderate: Secondary | ICD-10-CM

## 2018-07-04 LAB — GLUCOSE, POCT (MANUAL RESULT ENTRY): POC GLUCOSE: 62 mg/dL — AB (ref 70–99)

## 2018-07-04 NOTE — Progress Notes (Signed)
Follow up Diabetes/ Endocrine [facility]     Patient ID: Courtney Stafford, female   DOB: 01/06/1971, 47 y.o.   MRN: [id number] Assessment:  Courtney Garibaldingela M Pippins is a 47 y.o. female who is seen in follow up for Diabetes mellitus without complication (HCC) [E11.9] at the request of No primary care provider on file..  Encounter Diagnoses 1. Diabetes mellitus without complication (HCC)     Plan:    1. T1DM Patient's circumstances have changed appetite, despite continuing insulin regimen as prescribed. Patient reports feelings of low 4-5x in the past week, which is concerning for worsening hypoglycemic episodes. HbA1c 8.3 on 05/30/2018. Most recent Umic of 9.1 on 01/31/2018 with Micro Alb/Cr ratio of 8.5 on 01/31/2018. -  Patient advised to change units of Novolog accordingly to fit with smaller meals. - Cont. Lantus 20u - Refill glucometer test strips - f/u scheduled for Oct. 2019  Patient Instructions  Novolog can be used every 4 hours as needed to correct high glucoses: >200 + 1unit > 250 +2units >300 +3 > 350 +4 > 400 +5  This can be in addition to mealtime Novolog if glucoses are high before eating.  Or, can be given if glucoses are high even when not eating -- as long as you have not given Novolog in the past 4 hours.   If eating small meal, can reduce Novolog to 4-5 units to reduce risk for post meal hypoglycemia.    No orders of the defined types were placed in this encounter.    2. HTN Patient has elevated BP of 171/105 today in clinic. Patient reports taking amlodipine, lisinopril, HCTZ without missing any doses or without any side effects of dizziness or passing out. Pharmacist noted that refills for lisinopril and amlodipine through [facility] has not be refilled since 11-10/2017. [facility] and medication reconciliation.  - [facility] of [facility]N  Subjective:  Medication regimen includes  7-9u Novolog every meal, 20u Lantus at night. Recently haven't had an appetite given that her son has passed away today. Ate a sandwich, noodles, candy bar today, but still taking insulin doses despite not eating as much. Ran out of strips a couple days ago. Tries to check BG 3x a day during the meals and when having feelings of low. Feeling 4-5x in the past week of feeling low and will drink juice/eat candy when feeling low. When have adequate bus passes, [facility] weeks for strips. BG checks range Feelings of fatigue and can't sleep at nighttime, but has been for a long time. Will occasionally get up during the night to urinate, but only 1-2x during the night if anything. Reports increased frequency of urination, feeling frustrated of having to get up to the bathroom frequently. Frequency of urination has been getting worse. Reports thirst, but feeling that thirst has been getting worse. Denies chest pain. Tingling sensations in fingers and toes, reports getting a bit worse with aggravated feelings.Takes gabapentin, but reports it not helping with the sensations. Had surgery in the L hand for carpal tunnel with feelings of it coming back. Denies dizziness or feelings of passing out, though notes low BG. Denies muscle aches/pain. Smokes 1/2 pack once a week, but doesn't smoke everyday. Have been smoking off and on for about 10 years. Have tried to quit smoking, but has been feeling more stressed and therefore, not interested in  quitting currently.       Review of Systems  Constitutional: Positive for appetite change and fatigue. Negative for chills.  Cardiovascular: Negative for chest pain.  Genitourinary: Negative for difficulty urinating, dysuria and hematuria.  Musculoskeletal: Negative for myalgias.  Neurological: Negative for dizziness and syncope.    Courtney Garibaldingela M Cornfield  has a past medical history of Diabetes mellitus without complication (HCC) and Hypertension.  Family  History, Social History, current Medications and allergies reviewed and updated in Epic.  Objective:    Blood pressure (!) 171/105, pulse 76, temperature 97.9 F (36.6 C), height 5' (1.524 m), weight 176 lb (79.8 kg).  Physical Exam  Constitutional: She is oriented to person, place, and time.  HENT:  Head: Normocephalic and atraumatic.  Eyes: Conjunctivae are normal. Right eye exhibits no discharge. Left eye exhibits no discharge.  Cardiovascular: Normal rate, regular rhythm and normal heart sounds. Exam reveals no gallop and no friction rub.  No murmur heard. Pulmonary/Chest: Effort normal and breath sounds normal. No respiratory distress. She has no wheezes.  Neurological: She is alert and oriented to person, place, and time.  Skin: Skin is warm. No erythema.  Psychiatric: Thought content normal.        Data : I have personally reviewed pertinent labs and imaging studies, if indicated,  with the patient in clinic today.   Lab Orders  No laboratory test(s) ordered today    HC Readings from Last 3 Encounters:  [facility]    Wt Readings from Last 3 Encounters:  07/04/18 176 lb (79.8 kg)  07/04/18 176 lb 3.2 oz (79.9 kg)  05/02/18 175 lb 14.4 oz (79.8 kg)

## 2018-07-04 NOTE — Patient Instructions (Signed)
Novolog can be used every 4 hours as needed to correct high glucoses: >200 + 1unit > 250 +2units >300 +3 > 350 +4 > 400 +5  This can be in addition to mealtime Novolog if glucoses are high before eating.  Or, can be given if glucoses are high even when not eating -- as long as you have not given Novolog in the past 4 hours.   If eating small meal, can reduce Novolog to 4-5 units to reduce risk for post meal hypoglycemia.

## 2018-07-05 ENCOUNTER — Other Ambulatory Visit: Payer: Self-pay | Admitting: Internal Medicine

## 2018-07-05 ENCOUNTER — Other Ambulatory Visit: Payer: Self-pay | Admitting: Adult Health Nurse Practitioner

## 2018-07-05 DIAGNOSIS — I10 Essential (primary) hypertension: Secondary | ICD-10-CM

## 2018-07-06 NOTE — Progress Notes (Signed)
Subjective:  Patient ID: Courtney Stafford, female   DOB: 01-07-1971, 47 y.o.   MRN: [id number]    Increase emotional regulation  Courtney Stafford  presents with grief. Onset of symptoms was this morning with gradually worsening worsening. Symptoms have been occurringNot influenced by the time of the day.  Symptoms are currently rated moderate. Associated signs and symptoms include: anger, attention difficulties, highly negative, poor social interaction, stubborn and grief.Loss of her son..   She explains that she is not doing too well because today is her birthday and her daughter shared the news with her this morning that her son passed away. She explains that she knew her son was in the hospital for bed sores but hasn't had a chance to talk to his girlfriend to get all of the details. She reports that she just can't believe that he went into the hospital for something and now he is dead. She notes that she just spoke with him on Sunday. She denies suicidal and homicidal thoughts.  Therapeutic Interventions: Cognitive Behavioral therapy was utilized by the clinician focusing on her symptoms of grief. Clinician expressed her condolences for the loss of her son. Clinician processed with the patient regarding the events that led up to her son's death. Clinician explained that it sounds like she has a lot of unanswered questions. Clinician explained that grief is a process and is different for everyone. Clinician discussed the five stages of grief: denial, depression, anger, bargaining, and acceptance. Clinician explained that their is not a set time period to grieve and that she just needs to do what she thinks is best at this point in time. Clincian explained that due to this devastating news that she would like her to follow up in one week instead of one month.   Return visit in 1 week.    Effectiveness:  New problem, with additional work-up planned (4). Not Progressing It is felt more time is needed for  Interventions to work.  . Patient is fully or not fully Other:  orientated to time and place. Patient's Appropriate into problems. Active. Thought process is  Coherent.Minimal: No identifiable suicidal ideation.  Patients presenting with no risk factors but with morbid ruminations; may be classified as minimal risk based on the severity of the depressive symptoms and None.   Homework:   Plan: Follow up with Courtney BullocksHeather Anshika Pethtel, LCSW at [facility] in one week or earlier if needed.

## 2018-07-11 ENCOUNTER — Ambulatory Visit: Payer: Self-pay | Admitting: Licensed Clinical Social Worker

## 2018-07-18 ENCOUNTER — Telehealth: Payer: Self-pay | Admitting: Pharmacist

## 2018-07-18 NOTE — Telephone Encounter (Signed)
07/18/2018 2:59:23 PM - Novolog Flexpen & Tips refill  07/18/18 Faxed Novo Nordisk refill request for [name] Adjust units based on carb intake up to 20 units per dose #5 (max daily dose 60 units).AJ   07/18/2018 2:58:14 PM - Lantus Vials refill  07/18/18 Faxed [employer] refill request for Lantus Vials Inject 20 units daily at bedtime # 2.[name]

## 2018-07-20 ENCOUNTER — Ambulatory Visit: Payer: Self-pay | Admitting: Licensed Clinical Social Worker

## 2018-07-20 DIAGNOSIS — Z634 Disappearance and death of family member: Principal | ICD-10-CM

## 2018-07-20 DIAGNOSIS — F4321 Adjustment disorder with depressed mood: Secondary | ICD-10-CM

## 2018-07-20 NOTE — Progress Notes (Signed)
Subjective:  Patient ID: Courtney Stafford, female   DOB: 1971/06/25, 47 y.o.   MRN: [id number]    Increase emotional regulation  Courtney Stafford  presents with grief. Onset of symptoms was in the last few weeks with newly diagnosed. Symptoms have been occurringNot influenced by the time of the day.  Symptoms are currently rated moderate. Associated signs and symptoms include: poor appetite, anger, attention difficulties, poor social interaction, sadness and not sleeping well.Other: grief.   She explains that she went to see her son's body on Saturday before he was cremated. She notes that his girlfriend had necklaces made where a bit of her son's ashes are in it to give to family. She notes that it makes her feel good to have a part of him with her everywhere she goes. She notes that she is a little worried about her daughter who didn't show to the funeral and isn't answering her texts or phone calls. She notes that she misses her son. She reports that she has taking a break from volunteering. She notes that she eventually wants to go back. She notes that her appetite is not the best and she isn't really sleeping. She denies suicidal and homicidal thoughts.   Therapeutic Interventions: Cognitive Behavioral therapy was utilized by the clinician focusing symptoms of grief. Clinician processed with the patient regarding how she has been doing since the last session. Clinician processed with the patient regarding how the funeral went. Clinician explained that her son's girlfriend did a thoughtful thing for the family and now she has a piece of her son with her in a necklace for always. Clinician explained to the patient that everyone processes grief in their own ways so it may be that her daughter is temporarily shutting out the world until she is ready to resurface. Clinician encouraged the patient to take the time to do what she needs to heal and doesn't owe anyone an explanation. Clinician explained that she  can take as much time as she needs to fully heal and come to turn with the sudden passing of her son. Clinician encouraged the patient to practice self care.   Return visit in 1 week.    Effectiveness:  New problem, with additional work-up planned (4). Not Progressing It is felt more time is needed for Interventions to work. . Patient is fully Other:  orientated to time and place. Patient's Appropriate into problems. Active. Thought process is  Coherent.Minimal: No identifiable suicidal ideation.  Patients presenting with no risk factors but with morbid ruminations; may be classified as minimal risk based on the severity of the depressive symptoms and None.   Homework: Practice self care.   Plan: Follow up with [name], LCSW at [facility] in one to two weeks or earlier if needed. Marland Kitchen

## 2018-08-01 ENCOUNTER — Ambulatory Visit: Payer: Self-pay | Admitting: Licensed Clinical Social Worker

## 2018-08-01 DIAGNOSIS — F4321 Adjustment disorder with depressed mood: Secondary | ICD-10-CM

## 2018-08-01 DIAGNOSIS — Z634 Disappearance and death of family member: Secondary | ICD-10-CM

## 2018-08-01 DIAGNOSIS — F331 Major depressive disorder, recurrent, moderate: Secondary | ICD-10-CM

## 2018-08-01 NOTE — Progress Notes (Signed)
Subjective:  Patient ID: Courtney GaribaldiAngela M Stafford, female   DOB: Sep 26, 1971, 47 y.o.   MRN: [id number]    Increase emotional regulation  [name] presents with depression and grief. Onset of symptoms was in the last month with unchanged unchanged. Symptoms have been occurringNot influenced by the time of the day.  Symptoms are currently rated marked. Associated signs and symptoms include: anger, poor social interaction, sadness and grief.Loss of son.  She reports that she is having problems with her sleep and appetite. She notes that sometimes she just feels like giving up. She notes that she is worried because she has yet to hear from her daughter. She notes that she has tried to reach out to her on facebook but doesn't have enough minutes on her phone to call her. She notes that she was invited to church around the time of her son's funeral and is thinking about going. She explains that she was told that you can call a number to arrange from someone from the church to come and pick her up. She notes that she doesn't really have much support at all. She explains that she wants to figure out a way to get through this. She denies suicidal and homicidal thoughts.   Therapeutic Interventions: Cognitive Behavioral therapy was utilized by the clinician discussing the stages of grief and affect on behaviors. Clinician processed with the patient regarding how she has been doing since the last session. Clinician explained that she realizes that she is going through a deep depression and stage of grief but giving up is no the answer. Clinician asked the patient if she knew her daughter's address and could send a letter instead. Clinician explained that each person that experiences grief will process it in a different way. Clinician explained that she thinks joining a church would be a great way to establish a support system. Clinician explained that she also can't make an excuse not to go when she can arrange for  transportation from the church. Clinician explained that would she would like her to do is spend about twenty minutes each day journal about memories, thoughts, grief, etc. Clinician explained that being able to write in a journal is a therapeutic healing process and can help her to process her emotions in between sessions.   Return visit in 1 week.    Effectiveness:  Review of last therapy session (1). Progressing It is felt more time is needed for Interventions to work.  . Patient is fully  Other:  orientated to time and place. Patient's Appropriate into problems. Active. Thought process is  Coherent.Minimal: No identifiable suicidal ideation.  Patients presenting with no risk factors but with morbid ruminations; may be classified as minimal risk based on the severity of the depressive symptoms and None.   Homework: Journal  Plan: Follow up with Carey BullocksHeather Simpson, LCSW at [facility] in one week or earlier if needed.

## 2018-08-08 ENCOUNTER — Ambulatory Visit: Payer: Self-pay | Admitting: Licensed Clinical Social Worker

## 2018-08-08 DIAGNOSIS — F4321 Adjustment disorder with depressed mood: Secondary | ICD-10-CM

## 2018-08-08 DIAGNOSIS — Z634 Disappearance and death of family member: Principal | ICD-10-CM

## 2018-08-09 NOTE — Progress Notes (Signed)
Subjective:  Patient ID: [name], female   DOB: [birth year], 47 y.o.   MRN: [id number]    Increase emotional regulation  Courtney Stafford  presents with grief. Onset of symptoms was in the last month with gradually improving improving. Symptoms have been occurringNot influenced by the time of the day.  Symptoms are currently rated moderate. Associated signs and symptoms include: poor social interaction and occasional crying spells. Loss of son.   She reports that she has been doing a little bit better this week compared to last week. She explains that today was her first day back volunteering at [employer]. She notes that she felt that it was time to start to get back into her routine again. She notes that she misplaced her journal pages, found them this morning, and plans to journal today. She explains that she is still trying to get in touch with her daughter. She notes that she will have minutes on her phone tomorrow and is hoping to be able to get her daughter to answer the phone when she calls. She notes that she feels like she has lost both her son and her daughter. She notes that she has been praying and doing what she needs to do to heal. She denies suicidal and homicidal thoughts.   Therapeutic Interventions: Cognitive Behavioral therapy was utilized by providing emotional support and encouragement to the patient. Clinician processed with the patient regarding how she has been doing since the last session. Clinician processed with the patient regarding how she is doing in terms of her grief this week compared to last week. Clinician explained that she is glad to hear that she has found some motivation this week and is back to volunteering again. Clinician encouraged the patient to take things day by day. Clinician asked the patient if she is unable to reach her daughter that she could always call her work to explain that she has been trying to get in touch with her and make sure she is okay.  Clinician explained to the patient that she has not lost her daughter but she is choosing to deal with grief in a different way than she is.   Return visit in 2 weeks.    Effectiveness:  Review of last therapy session (1). Progressing It is felt more time is needed for Interventions to work.  . Patient is fully Other:  orientated to time and place. Patient's Appropriate into problems. Active. Thought process is  Coherent.Minimal: No identifiable suicidal ideation.  Patients presenting with no risk factors but with morbid ruminations; may be classified as minimal risk based on the severity of the depressive symptoms and None.   Homework: Journal and practice self care.  Plan: Follow up with Courtney BullocksHeather Adynn Caseres, LCSW at [facility] in two weeks or earlier if needed.

## 2018-08-14 ENCOUNTER — Other Ambulatory Visit: Payer: Self-pay | Admitting: Internal Medicine

## 2018-08-14 DIAGNOSIS — I10 Essential (primary) hypertension: Secondary | ICD-10-CM

## 2018-08-22 ENCOUNTER — Ambulatory Visit: Payer: Self-pay | Admitting: Licensed Clinical Social Worker

## 2018-08-22 ENCOUNTER — Telehealth: Payer: Self-pay | Admitting: Licensed Clinical Social Worker

## 2018-08-22 NOTE — Telephone Encounter (Signed)
Ms. Bonet called to cancel appt and rescheduled for 10/15 @ 5:00 pm next week.

## 2018-08-29 ENCOUNTER — Ambulatory Visit: Payer: Self-pay

## 2018-08-29 ENCOUNTER — Ambulatory Visit: Payer: Self-pay | Admitting: Licensed Clinical Social Worker

## 2018-08-29 DIAGNOSIS — F331 Major depressive disorder, recurrent, moderate: Secondary | ICD-10-CM

## 2018-08-29 DIAGNOSIS — F4321 Adjustment disorder with depressed mood: Secondary | ICD-10-CM

## 2018-08-29 DIAGNOSIS — Z634 Disappearance and death of family member: Principal | ICD-10-CM

## 2018-08-29 NOTE — Progress Notes (Signed)
Integrated Behavioral Health Follow Up Visit  MRN: [id number] Name: Courtney Stafford   Type of Service: Integrated Behavioral Health- Individual/Family Interpretor:No. Interpretor Name and Language: not applicable.   SUBJECTIVE: Courtney Stafford is a 47 y.o. female accompanied by herself. Patient was referred by endocrinology provider for symptoms of depression and anxiety. Patient reports the following symptoms/concerns: Courtney Stafford reports that she cancelled her last appointment because she was not feeling well. She explains that overall her mood has slightly improved. She notes that she will sometimes sit and cry over reminders of her son. She reports that she has volunteered at least twice at [employer] so far this month. She explains that she has been having a lot of trouble with sleep. She notes that her appetite has improved a little. She denies suicidal and homicidal thoughts.  Duration of problem: Grief at loss of child since the beginning of September. ; Severity of problem: moderate  OBJECTIVE: Mood: Euthymic and Affect: Appropriate Risk of harm to self or others: No plan to harm self or others  LIFE CONTEXT: Family and Social: Courtney Stafford reports that she has still not been able to get in touch with her daughter.  School/Work: Courtney Stafford is back to volunteering at [employer]. Self-Care: Courtney Stafford is trying to stick to a routine and not constantly dwell on things.  Life Changes: Courtney Stafford lost her son unexpectedly at the beginning of September of this year.   GOALS ADDRESSED: Patient will: 1.  Reduce symptoms of: depression and grief.  2.  Increase knowledge and/or ability of: coping skills, healthy habits, self-management skills and stress reduction  3.  Demonstrate ability to: Begin healthy grieving over loss  INTERVENTIONS: Interventions utilized:  Clinician utilized cognitive behavioral therapy by by providing emotional support and encouragement to the patient.  Clinician processed with the patient regarding how she has been doing since the last follow up session. Clinician explained that she is glad to hear that their has been an overall improvement in her mood symptoms. Clinician explained that she will continue to have some good days and bad days. Clinician encouraged the patient to continue to volunteer at [employer] and stick to a routine.  Standardized Assessments completed: Not Needed  ASSESSMENT: Patient currently experiencing symptoms of grief.   Patient may benefit from continuing with therapy.   PLAN: 1. Follow up with behavioral health clinician on : in two weeks. 2. Behavioral recommendations: Clinician will discuss with Dr. Mare Ferrari regarding if psycho tropic medication may be a good option to assist the patient with sleep.  3. Referral(s): Integrated [employer] (In Clinic) 4. "From scale of 1-10, how likely are you to follow plan?": 10  Althia Forts, LCSW

## 2018-09-05 ENCOUNTER — Telehealth: Payer: Self-pay | Admitting: Pharmacist

## 2018-09-05 NOTE — Telephone Encounter (Signed)
09/05/2018 8:47:51 AM - Lantus Vials refill  09/05/18 Printed [employer] refill request for Lantus Vials Inject 20 units daily at bedtime, will take to [facility] for provider to sign.[name]

## 2018-09-12 ENCOUNTER — Ambulatory Visit: Payer: Self-pay | Admitting: Licensed Clinical Social Worker

## 2018-09-12 DIAGNOSIS — F4321 Adjustment disorder with depressed mood: Secondary | ICD-10-CM

## 2018-09-12 DIAGNOSIS — Z634 Disappearance and death of family member: Principal | ICD-10-CM

## 2018-09-12 DIAGNOSIS — F411 Generalized anxiety disorder: Secondary | ICD-10-CM

## 2018-09-12 DIAGNOSIS — F331 Major depressive disorder, recurrent, moderate: Secondary | ICD-10-CM

## 2018-09-12 NOTE — BH Specialist Note (Signed)
Integrated Behavioral Health Follow Up Visit  MRN: [id number] Name: Courtney Stafford  Type of Service: Integrated Behavioral Health- Individual/Family Interpretor:No. Interpretor Name and Language: Not applicable.   SUBJECTIVE: Courtney Stafford is a 46 y.o. female accompanied by herself. Patient was referred by endocrinology clinic for depression and anxiety. Patient reports the following symptoms/concerns: She reports that she has been doing okay. 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 She explains that she almost lost it when she saw a family friend who asked about her son and how she has been doing since he passed away. She explains that she is still trying to get in touch with her daughter to no avail. She notes that she is still having problems sleeping and a difficulty functioning. She notes that she wants to try something for sleep. She notes that she tried to focus on good memories on her son's birthday this past Friday but it was difficult. She notes that her depression is still there but she is not constantly crying like she previously was. She denies suicidal and homicidal thoughts.  Duration of problem: worsened since passing of her son in August ; Severity of problem: moderate  OBJECTIVE: Mood: Euthymic and Affect: Appropriate Risk of harm to self or others: No plan to harm self or others  LIFE CONTEXT: Family and Social: Ms. Stoney has been trying to get in touch with her daughter through every form of communication available.  School/Work: Ms. Khanna is unemployed and receives assistance from [employer] for rent and utilities.  Self-Care: Ms. Dabney is trying to keep herself busy. Life Changes: Ms. Macfadden is having a difficulty sleeping.   GOALS ADDRESSED: Patient will: 1.  Reduce symptoms of: anxiety and depression  2.  Increase knowledge and/or ability of: stress reduction  3.  Demonstrate ability to: Increase healthy adjustment to  current life circumstances  INTERVENTIONS: Interventions utilized:  Brief CBT was utilized by the clinician by providing emotional support and encouragement to the patient. Clinician processed with the patient regarding how she has been doing since the last follow up session. Clinician discussed with the patient that the grief doesn't get easier but you get used to the fact that your loved one is no longer around and that people you haven't seen in awhile will ask you about it if you run into them. Clinician explained that she thinks she should her daughter one last message saying that she loves her, if she has done something to upset her that she apologizes, and that she will wait till she is ready to talk to her. Clinician explained that grief can make some people react differently. Clinician explained that she will speak with Dr. Mare Ferrari for a case consultation tomorrow and discuss with him about the option of taking medications to aid with her sleep, depression, and anxiety.  Standardized Assessments completed: GAD-7 and PHQ 9 Anxiety at a 21 and depression at a 14.   ASSESSMENT: Patient currently experiencing symptoms of depression due to the loss of her son and her daughter not answering her phone calls or texts.   Patient may benefit from continuing with therapy and case consultation with Dr. Mare Ferrari.  PLAN: 1. Follow up with behavioral health clinician on : two weeks or earlier if needed. 2. Behavioral recommendations: Practice self care and positive journal.  3. Referral(s): Integrated [employer] (In Clinic) 4. "From scale of 1-10, how likely are you to follow plan?": 10  Althia Forts,  LCSW

## 2018-09-20 ENCOUNTER — Ambulatory Visit: Payer: Self-pay | Admitting: Licensed Clinical Social Worker

## 2018-09-20 DIAGNOSIS — F331 Major depressive disorder, recurrent, moderate: Secondary | ICD-10-CM

## 2018-09-20 DIAGNOSIS — Z634 Disappearance and death of family member: Principal | ICD-10-CM

## 2018-09-20 DIAGNOSIS — F411 Generalized anxiety disorder: Secondary | ICD-10-CM

## 2018-09-20 DIAGNOSIS — F4321 Adjustment disorder with depressed mood: Secondary | ICD-10-CM

## 2018-09-20 NOTE — BH Specialist Note (Addendum)
Integrated Behavioral Health Treatment Planning Team  MRN: [id number] NAME: Courtney Stafford  DATE: 09/20/18   Case Consultation with Dr. Mare Ferrari on 09/13/18. Dr. Mare Ferrari recommended that Ms. Benninger be started on Mirtazapine 15 mg at bedtime, 1/2 tablet for the first week, and increase to full tablet if tolerated after one week. To be prescribed to [facility].    Treatment Team Attendees: [name], LCSW, Teah Alecia Lemming NP, Dr. Mare Ferrari    Presenting Problem/Current Symptoms: Patient has been dealing with grief from the sudden loss of her son. She has not been sleeping well, anxiety, and depression have increased.  Diagnoses: No diagnosis found.    Medical History:  Past Medical History:  Diagnosis Date  . Diabetes mellitus without complication (HCC)   . Hypertension     Labs:  Recent Results (from the past 2160 hour(s))  POCT Glucose (CBG)     Status: Abnormal   Collection Time: 07/04/18  6:29 PM  Result Value Ref Range   POC Glucose 62 (A) 70 - 99 mg/dl    Procedures: Not applicable.  Social History:  Social History   Socioeconomic History  . Marital status: Single    Spouse name: Not on file  . Number of children: Not on file  . Years of education: Not on file  . Highest education level: High school graduate  Occupational History  . Occupation: unemployed  Social Needs  . Financial resource strain: Not very hard  . Food insecurity:    Worry: Sometimes true    Inability: Sometimes true  . Transportation needs:    Medical: Yes    Non-medical: Yes  Tobacco Use  . Smoking status: Current Some Day Smoker    Packs/day: 0.10  . Smokeless tobacco: Former Engineer, water and Sexual Activity  . Alcohol use: No  . Drug use: No  . Sexual activity: Not on file  Lifestyle  . Physical activity:    Days per week: 0 days    Minutes per session: 0 min  . Stress: Rather much  Relationships  . Social connections:    Talks on phone: Three  times a week    Gets together: Never    Attends religious service: Never    Active member of club or organization: No    Attends meetings of clubs or organizations: Never    Relationship status: Separated  Other Topics Concern  . Not on file  Social History Narrative   Completed biopsychosocial assessment, social determinants screening, administered PHQ 9, and GAD 7.       Transportation is a barrier to appointments. Receives food stamps 192 per month but worries about running out.       Support system is small.    Psychiatric History: is currently in counseling  Strengths/Protective Factors: Motivation to get better.   Goals: Patient will: Increase healthy adjustment to current life circumstances and reduce symptoms of: anxiety depression and also Increase knowledge and/or ability of:  stress reduction  Interventions: Brief CBT  Standardized Assessments Completed: GAD-7 PHQ 9  Medication History: Current medications:  Outpatient Encounter Medications as of 09/20/2018  Medication Sig  . amLODipine (NORVASC) 10 MG tablet Take 1 tablet (10 mg total) by mouth daily.  Marland Kitchen atorvastatin (LIPITOR) 10 MG tablet TAKE ONE TABLET BY MOUTH EVERY DAY  . Blood Glucose Monitoring Suppl Supplies MISC 1 each by Does not apply route 5 (five) times daily.  Marland Kitchen gabapentin (NEURONTIN) 300 MG capsule TAKE 1 CAPSULE BY MOUTH  4 TIMES A DAY  . hydrochlorothiazide (HYDRODIURIL) 25 MG tablet TAKE ONE TABLET BY MOUTH EVERY DAY  . Insulin Glargine (LANTUS) 100 UNIT/ML Solostar Pen Inject 20-25 Units into the skin daily at 10 pm. (Patient taking differently: Inject 20 Units into the skin daily at 10 pm. )  . Insulin Pen Needle (BD PEN NEEDLE NANO U/F) 32G X 4 MM MISC 5 times daily  . lisinopril (PRINIVIL,ZESTRIL) 40 MG tablet Take 1 tablet (40 mg total) by mouth daily.  Marland Kitchen NOVOLOG FLEXPEN 100 UNIT/ML FlexPen ADJUST UNITS BASED ON CARB INTAKE UP TO 20 UNITS PER DOSE. MAX DAILY DOSE IS 60 UNITS.   No  facility-administered encounter medications on file as of 09/20/2018.     Psychotropic Medication Management:   1. Medication: Mirtazapine 15 mg PO QHS. 1/2 tablet for first week then increase to full tablet.   Indication: Insomnia, anxiety, depression, and grief.   Date started: To be prescribed  Date(s) changed: 09/13/18       Team Recommendations: See above.  Referral(s): Integrated [facility] (In Clinic)   Follow up Appointments: in two weeks  Scribe for Treatment Team: Althia Forts, LCSW

## 2018-09-26 ENCOUNTER — Ambulatory Visit: Payer: Self-pay | Admitting: Licensed Clinical Social Worker

## 2018-09-26 DIAGNOSIS — Z634 Disappearance and death of family member: Principal | ICD-10-CM

## 2018-09-26 DIAGNOSIS — F331 Major depressive disorder, recurrent, moderate: Secondary | ICD-10-CM

## 2018-09-26 DIAGNOSIS — F411 Generalized anxiety disorder: Secondary | ICD-10-CM

## 2018-09-26 DIAGNOSIS — F4321 Adjustment disorder with depressed mood: Secondary | ICD-10-CM

## 2018-09-26 NOTE — BH Specialist Note (Signed)
Integrated Behavioral Health Follow Up Visit  MRN: [id number] Name: Courtney Stafford  Type of Service: Integrated Behavioral Health- Individual/Family Interpretor:No. Interpretor Name and Language: Not applicable.   SUBJECTIVE: Courtney Stafford is a 47 y.o. female accompanied by herself. Patient was referred by provider in endocrinology group for symptoms of depression and anxiety. Patient reports the following symptoms/concerns: She explains that not much has been happening since the last time she was here. She explains that other than the clinic, volunteering at [employer], and the store is the only places that she will go to. She explains that she has her good days and bad days in terms of her mood. She notes that she has been utilizing her journal a little bit and it's helping some. She reports that she is writing in it about every other day. She denies suicidal and homicidal thoughts.  Duration of problem:  Severity of problem: moderate  OBJECTIVE: Mood: Euthymic and Affect: Appropriate Risk of harm to self or others: No plan to harm self or others  LIFE CONTEXT: Family and Social: Courtney Stafford has not been able to get in touch with her daughter for the past three months despite multiple attempts of trying to get in touch with her.  School/Work: Courtney Stafford is unemployed, relies on [employer] program to pay rent and utilities. She is also on food stamps.  Self-Care: Courtney Stafford is still having a difficult time with sleep. Life Changes: Courtney Stafford is working on making some life changes, is open to joining a support group and joining a church that will provide transportation.   GOALS ADDRESSED: Patient will: 1.  Reduce symptoms of: depression and grief.  2.  Increase knowledge and/or ability of: coping skills, healthy habits, self-management skills and stress reduction  3.  Demonstrate ability to: Increase adequate support systems for  patient/family  INTERVENTIONS: Interventions utilized:  Brief CBT was utilized by the clinician focusing on behavioral activation. Clinician processed with the patient regarding how she has been doing since the last follow up session. Clinician explained that she thinks it would be a good idea for her to establish with some kind of support group. Clinician provided the patient with the contact information for [facility] has a support group every Tuesday from 6:00 to 7:30. Clinician encouraged the patient to continue to journal. Standardized Assessments completed: Not Needed  ASSESSMENT: Patient currently experiencing symptoms of grief and depression.   Patient may benefit from continuing with therapy and establishing with a support group.  PLAN: 1. Follow up with behavioral health clinician on : two to three weeks or earlier if needed. 2. Behavioral recommendations: Referral to two [place] support groups in [place]..  3. Referral(s): [facility] (LME/Outside Clinic) Referred to [facility] to establish with a support group. She was referred to [facility] that meets every Tuesday from 6: 00 pm to 7:30 pm at [employer] [place].  4. "From scale of 1-10, how likely are you to follow plan?": 7  Courtney Stafford , LCSW

## 2018-09-28 ENCOUNTER — Other Ambulatory Visit: Payer: Self-pay | Admitting: Adult Health Nurse Practitioner

## 2018-09-28 MED ORDER — MIRTAZAPINE 15 MG PO TABS: 15.0000 mg | ORAL_TABLET | Freq: Every day | ORAL | 1 refills | Status: DC

## 2018-09-29 ENCOUNTER — Other Ambulatory Visit: Payer: Self-pay | Admitting: Internal Medicine

## 2018-09-29 DIAGNOSIS — I10 Essential (primary) hypertension: Secondary | ICD-10-CM

## 2018-10-03 ENCOUNTER — Ambulatory Visit: Payer: Self-pay

## 2018-10-03 VITALS — BP 141/92 | HR 67 | Temp 98.2°F | Ht 59.75 in | Wt 185.3 lb

## 2018-10-03 DIAGNOSIS — I10 Essential (primary) hypertension: Secondary | ICD-10-CM

## 2018-10-03 DIAGNOSIS — E119 Type 2 diabetes mellitus without complications: Secondary | ICD-10-CM

## 2018-10-03 LAB — GLUCOSE, POCT (MANUAL RESULT ENTRY): POC GLUCOSE: 171 mg/dL — AB (ref 70–99)

## 2018-10-03 LAB — POCT GLYCOSYLATED HEMOGLOBIN (HGB A1C): HEMOGLOBIN A1C: 8.2 % — AB (ref 4.0–5.6)

## 2018-10-03 MED ORDER — HYDROCHLOROTHIAZIDE 25 MG PO TABS: 25.0000 mg | ORAL_TABLET | Freq: Every day | ORAL | 3 refills | Status: DC

## 2018-10-03 MED ORDER — BLOOD GLUCOSE MONITORING SUPPL SUPPLIES MISC: 1.0000 | Freq: Every day | 4 refills | Status: DC

## 2018-10-03 MED ORDER — AMLODIPINE BESYLATE 10 MG PO TABS: 10.0000 mg | ORAL_TABLET | Freq: Every day | ORAL | 4 refills | Status: DC

## 2018-10-03 MED ORDER — CYANOCOBALAMIN 500 MCG PO TABS: 500.0000 ug | ORAL_TABLET | Freq: Every day | ORAL | 3 refills | Status: DC

## 2018-10-03 MED ORDER — ATORVASTATIN CALCIUM 10 MG PO TABS: 10.0000 mg | ORAL_TABLET | Freq: Every day | ORAL | 3 refills | Status: DC

## 2018-10-03 NOTE — Patient Instructions (Signed)
DIET Make diet changes. Eliminate chips, juice, soda,  -Bread alternatives: Terrill MohrSara Lee Delightful Multigrain ([employer]) -Drink alternatives: Diet Ocean Spray juice; Stevia, Truvia (substitute sweetener) to make drinks -Snack alternatives: Plain [place] Yogurt with berries on top, peanut butter with little bit of crackers  BLOOD GLUCOSE READINGS If blood glucose is less than 50, drink about 4oz of orange juice and check blood sugar again.  LANTUS Take 16 units at bedtime.   NOVOLOG If blood sugar is between 90-150, take 3 units of Novolog if eating a meal. If not eating meal, do not take insulin. If blood sugar is between 150-200, take 2 units if eating. If blood sugar is between 200-250, take 7 units if eating.   With food: 90-150 = Take 3 units Novolog 150-200 = Take 5 units Novolog 200-250 = Take 7 units Novolog  Do not take insulin at bedtime.

## 2018-10-03 NOTE — Progress Notes (Unsigned)
Follow up Diabetes/ [facility]     Patient ID: Courtney Stafford, female   DOB: Jun 21, 1971, 47 y.o.   MRN: [id number] Assessment:  Courtney Stafford is a 47 y.o. female who is seen in follow up for Diabetes mellitus without complication (HCC) [E11.9] at the request of No primary care provider on file..  Encounter Diagnoses 1. Diabetes mellitus without complication (HCC)   2. Essential hypertension      Plan:    1. Type 1 Diabetes: HbA1c (10/03/18) of 8.2 (compared to 04/2018 of 8.3). Tolerating medications well. Has symptoms of hypoglycemia that is associated with low fasting bg readings. Lots of bg variation during the day. Complaints of diabetic neuropathy worsening with no alleviation from gabapentin. Foot exam due 04/2019. UMic due 01/2019. Optho due 03/2019 - Vitamin B12 - HbA1c due 10/2018 - Refill amlodipine, atorvastatin, HCTZ, lisinopril - Glucometer strips  Patient Instructions  DIET Make diet changes. Eliminate chips, juice, soda,  -Bread alternatives: Terrill Mohr Delightful Multigrain ([employer]) -Drink alternatives: Diet Ocean Spray juice; Stevia, Truvia (substitute sweetener) to make drinks -Snack alternatives: Plain [place] Yogurt with berries on top, peanut butter with little bit of crackers  BLOOD GLUCOSE READINGS If blood glucose is less than 50, drink about 4oz of orange juice and check blood sugar again.  LANTUS Take 16 units at bedtime.   NOVOLOG If blood sugar is between 90-150, take 3 units of Novolog if eating a meal. If not eating meal, do not take insulin. If blood sugar is between 150-200, take 2 units if eating. If blood sugar is between 200-250, take 7 units if eating.   With food: 90-150 = Take 3 units Novolog 150-200 = Take 5 units Novolog 200-250 = Take 7 units Novolog  Do not take insulin at bedtime.     Subjective:  20u Lantus at night 1x a day. 5-8u Novolog at breakfast, lunch, dinner. SMBG shows AM BG <70 on days throughout the week. If  not eating a whole lot (sandwich or chips) will take 5u. Take 8u with bigger meal. Had a moment of lows with dizziness and shakiness in the past - with passing out approximately 1.5 months. Denies any lows in the past week or so. Appetite has not picked up much. Try to eat - fried chicken, noodles, sandwich. Consistently getting each meal, but sizes will vary. Have been taking gabapentin for a long time, but has not been helping with the sensations int he legs. Legs feel restless and therefore, have difficulty sleeping. Unsure the reason for gabapentin. Reports tingling sensation in the feet have gotten worse recently. Feels like going to the bathroom a lot (every 2-3 seconds) recently. Wake up 2-3 times a night to go the restroom. Has been increasing in number at night. Reports having a wound on the bottom of toes.  Breakfast around 10-11am: Homemade sandwich (ham & cheese, whitebread), juice Beverages: 1/2 bottle diet [address] a day, Juice 8oz 2-3 times a day Lunch: Ramen noodles Snacks: Chips Dinner: Rice, Fried Chicken Little fast food    Review of Systems  Constitutional: Positive for appetite change. Negative for activity change and diaphoresis.  Respiratory: Negative for shortness of breath.   Cardiovascular: Negative for chest pain.  Skin: Negative for pallor, rash and wound.    Courtney Stafford  has a past medical history of Diabetes mellitus without complication (HCC) and Hypertension.  Family History, Social History, current Medications and allergies reviewed and updated in Epic.  Objective:  There were no vitals taken for this visit. Physical Exam  Constitutional: She appears well-developed and well-nourished. No distress.  HENT:  Mouth/Throat: Abnormal dentition.  Cardiovascular: Normal rate, regular rhythm, normal heart sounds and intact distal pulses. Exam reveals no gallop and no friction rub.  No murmur heard. Pulmonary/Chest: Effort normal and breath sounds  normal. No stridor. No respiratory distress. She has no wheezes. She has no rales. She exhibits no tenderness.  Skin: She is not diaphoretic.  Psychiatric: She has a normal mood and affect.        Data : I have personally reviewed pertinent labs and imaging studies, if indicated,  with the patient in clinic today.    Lab Orders     POCT Glucose (CBG)     POCT HgB A1C  HC Readings from Last 3 Encounters:  [facility]    Wt Readings from Last 3 Encounters:  10/03/18 185 lb 4.8 oz (84.1 kg)  07/04/18 176 lb (79.8 kg)  07/04/18 176 lb 3.2 oz (79.9 kg)

## 2018-10-10 ENCOUNTER — Ambulatory Visit: Payer: Self-pay | Admitting: Licensed Clinical Social Worker

## 2018-10-25 ENCOUNTER — Other Ambulatory Visit: Payer: Self-pay

## 2018-10-25 ENCOUNTER — Ambulatory Visit: Payer: Self-pay | Admitting: Licensed Clinical Social Worker

## 2018-10-25 DIAGNOSIS — I10 Essential (primary) hypertension: Secondary | ICD-10-CM

## 2018-10-25 DIAGNOSIS — F331 Major depressive disorder, recurrent, moderate: Secondary | ICD-10-CM

## 2018-10-25 DIAGNOSIS — F4310 Post-traumatic stress disorder, unspecified: Secondary | ICD-10-CM

## 2018-10-25 DIAGNOSIS — Z634 Disappearance and death of family member: Secondary | ICD-10-CM

## 2018-10-25 DIAGNOSIS — F4321 Adjustment disorder with depressed mood: Secondary | ICD-10-CM

## 2018-10-25 DIAGNOSIS — F411 Generalized anxiety disorder: Secondary | ICD-10-CM

## 2018-10-25 MED ORDER — LISINOPRIL 40 MG PO TABS: 40.0000 mg | ORAL_TABLET | Freq: Every day | ORAL | 4 refills | Status: DC

## 2018-10-25 MED ORDER — GABAPENTIN 300 MG PO CAPS: ORAL_CAPSULE | ORAL | 0 refills | Status: DC

## 2018-10-25 NOTE — BH Specialist Note (Signed)
Integrated Behavioral Health Follow Up Visit  MRN: [id number] Name: Courtney Stafford    Type of Service: Integrated Behavioral Health- Individual/Family Interpretor:No. Interpretor Name and Language: Not applicable.   SUBJECTIVE: Courtney Stafford is a 47 y.o. female accompanied by herself.  Patient was referred by diabetes clinic staff for mental health. Patient reports the following symptoms/concerns: She explained that Thanksgiving was uneventful and spent it alone. She reports that she has given up on reaching out to her daughter. She explains that she got to see one of her grandchildren last week when her son's ex girlfriend came to visit her and is handling all the follow up with her son's death. She reports that she found out from her and her uncle that her daughter is not speaking to anyone in the family or that knew her brother. She notes that is stressed about not having enough shampoo, body wash, tooth brush, and toilet paper. She notes that she has her good days and bad days. She denies suicidal and homicidal thoughts.  Duration of problem:  Severity of problem: moderate  OBJECTIVE: Mood: Euthymic and Affect: Appropriate Risk of harm to self or others: No plan to harm self or others  LIFE CONTEXT: Family and Social: Courtney Stafford has not been able to reach her daughter. School/Work: Courtney Stafford is unemployed. Self-Care: Courtney Stafford is trying to take things one day at a time. Life Changes: Loosing her son and her daughter not speaking to her.  GOALS ADDRESSED: Patient will: 1.  Reduce symptoms of: anxiety and depression  2.  Increase knowledge and/or ability of: coping skills  3.  Demonstrate ability to: Increase healthy adjustment to current life circumstances  INTERVENTIONS: Interventions utilized:  Brief CBT and Link to [facility] [employer] was utilized by the clinician during today's session by providing emotional support and encouragement. Clinician processed with the  patient regarding how she has been doing since the last follow up session. Clinician asked the patient if she feels a little bit better to know that her daughter is not only not speaking with her or anyone else that is close to her. Clinician suggested that the patient could write a heart felt letter to her daughter saying she wants to be involved in her life and be able to see her grandson. Clinician explained that she could have the letter sent to where she works if she doesn't know her exact address. Clinician made referrals in [facility] for the patient to get assistance with resources for hygiene products. Clinician provided the patient with the contact information for [employer] to contact them to see where the new food pantry was moved to and if its on the bus line. Clinician encouraged the patient to practice self care and utilize her coping skills.  Standardized Assessments completed: GAD-7 and PHQ 9  Anxiety is at an 18 and depression is at a 14.  ASSESSMENT: Patient currently experiencing symptoms of anxiety and depression due to situational and family stressors.   Patient may benefit from continuing with therapy.  PLAN: 1. Follow up with behavioral health clinician on : two weeks or earlier if needed. 2. Behavioral recommendations: Practice self care. Pension scheme managerContact [employer] to see where the new food pantry is located and if its on the bus line. 3. Referral(s): Community Mental Health Services (LME/Outside Clinic) Referrals put in through [place] [facility] for resources for hygiene products. 4. "From scale of 1-10, how likely are you to follow plan?": 10  Althia FortsHeather B Simpson, LCSW

## 2018-11-13 ENCOUNTER — Telehealth: Payer: Self-pay | Admitting: Pharmacist

## 2018-11-13 NOTE — Telephone Encounter (Signed)
11/13/2018 12:01:36 PM - Novolog Flexpen & tips refill  11/13/18 Printed [employer] refill request for Emerson Electricovolog Flexpen Adjust units based on carb intake up to 20 units per dose (max daily dose 60 units) #5[facility] Candelaria Stagershaplin to sign.Forde RadonAJ

## 2018-11-13 NOTE — Telephone Encounter (Signed)
11/13/2018 1:21:32 PM - Lantus Vials refill  11/13/18 Printed [employer] refill request for Lantus Vials Inject 20 units under the skin daily at bedtime #4-[facility] Candelaria Stagershaplin to sign.Forde RadonAJ

## 2018-11-16 ENCOUNTER — Ambulatory Visit: Payer: Self-pay | Admitting: Licensed Clinical Social Worker

## 2018-11-24 ENCOUNTER — Telehealth: Payer: Self-pay | Admitting: Pharmacist

## 2018-11-24 NOTE — Telephone Encounter (Signed)
11/24/2018 12:00:13 PM - Lantus Vials refill to [facility]  11/24/2018 Faxed [employer] refill request for Lantus Vials Inject 20 units daily at bedtime #4.AJ   11/24/2018 11:59:33 AM - Novolog Flexpen & tips refill to [facility]  11/24/2018 [employer] refill request for [name] Max daily dose 60 units--Adjust units based on carb intake up to 20 units per dose # 5.[name]

## 2018-12-05 ENCOUNTER — Ambulatory Visit: Payer: Self-pay | Admitting: Licensed Clinical Social Worker

## 2018-12-05 ENCOUNTER — Ambulatory Visit: Payer: Self-pay | Admitting: Internal Medicine

## 2018-12-05 VITALS — BP 165/87 | HR 71 | Temp 98.1°F | Ht 60.0 in | Wt 188.9 lb

## 2018-12-05 DIAGNOSIS — E109 Type 1 diabetes mellitus without complications: Secondary | ICD-10-CM

## 2018-12-05 LAB — GLUCOSE, POCT (MANUAL RESULT ENTRY): POC Glucose: 341 mg/dl — AB (ref 70–99)

## 2018-12-05 NOTE — Progress Notes (Signed)
Follow up Diabetes/ [facility]     Patient ID: Courtney Stafford, female   DOB: [birth year], 48 y.o.   MRN: [id number] Assessment:  Courtney Stafford is a 48 y.o. female who is seen in follow up for Type 1 diabetes mellitus without complication (HCC) [E10.9] at the request of No primary care provider on file..  Encounter Diagnoses 1. Type 1 diabetes mellitus without complication (HCC)    Assessment/Plan:    1. Type 1 Diabetes: HbA1c (10/03/18) of 8.2 (compared to 04/2018 of 8.3). Tolerating medications well. Occasionally has symptoms of hypoglycemia (shakes, chills) that improve with intake of orange juice. Denies any syncope or presyncope. Bg readings at home show lots of bg variation during the day with few readings in the morning. Denies any paresthesias in extremities with reports of improvement in symptoms. Not currently taking Vitamin B12, as she cannot afford it and is not provided in medical management clinic. Foot exam due 04/2019. UMic due 01/2019. Optho due 03/2019. HbA1c due 12/2018 - Refill amlodipine, atorvastatin, HCTZ, lisinopril - Refill Glucometer strips - f/u [facility] in 12/2018  2. HTN: Presents with 165/87 today in clinic. Takes amlodipine 10mg  daily, HCTZ 25mg  daily, and lisinopril 40mg  daily. Has limited exercise, with 10-20 min walks 2-3x a week. Has limited agency in selecting groceries/diet - dependent on bus passes and food stamps.  - Unable to change any medications today (patient had to leave to catch bus) - Consider changing HTN med regimen   Orders Placed This Encounter  Procedures  . POCT Glucose (CBG)     Subjective:   Review of Systems  Constitutional: Negative for appetite change.  Eyes: Negative for visual disturbance.  Cardiovascular: Negative for leg swelling.  Endocrine: Negative for polydipsia and polyuria.  Genitourinary: Positive for flank pain. Negative for decreased urine volume, difficulty urinating, dysuria, hematuria and urgency.   Skin: Negative for color change, pallor and rash.  Neurological: Negative for dizziness, syncope and light-headedness.    Courtney Stafford  has a past medical history of Diabetes mellitus without complication (HCC) and Hypertension.  Family History, Social History, current Medications and allergies reviewed and updated in Epic.  Objective:    Blood pressure (!) 165/87, pulse 71, temperature 98.1 F (36.7 C), temperature source Oral, height 5' (1.524 m), weight 188 lb 14.4 oz (85.7 kg). Physical Exam Constitutional:      General: She is not in acute distress.    Appearance: She is not ill-appearing, toxic-appearing or diaphoretic.  HENT:     Head: Normocephalic and atraumatic.     Mouth/Throat:     Dentition: Abnormal dentition.  Eyes:     General: No scleral icterus.       Right eye: No discharge.        Left eye: No discharge.  Cardiovascular:     Rate and Rhythm: Normal rate and regular rhythm.     Heart sounds: Normal heart sounds. No murmur. No friction rub. No gallop.   Pulmonary:     Effort: Pulmonary effort is normal.     Breath sounds: Normal breath sounds. No stridor. No wheezing or rales.  Abdominal:     Tenderness: There is no right CVA tenderness or left CVA tenderness.  Skin:    Coloration: Skin is not jaundiced or pale.  Neurological:     Mental Status: She is alert and oriented to person, place, and time.  Psychiatric:        Mood and Affect: Mood normal.  Data : I have personally reviewed pertinent labs and imaging studies, if indicated,  with the patient in clinic today.    Lab Orders     POCT Glucose (CBG)  HC Readings from Last 3 Encounters:  [facility]    Wt Readings from Last 3 Encounters:  12/05/18 188 lb 14.4 oz (85.7 kg)  10/03/18 185 lb 4.8 oz (84.1 kg)  07/04/18 176 lb (79.8 kg)

## 2018-12-12 ENCOUNTER — Ambulatory Visit: Payer: Self-pay | Admitting: Licensed Clinical Social Worker

## 2018-12-12 DIAGNOSIS — Z634 Disappearance and death of family member: Secondary | ICD-10-CM

## 2018-12-12 DIAGNOSIS — F4310 Post-traumatic stress disorder, unspecified: Secondary | ICD-10-CM

## 2018-12-12 DIAGNOSIS — F411 Generalized anxiety disorder: Secondary | ICD-10-CM

## 2018-12-12 DIAGNOSIS — F4321 Adjustment disorder with depressed mood: Secondary | ICD-10-CM

## 2018-12-12 NOTE — BH Specialist Note (Signed)
Integrated Behavioral Health Follow Up Visit  MRN: [id number] Name: Courtney Stafford    Type of Service: Integrated Behavioral Health- Individual/Family Interpretor:No. Interpretor Name and Language: Not applicable.   SUBJECTIVE: Courtney Stafford is a 48 y.o. female accompanied by herself. Patient was referred by providers in the endocrinology clinic for mental health. Patient reports the following symptoms/concerns: She reports that she has been doing okay. She explains that its been difficult with transportation since the new year. She explains that she is still taking things day by day. She notes that she is still figuring out the letter she wants to write and send to her daughter. She notes that she is still having a hard time sleeping and hasn't noticed a difference in her symptoms of depression since the increase in the Mirtazapine. She denies suicidal and homicidal thoughts.  Duration of problem: ; Severity of problem: moderate  OBJECTIVE: Mood: Euthymic and Affect: Appropriate Risk of harm to self or others: No plan to harm self or others  LIFE CONTEXT: Family and Social: Ms. Muessig is still grieving the loss of her son. School/Work: Ms. Napierkowski relies on [employer] to pay her rent and utilities.  Self-Care: Ms. Roelle is trying to stay away from drama. Life Changes: See above.   GOALS ADDRESSED: Patient will: 1.  Reduce symptoms of: depression  2.  Increase knowledge and/or ability of: stress reduction  3.  Demonstrate ability to: Increase healthy adjustment to current life circumstances  INTERVENTIONS: Interventions utilized:  Brief CBT was utilized by the clinician focusing on grief by providing emotional support and encouragement to the patient. Clinician processed with the patient regarding how she has been doing since her last follow up appointment. Clinician asked if she had heard from the case manager from [facility]. Clinician explained that she would have a  case consultation with Dr. Mare Ferrari to discuss her symptoms of depression and difficulty sleeping. Clinician encouraged the patient to utilize her coping skills.  Standardized Assessments completed: GAD-7 and PHQ 9 Anxiety is at a 15 and depression is at a 14 ASSESSMENT: Patient currently experiencing symptoms of depression due to grief.   Patient may benefit from continuing with therapy  PLAN: 1. Follow up with behavioral health clinician on : 1 month or earlier if needed. 2. Behavioral recommendations: Case consultation with Dr. Mare Ferrari, MD, Psychiatric consultant who recommended that Ms. Brakebill Mirtazapine be increase from 15 mg to 30 mg at bedtime. Hopefully the antidepressant will help with her symptoms of depression, if not then increasing the dosage of Gabapentin may need to be increased before bedtime.  3. Referral(s): Integrated [employer] (In Clinic) 4. "From scale of 1-10, how likely are you to follow plan?": 8  Althia Forts, LCSW

## 2018-12-13 ENCOUNTER — Other Ambulatory Visit: Payer: Self-pay

## 2018-12-13 MED ORDER — MIRTAZAPINE 15 MG PO TABS: 30.0000 mg | ORAL_TABLET | Freq: Every day | ORAL | 0 refills | Status: DC

## 2018-12-14 ENCOUNTER — Other Ambulatory Visit: Payer: Self-pay | Admitting: Internal Medicine

## 2019-01-02 ENCOUNTER — Ambulatory Visit: Payer: Self-pay | Admitting: Endocrinology

## 2019-01-02 ENCOUNTER — Ambulatory Visit: Payer: Self-pay | Admitting: Licensed Clinical Social Worker

## 2019-01-02 VITALS — BP 190/101 | HR 71 | Temp 98.3°F | Ht 60.0 in | Wt 189.3 lb

## 2019-01-02 DIAGNOSIS — Z634 Disappearance and death of family member: Principal | ICD-10-CM

## 2019-01-02 DIAGNOSIS — M7500 Adhesive capsulitis of unspecified shoulder: Secondary | ICD-10-CM

## 2019-01-02 DIAGNOSIS — F411 Generalized anxiety disorder: Secondary | ICD-10-CM

## 2019-01-02 DIAGNOSIS — F4321 Adjustment disorder with depressed mood: Secondary | ICD-10-CM

## 2019-01-02 DIAGNOSIS — F331 Major depressive disorder, recurrent, moderate: Secondary | ICD-10-CM

## 2019-01-02 DIAGNOSIS — E10618 Type 1 diabetes mellitus with other diabetic arthropathy: Secondary | ICD-10-CM

## 2019-01-02 DIAGNOSIS — E109 Type 1 diabetes mellitus without complications: Secondary | ICD-10-CM

## 2019-01-02 LAB — GLUCOSE, POCT (MANUAL RESULT ENTRY): POC Glucose: 211 mg/dl — AB (ref 70–99)

## 2019-01-02 LAB — POCT GLYCOSYLATED HEMOGLOBIN (HGB A1C): Hemoglobin A1C: 10.2 % — AB (ref 4.0–5.6)

## 2019-01-02 MED ORDER — CARVEDILOL 12.5 MG PO TABS: 12.5000 mg | ORAL_TABLET | Freq: Two times a day (BID) | ORAL | 11 refills | Status: DC

## 2019-01-02 NOTE — BH Specialist Note (Signed)
Integrated Behavioral Health Follow Up Visit  MRN: [id number] Name: Courtney Stafford  Type of Service: Integrated Behavioral Health- Individual/Family Interpretor:No. Interpretor Name and Language: Not applicable.  SUBJECTIVE: Courtney Stafford is a 48 y.o. female accompanied by herself. Patient was referred by endocrinology group for mental health. Patient reports the following symptoms/concerns: She reports that not a lot has changed since the last follow up session. She notes that she still has her good days and bad days in terms of her anxiety and depression. She explains that she has been having these shooting pains in her upper arms, shoulders, and neck that is worrying her. She reports that her aunt contacted her who lives in [place] and wants her to stop by to visit her soon. She explains that she hasn't spoken to her mom in ten years and her aunt gave her phone number suggesting that she give her a call. She explains that she is not sure how she feels about reaching out to her mom or if she is ready. She explains that she is still volunteering at [employer]. She denies suicidal and homicidal thoughts.  Duration of problem:; Severity of problem: moderate  OBJECTIVE: Mood: Euthymic and Affect: Appropriate Risk of harm to self or others: No plan to harm self or others  LIFE CONTEXT: Family and Social: Ms. Tobe lost her son several months ago. Her daughter will not speak to her. She recently reconnected with her aunt. School/Work: Ms. Menke does not work and relies on a program through [employer] to cover the cost of housing and utilities. She is on food stamps. Self-Care: See above. Life Changes: See above.   GOALS ADDRESSED: Patient will: 1.  Reduce symptoms of: anxiety and depression  2.  Increase knowledge and/or ability of: self-management skills  3.  Demonstrate ability to: Increase healthy adjustment to current life  circumstances  INTERVENTIONS: Interventions utilized:  Brief CBT was utilized by the clinician focusing on stress management. Clinician processed with the patient regarding how she has been doing since the last follow up session. Clinician suggested that the patient let the provider in clinic know what she is experiencing to address her concerns. Clinician encouraged the patient to take her aunt up on her offer to visit since she lives close by and is on the bus line. Clinician explained that she thinks she could use someone else in her support system to help her through her grief and just have someone to spend time with. Clinician explained that its important that she continue to practice self care, utilize her coping skills, and follow treatment recommendations from providers.  Standardized Assessments completed: GAD-7 and PHQ 9 Anxiety is at a 18 and depression is at a 14. ASSESSMENT: Patient currently experiencing symptoms of anxiety over her health.   Patient may benefit from continuing with therapy.  PLAN: 1. Follow up with behavioral health clinician on : one month or earlier if needed. 2. Behavioral recommendations: Practicing coping skills and relying on her aunt for support.  3. Referral(s): Integrated [employer] (In Clinic) 4. "From scale of 1-10, how likely are you to follow plan?": 8  Althia Forts, LCSW

## 2019-01-09 ENCOUNTER — Other Ambulatory Visit: Payer: Self-pay

## 2019-01-09 MED ORDER — CARVEDILOL 12.5 MG PO TABS: 12.5000 mg | ORAL_TABLET | Freq: Two times a day (BID) | ORAL | 2 refills | Status: DC

## 2019-01-15 NOTE — Progress Notes (Signed)
Follow up Diabetes/ [facility]     Patient ID: Courtney Stafford, female   DOB: 16-Jul-1971, 48 y.o.   MRN: [id number] Assessment:  Courtney Stafford is a 48 y.o. female who is seen in follow up for Type 1 diabetes mellitus without complication (HCC) [E10.9] at the request of No primary care provider on file..   Assessment/Plan:  1. Diabetes mellitus with complications. Currently takes 20u Lantus, 5-7u of Novolog at meals. Reports no changes in diet. Denies any episodes of hypoglycemia. HbA1c POC  of 10.2 in clinic today (01/02/2019) which is up from 8.2 in Nov 2019. Patient provided home bg readings for morning readings, but difficult to assess nighttime sugar levels to change Lantus regimen. - Cont. 20 u Lantus daily - Novolog 7-9u with meals, with smaller doses with smaller meals - Counseled patient on taking home bg 2x/day in morning and night  2. HTN Patient currently taking amlodipine 10mg , HCTZ 25mg , lisinopril 40mg . BP elevated to 190/101 with elevated blood pressures in past visits. Medication regimen shows maximum theraupetic doses for current medications. - Cont. amlodipine 10mg , HCTZ 25mg , lisinopril 40mg  - Add carvedilol 12.5mg  BID  3. L shoulder pain Started 2-3 days ago with pain that radiates from L shoulder down to arm. Tenderness to palpitation on L shoulder. Decreased sensation throughout L arm compared to R arm. 5/5 muscle strength in upper extremities bilaterally. Inability to fully lift L arm with limited ROM in L arm. Most likely frozen shoulder.  - Provided frozen shoulder exercises.  Orders Placed This Encounter  Procedures  . POCT Glucose (CBG)  . POCT HgB A1C     Subjective:  Medication regimen: 5-7 units of Novolog - breakfast, lunch, dinner 20 units of Lantus - at night time 1x a day in the morning - blood glucose  L arm tingles - with pain "20/10" with something on the inside that is poking at it. "It's killing me" Going for the past 3-4 days,  no trauma. No similar pain in the past. Pain is constant. Tingling in the arm. Tingling from the hand all the way up to the shoulder. No pain medications for the arm pain. Nothing to take, as reported by the patient. No changes to gabapentin.  Gabapentin 2x a day - sometimes it works, sometimes it wouldn't. Tingling sensations in feet. 2-3x a day.   No changes in diet. Continues to drink Diet [facility].  Diabetes  Pertinent negatives for diabetes include no chest pain and no fatigue.     Review of Systems  Constitutional: Negative for appetite change, chills, diaphoresis and fatigue.  Respiratory: Negative for shortness of breath.   Cardiovascular: Negative for chest pain.  Gastrointestinal: Negative for blood in stool.  Musculoskeletal: Negative for joint swelling and myalgias.    Courtney Stafford  has a past medical history of Diabetes mellitus without complication (HCC) and Hypertension.  Family History, Social History, current Medications and allergies reviewed and updated in Epic.  Objective:    Blood pressure (!) 190/101, pulse 71, temperature 98.3 F (36.8 C), temperature source Oral, height 5' (1.524 m), weight 189 lb 4.8 oz (85.9 kg), last menstrual period 01/03/2016. Physical Exam HENT:     Head: Normocephalic and atraumatic.  Eyes:     General: No scleral icterus.       Right eye: No discharge.        Left eye: No discharge.     Conjunctiva/sclera: Conjunctivae normal.  Cardiovascular:  Rate and Rhythm: Normal rate and regular rhythm.  Pulmonary:     Effort: Pulmonary effort is normal.     Breath sounds: Normal breath sounds. No stridor. No wheezing or rales.  Musculoskeletal:        General: Tenderness present. No swelling.     Left shoulder: She exhibits decreased range of motion, tenderness and pain. She exhibits no swelling, no effusion, no deformity and normal strength.     Comments: Inability to fully raise L arm.  Neurological:     Mental Status: She  is alert and oriented to person, place, and time.  Psychiatric:        Mood and Affect: Mood normal.        Behavior: Behavior normal.        Lab Orders     POCT Glucose (CBG)     POCT HgB A1C  HC Readings from Last 3 Encounters:  No data found for [facility]    Wt Readings from Last 3 Encounters:  01/02/19 189 lb 4.8 oz (85.9 kg)  12/05/18 188 lb 14.4 oz (85.7 kg)  10/03/18 185 lb 4.8 oz (84.1 kg)

## 2019-01-17 ENCOUNTER — Telehealth: Payer: Self-pay | Admitting: Pharmacist

## 2019-01-17 DIAGNOSIS — E10618 Type 1 diabetes mellitus with other diabetic arthropathy: Secondary | ICD-10-CM | POA: Insufficient documentation

## 2019-01-17 DIAGNOSIS — M7500 Adhesive capsulitis of unspecified shoulder: Secondary | ICD-10-CM

## 2019-01-17 NOTE — Telephone Encounter (Signed)
01/17/2019 10:50:44 AM - Lantus Vials renewal  01/17/2019 Time for patient to renew with [employer] for Lantus Vials Inject 20 units daily at bedtime, #4boxes, Printed application mailing patient her portion to sign & return with poi/support & taxes/[id number], also sending to [facility].[name]

## 2019-01-30 ENCOUNTER — Ambulatory Visit: Payer: Self-pay | Admitting: Licensed Clinical Social Worker

## 2019-01-30 ENCOUNTER — Ambulatory Visit: Payer: Self-pay

## 2019-01-30 ENCOUNTER — Other Ambulatory Visit: Payer: Self-pay

## 2019-01-30 DIAGNOSIS — F4310 Post-traumatic stress disorder, unspecified: Secondary | ICD-10-CM

## 2019-01-30 DIAGNOSIS — F411 Generalized anxiety disorder: Secondary | ICD-10-CM

## 2019-01-30 DIAGNOSIS — F331 Major depressive disorder, recurrent, moderate: Secondary | ICD-10-CM

## 2019-01-30 DIAGNOSIS — Z634 Disappearance and death of family member: Principal | ICD-10-CM

## 2019-01-30 DIAGNOSIS — F4321 Adjustment disorder with depressed mood: Secondary | ICD-10-CM

## 2019-01-30 NOTE — BH Specialist Note (Signed)
Integrated Behavioral Health Follow Up Visit  MRN: [id number] Name: Courtney Stafford  Type of Service: Integrated Behavioral Health- Individual/Family Interpretor:No. Interpretor Name and Language: Not applicable.  SUBJECTIVE: Courtney Stafford is a 48 y.o. female accompanied by herself. Patient was referred by provider in diabetes clinic for mental health. Patient reports the following symptoms/concerns: She reports that she reached out to her mom and had a pleasant phone conversation. She explains that she is still talking to her aunt on the phone and has yet to visit her at her apartment in person. She explains that her anxiety is up because her apartment is having new appliances being installed and painted by someone her land lord hired. She notes that since she has been there in the last seven to eight years that no updates have been made in her apartment. She notes that she was on Facebook and saw her daughter was pregnant so she decided to give her a call. She reports that her daughter answered the phone, confirmed the news, and handed the phone to her three year old grandson to talk to. She explains that she is still hurt and doesn't understand why her daughter is upset with her. She denies suicidal and homicidal thoughts.  Duration of problem:  Severity of problem: severe  OBJECTIVE: Mood: Euthymic and Affect: Appropriate Risk of harm to self or others: No plan to harm self or others  LIFE CONTEXT: Family and Social: See above. School/Work: Ms. Mormann is unemployed and a program through [employer] covers her rent and utilities.  Self-Care: See above. Life Changes: See above.   GOALS ADDRESSED: Patient will: 1.  Reduce symptoms of: anxiety  2.  Increase knowledge and/or ability of: coping skills  3.  Demonstrate ability to: Increase healthy adjustment to current life circumstances  INTERVENTIONS: Interventions utilized:  Brief CBT was utilized by the clinician focusing  on anxiety and affect on the normal cognition. Clinician processed with the patient regarding how she has been doing since the last follow up session. Clinician explained that she is glad that she reached out to her phone and is developing a relationship with her again. Clinician discussed the factors that are contributing the patient's current level of anxiety. Clinician asked the patient if she has ever tried any grounding or relaxation techniques. Clinician discussed the concept of grounding techniques; [phone] game, body awareness, and other distractions techniques. Clinician provided the patient with an overview of relaxation techniques; guided imagery, putting your thoughts on trial, progressive muscle relaxation, and deep breathing. Clinician provided the patient with handouts and material for relaxation techniques and grounding techniques.  Standardized Assessments completed: GAD-7 and PHQ 9  ASSESSMENT: Patient currently experiencing symptoms of anxiety due to situational stress.   Patient may benefit from relaxation techniques and grounding techniques.Marland Kitchen  PLAN: 1. Follow up with behavioral health clinician on : 1 month or earlier if needed. 2. Behavioral recommendations: See above.  3. Referral(s): Integrated [employer] (In Clinic) 4. "From scale of 1-10, how likely are you to follow plan?": 9  Althia Forts, LCSW

## 2019-01-31 ENCOUNTER — Telehealth: Payer: Self-pay | Admitting: Pharmacy Technician

## 2019-01-31 ENCOUNTER — Other Ambulatory Visit: Payer: Self-pay

## 2019-01-31 MED ORDER — MIRTAZAPINE 30 MG PO TABS: 30.0000 mg | ORAL_TABLET | Freq: Every day | ORAL | 0 refills | Status: DC

## 2019-01-31 NOTE — Telephone Encounter (Signed)
Received 2020 proof of income.  Patient eligible to receive medication assistance at [facility] as long as eligibility requirements continue to be met.  [facility]

## 2019-02-01 ENCOUNTER — Telehealth: Payer: Self-pay | Admitting: Pharmacist

## 2019-02-01 NOTE — Telephone Encounter (Signed)
02/01/2019 8:53:48 AM - Lantus pending  02/01/2019 I have received the patient portion of [employer] application back, holding to receive the provider portion back-to [facility] 01/17/2019.Courtney Stafford

## 2019-02-02 ENCOUNTER — Telehealth: Payer: Self-pay | Admitting: Pharmacist

## 2019-02-02 NOTE — Telephone Encounter (Signed)
02/02/2019 10:42:47 AM - Lantus Vials  02/02/2019 Faxed [employer] application for Lantus Vials Inject 20 units daily at bedtime # 4 boxes.[name]

## 2019-02-20 ENCOUNTER — Other Ambulatory Visit: Payer: Self-pay | Admitting: Internal Medicine

## 2019-02-20 DIAGNOSIS — I10 Essential (primary) hypertension: Secondary | ICD-10-CM

## 2019-03-06 ENCOUNTER — Other Ambulatory Visit: Payer: Self-pay

## 2019-03-06 ENCOUNTER — Ambulatory Visit: Payer: Self-pay

## 2019-03-06 ENCOUNTER — Ambulatory Visit: Payer: Self-pay | Admitting: Licensed Clinical Social Worker

## 2019-03-06 DIAGNOSIS — Z634 Disappearance and death of family member: Principal | ICD-10-CM

## 2019-03-06 DIAGNOSIS — F331 Major depressive disorder, recurrent, moderate: Secondary | ICD-10-CM

## 2019-03-06 DIAGNOSIS — F411 Generalized anxiety disorder: Secondary | ICD-10-CM

## 2019-03-06 DIAGNOSIS — F4321 Adjustment disorder with depressed mood: Secondary | ICD-10-CM

## 2019-03-06 NOTE — BH Specialist Note (Signed)
Integrated Behavioral Health Follow Up Phone Visit  MRN: [id number] Name: Courtney Stafford  Type of Service: Integrated Behavioral Health- Individual/Family Interpretor:No. Interpretor Name and Language: Not applicable.  SUBJECTIVE: Courtney Stafford is a 48 y.o. female accompanied by herself. Patient was referred by Dr. Terance Hart MD for mental health. Patient reports the following symptoms/concerns: She reports that she has noticed a difference in her sleep since she started the 30 mg of the Mirtazapine. She describes feeling down and depressed a few times a week. She explains that she still has her good days and bad days in terms of the grief. She expressed concern with everything happening with the pandemic and not being able to leave her apartment. She explains that she saw a commercial on TV that caused her to feel anxious because it stated that people with diabetes and certain chronic health conditions will get the virus if they leave the house. She denies sucidal and homicidal thoughts.  Duration of problem: ; Severity of problem: moderate  OBJECTIVE: Mood: Euthymic and Affect: Appropriate Risk of harm to self or others: No plan to harm self or others  LIFE CONTEXT: Family and Social: [name] reports that she hasn't really reached out to anyone in the last month. School/Work: [name] is apart of a program through [employer] that pays her utilities and rent. Self-Care: see above. Life Changes: see above.  GOALS ADDRESSED: Patient will: 1.  Reduce symptoms of: anxiety  2.  Increase knowledge and/or ability of: coping skills, self-management skills and stress reduction  3.  Demonstrate ability to: Increase healthy adjustment to current life circumstances  INTERVENTIONS: Interventions utilized:  Brief CBT was utilized the clinician focusing on the patient's anxiety and affect on normal cognition. Clinician processed with the patient regarding how she has been doing  since the last follow up session. Clinician explained that she is glad to hear that she is able to sleep at night and has noticed a slight decrease in her symptoms of depression. Clinician provided psycho education about COVID 63 explaining that people who have chronic health conditions and compromised immune symptoms are more susceptible to contracting the virus. Clinician explained to the patient that if she is practicing proper hand washing, wearing a mask, social distancing, and only leaving the house when necessary that she is taking the precautions to limit her chances of contracting the virus. Clinician explained that the commercial she saw on TV was an advisory to keep her and other people with similar health conditions safe. Clinician encouraged the patient to continue to practice self care and utilize her coping skills. Standardized Assessments completed: GAD-7 and PHQ 9  ASSESSMENT: Patient currently experiencing symptoms of anxiety due to the pandemic.   Patient may benefit from continuing with therapy.  PLAN: 1. Follow up with behavioral health clinician on : follow up in two weeks for a phone visit. 2. Behavioral recommendations: see above. 3. Referral(s): Integrated [employer] (In Clinic) 4. "From scale of 1-10, how likely are you to follow plan?": 8  Althia Forts, LCSW

## 2019-03-08 ENCOUNTER — Other Ambulatory Visit: Payer: Self-pay

## 2019-03-08 ENCOUNTER — Ambulatory Visit: Payer: Self-pay | Admitting: Urology

## 2019-03-08 ENCOUNTER — Ambulatory Visit: Payer: Self-pay | Admitting: Ophthalmology

## 2019-03-08 DIAGNOSIS — E119 Type 2 diabetes mellitus without complications: Secondary | ICD-10-CM

## 2019-03-08 NOTE — Progress Notes (Signed)
Virtual Visit via Telephone Note  I connected with Courtney Stafford on 03/08/2019 at 1829 by telephone and verified that I am speaking with the correct person using two identifiers.  They are located at home.  I am located at my home.    This visit type was conducted due to national recommendations for restrictions regarding the COVID-19 Pandemic (e.g. social distancing).  This format is felt to be most appropriate for this patient at this time.  All issues noted in this document were discussed and addressed.  No physical exam was performed.   I discussed the limitations, risks, security and privacy concerns of performing an evaluation and management service by telephone and the availability of in person appointments. I also discussed with the patient that there may be a patient responsible charge related to this service. The patient expressed understanding and agreed to proceed.   History of Present Illness: DM - BS at home:  Fasting am sugars 112-120's,  after dinner120-140, evening ~ 241   Highest over the last two weeks 341 currently on Lantus 20U, 7-9u Novolog at meals.   BS decreases to 70 - 130 when she increases the Novolog to 9u   Observations/Objective:   Assessment and Plan:  1. DM Continue Lantus 20u nightly and Novolog 7-9u with meals  2. Uncontrolled HTN Will pick up BP reader and will check BP when she checks her sugars Will review next week.    Follow Up Instructions:    I discussed the assessment and treatment plan with the patient. The patient was provided an opportunity to ask questions and all were answered. The patient agreed with the plan and demonstrated an understanding of the instructions.   The patient was advised to call back or seek an in-person evaluation if the symptoms worsen or if the condition fails to improve as anticipated.  I provided 10 minutes of non-face-to-face time during this encounter.   Ramin Zoll, PA-C

## 2019-03-12 ENCOUNTER — Telehealth: Payer: Self-pay | Admitting: Pharmacist

## 2019-03-12 NOTE — Telephone Encounter (Signed)
03/12/2019 11:41:47 AM - Novolog Flexpen & tips renewal  03/12/2019 Time for patient to re enroll for Novolog Flexpen & tips, printed application--mailing patient her portion to sign & return, sending provider portion to [facility] for Dr. Candelaria Stagers to sign.[name]

## 2019-03-14 ENCOUNTER — Telehealth: Payer: Self-pay | Admitting: Pharmacist

## 2019-03-14 NOTE — Telephone Encounter (Signed)
03/14/2019 3:03:01 PM - Lantus Vials refill to [facility]  03/14/2019 Time for refill on Lantus Vials Inject 20 units daily at bedtime #4, printed [employer] refill request and page 4 of application--per Lorrie @ [facility] changing provider from [place] to [place] during this COVID time. Will take to [facility] for Lanora Manis to sign for this refill till [facility] comes back.[name]

## 2019-03-15 ENCOUNTER — Other Ambulatory Visit: Payer: Self-pay

## 2019-03-15 ENCOUNTER — Encounter: Payer: Self-pay | Admitting: Urology

## 2019-03-15 ENCOUNTER — Ambulatory Visit: Payer: Self-pay | Admitting: Urology

## 2019-03-15 VITALS — BP 143/98

## 2019-03-15 DIAGNOSIS — I10 Essential (primary) hypertension: Secondary | ICD-10-CM

## 2019-03-15 DIAGNOSIS — E119 Type 2 diabetes mellitus without complications: Secondary | ICD-10-CM

## 2019-03-15 MED ORDER — HYDROCHLOROTHIAZIDE 25 MG PO TABS: 25.0000 mg | ORAL_TABLET | Freq: Every day | ORAL | 0 refills | Status: DC

## 2019-03-15 NOTE — Progress Notes (Signed)
Virtual Visit via Telephone Note  I connected with on Courtney Stafford on 03/15/2019 at 1744  by telephone and verified that I am speaking with the correct person using two identifiers.  They are located at home.  I am located at my home.    This visit type was conducted due to national recommendations for restrictions regarding the COVID-19 Pandemic (e.g. social distancing).  This format is felt to be most appropriate for this patient at this time.  All issues noted in this document were discussed and addressed.  No physical exam was performed.   I discussed the limitations, risks, security and privacy concerns of performing an evaluation and management service by telephone and the availability of in person appointments. I also discussed with the patient that there may be a patient responsible charge related to this service. The patient expressed understanding and agreed to proceed.   History of Present Illness: BP readings over the last few days 132/93, 134/98,  130/91, 144/85.  She was only taking the Coreg once daily instead of the twice daily she was prescribed.    BS readings 166 at 11am which was fasting at 4 pm 145.  Yesterday, 115 at 11am, at 4 pm 180 and at 8 pm 239.       Observations/Objective: Does not sound distressed on the phone.    Assessment and Plan:  1. HTN - will start taking Coreg 12.5 mg BID - refilled HCTZ   2. DM - increase Lantus to 25 u at night  - continue to monitor BS at home and bring log book to next appointment   Follow Up Instructions:  Courtney Stafford will increase her Lantus to 25 u at night and keep her follow up appointment in May with the endocrinology clinc.     I discussed the assessment and treatment plan with the patient. The patient was provided an opportunity to ask questions and all were answered. The patient agreed with the plan and demonstrated an understanding of the instructions.   The patient was advised to call back or seek an  in-person evaluation if the symptoms worsen or if the condition fails to improve as anticipated.  I provided 20 minutes of non-face-to-face time during this encounter.   Courtney Filosa, PA-C

## 2019-03-20 ENCOUNTER — Other Ambulatory Visit: Payer: Self-pay

## 2019-03-20 ENCOUNTER — Ambulatory Visit: Payer: Self-pay | Admitting: Licensed Clinical Social Worker

## 2019-03-20 DIAGNOSIS — F4310 Post-traumatic stress disorder, unspecified: Secondary | ICD-10-CM

## 2019-03-20 DIAGNOSIS — F331 Major depressive disorder, recurrent, moderate: Secondary | ICD-10-CM

## 2019-03-20 NOTE — BH Specialist Note (Signed)
Integrated Behavioral Health Follow Up Phone Visit  MRN: [id number] Name: Courtney Stafford  Type of Service: Integrated Behavioral Health- Individual/Family Interpretor:No. Interpretor Name and Language: Not applicable.  SUBJECTIVE: Courtney Stafford is a 47 y.o. female accompanied by herself. Patient was referred by Endo group at [facility] for mental health. Patient reports the following symptoms/concerns: She reports that she has been having a difficult time today because its her daughter's birthday and she hasn't been answering her phone. She explains that she has been feeling down and depressed a little this week. She notes that she is trying to not think too much about her daughter not speaking with her and hoping she comes around one day. She explains that she is trying to keep busy around the house but there isn't much to do. She reports that she is trying to utilize her coping skills. She denies suicidal and homicidal thoughts.  Duration of problem: ; Severity of problem: moderate  OBJECTIVE: Mood: Depressed and Affect: Not assessable via phone. Risk of harm to self or others: No plan to harm self or others  LIFE CONTEXT: Family and Social: see above. School/Work:  Self-Care: see above. Life Changes: see above.  GOALS ADDRESSED: Patient will: 1.  Reduce symptoms of: depression  2.  Increase knowledge and/or ability of: coping skills  3.  Demonstrate ability to: Increase healthy adjustment to current life circumstances  INTERVENTIONS: Interventions utilized:  Brief CBT was utilized by the clinician focusing on the patient's depression by providing emotional support and encouragement to the patient. Clinician processed with the patient regarding how she has been doing since the last follow up session. Clinician explained that maybe she could reach out to her daughter's husband on facebook to get their address so she could write her a letter or let him know that she misses  her daughter. Clinician explained that she thinks allowing her daughter to come around is the right thing to do. Clinician explained to the patient that its important during this time to focus on gratitude for what she has versus what she doesn't have, that she is safe, some what healthy, and most of her needs are taken care of. Clinician asked the patient how she is occupying her time. Clinician encouraged the patient to continue to utilize the coping skills discussed at previous follow up sessions.  Standardized Assessments completed: GAD-7 and PHQ 9  ASSESSMENT: Patient currently experiencing see above.   Patient may benefit from see above.  PLAN: 1. Follow up with behavioral health clinician on : two weeks or earlier if needed. 2. Behavioral recommendations:see above. 3. Referral(s): Integrated [employer] (In Clinic) 4. "From scale of 1-10, how likely are you to follow plan?": 8  Althia Forts, LCSW

## 2019-03-30 ENCOUNTER — Telehealth: Payer: Self-pay | Admitting: Pharmacist

## 2019-03-30 NOTE — Telephone Encounter (Signed)
03/30/2019 12:04:26 PM - Novolog Flexpen & tips Renewal  03/30/2019 I have faxed Novo Nordisk renewal application for [name] Max daily dose 60 units (to replace vials) Adjust units based on carb intake up to 20 units per dose, also for Novo fine 32G tips to use daily with Flexpen. This was also a provider change from [place] to [place] due to COVID.[name]

## 2019-04-03 ENCOUNTER — Ambulatory Visit: Payer: Self-pay

## 2019-04-03 ENCOUNTER — Other Ambulatory Visit: Payer: Self-pay

## 2019-04-03 ENCOUNTER — Ambulatory Visit: Payer: Self-pay | Admitting: Endocrinology

## 2019-04-03 ENCOUNTER — Ambulatory Visit: Payer: Self-pay | Admitting: Licensed Clinical Social Worker

## 2019-04-03 DIAGNOSIS — F331 Major depressive disorder, recurrent, moderate: Secondary | ICD-10-CM

## 2019-04-03 DIAGNOSIS — F4310 Post-traumatic stress disorder, unspecified: Secondary | ICD-10-CM

## 2019-04-03 DIAGNOSIS — E109 Type 1 diabetes mellitus without complications: Secondary | ICD-10-CM

## 2019-04-03 DIAGNOSIS — F411 Generalized anxiety disorder: Secondary | ICD-10-CM

## 2019-04-03 NOTE — Progress Notes (Signed)
Follow up Diabetes/ Endocrine [facility]     Patient ID: Courtney Stafford, female   DOB: Jan 29, 1971, 48 y.o.   MRN: [id number] Assessment:  Courtney Stafford is a 48 y.o. female who is seen in follow up for T1DM. She is currently involved in primary care with the [facility] and is consulting with [facility] Endocrinology this evening via telehealth.  Plan:     1. Pt instructed to go to [facility] to receive more glucose testing strips.  2. We do not want to increase Lantus due to recent hypoglycemic episode in the morning. Instead, we recommend that she increase her Novolog dose to 10u with a "normal" meal and 6u with a "small" meal.     Subjective:  HPI  Pt has a 34 year history of T1DM. HbA1c was 10.2% on 01/02/2019, increased from 8.2% on 10/03/2018.  Pt instructed to increase Lantus to 25u/night during 03/15/2019 visit at [facility]. Continuing to take Novolog 5-9u with meals.   Nervous about blood pressure, got a cuff recently and it has been 130s-160s/high 90s.   Blood sugars: 150s-300s (checks at 11a, 5p, pre-bed), typically 140s-170s when fasting in the morning. 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  Review of Systems  Endorses significant polyuria and polydipsia Endorses numbness and tingling in hands and feet  REMEE Stafford  has a past medical history of Diabetes mellitus without complication (HCC) and Hypertension.  Family History, Social History, current Medications and allergies reviewed and updated in Epic.  Current Outpatient Medications on File Prior to Visit  Medication Sig Dispense Refill  . amLODipine (NORVASC) 10 MG tablet Take 1 tablet (10 mg total) by mouth daily. 90 tablet 4  . atorvastatin (LIPITOR) 10 MG tablet Take 1 tablet (10 mg total) by mouth  daily. 30 tablet 3  . Blood Glucose Monitoring Suppl Supplies MISC 1 each by Does not apply route 5 (five) times daily. 500 each 4  . carvedilol (COREG) 12.5 MG tablet Take 1 tablet (12.5 mg total) by mouth 2 (two) times daily with a meal. 60 tablet 2  . gabapentin (NEURONTIN) 300 MG capsule TAKE 1 CAPSULE BY MOUTH 4 TIMES A DAY 120 capsule 0  . hydrochlorothiazide (HYDRODIURIL) 25 MG tablet Take 1 tablet (25 mg total) by mouth daily. 30 tablet 0  . Insulin Glargine (LANTUS) 100 UNIT/ML Solostar Pen Inject 20-25 Units into the skin daily at 10 pm. (Patient taking differently: Inject 20 Units into the skin daily at 10 pm. ) 30 mL 4  . Insulin Pen Needle (BD PEN NEEDLE NANO U/F) 32G X 4 MM MISC 5 times daily 200 each 12  . lisinopril (PRINIVIL,ZESTRIL) 40 MG tablet Take 1 tablet (40 mg total) by mouth daily. 90 tablet 4  . mirtazapine (REMERON) 30 MG tablet Take 1 tablet (30 mg total) by mouth at bedtime. 1/2 tablet for the first 7 days then increase to 1 whole tablet. 90 tablet 0  . NOVOLOG FLEXPEN 100 UNIT/ML FlexPen ADJUST UNITS BASED ON CARB INTAKE UP TO 20 UNITS PER DOSE. MAX DAILY DOSE IS 60 UNITS. (Patient taking differently: 5-8 Units 3 (three) times daily with meals. ) 75 mL 0  . vitamin B-12 (CYANOCOBALAMIN) 500 MCG tablet Take 1 tablet (500 mcg total) by mouth daily. (  Patient not taking: Reported on 12/05/2018) 30 tablet 3   No current facility-administered medications on file prior to visit.       Objective:     There were no vitals taken for this visit.  Physical Exam Pt alerted and oriented with normal affect and mood     Data : I have personally reviewed pertinent labs and imaging studies, if indicated,  with the patient in clinic today.   Lab Orders  No laboratory test(s) ordered today    HC Readings from Last 3 Encounters:  [facility]    Wt Readings from Last 3 Encounters:  01/02/19 189 lb 4.8 oz (85.9 kg)  12/05/18 188 lb 14.4 oz (85.7 kg)  10/03/18 185  lb 4.8 oz (84.1 kg)

## 2019-04-03 NOTE — BH Specialist Note (Signed)
Integrated Behavioral Health Follow Up Visit via phone  MRN: [id number] Name: Courtney Stafford  Type of Service: Integrated Behavioral Health- Individual/Family Interpretor:No. Interpretor Name and Language: Not applicable.  SUBJECTIVE: Courtney Stafford is a 48 y.o. female accompanied by herself. Patient was referred by Endocrinology group for mental health. Patient reports the following symptoms/concerns: She reports that she has been doing okay since the last follow up session. She notes that she was surprised to receive a happy mother's day text from her daughter and tried calling her on the phone but she didn't answer. She explains that she has been in a little bit of a funk due to being stuck inside at home all the time. She explains that she misses volunteering at [employer] and wishes she could come to the clinic to be seen in person. She explains that she has tried to distance herself from one of her friends who is causing her some problems. She denies suicidal and homicidal thoughts.  Duration of problem: ; Severity of problem: moderate  OBJECTIVE: Mood: Euthymic and Affect: Appropriate Risk of harm to self or others: No plan to harm self or others  LIFE CONTEXT: Family and Social:see above. School/Work: see above. Self-Care: see above. Life Changes: see above.  GOALS ADDRESSED: Patient will: 1.  Reduce symptoms of: anxiety  2.  Increase knowledge and/or ability of: coping skills and healthy habits  3.  Demonstrate ability to: Increase healthy adjustment to current life circumstances  INTERVENTIONS: Interventions utilized:  Brief CBT was utilized by the clinician focusing on the patient's anxiety and affect on normal cognition. Clinician processed with the patient regarding how she has been doing since the last follow up session. Clinician explained that it sounds like her daughter has been in a mood but obviously their relationship is not completely ruined if she reached out  to her on mother's day. Clinician utilized reflective listening encouraging the patient to ventilate her frustration towards how COVID 19 has affected her day to day. Clinician encouraged the patient to continue to utilize her coping skills. Standardized Assessments completed: GAD-7 and PHQ 9  ASSESSMENT: Patient currently experiencing see above.   Patient may benefit from see above.  PLAN: 1. Follow up with behavioral health clinician on : two weeks or earlier if needed. 2. Behavioral recommendations: see above. 3. Referral(s): Integrated [employer] (In Clinic) 4. "From scale of 1-10, how likely are you to follow plan?":   Althia Forts, LCSW

## 2019-04-12 ENCOUNTER — Telehealth: Payer: Self-pay | Admitting: Pharmacist

## 2019-04-12 NOTE — Telephone Encounter (Signed)
04/12/2019 10:06:15 AM - Lantus refill & change in provider  04/12/2019 Faxed [employer] refill request for Lantus  Vials Inject 20 units daily at bedtime #4, along with page 1 of application due to COVID, change in provider to [place] from [place].[name]

## 2019-04-17 ENCOUNTER — Other Ambulatory Visit: Payer: Self-pay

## 2019-04-17 ENCOUNTER — Ambulatory Visit: Payer: Self-pay | Admitting: Licensed Clinical Social Worker

## 2019-04-17 DIAGNOSIS — F411 Generalized anxiety disorder: Secondary | ICD-10-CM

## 2019-04-17 DIAGNOSIS — F331 Major depressive disorder, recurrent, moderate: Secondary | ICD-10-CM

## 2019-04-17 DIAGNOSIS — F4321 Adjustment disorder with depressed mood: Secondary | ICD-10-CM

## 2019-04-17 DIAGNOSIS — F4310 Post-traumatic stress disorder, unspecified: Secondary | ICD-10-CM

## 2019-04-17 NOTE — BH Specialist Note (Signed)
Integrated Behavioral Health Follow Up Visit Via Phone.  MRN: [id number] Name: Courtney Stafford  Type of Service: Integrated Behavioral Health- Individual/Family Interpretor:No. Interpretor Name and Language: Not applicable.  SUBJECTIVE: Courtney Stafford is a 48 y.o. female accompanied by herself. Patient was referred by endocrinology group  for mental health. Patient reports the following symptoms/concerns: She reports that she has been doing okay since the last follow up session. She notes that her daughter has been sending her texts through Facebook but would rather talk to her on the phone. She reports that [facility] and has an appointment with them on July 9th @ 2:30 pm to get food. She notes that when she called to schedule the appointment, she forgot to ask if they provide hygiene products and clothing. She reports that she contacted [employer] hoping that they would re open soon and was told it could be awhile before they do. She explains that she is a little bummed about other than going to the store, not really getting to go anywhere. She reports that misses volunteering at [employer] and used to rent movies for free. She denies suicidal and homicidal thoughts.  Duration of problem: ; Severity of problem: moderate  OBJECTIVE: Mood: Euthymic and Affect: Appropriate Risk of harm to self or others: No plan to harm self or others  LIFE CONTEXT: Family and Social: see above. School/Work: see above. Self-Care: see above. Life Changes: see above.  GOALS ADDRESSED: Patient will: 1.  Reduce symptoms of: depression  2.  Increase knowledge and/or ability of: coping skills, healthy habits, self-management skills and stress reduction  3.  Demonstrate ability to: Increase healthy adjustment to current life circumstances  INTERVENTIONS: Interventions utilized:  Brief CBT was utilized by the clinician focusing on the patient's mood and affect on her behavior.  Clinician processed with the patient regarding how she has been doing since the last follow up session. Clinician encouraged the patient to at least focus on the positive that her daughter is communicating with her at all, even if its not the way that she prefers. Clinician asked the patient if she has followed up with any of the organizations for resources to get hygiene products and clothing. Clinician suggested that the patient reach out to [employer] for assistance due getting her rent and utilities covered through them, that they could possibly assist her with the rest of the things that she needs but she will have to call and ask to find out. Clinician explained to the patient that it's important to look on the positive rather than negative of things and try to find things for enjoyment.  Standardized Assessments completed: GAD-7 and PHQ 9  ASSESSMENT: Patient currently experiencing see above.   Patient may benefit from see above.  PLAN: 1. Follow up with behavioral health clinician on : two weeks or earlier if needed. 2. Behavioral recommendations: see above. 3. Referral(s): Integrated [facility] (In Clinic) 4. "From scale of 1-10, how likely are you to follow plan?":   Althia Forts, LCSW

## 2019-04-26 ENCOUNTER — Telehealth: Payer: Self-pay | Admitting: Urology

## 2019-04-26 ENCOUNTER — Encounter: Payer: Self-pay | Admitting: Urology

## 2019-04-26 ENCOUNTER — Ambulatory Visit: Payer: Self-pay | Admitting: Urology

## 2019-04-26 ENCOUNTER — Other Ambulatory Visit: Payer: Self-pay

## 2019-04-26 DIAGNOSIS — E119 Type 2 diabetes mellitus without complications: Secondary | ICD-10-CM

## 2019-04-26 MED ORDER — GABAPENTIN 300 MG PO CAPS: ORAL_CAPSULE | ORAL | 0 refills | Status: DC

## 2019-04-26 NOTE — Telephone Encounter (Signed)
Please schedule Courtney Stafford an appointment in July with a lab visit prior.  Please have her bring her blood sugar log, so that we may review it.

## 2019-04-26 NOTE — Progress Notes (Signed)
Virtual Visit via Telephone Note  I connected with Courtney Stafford on 04/26/2019 at   by telephone and verified that I am speaking with the correct person using two identifiers.  They are located at home.  I am located at my home.    This visit type was conducted due to national recommendations for restrictions regarding the COVID-19 Pandemic (e.g. social distancing).  This format is felt to be most appropriate for this patient at this time.  All issues noted in this document were discussed and addressed.  No physical exam was performed.   I discussed the limitations, risks, security and privacy concerns of performing an evaluation and management service by telephone and the availability of in person appointments. I also discussed with the patient that there may be a patient responsible charge related to this service. The patient expressed understanding and agreed to proceed.   History of Present Illness: DM  Blood sugars:  150's-300's (checks at 11a, 5p, pre-bed), 70's-120's when fasting in the am  Diet:  Heavy in carbs - gets food from convenience stores due to transportation issues    Observations/Objective: [name] does not sound distressed and is answering questions appropriately   Assessment and Plan: 1. Uncontrolled DM  Continue on present medications  RTC in July for blood work and to review blood sugar log  Follow Up Instructions:  RTC in July    I discussed the assessment and treatment plan with the patient. The patient was provided an opportunity to ask questions and all were answered. The patient agreed with the plan and demonstrated an understanding of the instructions.   The patient was advised to call back or seek an in-person evaluation if the symptoms worsen or if the condition fails to improve as anticipated.  I provided 15 minutes of non-face-to-face time during this encounter.   Romie Keeble, PA-C

## 2019-05-01 ENCOUNTER — Other Ambulatory Visit: Payer: Self-pay

## 2019-05-01 ENCOUNTER — Ambulatory Visit: Payer: Self-pay | Admitting: Licensed Clinical Social Worker

## 2019-05-01 DIAGNOSIS — F4310 Post-traumatic stress disorder, unspecified: Secondary | ICD-10-CM

## 2019-05-01 DIAGNOSIS — F331 Major depressive disorder, recurrent, moderate: Secondary | ICD-10-CM

## 2019-05-01 DIAGNOSIS — F411 Generalized anxiety disorder: Secondary | ICD-10-CM

## 2019-05-01 NOTE — BH Specialist Note (Signed)
Integrated Behavioral Health Follow Up Visit Via Phone  MRN: [id number] Name: Courtney Stafford  Type of Service: [place] Interpretor:No. Interpretor Name and Language: Not applicable.  SUBJECTIVE: Courtney Stafford is a 48 y.o. female accompanied by herself. Patient was referred by the endocrinology group for mental health. Patient reports the following symptoms/concerns: She reports that she has been doing okay since the last follow up session. She asked about if her next appointment would be over the phone or in the clinic because she found out that the bus stops running at 5 pm due to COVID 19. She reports that she has tried reaching out to [employer] for assistance with hygiene products and clothing but couldn't get anyone on the phone. She explains that her mood is about the same. She notes that she is going a little stir crazy with not being able to volunteer at the moment and would like to have something to do. She asked about when phase three would begin. She denies suicidal and homicidal thoughts.  Duration of problem: ; Severity of problem: moderate  OBJECTIVE: Mood: Euthymic and Affect: Appropriate Risk of harm to self or others: No plan to harm self or others  LIFE CONTEXT: Family and Social: See above. School/Work: see above. Self-Care: see above. Life Changes: see above.  GOALS ADDRESSED: Patient will: 1.  Reduce symptoms of: depression  2.  Increase knowledge and/or ability of: coping skills, healthy habits, self-management skills and stress reduction  3.  Demonstrate ability to: Increase healthy adjustment to current life circumstances  INTERVENTIONS: Interventions utilized:  Brief CBT was utilized by the clinician focusing on the patient's mood and affect on behavior. Clinician processed with the patient regarding how she has been doing since the last follow up session. Clinician discussed with the patient regarding that  her next appointment should be over the phone but if it's not she would be called a week in advance to let her know. Clinician explained to the patient that worse case scenario that her appointment could be rescheduled with another provider during day time clinic hours. Clinician informed the patient that the tentative date for phase II ending in [place] until June 26th until changed or cancelled. Clinician explained to the patient that the public libraries may not open until Phase III. Clinician encouraged the patient to focus on the positives of what she does have versus what she doesn't have in terms of the fact that she is not sick with the virus, has shelter, food, and transportation.  Standardized Assessments completed: GAD-7 and PHQ 9  ASSESSMENT: Patient currently experiencing see above.  Patient may benefit from see above .  PLAN: 1. Follow up with behavioral health clinician on : two weeks or earlier if needed. 2. Behavioral recommendations: see above. 3. Referral(s): [facility] (In Clinic) 4. "From scale of 1-10, how likely are you to follow plan?":   Bayard Hugger, LCSW

## 2019-05-15 ENCOUNTER — Other Ambulatory Visit: Payer: Self-pay

## 2019-05-15 ENCOUNTER — Telehealth: Payer: Self-pay | Admitting: Licensed Clinical Social Worker

## 2019-05-15 ENCOUNTER — Ambulatory Visit: Payer: Self-pay | Admitting: Licensed Clinical Social Worker

## 2019-05-15 DIAGNOSIS — F4310 Post-traumatic stress disorder, unspecified: Secondary | ICD-10-CM

## 2019-05-15 DIAGNOSIS — F331 Major depressive disorder, recurrent, moderate: Secondary | ICD-10-CM

## 2019-05-15 DIAGNOSIS — F411 Generalized anxiety disorder: Secondary | ICD-10-CM

## 2019-05-15 NOTE — Telephone Encounter (Signed)
Clinician made a referral for the patient through [facility] to get ride coordination to her food pantry appointment at the [facility].

## 2019-05-15 NOTE — BH Specialist Note (Signed)
Integrated Behavioral Health Follow Up Visit Via Phone  MRN: [id number] Name: Courtney Stafford  Type of Service: [place] Interpretor:No. Interpretor Name and Language: Not applicable.   SUBJECTIVE: Courtney Stafford is a 48 y.o. female accompanied by herself. Patient was referred by Endocrinology group for mental health. Patient reports the following symptoms/concerns: She reports that she has been feeling bored and is going stir crazy being in the house all the time. She expressed frustration with phase II of [facility] reopening being extended for another three weeks because she is wanting to be able to volunteer again at [employer]. She explains that she is stressed because her friend who agreed to help her transport the items from the [facility] back to her apartment is not answering his phone and its too heavy to carry herself from the bus stop to her apartment. She explains that she has contacted [employer] a few times but hasn't gotten anyone to answer the phone. She denies suicidal and homicidal thoughts.  Duration of problem: ; Severity of problem: moderate  OBJECTIVE: Mood: Euthymic and Affect: Appropriate Risk of harm to self or others: No plan to harm self or others  LIFE CONTEXT: Family and Social: See above. School/Work: See above. Self-Care: See above. Life Changes: See above.  GOALS ADDRESSED: Patient will: 1.  Reduce symptoms of: anxiety and depression  2.  Increase knowledge and/or ability of: coping skills, healthy habits and self-management skills  3.  Demonstrate ability to: Increase healthy adjustment to current life circumstances  INTERVENTIONS: Interventions utilized:  Brief CBT was utilized by the clinician focusing on the patient's anxiety and affect on normal cognition. Clinician processed with the patient regarding how she has been doing since the last follow up session. Clinician made a referral to [facility]  ride coordination to be able to have transportation to and her from her apartment to the [facility] food pantry. Clinician asked the patient if she has tried to contact [facility] to see if she can find out about a program where she can get hygiene products and clothing items. Clinician explained to the patient that she understands that she is frustrated and would like for things to go back to normal but the COVID 19 virus cases in [place] are rising at a rapid pace. Clinician encouraged the patient to focus on the positive things in her life rather than the negative.  Standardized Assessments completed: GAD-7 and PHQ 9  ASSESSMENT: Patient currently experiencing see above.   Patient may benefit from see above.  PLAN: 1. Follow up with behavioral health clinician on : two weeks or earlier if needed. 2. Behavioral recommendations: see above.  3. Referral(s): [place] (In Clinic) 4. "From scale of 1-10, how likely are you to follow plan?":   Bayard Hugger, LCSW

## 2019-05-16 ENCOUNTER — Other Ambulatory Visit: Payer: Self-pay

## 2019-05-16 MED ORDER — MIRTAZAPINE 30 MG PO TABS: 30.0000 mg | ORAL_TABLET | Freq: Every day | ORAL | 2 refills | Status: DC

## 2019-05-29 ENCOUNTER — Other Ambulatory Visit: Payer: Self-pay

## 2019-05-29 ENCOUNTER — Ambulatory Visit: Payer: Self-pay | Admitting: Licensed Clinical Social Worker

## 2019-05-29 DIAGNOSIS — F331 Major depressive disorder, recurrent, moderate: Secondary | ICD-10-CM

## 2019-05-29 DIAGNOSIS — F411 Generalized anxiety disorder: Secondary | ICD-10-CM

## 2019-05-29 DIAGNOSIS — F4310 Post-traumatic stress disorder, unspecified: Secondary | ICD-10-CM

## 2019-05-29 NOTE — BH Specialist Note (Signed)
Integrated Behavioral Health Follow Up Visit Via phone  MRN: [id number] Name: Courtney Stafford  Type of Service: [place] Interpretor:No. Interpretor Name and Language: not applicable  SUBJECTIVE: Courtney Stafford is a 48 y.o. female accompanied by herself. Patient was referred by the Endocrinology clinic for mental health. Patient reports the following symptoms/concerns: She reports that [employer] never contacted her about ride coordination. She reports that her step sister ended up taking her to [facility] to pick up food and she asked about hygiene products but was told that they do not provide those types of goods to consumers. She reports that she has reached out to [employer] about toilet paper and hygiene products but has yet to receive a call back. She reports that she is stressed about only having one role of toilet paper and the friend who normal helps her out is not answering his phone right now. She notes that she is not looking forward to next month because its the anniversary of her son's death and her birthday. She notes that she has been doing a writing a little bit in her journal but not much. She denies suicidal and homicidal thoughts.  Duration of problem: ; Severity of problem: moderate  OBJECTIVE: Mood: Euthymic and Affect: Appropriate Risk of harm to self or others: No plan to harm self or others  LIFE CONTEXT: Family and Social: See above. School/Work: See above. Self-Care: See above. Life Changes: See above.  GOALS ADDRESSED: Patient will: 1.  Reduce symptoms of: depression and grief  2.  Increase knowledge and/or ability of: healthy habits, self-management skills and stress reduction  3.  Demonstrate ability to: Increase healthy adjustment to current life circumstances  INTERVENTIONS: Interventions utilized:  Brief CBT was utilized by the clinician during today's session by providing emotional support  and encouragement. Clinician processed with the patient regarding how she has been doing since the last follow up session. Clinician informed the patient that [facility] is handing out hygiene products like: body wash, conditioner, men's razors, soap, and deodorant. Clinician explained that [facility] is not handing out toilet paper but could see if she can find some kind of resource for her. Clinician explained to the patient that she knows that she has had a rough time with her routine being out of wack due to COVID 19, loosing her son in the last year, and dealing with a lot of stress. Clinician encouraged the patient to focus on the positive things in her life rather than the negative. Clinician asked the patient if she has been utilizing a journal to express her feelings and express gratitude for the things that she does have.  Standardized Assessments completed: GAD-7 and PHQ 9  ASSESSMENT: Patient currently experiencing see above.   Patient may benefit from see above.  PLAN: 1. Follow up with behavioral health clinician on : two weeks or earlier if needed. 2. Behavioral recommendations: see above. 3. Referral(s): [facility] (In Clinic) 4. "From scale of 1-10, how likely are you to follow plan?":   Bayard Hugger, LCSW

## 2019-06-06 ENCOUNTER — Other Ambulatory Visit: Payer: Self-pay

## 2019-06-12 ENCOUNTER — Ambulatory Visit: Payer: Self-pay | Admitting: Licensed Clinical Social Worker

## 2019-06-12 ENCOUNTER — Other Ambulatory Visit: Payer: Self-pay

## 2019-06-12 DIAGNOSIS — F331 Major depressive disorder, recurrent, moderate: Secondary | ICD-10-CM

## 2019-06-12 DIAGNOSIS — F411 Generalized anxiety disorder: Secondary | ICD-10-CM

## 2019-06-12 DIAGNOSIS — F4310 Post-traumatic stress disorder, unspecified: Secondary | ICD-10-CM

## 2019-06-12 NOTE — BH Specialist Note (Signed)
Integrated Behavioral Health Follow Up Visit Via Phone.  MRN: [id number] Name: Courtney Stafford  Type of Service: [place] Interpretor:No. Interpretor Name and Language: Not applicable.  SUBJECTIVE: Courtney Stafford is a 48 y.o. female accompanied by herself. Patient was referred by Endocrinology group for mental health. Patient reports the following symptoms/concerns: She reports that not much at all has been happening in the last two weeks. She reports that someone from the clinic brought her some toilet paper and some food last week because she had talked to her therapist and knew she needed it. She notes that she is almost out of toilet paper again. She described feeling anxious about having to arrive an hour earlier because the way that the bus runs for her 11:00 am lab appointment and wanted to know if she could possibly get her lab earlier. She notes that she has been missing her son and thinking about him a whole lot due to the upcoming anniversary of his death. She denies suicidal and homicidal thoughts.  Duration of problem: ; Severity of problem: moderate  OBJECTIVE: Mood: Euthymic and Affect: Appropriate Risk of harm to self or others: No plan to harm self or others  LIFE CONTEXT: Family and Social: Ms. Spilman reports that she hasn't heard from her daughter.  School/Work: See above. Self-Care: See above. Life Changes: See above.  GOALS ADDRESSED: Patient will: 1.  Reduce symptoms of: depression  2.  Increase knowledge and/or ability of: coping skills  3.  Demonstrate ability to: Increase healthy adjustment to current life circumstances  INTERVENTIONS: Interventions utilized:  Brief CBT was utilized by the clinician during today's follow up session with a patient. Clinician processed with the patient regarding how she has been doing since the last follow up session. Clinician explained to the patient that she is glad that a staff member  from [facility] brought her some things that she needed. Clinician reminded the patient when she comes for her lab appointment tomorrow to pick up some hygiene products that she needs at the clinic. Clinician explained to the patient that she will reach out to the care coordinator to see if she could possibly have an earlier lab appointment so she wouldn't have to wait around for an hour. Clinician explained to the patient that she understands that she is having a difficult time with her upcoming birthday and anniversary of her son's passing. Clinician encouraged the patent to focus on what she does have versus what she doesn't have.  Standardized Assessments completed: GAD-7 and PHQ 9  ASSESSMENT: Patient currently experiencing see above.   Patient may benefit from see above.  PLAN: 1. Follow up with behavioral health clinician on : see above. 2. Behavioral recommendations: see above. 3. Referral(s): [place] (In Clinic) 4. "From scale of 1-10, how likely are you to follow plan?":   Bayard Hugger, LCSW

## 2019-06-13 ENCOUNTER — Other Ambulatory Visit: Payer: Self-pay

## 2019-06-13 ENCOUNTER — Telehealth: Payer: Self-pay | Admitting: Pharmacist

## 2019-06-13 NOTE — Telephone Encounter (Signed)
06/13/2019 10:29:27 AM - Lantus Vials refill to [name]  06/13/2019 Taking [employer] refill request to [facility] for provider to sign & return on Lantus Vials Inject 20 units under the skin daily at bedtime (4).Courtney Stafford

## 2019-06-14 ENCOUNTER — Ambulatory Visit: Payer: Self-pay

## 2019-06-20 ENCOUNTER — Other Ambulatory Visit: Payer: Self-pay

## 2019-06-21 ENCOUNTER — Ambulatory Visit: Payer: Self-pay

## 2019-06-26 ENCOUNTER — Telehealth: Payer: Self-pay | Admitting: Pharmacist

## 2019-06-26 ENCOUNTER — Other Ambulatory Visit: Payer: Self-pay

## 2019-06-26 ENCOUNTER — Ambulatory Visit: Payer: Self-pay | Admitting: Licensed Clinical Social Worker

## 2019-06-26 DIAGNOSIS — F411 Generalized anxiety disorder: Secondary | ICD-10-CM

## 2019-06-26 DIAGNOSIS — F331 Major depressive disorder, recurrent, moderate: Secondary | ICD-10-CM

## 2019-06-26 DIAGNOSIS — F4310 Post-traumatic stress disorder, unspecified: Secondary | ICD-10-CM

## 2019-06-26 NOTE — BH Specialist Note (Signed)
Integrated Behavioral Health Follow Up Visit Via Phone  MRN: [id number] Name: Courtney Stafford  Type of Service: [place] Interpretor:No. Interpretor Name and Language:   SUBJECTIVE: Courtney Stafford is a 48 y.o. female accompanied by herself. Patient was referred by Endocrinology group  for mental health. Patient reports the following symptoms/concerns: She notes that she has been trying to get in touch with one of her friends who helps her out with different things but he won't answer the phone. She explains that she is having a hard time due to it being her birthday month and next week it will be a year since her son passed away. She notes that she hasn't heard from her family or anything. She notes that she can't eat, hardly sleep, can't go outside much, and misses volunteering at [employer]. She describes feeling a lot of cramping in her legs due to restless leg and Gabapentin is not helping. She notes that she is aggravated with not being able to sleep and not being able to leave the house. She notes that she tries to put a pillow between her legs but it doesn't seem to help. She denies suicidal and homicidal thoughts.  Duration of problem: ; Severity of problem: severe  OBJECTIVE: Mood: Euthymic and Affect: Appropriate Risk of harm to self or others: No plan to harm self or others  LIFE CONTEXT: Family and Social: See above. School/Work: See above. Self-Care: See above. Life Changes: See above.   GOALS ADDRESSED: Patient will: 1.  Reduce symptoms of: grief, insomnia  2.  Increase knowledge and/or ability of: coping skills, healthy habits and self-management skills  3.  Demonstrate ability to: Increase healthy adjustment to current life circumstances  INTERVENTIONS: Interventions utilized:  Brief CBT was utilized by the clinician focusing on the patient's grief and issues with restless leg plus insomnia. Clinician processed with the patient  regarding how she has been doing since the last follow up session. Clinician explained to the patient that she has an appointment on August 18th and will update her chart to be able to discuss her issues with restless leg syndrome. Clinician explained to the patient that she can speak with the psychiatric consultant regarding her insomnia, worsening depression, and anxiety but he may not adjust her medications due to situational stressors. Clinician explained to the patient that it sounds like she is feeling isolated due to the pandemic, not being able to volunteer, limited on resources, approaching her birthday, and anniversary of son's death. Clinician explained to the patient that she understands that she is having a really hard time and wants her to try to focus on what she does have versus what she does not have. Clinician encouraged the patient to reach out to her family for additional support.  Standardized Assessments completed: GAD-7 and PHQ 9  ASSESSMENT: Patient currently experiencing see above.   Patient may benefit from see above.  PLAN: 1. Follow up with behavioral health clinician on : one week or earlier if needed. 2. Behavioral recommendations: Case consultation with Dr. Octavia Heir, MD, psychiatric consultant on Tuesday August 18th @ 9 am.  3. Referral(s): [facility] (In Clinic) 4. "From scale of 1-10, how likely are you to follow plan?":   Bayard Hugger, LCSW

## 2019-06-26 NOTE — Telephone Encounter (Signed)
06/26/2019 10:23:31 AM - Lantus Vials refill  06/26/2019 Faxed [employer] refill request for Lantus Vials Inject 20 units daily at bedtime #4.Delos Haring

## 2019-06-27 ENCOUNTER — Other Ambulatory Visit: Payer: Self-pay

## 2019-06-27 DIAGNOSIS — E119 Type 2 diabetes mellitus without complications: Secondary | ICD-10-CM

## 2019-06-28 LAB — CBC WITH DIFFERENTIAL/PLATELET
Basophils Absolute: 0.1 10*3/uL (ref 0.0–0.2)
Basos: 1 %
EOS (ABSOLUTE): 0.3 10*3/uL (ref 0.0–0.4)
Eos: 3 %
Hematocrit: 49.3 % — ABNORMAL HIGH (ref 34.0–46.6)
Hemoglobin: 15.8 g/dL (ref 11.1–15.9)
Immature Grans (Abs): 0 10*3/uL (ref 0.0–0.1)
Immature Granulocytes: 0 %
Lymphocytes Absolute: 1.9 10*3/uL (ref 0.7–3.1)
Lymphs: 19 %
MCH: 29 pg (ref 26.6–33.0)
MCHC: 32 g/dL (ref 31.5–35.7)
MCV: 91 fL (ref 79–97)
Monocytes Absolute: 0.8 10*3/uL (ref 0.1–0.9)
Monocytes: 8 %
Neutrophils Absolute: 6.9 10*3/uL (ref 1.4–7.0)
Neutrophils: 69 %
Platelets: 340 10*3/uL (ref 150–450)
RBC: 5.44 x10E6/uL — ABNORMAL HIGH (ref 3.77–5.28)
RDW: 12.9 % (ref 11.7–15.4)
WBC: 9.9 10*3/uL (ref 3.4–10.8)

## 2019-06-28 LAB — LIPID PANEL
Chol/HDL Ratio: 2.5 ratio (ref 0.0–4.4)
Cholesterol, Total: 151 mg/dL (ref 100–199)
HDL: 60 mg/dL (ref >39)
LDL Calculated: 68 mg/dL (ref 0–99)
Triglycerides: 115 mg/dL (ref 0–149)
VLDL Cholesterol Cal: 23 mg/dL (ref 5–40)

## 2019-06-28 LAB — COMPREHENSIVE METABOLIC PANEL
ALT: 11 IU/L (ref 0–32)
AST: 13 IU/L (ref 0–40)
Albumin/Globulin Ratio: 1.6 (ref 1.2–2.2)
Albumin: 4.2 g/dL (ref 3.8–4.8)
Alkaline Phosphatase: 114 IU/L (ref 39–117)
BUN/Creatinine Ratio: 21 (ref 9–23)
BUN: 21 mg/dL (ref 6–24)
Bilirubin Total: 0.5 mg/dL (ref 0.0–1.2)
CO2: 23 mmol/L (ref 20–29)
Calcium: 9.4 mg/dL (ref 8.7–10.2)
Chloride: 95 mmol/L — ABNORMAL LOW (ref 96–106)
Creatinine, Ser: 1.02 mg/dL — ABNORMAL HIGH (ref 0.57–1.00)
GFR calc Af Amer: 75 mL/min/{1.73_m2} (ref >59)
GFR calc non Af Amer: 65 mL/min/{1.73_m2} (ref >59)
Globulin, Total: 2.6 g/dL (ref 1.5–4.5)
Glucose: 399 mg/dL — ABNORMAL HIGH (ref 65–99)
Potassium: 4.3 mmol/L (ref 3.5–5.2)
Sodium: 135 mmol/L (ref 134–144)
Total Protein: 6.8 g/dL (ref 6.0–8.5)

## 2019-06-28 LAB — MICROALBUMIN / CREATININE URINE RATIO
Creatinine, Urine: 103.1 mg/dL
Microalb/Creat Ratio: 37 mg/g creat — ABNORMAL HIGH (ref 0–29)
Microalbumin, Urine: 38.5 ug/mL

## 2019-06-28 LAB — HEMOGLOBIN A1C
Est. average glucose Bld gHb Est-mCnc: 189 mg/dL
Hgb A1c MFr Bld: 8.2 % — ABNORMAL HIGH (ref 4.8–5.6)

## 2019-06-28 LAB — TSH: TSH: 0.693 u[IU]/mL (ref 0.450–4.500)

## 2019-07-03 ENCOUNTER — Encounter: Payer: Self-pay | Admitting: Gerontology

## 2019-07-03 ENCOUNTER — Other Ambulatory Visit: Payer: Self-pay

## 2019-07-03 ENCOUNTER — Ambulatory Visit: Payer: Self-pay | Admitting: Licensed Clinical Social Worker

## 2019-07-03 ENCOUNTER — Ambulatory Visit: Payer: Self-pay | Admitting: Gerontology

## 2019-07-03 VITALS — BP 140/87 | HR 93 | Ht <= 58 in | Wt 201.1 lb

## 2019-07-03 DIAGNOSIS — E109 Type 1 diabetes mellitus without complications: Secondary | ICD-10-CM

## 2019-07-03 DIAGNOSIS — F331 Major depressive disorder, recurrent, moderate: Secondary | ICD-10-CM

## 2019-07-03 DIAGNOSIS — G2581 Restless legs syndrome: Secondary | ICD-10-CM

## 2019-07-03 DIAGNOSIS — F411 Generalized anxiety disorder: Secondary | ICD-10-CM

## 2019-07-03 DIAGNOSIS — F4310 Post-traumatic stress disorder, unspecified: Secondary | ICD-10-CM

## 2019-07-03 MED ORDER — MIRTAZAPINE 45 MG PO TABS: 45.0000 mg | ORAL_TABLET | Freq: Every day | ORAL | 2 refills | Status: DC

## 2019-07-03 MED ORDER — GABAPENTIN 300 MG PO CAPS: ORAL_CAPSULE | ORAL | 0 refills | Status: DC

## 2019-07-03 NOTE — Patient Instructions (Addendum)
Carbohydrate Counting for Diabetes Mellitus, Adult  Carbohydrate counting is a method of keeping track of how many carbohydrates you eat. Eating carbohydrates naturally increases the amount of sugar (glucose) in the blood. Counting how many carbohydrates you eat helps keep your blood glucose within normal limits, which helps you manage your diabetes (diabetes mellitus). It is important to know how many carbohydrates you can safely have in each meal. This is different for every person. A diet and nutrition specialist (registered dietitian) can help you make a meal plan and calculate how many carbohydrates you should have at each meal and snack. Carbohydrates are found in the following foods:  Grains, such as breads and cereals.  Dried beans and soy products.  Starchy vegetables, such as potatoes, peas, and corn.  Fruit and fruit juices.  Milk and yogurt.  Sweets and snack foods, such as cake, cookies, candy, chips, and soft drinks. How do I count carbohydrates? There are two ways to count carbohydrates in food. You can use either of the methods or a combination of both. Reading "Nutrition Facts" on packaged food The "Nutrition Facts" list is included on the labels of almost all packaged foods and beverages in the [place] It includes:  The serving size.  Information about nutrients in each serving, including the grams (g) of carbohydrate per serving. To use the "Nutrition Facts":  Decide how many servings you will have.  Multiply the number of servings by the number of carbohydrates per serving.  The resulting number is the total amount of carbohydrates that you will be having. Learning standard serving sizes of other foods When you eat carbohydrate foods that are not packaged or do not include "Nutrition Facts" on the label, you need to measure the servings in order to count the amount of carbohydrates:  Measure the foods that you will eat with a food scale or measuring cup, if needed.   Decide how many standard-size servings you will eat.  Multiply the number of servings by 15. Most carbohydrate-rich foods have about 15 g of carbohydrates per serving. ? For example, if you eat 8 oz (170 g) of strawberries, you will have eaten 2 servings and 30 g of carbohydrates (2 servings x 15 g = 30 g).  For foods that have more than one food mixed, such as soups and casseroles, you must count the carbohydrates in each food that is included. The following list contains standard serving sizes of common carbohydrate-rich foods. Each of these servings has about 15 g of carbohydrates:   hamburger bun or  English muffin.   oz (15 mL) syrup.   oz (14 g) jelly.  1 slice of bread.  1 six-inch tortilla.  3 oz (85 g) cooked rice or pasta.  4 oz (113 g) cooked dried beans.  4 oz (113 g) starchy vegetable, such as peas, corn, or potatoes.  4 oz (113 g) hot cereal.  4 oz (113 g) mashed potatoes or  of a large baked potato.  4 oz (113 g) canned or frozen fruit.  4 oz (120 mL) fruit juice.  4-6 crackers.  6 chicken nuggets.  6 oz (170 g) unsweetened dry cereal.  6 oz (170 g) plain fat-free yogurt or yogurt sweetened with artificial sweeteners.  8 oz (240 mL) milk.  8 oz (170 g) fresh fruit or one small piece of fruit.  24 oz (680 g) popped popcorn. Example of carbohydrate counting Sample meal  3 oz (85 g) chicken breast.  6 oz (170 g)   brown rice.  4 oz (113 g) corn.  8 oz (240 mL) milk.  8 oz (170 g) strawberries with sugar-free whipped topping. Carbohydrate calculation 1. Identify the foods that contain carbohydrates: ? Rice. ? Corn. ? Milk. ? Strawberries. 2. Calculate how many servings you have of each food: ? 2 servings rice. ? 1 serving corn. ? 1 serving milk. ? 1 serving strawberries. 3. Multiply each number of servings by 15 g: ? 2 servings rice x 15 g = 30 g. ? 1 serving corn x 15 g = 15 g. ? 1 serving milk x 15 g = 15 g. ? 1 serving  strawberries x 15 g = 15 g. 4. Add together all of the amounts to find the total grams of carbohydrates eaten: ? 30 g + 15 g + 15 g + 15 g = 75 g of carbohydrates total. Summary  Carbohydrate counting is a method of keeping track of how many carbohydrates you eat.  Eating carbohydrates naturally increases the amount of sugar (glucose) in the blood.  Counting how many carbohydrates you eat helps keep your blood glucose within normal limits, which helps you manage your diabetes.  A diet and nutrition specialist (registered dietitian) can help you make a meal plan and calculate how many carbohydrates you should have at each meal and snack. This information is not intended to replace advice given to you by your health care provider. Make sure you discuss any questions you have with your health care provider. Document Released: 11/01/2005 Document Revised: 05/26/2017 Document Reviewed: 04/14/2016 Elsevier Patient Education  2020 [employer].  Restless Legs Syndrome Restless legs syndrome is a condition that causes uncomfortable feelings or sensations in the legs, especially while sitting or lying down. The sensations usually cause an overwhelming urge to move the legs. The arms can also sometimes be affected. The condition can range from mild to severe. The symptoms often interfere with a person's ability to sleep. What are the causes? The cause of this condition is not known. What increases the risk? The following factors may make you more likely to develop this condition:  Being older than 50.  Pregnancy.  Being a woman. In general, the condition is more common in women than in men.  A family history of the condition.  Having iron deficiency.  Overuse of caffeine, nicotine, or alcohol.  Certain medical conditions, such as kidney disease, Parkinson's disease, or nerve damage.  Certain medicines, such as those for high blood pressure, nausea, colds, allergies, depression, and some  heart conditions. What are the signs or symptoms? The main symptom of this condition is uncomfortable sensations in the legs, such as:  Pulling.  Tingling.  Prickling.  Throbbing.  Crawling.  Burning. Usually, the sensations:  Affect both sides of the body.  Are worse when you sit or lie down.  Are worse at night. These may wake you up or make it difficult to fall asleep.  Make you have a strong urge to move your legs.  Are temporarily relieved by moving your legs. The arms can also be affected, but this is rare. People who have this condition often have tiredness during the day because of their lack of sleep at night. How is this diagnosed? This condition may be diagnosed based on:  Your symptoms.  Blood tests. In some cases, you may be monitored in a sleep lab by a specialist (a sleep study). This can detect any disruptions in your sleep. How is this treated? This condition is  treated by managing the symptoms. This may include:  Lifestyle changes, such as exercising, using relaxation techniques, and avoiding caffeine, alcohol, or tobacco.  Medicines. Anti-seizure medicines may be tried first. Follow these instructions at home:     General instructions  Take over-the-counter and prescription medicines only as told by your health care provider.  Use methods to help relieve the uncomfortable sensations, such as: ? Massaging your legs. ? Walking or stretching. ? Taking a cold or hot bath.  Keep all follow-up visits as told by your health care provider. This is important. Lifestyle  Practice good sleep habits. For example, go to bed and get up at the same time every day. Most adults should get 7-9 hours of sleep each night.  Exercise regularly. Try to get at least 30 minutes of exercise most days of the week.  Practice ways of relaxing, such as yoga or meditation.  Avoid caffeine and alcohol.  Do not use any products that contain nicotine or tobacco, such  as cigarettes and e-cigarettes. If you need help quitting, ask your health care provider. Contact a health care provider if:  Your symptoms get worse or they do not improve with treatment. Summary  Restless legs syndrome is a condition that causes uncomfortable feelings or sensations in the legs, especially while sitting or lying down.  The symptoms often interfere with a person's ability to sleep.  This condition is treated by managing the symptoms. You may need to make lifestyle changes or take medicines. This information is not intended to replace advice given to you by your health care provider. Make sure you discuss any questions you have with your health care provider. Document Released: 10/22/2002 Document Revised: 11/21/2017 Document Reviewed: 11/21/2017 Elsevier Patient Education  2020 [employer]

## 2019-07-03 NOTE — BH Specialist Note (Signed)
Integrated Behavioral Health Follow Up Visit Via Phone  MRN: [id number] Name: Courtney Stafford  Type of Service: [place] Interpretor:No. Interpretor Name and Language: Not applicable.   SUBJECTIVE: Courtney Stafford is a 48 y.o. female accompanied by herself. Patient was referred by the endocrinology group at [facility] for mental health. Patient reports the following symptoms/concerns: She notes that she is hoping that the increase in the Gabapentin and Mirtazapine will help her to sleep. She explains that she is trying to be hopeful about the Gabapentin helping with the restless leg but she wonders if she is immune to the medication. She explains that she has been having a lot of crying spells this week due to the anniversary of her son's death and her birthday is on [birth year]. She notes that her daughter left her a message on Facebook saying she got a new phone and lost her phone number. She explains that she feels like her daughter has been ignoring her since her son passed away so she is glad that she is reaching out to her. She reports that she felt a little down today when she was weighed and saw that she weighs 201. She denies suicidal and homicidal thoughts.  Duration of problem: ; Severity of problem: severe  OBJECTIVE: Mood: Euthymic and Affect: Appropriate Risk of harm to self or others: No plan to harm self or others  LIFE CONTEXT: Family and Social: see above. School/Work: see above. Self-Care: see above. Life Changes: see above.   GOALS ADDRESSED: Patient will: 1.  Reduce symptoms of: grief  2.  Increase knowledge and/or ability of: coping skills, self-management skills and stress reduction  3.  Demonstrate ability to: Increase healthy adjustment to current life circumstances  INTERVENTIONS: Interventions utilized:  Supportive Counseling was utilized by the clinician focusing on the patient's grief due to upcoming anniversary of her son's  death. Clinician processed with the patient regarding how she has been doing since the last follow up session. Clinician explained to the patient that she had a case consultation with Dr. Octavia Heir, MD, psychiatric consultant who recommended increasing her Mirtazapine from 30 mg to 45 mg. Clinician explained that hopefully with the increase in psychotropic medication and changes that Courtney Shadow, NP made to her Gabapentin dosage that she will be able to get some sleep at night. Clinician asked the patient if she has any traditions in her family that she utilizes on the anniversary of a loved one's passing. Clinician suggested that the patient say a payer or light a remembrance candle. Clinician explained to the patient that she is glad to hear that her daughter has an explanation for why she has not been reaching out to her because she lost her phone number. Clinician explained to the patient that she thinks its important to utilize her support system right now.  Standardized Assessments completed: next session.  ASSESSMENT: Patient currently experiencing see above.   Patient may benefit from see above.  PLAN: 1. Follow up with behavioral health clinician on : one to two weeks or earlier if needed. 2. Behavioral recommendations: see above. 3. Referral(s): [place] (In Clinic) 4. "From scale of 1-10, how likely are you to follow plan?":   Bayard Hugger, LCSW

## 2019-07-03 NOTE — Progress Notes (Signed)
Established Patient Office Visit  Subjective:  Patient ID: Courtney Stafford, female    DOB: Sep 18, 1971  Age: 48 y.o. MRN: [id number]  CC:  Chief Complaint  Patient presents with  . Follow-up    restless legs    HPI Courtney Stafford presents for follow up of type 1 diabetes  and c/o restless leg while asleep. She checks her blood pressure at home and she states that her SBP ranges between 98-144 and DBP is between 73-96. She checks her blood glucose tid and her fasting blood glucose readings ranges between 76-289, evening reading ranges between 201- 347 and night time is between 154-240 mg/dl. She states that it's difficult to adhere to diabetic diet, but she admits to drinking 1 Liter of soda daily. She takes 25 units of Lantus at bedtime and she reports that taking gabapentin 300 mg qid does not relieve her restless leg while she's at sleep. She states that her sleep is affected. She denies hypoglycemic symptoms,cramp or aching, chest pain, palpitation, fever, chills and no further concerns.  Past Medical History:  Diagnosis Date  . Diabetes mellitus without complication (HCC)   . Hypertension     Past Surgical History:  Procedure Laterality Date  . CARPAL TUNNEL RELEASE  6 years ago   both hands    Family History  Problem Relation Age of Onset  . Cancer Father     Social History   Socioeconomic History  . Marital status: Single    Spouse name: Not on file  . Number of children: Not on file  . Years of education: Not on file  . Highest education level: High school graduate  Occupational History  . Occupation: unemployed  Social Needs  . Financial resource strain: Not very hard  . Food insecurity    Worry: Sometimes true    Inability: Sometimes true  . Transportation needs    Medical: Yes    Non-medical: Yes  Tobacco Use  . Smoking status: Current Some Day Smoker    Packs/day: 0.10  . Smokeless tobacco: Former Engineer, waterUser  Substance and Sexual Activity  . Alcohol  use: No  . Drug use: No  . Sexual activity: Not on file  Lifestyle  . Physical activity    Days per week: 0 days    Minutes per session: 0 min  . Stress: Rather much  Relationships  . Social Musicianconnections    Talks on phone: Three times a week    Gets together: Never    Attends religious service: Never    Active member of club or organization: No    Attends meetings of clubs or organizations: Never    Relationship status: Separated  . Intimate partner violence    Fear of current or ex partner: No    Emotionally abused: No    Physically abused: No    Forced sexual activity: No  Other Topics Concern  . Not on file  Social History Narrative   Completed biopsychosocial assessment, social determinants screening, administered PHQ 9, and GAD 7.       Transportation is a barrier to appointments. Receives food stamps 192 per month but worries about running out.       Support system is small.    Outpatient Medications Prior to Visit  Medication Sig Dispense Refill  . amLODipine (NORVASC) 10 MG tablet Take 1 tablet (10 mg total) by mouth daily. 90 tablet 4  . atorvastatin (LIPITOR) 10 MG tablet Take 1 tablet (10 mg total)  by mouth daily. 30 tablet 3  . Blood Glucose Monitoring Suppl Supplies MISC 1 each by Does not apply route 5 (five) times daily. 500 each 4  . carvedilol (COREG) 12.5 MG tablet Take 1 tablet (12.5 mg total) by mouth 2 (two) times daily with a meal. 60 tablet 2  . hydrochlorothiazide (HYDRODIURIL) 25 MG tablet Take 1 tablet (25 mg total) by mouth daily. 30 tablet 0  . Insulin Glargine (LANTUS) 100 UNIT/ML Solostar Pen Inject 20-25 Units into the skin daily at 10 pm. (Patient taking differently: Inject 20 Units into the skin daily at 10 pm. ) 30 mL 4  . Insulin Pen Needle (BD PEN NEEDLE NANO U/F) 32G X 4 MM MISC 5 times daily 200 each 12  . lisinopril (PRINIVIL,ZESTRIL) 40 MG tablet Take 1 tablet (40 mg total) by mouth daily. 90 tablet 4  . mirtazapine (REMERON) 45 MG  tablet Take 1 tablet (45 mg total) by mouth at bedtime. 30 tablet 2  . NOVOLOG FLEXPEN 100 UNIT/ML FlexPen ADJUST UNITS BASED ON CARB INTAKE UP TO 20 UNITS PER DOSE. MAX DAILY DOSE IS 60 UNITS. (Patient taking differently: 5-8 Units 3 (three) times daily with meals. ) 75 mL 0  . gabapentin (NEURONTIN) 300 MG capsule Take one capsule four times daily 120 capsule 0  . vitamin B-12 (CYANOCOBALAMIN) 500 MCG tablet Take 1 tablet (500 mcg total) by mouth daily. (Patient not taking: Reported on 12/05/2018) 30 tablet 3   No facility-administered medications prior to visit.     Allergies  Allergen Reactions  . Phenergan [Promethazine] Hives    ROS Review of Systems  Constitutional: Negative.   Respiratory: Negative.   Cardiovascular: Negative.   Endocrine: Positive for polyuria.  Genitourinary: Negative.   Skin: Negative.   Neurological: Negative.   Psychiatric/Behavioral: Positive for sleep disturbance.      Objective:    Physical Exam  Constitutional: She is oriented to person, place, and time. She appears well-developed and well-nourished.  HENT:  Head: Normocephalic and atraumatic.  Eyes: Pupils are equal, round, and reactive to light. EOM are normal.  Neck: Normal range of motion.  Cardiovascular: Normal rate and regular rhythm.  Pulmonary/Chest: Effort normal and breath sounds normal.  Abdominal: Soft. Bowel sounds are normal.  Musculoskeletal: Normal range of motion.  Neurological: She is alert and oriented to person, place, and time.  Skin: Skin is warm and dry.  Psychiatric: She has a normal mood and affect. Her behavior is normal. Judgment and thought content normal.    BP 140/87 (BP Location: Left Arm, Patient Position: Sitting)   Pulse 93   Ht 4\' 9"  (1.448 m)   Wt 201 lb 1.6 oz (91.2 kg)   LMP 01/03/2016 (Within Years)   SpO2 95%   BMI 43.52 kg/m  Wt Readings from Last 3 Encounters:  07/03/19 201 lb 1.6 oz (91.2 kg)  01/02/19 189 lb 4.8 oz (85.9 kg)  12/05/18  188 lb 14.4 oz (85.7 kg)   She was encouraged to continue on weight loss regimen.  Health Maintenance Due  Topic Date Due  . PNEUMOCOCCAL POLYSACCHARIDE VACCINE AGE 19-64 HIGH RISK  07/05/1971  . HIV Screening  07/04/1984  . TETANUS/TDAP  07/04/1988  . PAP SMEAR-Modifier  07/04/1990  . OPHTHALMOLOGY EXAM  03/23/2019  . INFLUENZA VACCINE  06/16/2019    There are no preventive care reminders to display for this patient.  Lab Results  Component Value Date   TSH 0.693 06/27/2019   Lab Results  Component Value Date   WBC 9.9 06/27/2019   HGB 15.8 06/27/2019   HCT 49.3 (H) 06/27/2019   MCV 91 06/27/2019   PLT 340 06/27/2019   Lab Results  Component Value Date   NA 135 06/27/2019   K 4.3 06/27/2019   CO2 23 06/27/2019   GLUCOSE 399 (H) 06/27/2019   BUN 21 06/27/2019   CREATININE 1.02 (H) 06/27/2019   BILITOT 0.5 06/27/2019   ALKPHOS 114 06/27/2019   AST 13 06/27/2019   ALT 11 06/27/2019   PROT 6.8 06/27/2019   ALBUMIN 4.2 06/27/2019   CALCIUM 9.4 06/27/2019   ANIONGAP 9 02/12/2014   Lab Results  Component Value Date   CHOL 151 06/27/2019   Lab Results  Component Value Date   HDL 60 06/27/2019   Lab Results  Component Value Date   LDLCALC 68 06/27/2019   Lab Results  Component Value Date   TRIG 115 06/27/2019   Lab Results  Component Value Date   CHOLHDL 2.5 06/27/2019   Lab Results  Component Value Date   HGBA1C 8.2 (H) 06/27/2019      Assessment & Plan:   1. Type 1 diabetes mellitus without complication (HCC) - Her HgbA1c done on 06/27/19 was 8.2% and her goal is < 6%. She was advised to continue on current treatment regimen. -Use Diabetic diet as advised  -Check blood sugar 3 times a day, once before breakfast and others 2 hours after lunch or dinner,write down the numbers against date in a log, bring log to clinic every visit - She was advised to increase water intake -Take medications regularly as advised -Regular exercise as  tolerated    2. Restless legs - She will take 900 mg Gabapentin at bedtime and 300 mg bid. She was advised to notify clinic for worsening symptoms. She was also advised on tighter glycemic control. - gabapentin (NEURONTIN) 300 MG capsule; Take 1 capsule twice daily and 3 capsules at night.  Dispense: 150 capsule; Refill: 0     Follow-up: Return in about 5 weeks (around 08/07/2019), or if symptoms worsen or fail to improve.    Krystine Pabst Trellis PaganiniE Zaden Sako, NP

## 2019-07-09 ENCOUNTER — Other Ambulatory Visit: Payer: Self-pay

## 2019-07-09 ENCOUNTER — Ambulatory Visit: Payer: Self-pay

## 2019-07-09 DIAGNOSIS — Z79899 Other long term (current) drug therapy: Secondary | ICD-10-CM

## 2019-07-09 NOTE — Progress Notes (Signed)
Medication Management Clinic Visit Note  Patient: Courtney Stafford MRN: [id number] Date of Birth: November 03, 1971 PCP: Langston Reusing, NP   [name] 48 y.o. female presents for a MTM visit today.   Patient Information   Past Medical History:  Diagnosis Date  . Diabetes mellitus without complication ([place])   . Hypertension       Past Surgical History:  Procedure Laterality Date  . CARPAL TUNNEL RELEASE  6 years ago   both hands     Family History  Problem Relation Age of Onset  . Cancer Father      Lifestyle Diet: Breakfast:Egg sandwich Lunch: Pizza Dinner:Chicken, potatoes, beans Drinks: Water, diet soda    Current Exercise Habits: The patient does not participate in regular exercise at present  Exercise limited by: None identified    Social History   Substance and Sexual Activity  Alcohol Use No      Social History   Tobacco Use  Smoking Status Current Some Day Smoker  . Packs/day: 0.10  Smokeless Tobacco Former Counselling psychologist Maintenance/Date Completed  Last ED visit: Several years Last Visit to PCP: August Next Visit to PCP: September Specialist Visit: Behavioral health Dental Exam: Does not see dentist [facility] Pelvic/PAP Exam: Does not recall Mammogram: Does not recall Flu Vaccine: Did not receive this year  Outpatient Encounter Medications as of 07/09/2019  Medication Sig  . amLODipine (NORVASC) 10 MG tablet Take 1 tablet (10 mg total) by mouth daily.  Marland Kitchen atorvastatin (LIPITOR) 10 MG tablet Take 1 tablet (10 mg total) by mouth daily.  . Blood Glucose Monitoring Suppl Supplies MISC 1 each by Does not apply route 5 (five) times daily.  . carvedilol (COREG) 12.5 MG tablet Take 1 tablet (12.5 mg total) by mouth 2 (two) times daily with a meal.  . gabapentin (NEURONTIN) 300 MG capsule Take 1 capsule twice daily and 3 capsules at night.  . hydrochlorothiazide (HYDRODIURIL) 25 MG tablet Take 1 tablet (25 mg  total) by mouth daily.  . Insulin Glargine (LANTUS) 100 UNIT/ML Solostar Pen Inject 20-25 Units into the skin daily at 10 pm. (Patient taking differently: Inject 25 Units into the skin daily at 10 pm. )  . Insulin Pen Needle (BD PEN NEEDLE NANO U/F) 32G X 4 MM MISC 5 times daily  . lisinopril (PRINIVIL,ZESTRIL) 40 MG tablet Take 1 tablet (40 mg total) by mouth daily.  . mirtazapine (REMERON) 45 MG tablet Take 1 tablet (45 mg total) by mouth at bedtime.  Marland Kitchen NOVOLOG FLEXPEN 100 UNIT/ML FlexPen ADJUST UNITS BASED ON CARB INTAKE UP TO 20 UNITS PER DOSE. MAX DAILY DOSE IS 60 UNITS. (Patient taking differently: 5-8 Units 3 (three) times daily with meals. )  . vitamin B-12 (CYANOCOBALAMIN) 500 MCG tablet Take 1 tablet (500 mcg total) by mouth daily. (Patient not taking: Reported on 12/05/2018)   No facility-administered encounter medications on file as of 07/09/2019.     Assessment and Plan:  Type 1 diabetes mellitus: Last A1c 8.2 in August which is un-controlled. Patient states that she takes Lantus 25 units daily and Novolog 5-9 units for mealtime coverage. She checks her blood sugar three times daily in the morning, evening, and at night. Her FBG on the 22nd was 52 and she experienced symptoms of hypoglycemia including shakiness. She says that she drank 4 oz of juice and her BG had increased to 195 when she re-checked. She did report an evening blood glucose  of 375. Denies eating sweets frequently. Not exercising. Has trouble buying healthy foods because she is not working and is limited with food stamps.   Hyptertension: Patient reports taking blood pressure 1-2x daily at home. Recent blood pressures range 120s-140s/80s-90s. She is currently on an antihypertensive regimen including HCTZ 25 mg, lisinopril 40 mg, and amlodipine 10 mg. Says she is dizzy upon standing occasionally.    Restless Legs: Gabapentin recently increased at last PCP visit to 300 mg BID and 900 mg HS. Patient reports some improvement  but says that she does not think this medication works well because she has taken it for so long. Encouraged her to f/u at [facility] about alternative therapeutic options.   Mood/sleep: Patient is on mirtazapine which was recently increased at last [facility]. States she has not started new dosage yet because she has not picked up new prescription. Recent anniversary of death of her son which happened around this time last year which has been difficult for her. Reports that she will take OTC sleeping aids when she has trouble falling asleep.   Adherence: Patient reports adherence to her medications. Per pharmacy fill history patient appears to be somewhat non-compliant. Encouraged patient to take medications as prescribed. Patient stated she needed refills for atorvastatin; [facility]. Will fill medications that are due for refills. Patient plans to come in tomorrow to pick up medications.   Dorothea OgleAlex Melisssa Donner, Pharmacy Resident

## 2019-07-11 ENCOUNTER — Other Ambulatory Visit: Payer: Self-pay

## 2019-07-17 ENCOUNTER — Other Ambulatory Visit: Payer: Self-pay

## 2019-07-17 ENCOUNTER — Ambulatory Visit: Payer: Self-pay | Admitting: Licensed Clinical Social Worker

## 2019-07-17 DIAGNOSIS — F4310 Post-traumatic stress disorder, unspecified: Secondary | ICD-10-CM

## 2019-07-17 DIAGNOSIS — F331 Major depressive disorder, recurrent, moderate: Secondary | ICD-10-CM

## 2019-07-17 DIAGNOSIS — F411 Generalized anxiety disorder: Secondary | ICD-10-CM

## 2019-07-17 NOTE — BH Specialist Note (Signed)
Integrated Behavioral Health Follow Up Visit Via Phone  MRN: [id number] Name: Courtney Stafford  Type of Service: [place] Interpretor:No. Interpretor Name and Language: not applicable.  SUBJECTIVE: Courtney Stafford is a 48 y.o. female accompanied by herself. Patient was referred by the endocrinology group for mental health. Patient reports the following symptoms/concerns: She explains that she hasn't been doing too well because one of her bottom teeth in the front is really loose and has been causing her a lot of pain. She notes that she is scared that it will come out when she eats. She explains that its been bothering her for the past few weeks. She explains that she has been taking the increased dosage of Mirtazapine and has noticed a difference in her sleep. She explains that her sleep is better than what it was and sometimes she cannot sleep. She notes that she has not noticed a difference in her mood but she thinks its due to the pain in her mouth with the tooth. She notes that she talked to her sister on her son's anniversary of his death on the phone but did not do much other than that. She explains that she is not having as much pain in her legs as she was before [name], NP increased her Gabapentin. She denies suicidal and homicidal thoughts.  Duration of problem: ; Severity of problem: severe  OBJECTIVE: Mood: Euthymic and Affect: Appropriate Risk of harm to self or others: No plan to harm self or others  LIFE CONTEXT: Family and Social: See above. School/Work: See above. Self-Care: See above. Life Changes: See above.   GOALS ADDRESSED: Patient will: 1.  Reduce symptoms of: depression and insomnia  2.  Increase knowledge and/or ability of: coping skills and self-management skills  3.  Demonstrate ability to: Increase healthy adjustment to current life circumstances  INTERVENTIONS: Interventions utilized:  Brief CBT was utilized  by the clinician focusing on the patient's mood and sleep. Clinician processed with the patient regarding how she has been doing since the last follow up session. Clinician explained to the patient that we partner with a dental clinic that is through [facility], you fill out an application, and pay a 71.24 co pay. Clinician explained to the patient that we can make case by case exceptions if a patient is unable to afford the cost of the co pay that covers the first two dental visits. Clinician asked the patient if she has noticed a difference in her symptoms of anxiety, depression, and insomnia since starting the Mirtazapine 45 mg at bedtime. Clinician suggested that the patient look into volunteering at another organization until [employer] re opens. Clinician provided the patient with the contact information for the [employer] to look into volunteering at [employer]. Clinician suggested that the patient go for a walk twice a day when it is not so hot outside as a way to get some exercise, get some circulation in her legs, and burn off enough energy during the day to improve her sleep at night. Clinician suggested that the patient try to work on a routine that has a balance of things for enjoyment and responsibilities.  Standardized Assessments completed: GAD-7 and PHQ 9  ASSESSMENT: Patient currently experiencing see above.   Patient may benefit from see above.  PLAN: 1. Follow up with behavioral health clinician on : two weeks or earlier if needed. 2. Behavioral recommendations: see above.  3. Referral(s): [place] (In  Clinic) 4. "From scale of 1-10, how likely are you to follow plan?":   Althia FortsHeather B Simpson, LCSW

## 2019-07-25 ENCOUNTER — Telehealth: Payer: Self-pay | Admitting: Pharmacist

## 2019-07-25 NOTE — Telephone Encounter (Signed)
07/25/2019 2:47:51 PM - Novolog Flexpen & tips refill  07/25/2019 Sending Novo Nordisk refill request to [facility] for Dr. Mable Fill to sign for Novolog Flexpen & tips.Courtney Stafford

## 2019-07-31 ENCOUNTER — Ambulatory Visit: Payer: Self-pay | Admitting: Licensed Clinical Social Worker

## 2019-07-31 ENCOUNTER — Ambulatory Visit: Payer: Self-pay | Admitting: Student

## 2019-07-31 ENCOUNTER — Other Ambulatory Visit: Payer: Self-pay

## 2019-07-31 DIAGNOSIS — G2581 Restless legs syndrome: Secondary | ICD-10-CM

## 2019-07-31 DIAGNOSIS — F331 Major depressive disorder, recurrent, moderate: Secondary | ICD-10-CM

## 2019-07-31 DIAGNOSIS — F411 Generalized anxiety disorder: Secondary | ICD-10-CM

## 2019-07-31 DIAGNOSIS — E109 Type 1 diabetes mellitus without complications: Secondary | ICD-10-CM

## 2019-07-31 DIAGNOSIS — Z634 Disappearance and death of family member: Secondary | ICD-10-CM

## 2019-07-31 DIAGNOSIS — F4321 Adjustment disorder with depressed mood: Secondary | ICD-10-CM

## 2019-07-31 DIAGNOSIS — F4310 Post-traumatic stress disorder, unspecified: Secondary | ICD-10-CM

## 2019-07-31 NOTE — Patient Instructions (Addendum)
At bedtime, write down your blood sugar and units of Novolog taken. Example: Blood sugar of 240, I took 5 units of Novolog. If your blood sugar is normal, then you do not have to take any units of Novolog, but write down what your blood sugar is and that you took 0 units of Novolog.  If you are running low on blood glucose strips, consider taking your insulin at different points of the day throughout the week. Example: Take blood glucose in the morning on Monday, in the afternoon on Tuesday, in the evening on Wednesday, etc.

## 2019-07-31 NOTE — Progress Notes (Signed)
Follow up Diabetes/ [facility]     Patient ID: Courtney Stafford, female   DOB: [birth year], 48 y.o.   MRN: [id number] Assessment:  Courtney Stafford is a 48 y.o. female who is seen in follow up for No primary diagnosis found. at the request of [place], Chioma E, NP.  Encounter Diagnoses No diagnosis found.  Assessment/Plan:  1. Diabetes mellitus. Unclear on how blood sugars have varied and whether variation in fasting glucose occurs with food intake in the middle of night. Patient denies having hypoglycemic episodes that wake her in the middle of night. Reports of two hypoglycemic episodes in the past month concerning for potential of too much insulin intake, especially at night. Patient reports having restless legs with tingling sensations bilaterally on lower extremities until knees,which has persisted even with taking gapapentin 300mg  BID and 3 pills at night.. HbA1c of 8.2 in 06/2019, down from 10.2 in 12/2018. - Patient instructed as below - HbA1c due 09/2019 - f/u in 3 mo - Patient meeting with PCP on upcoming Tuesday, refer to PCP provider to address restless legs and gabapentin dosage  Patient Instructions  At bedtime, write down your blood sugar and units of Novolog taken. Example: Blood sugar of 240, I took 5 units of Novolog. If your blood sugar is normal, then you do not have to take any units of Novolog, but write down what your blood sugar is and that you took 0 units of Novolog.  If you are running low on blood glucose strips, consider taking your insulin at different points of the day throughout the week. Example: Take blood glucose in the morning on Monday, in the afternoon on Tuesday, in the evening on Wednesday, etc.      Subjective:  CC: diabetes f/u  HPI: Courtney Stafford is a 48 yo woman with PMHx of T1DM, HTN who presents to clinic for diabetes follow-up. She has had diabetes since she was 48 years old. For many of the years, her blood sugar has been over the  place. Her current medications include Novolog 5-9u for each meal and Lantus 25u at night once a day. Her other medications include gabapentin 300mg  5x a day, atorvastatin, lisinopril, HCTZ, and amlodipine.   Her home blood sugars has been varying, with fasting blood sugars ranging from the 90s to 270s. Denies waking up in the middle of the night with any shakes or chills.Tend to take blood sugar before bedtime, which will range in 160s-240s. Does not use a sliding scale of insulin and instead will estimate how many units to take.  Reports having shakes and chills twice in the past month (76 at 10pm right before bed on 07/15/2019, 62 on morning of 07/07/2019)Symptoms alleviated with intake of a drink (orange juice - half a cup and a whole cup if symptoms continue). Denies having passing out, cites that she wants to be careful with too low sugars because she lives alone and doesn't want to pass out.  As for diet, patient tries to eat three times a day, does not eat a whole lot. She will eat fried chicken, potatoes, and beans to keep her energy up. Beverages include [facility] Mountain Dew, water, no coffee, tea with sugar substitutes. Exercise has been minimal during pandemic.  SH:  - Previously volunteered at [employer] - Currently living at home alone - Unemployed with limited income    Review of Systems  Constitutional: Negative for appetite change and chills.  Eyes: Negative for visual disturbance.  Respiratory: Positive for shortness of breath. Negative for apnea and chest tightness.   Cardiovascular: Negative for chest pain and leg swelling.  Gastrointestinal: Negative for abdominal pain and blood in stool.  Endocrine: Positive for polyuria.  Genitourinary: Negative for hematuria.  Neurological: Negative for syncope and light-headedness.   Resting pain in legs so cannot sleep, so has been  2 pills during the day with gabapentin and then 3 pills at night. Gabapentin for years and it has not  been helping with the legs. Tingling sensation in legs (to the knee cap to the feet). Increased urinary frequency. 2x a week feeling more thirsty with little relief from beverages, used to happen a lot but less recently  Courtney Stafford  has a past medical history of Diabetes mellitus without complication ([place]) and Hypertension.  Family History, Social History, current Medications and allergies reviewed and updated in Epic.  Objective:    Last menstrual period 01/03/2016. Physical Exam: Not conducted given TeleHealth visit

## 2019-07-31 NOTE — BH Specialist Note (Signed)
Integrated Behavioral Health Follow Up Visit Via Phone  MRN: [id number] Name: Courtney Stafford  Type of Service: Integrated Behavioral Health- Individual/Family Interpretor:No. Interpretor Name and Language: not applicable.  SUBJECTIVE: Courtney Garibaldingela M Ferrie is a 48 y.o. female accompanied by herself. Patient was referred by endocrinology group for mental health. Patient reports the following symptoms/concerns: She notes that her bottom tooth fell out two weeks ago. She explains that her tooth became really loose and was shocked when it fell out. She reports that she has not been feeling well today. She notes that her sleep fluctuates between sleeping well and not sleeping well some nights due to her restless leg. She explains that she feels like the changes with the Gabapentin is not helping with her restless leg like it should be. She explains that she had a crying spell yesterday when her son's best friend reached out to her to express her condolences about her son and check in on her. She reports that her daughter called her and invited her to an earlier Thanksgiving due to her baby being due next month. She notes that she has yet to reach out to [employer] about volunteering. She explains that she has yet to start a routine and has not started to walk some. She denies suicidal and homicidal thoughts.  Duration of problem: ; Severity of problem: moderate  OBJECTIVE: Mood: Euthymic and Affect: Appropriate Risk of harm to self or others: No plan to harm self or others  LIFE CONTEXT: Family and Social: see above. School/Work: see above. Self-Care: see above. Life Changes: see above.  GOALS ADDRESSED: Patient will: 1.  Reduce symptoms of: depression  2.  Increase knowledge and/or ability of: self-management skills and stress reduction  3.  Demonstrate ability to: Increase healthy adjustment to current life circumstances  INTERVENTIONS: Interventions utilized:  Behavioral Activation and  Brief CBT was utilized by the clinician focusing on the patient's mood and restless leg. Clinician processed with the patient regarding how she has been doing since the last follow up session. Clinician explained to the patient that she will make sure one of the volunteers gives her a dentist application when she comes in on Tuesday. Clinician explained to the patient that medications may not be the only answer for solving her issues with the restless leg. Clinician explained to the patient that she thinks she is not burning off enough energy during the day, is not getting any exercise, and has not tries any at home remedies to help with her restless leg issues. Clinician suggested that the patient walk twice a day, massage her legs at night before bed, do some stretches, and try taking a warm bubble bath to help with the pain in her legs. Clinician explained to the patient that the [employer] is on the orange bus line and provided her the contact information for the Energy managervolunteer coordinator. Clinician explained to the patient that she thinks that not having a routine and the fact that she is unable to volunteer are contributing factors to her depressed mood. Clinician explained to the patient that she is glad that her daughter reached out to her and invited her to Thanksgiving.  Standardized Assessments completed: GAD-7 and PHQ 9  ASSESSMENT: Patient currently experiencing see above.   Patient may benefit from see above.  PLAN: 1. Follow up with behavioral health clinician on : two weeks or earlier if needed. 2. Behavioral recommendations: see above.  3. Ref[facility](s[facility] 4. "From scale  of 1-10, how likely are you to follow plan?":   Bayard Hugger, LCSW

## 2019-08-07 ENCOUNTER — Ambulatory Visit: Payer: Self-pay | Admitting: Gerontology

## 2019-08-07 ENCOUNTER — Other Ambulatory Visit: Payer: Self-pay

## 2019-08-07 ENCOUNTER — Encounter: Payer: Self-pay | Admitting: Gerontology

## 2019-08-07 VITALS — BP 138/93 | HR 89 | Temp 98.7°F | Ht 60.0 in | Wt 205.0 lb

## 2019-08-07 DIAGNOSIS — E109 Type 1 diabetes mellitus without complications: Secondary | ICD-10-CM

## 2019-08-07 DIAGNOSIS — E785 Hyperlipidemia, unspecified: Secondary | ICD-10-CM

## 2019-08-07 DIAGNOSIS — I10 Essential (primary) hypertension: Secondary | ICD-10-CM

## 2019-08-07 DIAGNOSIS — Z9189 Other specified personal risk factors, not elsewhere classified: Secondary | ICD-10-CM

## 2019-08-07 DIAGNOSIS — Z0000 Encounter for general adult medical examination without abnormal findings: Secondary | ICD-10-CM

## 2019-08-07 DIAGNOSIS — F411 Generalized anxiety disorder: Secondary | ICD-10-CM

## 2019-08-07 DIAGNOSIS — G2581 Restless legs syndrome: Secondary | ICD-10-CM

## 2019-08-07 MED ORDER — ATORVASTATIN CALCIUM 10 MG PO TABS: 10.0000 mg | ORAL_TABLET | Freq: Every day | ORAL | 3 refills | Status: DC

## 2019-08-07 MED ORDER — HYDROCHLOROTHIAZIDE 25 MG PO TABS: 25.0000 mg | ORAL_TABLET | Freq: Every day | ORAL | 0 refills | Status: DC

## 2019-08-07 MED ORDER — GABAPENTIN 300 MG PO CAPS: ORAL_CAPSULE | ORAL | 0 refills | Status: DC

## 2019-08-07 MED ORDER — NOVOLOG FLEXPEN 100 UNIT/ML ~~LOC~~ SOPN: PEN_INJECTOR | SUBCUTANEOUS | 0 refills | Status: DC

## 2019-08-07 NOTE — Progress Notes (Signed)
Established Patient Office Visit  Subjective:  Patient ID: Courtney Stafford, female    DOB: [birth year]  Age: 47 y.o. MRN: [id number]  CC:  Chief Complaint  Patient presents with  . Diabetes  . Hyperlipidemia  . Hypertension    HPI Courtney Stafford presents for follow up of Type 1 DM, hypertension, hyperlipidemia, restless leg syndrome and  Medication refill. She states that she's compliant with her medications. She checks her blood pressure at home and brought her log to office visit. Her SBP ranges in the low to mid 120's and DBP is usually between 80-97. Her HgbA1c done on 06/27/2019 was 8.2%. She brought her blood glucose log during visit and her fasting readings from 07/21/19 - 08/03/19, ranges between 73-276. Her fasting readings are usually done between 11 am and 12 noon because she wakes up late. Her pre dinner readings are between 114- 308. Her bedtime readings are between 115- 283 mg/dl. She reports taking 25 units of Lantus at bedtime and Novolog Aspart sliding scale during meal time. She was evaluated via Tele health on 07/31/2019 by [facility] Endocrinologist  Dr Courtney Stafford and was advised on her bed time correction insulin, to administer 1 unit of Novolog if BG > 150-200, 2 units for BG 201-250.Marland Kitchen...etc).She has used the bedtime insulin correction scale once prior to her office visit. She denies hypoglycemic symptoms, but endorsees polyuria. She states that her peripheral neuropathy is moderately controlled by taking 1500 mg of gabapentin daily, but Dr Courtney Stafford suggested starting her on Cymbalta. She states that she has not had mammogram, pap smear nor colonoscopy done. She requests for Influenza and Pneumonia vaccines. She reports that she's doing well and denies further concerns.  Past Medical History:  Diagnosis Date  . Diabetes mellitus without complication ([place])   . Hypertension     Past Surgical History:  Procedure Laterality Date  . CARPAL TUNNEL RELEASE  6 years ago   both hands     Family History  Problem Relation Age of Onset  . Cancer Father     Social History   Socioeconomic History  . Marital status: Single    Spouse name: Not on file  . Number of children: Not on file  . Years of education: Not on file  . Highest education level: High school graduate  Occupational History  . Occupation: unemployed  Social Needs  . Financial resource strain: Not very hard  . Food insecurity    Worry: Sometimes true    Inability: Sometimes true  . Transportation needs    Medical: Yes    Non-medical: Yes  Tobacco Use  . Smoking status: Current Some Day Smoker    Packs/day: 0.10  . Smokeless tobacco: Former Network engineer and Sexual Activity  . Alcohol use: No  . Drug use: No  . Sexual activity: Not on file  Lifestyle  . Physical activity    Days per week: 0 days    Minutes per session: 0 min  . Stress: Rather much  Relationships  . Social Herbalist on phone: Three times a week    Gets together: Never    Attends religious service: Never    Active member of club or organization: No    Attends meetings of clubs or organizations: Never    Relationship status: Separated  . Intimate partner violence    Fear of current or ex partner: No    Emotionally abused: No    Physically abused: No  Forced sexual activity: No  Other Topics Concern  . Not on file  Social History Narrative   Completed biopsychosocial assessment, social determinants screening, administered PHQ 9, and GAD 7.       Transportation is a barrier to appointments. Receives food stamps 192 per month but worries about running out.       Support system is small.    Outpatient Medications Prior to Visit  Medication Sig Dispense Refill  . amLODipine (NORVASC) 10 MG tablet Take 1 tablet (10 mg total) by mouth daily. 90 tablet 4  . Blood Glucose Monitoring Suppl Supplies MISC 1 each by Does not apply route 5 (five) times daily. 500 each 4  . carvedilol (COREG) 12.5 MG tablet  Take 1 tablet (12.5 mg total) by mouth 2 (two) times daily with a meal. 60 tablet 2  . Insulin Glargine (LANTUS) 100 UNIT/ML Solostar Pen Inject 20-25 Units into the skin daily at 10 pm. (Patient taking differently: Inject 25 Units into the skin daily at 10 pm. ) 30 mL 4  . Insulin Pen Needle (BD PEN NEEDLE NANO U/F) 32G X 4 MM MISC 5 times daily 200 each 12  . lisinopril (PRINIVIL,ZESTRIL) 40 MG tablet Take 1 tablet (40 mg total) by mouth daily. 90 tablet 4  . mirtazapine (REMERON) 45 MG tablet Take 1 tablet (45 mg total) by mouth at bedtime. 30 tablet 2  . vitamin B-12 (CYANOCOBALAMIN) 500 MCG tablet Take 1 tablet (500 mcg total) by mouth daily. 30 tablet 3  . atorvastatin (LIPITOR) 10 MG tablet Take 1 tablet (10 mg total) by mouth daily. 30 tablet 3  . gabapentin (NEURONTIN) 300 MG capsule Take 1 capsule twice daily and 3 capsules at night. 150 capsule 0  . hydrochlorothiazide (HYDRODIURIL) 25 MG tablet Take 1 tablet (25 mg total) by mouth daily. 30 tablet 0  . NOVOLOG FLEXPEN 100 UNIT/ML FlexPen ADJUST UNITS BASED ON CARB INTAKE UP TO 20 UNITS PER DOSE. MAX DAILY DOSE IS 60 UNITS. (Patient taking differently: 5-8 Units 3 (three) times daily with meals. ) 75 mL 0   No facility-administered medications prior to visit.     Allergies  Allergen Reactions  . Phenergan [Promethazine] Hives    ROS Review of Systems  Constitutional: Negative.   Eyes: Negative.   Respiratory: Negative.   Cardiovascular: Negative.   Endocrine: Positive for polyuria.  Skin: Negative.   Neurological: Positive for numbness.  Psychiatric/Behavioral: Negative.       Objective:    Physical Exam  Constitutional: She is oriented to person, place, and time. She appears well-developed and well-nourished.  HENT:  Head: Normocephalic and atraumatic.  Eyes: Pupils are equal, round, and reactive to light. EOM are normal.  Cardiovascular: Normal rate and regular rhythm.  Pulmonary/Chest: Effort normal and breath  sounds normal.  Abdominal: Soft. Bowel sounds are normal.  Neurological: She is alert and oriented to person, place, and time.  Skin: Skin is warm and dry.  Psychiatric: She has a normal mood and affect. Her behavior is normal. Judgment and thought content normal.    BP (!) 138/93 (BP Location: Left Arm, Patient Position: Sitting)   Pulse 89   Temp 98.7 F (37.1 C)   Ht 5' (1.524 m)   Wt 205 lb (93 kg)   LMP 01/03/2016 (Within Years)   SpO2 96%   BMI 40.04 kg/m  Wt Readings from Last 3 Encounters:  08/07/19 205 lb (93 kg)  07/03/19 201 lb 1.6 oz (91.2 kg)  01/02/19 189 lb 4.8 oz (85.9 kg)   She was advised to continue on her weight loss regimen.  Health Maintenance Due  Topic Date Due  . PNEUMOCOCCAL POLYSACCHARIDE VACCINE AGE 37-64 HIGH RISK  07/05/1971  . HIV Screening  07/04/1984  . TETANUS/TDAP  07/04/1988  . PAP SMEAR-Modifier  07/04/1990  . OPHTHALMOLOGY EXAM  03/23/2019  . INFLUENZA VACCINE  06/16/2019  . MAMMOGRAM  07/05/2019  . COLONOSCOPY  07/05/2019    There are no preventive care reminders to display for this patient.  Lab Results  Component Value Date   TSH 0.693 06/27/2019   Lab Results  Component Value Date   WBC 9.9 06/27/2019   HGB 15.8 06/27/2019   HCT 49.3 (H) 06/27/2019   MCV 91 06/27/2019   PLT 340 06/27/2019   Lab Results  Component Value Date   NA 135 06/27/2019   [place] 4.3 06/27/2019   CO2 23 06/27/2019   GLUCOSE 399 (H) 06/27/2019   BUN 21 06/27/2019   CREATININE 1.02 (H) 06/27/2019   BILITOT 0.5 06/27/2019   ALKPHOS 114 06/27/2019   AST 13 06/27/2019   ALT 11 06/27/2019   PROT 6.8 06/27/2019   ALBUMIN 4.2 06/27/2019   CALCIUM 9.4 06/27/2019   ANIONGAP 9 02/12/2014   Lab Results  Component Value Date   CHOL 151 06/27/2019   Lab Results  Component Value Date   HDL 60 06/27/2019   Lab Results  Component Value Date   LDLCALC 68 06/27/2019   Lab Results  Component Value Date   TRIG 115 06/27/2019   Lab Results   Component Value Date   CHOLHDL 2.5 06/27/2019   Lab Results  Component Value Date   HGBA1C 8.2 (H) 06/27/2019      Assessment & Plan:    1. Essential hypertension -She will continue on current treatment regimen, blood pressure is controlled. -Low salt DASH diet -Take medications regularly on time -Exercise regularly as tolerated -Check blood pressure at least once a week at home,record and bring log to clinic visit. -Goal is less than 140/90 and normal blood pressure is 120/80 - hydrochlorothiazide (HYDRODIURIL) 25 MG tablet; Take 1 tablet (25 mg total) by mouth daily.  Dispense: 30 tablet; Refill: 0  2. Type 1 diabetes mellitus without complication (H[facility]) - She will continue on current treatment regimen. -Use Diabetic diet as advised  -Check blood sugar 3 times a day, write down the numbers against date in a log and bring log to clinic every visit -Take medications regularly as advised -Regular exercise as tolerated. - insulin aspart (NOVOLOG FLEXPEN) 100 UNIT/ML FlexPen; ADJUST UNITS BASED ON CARB INTAKE UP TO 20 UNITS PER DOSE. MAX DAILY DOSE IS 60 UNITS.  Dispense: 75 mL; Refill: 0  3. Restless legs - She will continue on Gabapentin, pending consulting with her Psychiatry Team due to drug interaction between Cymbalta and her scheduled Remeron. - Magnesium; Future - gabapentin (NEURONTIN) 300 MG capsule; Take 1 capsule twice daily and 3 capsules at night.  Dispense: 150 capsule; Refill: 0  4. Elevated lipids - She will continue on current treatment regimen, -Low fat Diet, like low fat dairy products eg skimmed milk -Avoid any fried food -Regular exercise/walk as tolerated -Goal for Total Cholesterol is less than 200 -Goal for bad cholesterol LDL is less than 100 -Goal for Good cholesterol HDL is more than 45 -Goal for Triglyceride is less than 150 - atorvastatin (LIPITOR) 10 MG tablet; Take 1 tablet (10 mg total) by mouth daily.  Dispense: 30 tablet; Refill: 3  5.  Generalized anxiety disorder - She will continue to follow up with Ms. Hilbert CorriganSimpson [facility] for her mental health care  6. Health care maintenance - She was encouraged to complete charity care application for Colonoscopy.  - Pneumococcal polysaccharide vaccine 23-valent greater than or equal to 2yo subcutaneous/IM - Ambulatory referral to Hematology / Oncology - Ambulatory referral to Gastroenterology - Flu Vaccine QUAD 6+ mos PF IM (Fluarix Quad PF)  7. Need for dental care - She was encouraged to complete dental application for referral to a Dentist.    Follow-up: Return in about 2 weeks (around 08/21/2019), or if symptoms worsen or fail to improve.    Madisyn Mawhinney Trellis PaganiniE Francesco Provencal, NP

## 2019-08-07 NOTE — Patient Instructions (Addendum)
Carbohydrate Counting for Diabetes Mellitus, Adult  Carbohydrate counting is a method of keeping track of how many carbohydrates you eat. Eating carbohydrates naturally increases the amount of sugar (glucose) in the blood. Counting how many carbohydrates you eat helps keep your blood glucose within normal limits, which helps you manage your diabetes (diabetes mellitus). It is important to know how many carbohydrates you can safely have in each meal. This is different for every person. A diet and nutrition specialist (registered dietitian) can help you make a meal plan and calculate how many carbohydrates you should have at each meal and snack. Carbohydrates are found in the following foods:  Grains, such as breads and cereals.  Dried beans and soy products.  Starchy vegetables, such as potatoes, peas, and corn.  Fruit and fruit juices.  Milk and yogurt.  Sweets and snack foods, such as cake, cookies, candy, chips, and soft drinks. How do I count carbohydrates? There are two ways to count carbohydrates in food. You can use either of the methods or a combination of both. Reading "Nutrition Facts" on packaged food The "Nutrition Facts" list is included on the labels of almost all packaged foods and beverages in the [place] It includes:  The serving size.  Information about nutrients in each serving, including the grams (g) of carbohydrate per serving. To use the "Nutrition Facts":  Decide how many servings you will have.  Multiply the number of servings by the number of carbohydrates per serving.  The resulting number is the total amount of carbohydrates that you will be having. Learning standard serving sizes of other foods When you eat carbohydrate foods that are not packaged or do not include "Nutrition Facts" on the label, you need to measure the servings in order to count the amount of carbohydrates:  Measure the foods that you will eat with a food scale or measuring cup, if needed.   Decide how many standard-size servings you will eat.  Multiply the number of servings by 15. Most carbohydrate-rich foods have about 15 g of carbohydrates per serving. ? For example, if you eat 8 oz (170 g) of strawberries, you will have eaten 2 servings and 30 g of carbohydrates (2 servings x 15 g = 30 g).  For foods that have more than one food mixed, such as soups and casseroles, you must count the carbohydrates in each food that is included. The following list contains standard serving sizes of common carbohydrate-rich foods. Each of these servings has about 15 g of carbohydrates:   hamburger bun or  English muffin.   oz (15 mL) syrup.   oz (14 g) jelly.  1 slice of bread.  1 six-inch tortilla.  3 oz (85 g) cooked rice or pasta.  4 oz (113 g) cooked dried beans.  4 oz (113 g) starchy vegetable, such as peas, corn, or potatoes.  4 oz (113 g) hot cereal.  4 oz (113 g) mashed potatoes or  of a large baked potato.  4 oz (113 g) canned or frozen fruit.  4 oz (120 mL) fruit juice.  4-6 crackers.  6 chicken nuggets.  6 oz (170 g) unsweetened dry cereal.  6 oz (170 g) plain fat-free yogurt or yogurt sweetened with artificial sweeteners.  8 oz (240 mL) milk.  8 oz (170 g) fresh fruit or one small piece of fruit.  24 oz (680 g) popped popcorn. Example of carbohydrate counting Sample meal  3 oz (85 g) chicken breast.  6 oz (170 g)   brown rice.  4 oz (113 g) corn.  8 oz (240 mL) milk.  8 oz (170 g) strawberries with sugar-free whipped topping. Carbohydrate calculation 1. Identify the foods that contain carbohydrates: ? Rice. ? Corn. ? Milk. ? Strawberries. 2. Calculate how many servings you have of each food: ? 2 servings rice. ? 1 serving corn. ? 1 serving milk. ? 1 serving strawberries. 3. Multiply each number of servings by 15 g: ? 2 servings rice x 15 g = 30 g. ? 1 serving corn x 15 g = 15 g. ? 1 serving milk x 15 g = 15 g. ? 1 serving  strawberries x 15 g = 15 g. 4. Add together all of the amounts to find the total grams of carbohydrates eaten: ? 30 g + 15 g + 15 g + 15 g = 75 g of carbohydrates total. Summary  Carbohydrate counting is a method of keeping track of how many carbohydrates you eat.  Eating carbohydrates naturally increases the amount of sugar (glucose) in the blood.  Counting how many carbohydrates you eat helps keep your blood glucose within normal limits, which helps you manage your diabetes.  A diet and nutrition specialist (registered dietitian) can help you make a meal plan and calculate how many carbohydrates you should have at each meal and snack. This information is not intended to replace advice given to you by your health care provider. Make sure you discuss any questions you have with your health care provider. Document Released: 11/01/2005 Document Revised: 05/26/2017 Document Reviewed: 04/14/2016 Elsevier Patient Education  [place] on Night time Sliding scale with Novolog Blood glucose > 150-200 Take 1 unit                             201-250 Take 2 units                             251-300 Take 3 units                             301- 350 Take 4 units                             351- 400 Take 5 untis

## 2019-08-09 ENCOUNTER — Telehealth: Payer: Self-pay | Admitting: Pharmacist

## 2019-08-09 NOTE — Telephone Encounter (Signed)
08/09/2019 9:35:19 AM - Novolog Flexpen & tips refill  08/09/2019 Per [name] @ [facility] Dr. Mable Fill is out off office and [name] is willing to sign forms, I changed provider and [name] signed refill request. Faxed REFILL request to [employer] for [employer] Max daily dose 60 unit (Adjust units based on carb intake up to 20 units per dose-20 units three times a day) #5 & Novofine 32G tips #4.Courtney Stafford

## 2019-08-10 ENCOUNTER — Other Ambulatory Visit: Payer: Self-pay

## 2019-08-10 DIAGNOSIS — G2581 Restless legs syndrome: Secondary | ICD-10-CM

## 2019-08-10 MED ORDER — GABAPENTIN 300 MG PO CAPS: ORAL_CAPSULE | ORAL | 0 refills | Status: DC

## 2019-08-14 ENCOUNTER — Other Ambulatory Visit: Payer: Self-pay

## 2019-08-14 ENCOUNTER — Ambulatory Visit: Payer: Self-pay | Admitting: Licensed Clinical Social Worker

## 2019-08-14 DIAGNOSIS — F331 Major depressive disorder, recurrent, moderate: Secondary | ICD-10-CM

## 2019-08-14 DIAGNOSIS — F4310 Post-traumatic stress disorder, unspecified: Secondary | ICD-10-CM

## 2019-08-14 DIAGNOSIS — F4321 Adjustment disorder with depressed mood: Secondary | ICD-10-CM

## 2019-08-14 DIAGNOSIS — F411 Generalized anxiety disorder: Secondary | ICD-10-CM

## 2019-08-14 MED ORDER — MIRTAZAPINE 30 MG PO TABS: 60.0000 mg | ORAL_TABLET | Freq: Every day | ORAL | 0 refills | Status: DC

## 2019-08-14 NOTE — BH Specialist Note (Signed)
Integrated Behavioral Health Follow Up Visit Via Phone  MRN: [id number] Name: Courtney Stafford  Type of Service: [place] Interpretor:No. Interpretor Name and Language: not applicable.   SUBJECTIVE: Courtney Stafford is a 48 y.o. female accompanied by herself.  Patient was referred by endocrinology group for mental health.  Patient reports the following symptoms/concerns: She notes that she is confused about what she is saying versus what [name] told her last time. She notes that she has tried to get in touch with [employer] about volunteering but no one has answered the phone and there is no option to leave a voicemail. She notes that she is a little nervous about being around a lot people right now. She explained that she is frustrated with not being able to sleep at night because of her restless leg. She denies suicidal and homicidal thoughts.  Duration of problem: ; Severity of problem: moderate  OBJECTIVE: Mood: Euthymic and Affect: Appropriate Risk of harm to self or others: No plan to harm self or others  LIFE CONTEXT: Family and Social: see above. School/Work: see above. Self-Care: see above. Life Changes: see above.  GOALS ADDRESSED: Patient will: 1.  Reduce symptoms of: insomnia  2.  Increase knowledge and/or ability of: self-management skills and stress reduction  3.  Demonstrate ability to: Increase healthy adjustment to current life circumstances  INTERVENTIONS: Interventions utilized:  Brief CBT was utilized by the clinician focusing on the patient's insomnia and behavioral activation. Clinician processed with the patient regarding how she has been doing since the last follow up session. Clinician explained to the patient that she had a case consultation with Dr. Octavia Heir, MD, psychiatric consultant who recommended increasing the dosage of Mirtazapine to 60 mg at bedtime and increasing the dosage of Gabapentin. Clinician explained  to the patient that if this does not work, that she will be weaned off of the Mirtazapine and Cymbalta will be tried in its place to assist with her neuropathic pain, anxiety, depression, and insomnia. Clinician provided the patient with instructions for contacting [employer], suggested she go in person, and which bus she needs to take. Clinician encouraged the patient to continue to try walking at least twice a day for ten to fifteen minutes and establish a routine.  Standardized Assessments completed: GAD-7 and PHQ 9  ASSESSMENT: Patient currently experiencing see above.  Patient may benefit from see above.  PLAN: 1. Follow up with behavioral health clinician on : two weeks or earlier if needed. 2. Behavioral recommendations: see above. 3. Referral(s): [facility] (In Clinic) 4. "From scale of 1-10, how likely are you to follow plan?":   Bayard Hugger, LCSW

## 2019-08-15 ENCOUNTER — Other Ambulatory Visit: Payer: Self-pay

## 2019-08-15 DIAGNOSIS — G2581 Restless legs syndrome: Secondary | ICD-10-CM

## 2019-08-15 MED ORDER — GABAPENTIN 300 MG PO CAPS: ORAL_CAPSULE | ORAL | 0 refills | Status: DC

## 2019-08-23 ENCOUNTER — Ambulatory Visit: Payer: Self-pay | Admitting: Gerontology

## 2019-08-23 ENCOUNTER — Telehealth: Payer: Self-pay | Admitting: Pharmacist

## 2019-08-23 ENCOUNTER — Telehealth: Payer: Self-pay

## 2019-08-23 NOTE — Telephone Encounter (Signed)
-----   Message from Elenore Rota sent at 08/23/2019  1:12 PM EDT ----- Regarding: Dental Referral Update Earnest Bailey,  Miss Roesler was wanting an update about her Dental referral. She has not heard back from the dentist office yet. She would like a phone call if possible.  Apolonio Schneiders

## 2019-08-23 NOTE — Telephone Encounter (Signed)
08/23/2019 11:02:34 AM - Faxed Novo clear directions on Novolog Flexpens  08/23/2019 I have faxed [employer] reorder pages for [employer] Inject 20 units three times a day, max daily dose 60 units, #5 boxes, also Novofine 32G tips use daily with Novolog Flexpen #4 boxes, per [employer] request, I had received a notice for "sig/dosing instructions are missing or illegible.".AJ   08/23/2019 10:59:00 AM - Lantus vials refill to [employer]  08/23/2019 Faxed [employer] refill request for Lantus Vials Inject 20 units daily at bedtime #4.Delos Haring

## 2019-08-23 NOTE — Telephone Encounter (Signed)
Spoke with [employer] with [facility] to check on referral status for Courtney Stafford.  Confirmed that they did receive referral.  Lexine Baton stated the reason patient hadn't been contacted was due to not having any recent openings. Lexine Baton will reach out to patient to get something scheduled.    Called and left message with patient to inform her dental referral sent and received and that their office would be reaching out to her soon to get appointment scheduled.   Encouraged her to call dentist office directly if she had not heard back from them after a few days and to call [facility] should she have any further questions regarding her referral.

## 2019-08-24 ENCOUNTER — Other Ambulatory Visit: Payer: Self-pay

## 2019-08-24 ENCOUNTER — Telehealth: Payer: Self-pay

## 2019-08-24 NOTE — Telephone Encounter (Signed)
Pre-visit screening call attempted prior to [facility] appointment on 08/29/2019. No anser / left a msg

## 2019-08-28 ENCOUNTER — Ambulatory Visit: Payer: Self-pay | Admitting: Licensed Clinical Social Worker

## 2019-08-28 ENCOUNTER — Other Ambulatory Visit: Payer: Self-pay

## 2019-08-28 ENCOUNTER — Ambulatory Visit: Payer: Self-pay | Admitting: Gerontology

## 2019-08-28 ENCOUNTER — Encounter: Payer: Self-pay | Admitting: Gerontology

## 2019-08-28 VITALS — BP 124/84 | HR 91 | Temp 99.5°F | Wt 208.0 lb

## 2019-08-28 DIAGNOSIS — F4321 Adjustment disorder with depressed mood: Secondary | ICD-10-CM

## 2019-08-28 DIAGNOSIS — Z634 Disappearance and death of family member: Secondary | ICD-10-CM

## 2019-08-28 DIAGNOSIS — G4700 Insomnia, unspecified: Secondary | ICD-10-CM | POA: Insufficient documentation

## 2019-08-28 DIAGNOSIS — G2581 Restless legs syndrome: Secondary | ICD-10-CM

## 2019-08-28 DIAGNOSIS — F331 Major depressive disorder, recurrent, moderate: Secondary | ICD-10-CM

## 2019-08-28 DIAGNOSIS — F411 Generalized anxiety disorder: Secondary | ICD-10-CM

## 2019-08-28 DIAGNOSIS — F4310 Post-traumatic stress disorder, unspecified: Secondary | ICD-10-CM

## 2019-08-28 MED ORDER — GABAPENTIN 400 MG PO CAPS: ORAL_CAPSULE | ORAL | 0 refills | Status: DC

## 2019-08-28 MED ORDER — GABAPENTIN 300 MG PO CAPS: ORAL_CAPSULE | ORAL | 0 refills | Status: DC

## 2019-08-28 NOTE — Patient Instructions (Signed)

## 2019-08-28 NOTE — BH Specialist Note (Signed)
Integrated Behavioral Health Follow Up Visit Via Phone  MRN: [id number] Name: Courtney Stafford  Type of Service: [place] Interpretor:No. Interpretor Name and Language: not applicable.   SUBJECTIVE: Courtney Stafford is a 48 y.o. female accompanied by herself.  Patient was referred by endocrinology group for mental health.  Patient reports the following symptoms/concerns: She notes that she has noticed a difference in her sleep since starting the Mirtazapine. She notes that she saw [name] NP for her restless leg and she increased her Gabapentin to 300 mg twice daily and three capsules at night. She notes that she has not been getting an after care visit summary. She notes that she is hoping that it works because she is frustrated and aggravated with not being able to sleep plus the restless leg. She notes that she is irritable a a lot due to the lack of sleep. She notes that her daughter had her baby this past weekend a week earlier. She notes that she has continued to try to call [employer] but cannot get anyone to answer the phone. She notes that [employer] has partially reopened but the coordinator said she is not sure when the volunteer program will resume. She explains that she is looking forward to being able to volunteer at [employer] again. She notes that she is a little nervous about having her mammogram tomorrow and hopes that they do not find anything wrong with her. She explains that she plans to go early because she is not sure where it is at over at the hospital and does not want to be late. She notes that she has been walking a little bit and so far has walked twice this week. She notes that her friend is talking to her again and is helping her out with stuff again. She denies suicidal and homicidal thoughts.  Duration of problem: ; Severity of problem: moderate  OBJECTIVE: Mood: Euthymic and Affect: Appropriate Risk of harm to self  or others: No plan to harm self or others  LIFE CONTEXT: Family and Social: see above. School/Work: see above Self-Care: see above Life Changes: see above  GOALS ADDRESSED: Patient will: 1.  Reduce symptoms of: anxiety, depression and insomnia  2.  Increase knowledge and/or ability of: self-management skills and behavioral activation  3.  Demonstrate ability to: Increase healthy adjustment to current life circumstances  INTERVENTIONS: Interventions utilized:  Brief CBT was utilized by the clinician discussing behavioral activation. Clinician processed with the patient regarding how she has been doing since the last follow up session. Clinician reiterated the original plan of increasing her Mirtazapine to 60 mg at bedtime and increasing in the dosage of Gabapentin. Clinician explained to the patient that if that does not seem to help with the combination of her mental health symptoms and restless leg, she would be weaned off of Mirtazapine and prescribed Cymbalta in its place. Clinician explained to the patient that she has to allow the medication to fully get into her system and that she cannot rely on the medication alone. Clinician asked the patient if [name] NP went over any things that she can do on her own to remedy her symptoms of restless leg. Clinician explained to the patient that she would mail her after care summary to her regarding her visit with the nurse practitioner. Clinician encouraged the patient to write things down when she speaks with any provider so she does not forget and try to keep it in  the same place. Clinician asked the patient if she followed up with [employer] in regards to volunteering. Clinician encouraged the patient to be a little bit more physically active, taking walks at least ten to fifteen minutes twice a day.  Standardized Assessments completed: GAD-7 and PHQ 9  ASSESSMENT: Patient currently experiencing see above.  Patient may benefit from  see above.  PLAN: 1. Follow up with behavioral health clinician on : two weeks or earlier if needed. 2. Behavioral recommendations: see above 3. Referral(s): [facility] (In Clinic) 4. "From scale of 1-10, how likely are you to follow plan?":   Althia Forts, LCSW

## 2019-08-28 NOTE — Progress Notes (Signed)
Established Patient Office Visit  Subjective:  Patient ID: Courtney Stafford, female    DOB: 06-23-1971  Age: 48 y.o. MRN: [id number]  CC:  Chief Complaint  Patient presents with  . Diabetes    HPI Courtney Stafford presents for follow up of Restless leg syndrome and Insomnia. She reports that taking 300 mg Gabapentin bid and 900 mg at bedtime is not relieving her restless leg symptoms. She reports going to bed at 10 pm, and  wakes up around 11 am. She states that she can't fall asleep because her legs kept moving. She states that her Mirtazapine was increased to 60 mg and  she's not getting enough sleep.  She reports that she's doing well and offers no further concerns.  Past Medical History:  Diagnosis Date  . Diabetes mellitus without complication (HCC)   . Hypertension     Past Surgical History:  Procedure Laterality Date  . CARPAL TUNNEL RELEASE  6 years ago   both hands    Family History  Problem Relation Age of Onset  . Cancer Father     Social History   Socioeconomic History  . Marital status: Single    Spouse name: Not on file  . Number of children: Not on file  . Years of education: Not on file  . Highest education level: High school graduate  Occupational History  . Occupation: unemployed  Social Needs  . Financial resource strain: Not very hard  . Food insecurity    Worry: Sometimes true    Inability: Sometimes true  . Transportation needs    Medical: Yes    Non-medical: Yes  Tobacco Use  . Smoking status: Current Some Day Smoker    Packs/day: 0.10  . Smokeless tobacco: Former Engineer, water and Sexual Activity  . Alcohol use: No  . Drug use: No  . Sexual activity: Not on file  Lifestyle  . Physical activity    Days per week: 0 days    Minutes per session: 0 min  . Stress: Rather much  Relationships  . Social Musician on phone: Three times a week    Gets together: Never    Attends religious service: Never    Active member of  club or organization: No    Attends meetings of clubs or organizations: Never    Relationship status: Separated  . Intimate partner violence    Fear of current or ex partner: No    Emotionally abused: No    Physically abused: No    Forced sexual activity: No  Other Topics Concern  . Not on file  Social History Narrative   Completed biopsychosocial assessment, social determinants screening, administered PHQ 9, and GAD 7.       Transportation is a barrier to appointments. Receives food stamps 192 per month but worries about running out.       Support system is small.    Outpatient Medications Prior to Visit  Medication Sig Dispense Refill  . amLODipine (NORVASC) 10 MG tablet Take 1 tablet (10 mg total) by mouth daily. 90 tablet 4  . atorvastatin (LIPITOR) 10 MG tablet Take 1 tablet (10 mg total) by mouth daily. 30 tablet 3  . Blood Glucose Monitoring Suppl Supplies MISC 1 each by Does not apply route 5 (five) times daily. 500 each 4  . carvedilol (COREG) 12.5 MG tablet Take 1 tablet (12.5 mg total) by mouth 2 (two) times daily with a meal. 60 tablet  2  . hydrochlorothiazide (HYDRODIURIL) 25 MG tablet Take 1 tablet (25 mg total) by mouth daily. 30 tablet 0  . insulin aspart (NOVOLOG FLEXPEN) 100 UNIT/ML FlexPen ADJUST UNITS BASED ON CARB INTAKE UP TO 20 UNITS PER DOSE. MAX DAILY DOSE IS 60 UNITS. 75 mL 0  . Insulin Glargine (LANTUS) 100 UNIT/ML Solostar Pen Inject 20-25 Units into the skin daily at 10 pm. (Patient taking differently: Inject 25 Units into the skin daily at 10 pm. ) 30 mL 4  . Insulin Pen Needle (BD PEN NEEDLE NANO U/F) 32G X 4 MM MISC 5 times daily 200 each 12  . lisinopril (PRINIVIL,ZESTRIL) 40 MG tablet Take 1 tablet (40 mg total) by mouth daily. 90 tablet 4  . mirtazapine (REMERON) 30 MG tablet Take 2 tablets (60 mg total) by mouth at bedtime. 60 tablet 0  . vitamin B-12 (CYANOCOBALAMIN) 500 MCG tablet Take 1 tablet (500 mcg total) by mouth daily. 30 tablet 3  .  gabapentin (NEURONTIN) 300 MG capsule Take 1 capsule twice daily and 3 capsules at night. 45 capsule 0   No facility-administered medications prior to visit.     Allergies  Allergen Reactions  . Phenergan [Promethazine] Hives    ROS Review of Systems  Constitutional: Negative.   Eyes: Negative.   Respiratory: Negative.   Cardiovascular: Negative.   Endocrine: Negative.   Musculoskeletal:       Restless leg syndrome   Skin: Negative.   Neurological: Positive for numbness (peripheral neuropathy).  Psychiatric/Behavioral: Positive for sleep disturbance.      Objective:    Physical Exam  Constitutional: She is oriented to person, place, and time. She appears well-developed and well-nourished.  HENT:  Head: Normocephalic and atraumatic.  Eyes: Pupils are equal, round, and reactive to light. EOM are normal.  Cardiovascular: Normal rate and regular rhythm.  Pulmonary/Chest: Effort normal and breath sounds normal.  Neurological: She is oriented to person, place, and time.  Skin: Skin is warm and dry.  Psychiatric: She has a normal mood and affect. Her behavior is normal. Judgment and thought content normal.    BP 124/84   Pulse 91   Temp 99.5 F (37.5 C)   Wt 208 lb (94.3 kg)   LMP 01/03/2016 (Within Years)   SpO2 95%   BMI 40.62 kg/m  Wt Readings from Last 3 Encounters:  08/28/19 208 lb (94.3 kg)  08/07/19 205 lb (93 kg)  07/03/19 201 lb 1.6 oz (91.2 kg)     Health Maintenance Due  Topic Date Due  . HIV Screening  07/04/1984  . TETANUS/TDAP  07/04/1988  . PAP SMEAR-Modifier  07/04/1990  . OPHTHALMOLOGY EXAM  03/23/2019  . MAMMOGRAM  07/05/2019  . COLONOSCOPY  07/05/2019    There are no preventive care reminders to display for this patient.  Lab Results  Component Value Date   TSH 0.693 06/27/2019   Lab Results  Component Value Date   WBC 9.9 06/27/2019   HGB 15.8 06/27/2019   HCT 49.3 (H) 06/27/2019   MCV 91 06/27/2019   PLT 340 06/27/2019    Lab Results  Component Value Date   NA 135 06/27/2019   K 4.3 06/27/2019   CO2 23 06/27/2019   GLUCOSE 399 (H) 06/27/2019   BUN 21 06/27/2019   CREATININE 1.02 (H) 06/27/2019   BILITOT 0.5 06/27/2019   ALKPHOS 114 06/27/2019   AST 13 06/27/2019   ALT 11 06/27/2019   PROT 6.8 06/27/2019   ALBUMIN 4.2 06/27/2019  CALCIUM 9.4 06/27/2019   ANIONGAP 9 02/12/2014   Lab Results  Component Value Date   CHOL 151 06/27/2019   Lab Results  Component Value Date   HDL 60 06/27/2019   Lab Results  Component Value Date   LDLCALC 68 06/27/2019   Lab Results  Component Value Date   TRIG 115 06/27/2019   Lab Results  Component Value Date   CHOLHDL 2.5 06/27/2019   Lab Results  Component Value Date   HGBA1C 8.2 (H) 06/27/2019      Assessment & Plan:     1. Restless legs - She will continue on 600 mg Gabapentin bid and 900 mg at bedtime.  - gabapentin (NEURONTIN) 300 MG capsule; Take 2 capsule twice daily and 3 capsules at night.  Dispense: 98 capsule; Refill: 0  2. Insomnia, unspecified type - She will continue on 60 mg Mirtazapine for insomnia until she follows up with Ms. Simpson for mental health care. -Advised patient to avoid caffeine/alcohol/stimulants before going to bed. -Attempt to sleep at the same time each night -Do not watch TV while trying to fall asleep and use sleep medications if prescribed by primary care provider.    Follow-up: Return in about 2 weeks (around 09/11/2019), or if symptoms worsen or fail to improve.    Cadence Minton Jerold Coombe, NP

## 2019-08-29 ENCOUNTER — Ambulatory Visit: Payer: Self-pay

## 2019-09-04 ENCOUNTER — Encounter: Payer: Self-pay | Admitting: *Deleted

## 2019-09-06 ENCOUNTER — Telehealth: Payer: Self-pay | Admitting: Gerontology

## 2019-09-06 NOTE — Telephone Encounter (Signed)
Called 10/22 for upcoming 10/27 appt. No answer and no ROI on file so did not lvm.

## 2019-09-11 ENCOUNTER — Ambulatory Visit: Payer: Self-pay | Admitting: Licensed Clinical Social Worker

## 2019-09-11 ENCOUNTER — Ambulatory Visit: Payer: Self-pay | Admitting: Gerontology

## 2019-09-11 ENCOUNTER — Other Ambulatory Visit: Payer: Self-pay

## 2019-09-11 DIAGNOSIS — F411 Generalized anxiety disorder: Secondary | ICD-10-CM

## 2019-09-11 DIAGNOSIS — F331 Major depressive disorder, recurrent, moderate: Secondary | ICD-10-CM

## 2019-09-11 DIAGNOSIS — F4310 Post-traumatic stress disorder, unspecified: Secondary | ICD-10-CM

## 2019-09-11 DIAGNOSIS — E109 Type 1 diabetes mellitus without complications: Secondary | ICD-10-CM

## 2019-09-11 DIAGNOSIS — G2581 Restless legs syndrome: Secondary | ICD-10-CM

## 2019-09-11 MED ORDER — INSULIN GLARGINE 100 UNIT/ML SOLOSTAR PEN: 30.0000 [IU] | PEN_INJECTOR | Freq: Every day | SUBCUTANEOUS | 4 refills | Status: DC

## 2019-09-11 NOTE — BH Specialist Note (Signed)
Integrated Behavioral Health Follow Up Visit Via Phone  MRN: [id number] Name: Courtney Stafford  Type of Service: [place] Interpretor:No. Interpretor Name and Language: Not applicable.   SUBJECTIVE: Courtney Stafford is a 48 y.o. female accompanied by herself. Patient was referred by the endocrinology clinic for mental health. Patient reports the following symptoms/concerns: She explains that she is still having issues with restless leg, cannot sleep, and feels aggravated most of the time. She notes that she has been walking a little bit as often as she can to see if that helps with her restless leg. She explains that she feels like the increase in Gabapentin is not helping. She asked what the second plan was for dealing with her issues with restless leg, depression, anxiety, and insomnia. She notes that [employer] is open to the public but volunteer services has yet to start back up. She notes that she feels like since the Mirtazapine was increased that she has yet to notice any changes in her symptoms of anxiety and depression. She reports that she will be visiting her daughter next weekend for an early Thanksgiving and is looking forward to it. She notes that she is excited to meet her new grandson and spend some time with her older grandson. She denies suicidal and homicidal thoughts.  Duration of problem: ; Severity of problem: moderate  OBJECTIVE: Mood: Euthymic and Affect: Appropriate Risk of harm to self or others: No plan to harm self or others  LIFE CONTEXT: Family and Social: See above. School/Work: See above. Self-Care: See above. Life Changes: See above.   GOALS ADDRESSED: Patient will: 1.  Reduce symptoms of: anxiety, depression and insomnia  2.  Increase knowledge and/or ability of: coping skills, healthy habits and stress reduction  3.  Demonstrate ability to: Increase healthy adjustment to current life  circumstances  INTERVENTIONS: Interventions utilized:  Brief CBT was utilized by the clinician focusing on the patient's mood and affect on her behavior. Clinician processed with the patient regarding how she has been doing since the last follow up session. Clinician explained to the patient that she would have a case consultation with Dr. Octavia Heir, MD, psychiatric consultant on Tuesday November 3rd @ 9 am to discuss the option of the secondary plan if the increase in Gabapentin and Mirtazapine did not work. Clinician asked the patient if she has noticed any changes in terms of her mood and anxiety symptoms. Clinician encouraged the patient to increase her walking a little bit. Clinician explained to the patient that she thinks she should take advantage of being able to go to [employer] to check out books, DVD, and have somewhere to go to get out of the house until she can volunteer again. Clinician explained to the patient that she is glad that she has something to look forward to in the next two weeks.  Standardized Assessments completed: GAD-7 and PHQ 9  ASSESSMENT: Patient currently experiencing see above.  Patient may benefit from see above.  PLAN: 1. Follow up with behavioral health clinician on : two weeks or earlier if needed. 2. Behavioral recommendations: Case consultation with Dr. Octavia Heir, MD, psychiatric consultant on Tuesday November 3rd @ 9 am to discuss psychotropic medications.  3. Referral(s): [place] (In Clinic) 4. "From scale of 1-10, how likely are you to follow plan?":   Bayard Hugger, LCSW

## 2019-09-11 NOTE — Patient Instructions (Signed)
Carbohydrate Counting for Diabetes Mellitus, Adult  Carbohydrate counting is a method of keeping track of how many carbohydrates you eat. Eating carbohydrates naturally increases the amount of sugar (glucose) in the blood. Counting how many carbohydrates you eat helps keep your blood glucose within normal limits, which helps you manage your diabetes (diabetes mellitus). It is important to know how many carbohydrates you can safely have in each meal. This is different for every person. A diet and nutrition specialist (registered dietitian) can help you make a meal plan and calculate how many carbohydrates you should have at each meal and snack. Carbohydrates are found in the following foods:  Grains, such as breads and cereals.  Dried beans and soy products.  Starchy vegetables, such as potatoes, peas, and corn.  Fruit and fruit juices.  Milk and yogurt.  Sweets and snack foods, such as cake, cookies, candy, chips, and soft drinks. How do I count carbohydrates? There are two ways to count carbohydrates in food. You can use either of the methods or a combination of both. Reading "Nutrition Facts" on packaged food The "Nutrition Facts" list is included on the labels of almost all packaged foods and beverages in the [place] It includes:  The serving size.  Information about nutrients in each serving, including the grams (g) of carbohydrate per serving. To use the "Nutrition Facts":  Decide how many servings you will have.  Multiply the number of servings by the number of carbohydrates per serving.  The resulting number is the total amount of carbohydrates that you will be having. Learning standard serving sizes of other foods When you eat carbohydrate foods that are not packaged or do not include "Nutrition Facts" on the label, you need to measure the servings in order to count the amount of carbohydrates:  Measure the foods that you will eat with a food scale or measuring cup, if needed.   Decide how many standard-size servings you will eat.  Multiply the number of servings by 15. Most carbohydrate-rich foods have about 15 g of carbohydrates per serving. ? For example, if you eat 8 oz (170 g) of strawberries, you will have eaten 2 servings and 30 g of carbohydrates (2 servings x 15 g = 30 g).  For foods that have more than one food mixed, such as soups and casseroles, you must count the carbohydrates in each food that is included. The following list contains standard serving sizes of common carbohydrate-rich foods. Each of these servings has about 15 g of carbohydrates:   hamburger bun or  English muffin.   oz (15 mL) syrup.   oz (14 g) jelly.  1 slice of bread.  1 six-inch tortilla.  3 oz (85 g) cooked rice or pasta.  4 oz (113 g) cooked dried beans.  4 oz (113 g) starchy vegetable, such as peas, corn, or potatoes.  4 oz (113 g) hot cereal.  4 oz (113 g) mashed potatoes or  of a large baked potato.  4 oz (113 g) canned or frozen fruit.  4 oz (120 mL) fruit juice.  4-6 crackers.  6 chicken nuggets.  6 oz (170 g) unsweetened dry cereal.  6 oz (170 g) plain fat-free yogurt or yogurt sweetened with artificial sweeteners.  8 oz (240 mL) milk.  8 oz (170 g) fresh fruit or one small piece of fruit.  24 oz (680 g) popped popcorn. Example of carbohydrate counting Sample meal  3 oz (85 g) chicken breast.  6 oz (170 g)   brown rice.  4 oz (113 g) corn.  8 oz (240 mL) milk.  8 oz (170 g) strawberries with sugar-free whipped topping. Carbohydrate calculation 1. Identify the foods that contain carbohydrates: ? Rice. ? Corn. ? Milk. ? Strawberries. 2. Calculate how many servings you have of each food: ? 2 servings rice. ? 1 serving corn. ? 1 serving milk. ? 1 serving strawberries. 3. Multiply each number of servings by 15 g: ? 2 servings rice x 15 g = 30 g. ? 1 serving corn x 15 g = 15 g. ? 1 serving milk x 15 g = 15 g. ? 1 serving  strawberries x 15 g = 15 g. 4. Add together all of the amounts to find the total grams of carbohydrates eaten: ? 30 g + 15 g + 15 g + 15 g = 75 g of carbohydrates total. Summary  Carbohydrate counting is a method of keeping track of how many carbohydrates you eat.  Eating carbohydrates naturally increases the amount of sugar (glucose) in the blood.  Counting how many carbohydrates you eat helps keep your blood glucose within normal limits, which helps you manage your diabetes.  A diet and nutrition specialist (registered dietitian) can help you make a meal plan and calculate how many carbohydrates you should have at each meal and snack. This information is not intended to replace advice given to you by your health care provider. Make sure you discuss any questions you have with your health care provider. Document Released: 11/01/2005 Document Revised: 05/26/2017 Document Reviewed: 04/14/2016 Elsevier Patient Education  2020 [employer]  

## 2019-09-11 NOTE — Progress Notes (Signed)
Established Patient Office Visit  Subjective:  Patient ID: Courtney Stafford, female    DOB: 1971/03/06  Age: 48 y.o. MRN: [id number]  CC: No chief complaint on file. Patient consents to telephone visit and 2 patients identifiers was used to identify patient.  HPI Courtney Garibaldingela M Mollenkopf presents for follow up of Restless leg syndrome, Type 1 Diabetes Mellitus. She states that taking 600 mg gabapentin bid and 900 mg at bedtime doesn't relieve her restless leg symptoms.  She reports that she goes to bed at 10 pm, her legs continue to move and she finally sleeps after 2 hours. She reports that she wakes up between 10 and 11 am. Her HgbA1c done 2 months ago was 8.2%, she reports that she's compliant with her medications and checks her blood glucose twice daily. She states that her fasting blood glucose has being between 192 mg/dl to 956234 mg/dl. She takes 20-25 units of Lantus qhs and Aspart sliding scale. She denies hypoglycemic symptoms, but endorses polyuria, polydipsia and polyphagia. She denies chest pain, palpitation, fever, chills and she offers no further concerns.  Past Medical History:  Diagnosis Date  . Diabetes mellitus without complication (HCC)   . Hypertension     Past Surgical History:  Procedure Laterality Date  . CARPAL TUNNEL RELEASE  6 years ago   both hands    Family History  Problem Relation Age of Onset  . Cancer Father     Social History   Socioeconomic History  . Marital status: Single    Spouse name: Not on file  . Number of children: Not on file  . Years of education: Not on file  . Highest education level: High school graduate  Occupational History  . Occupation: unemployed  Social Needs  . Financial resource strain: Not very hard  . Food insecurity    Worry: Sometimes true    Inability: Sometimes true  . Transportation needs    Medical: Yes    Non-medical: Yes  Tobacco Use  . Smoking status: Current Some Day Smoker    Packs/day: 0.10  . Smokeless  tobacco: Former Engineer, waterUser  Substance and Sexual Activity  . Alcohol use: No  . Drug use: No  . Sexual activity: Not on file  Lifestyle  . Physical activity    Days per week: 0 days    Minutes per session: 0 min  . Stress: Rather much  Relationships  . Social Musicianconnections    Talks on phone: Three times a week    Gets together: Never    Attends religious service: Never    Active member of club or organization: No    Attends meetings of clubs or organizations: Never    Relationship status: Separated  . Intimate partner violence    Fear of current or ex partner: No    Emotionally abused: No    Physically abused: No    Forced sexual activity: No  Other Topics Concern  . Not on file  Social History Narrative   Completed biopsychosocial assessment, social determinants screening, administered PHQ 9, and GAD 7.       Transportation is a barrier to appointments. Receives food stamps 192 per month but worries about running out.       Support system is small.    Outpatient Medications Prior to Visit  Medication Sig Dispense Refill  . amLODipine (NORVASC) 10 MG tablet Take 1 tablet (10 mg total) by mouth daily. 90 tablet 4  . atorvastatin (LIPITOR) 10 MG tablet  Take 1 tablet (10 mg total) by mouth daily. 30 tablet 3  . Blood Glucose Monitoring Suppl Supplies MISC 1 each by Does not apply route 5 (five) times daily. 500 each 4  . carvedilol (COREG) 12.5 MG tablet Take 1 tablet (12.5 mg total) by mouth 2 (two) times daily with a meal. 60 tablet 2  . gabapentin (NEURONTIN) 300 MG capsule Take 2 capsule twice daily and 3 capsules at night. 98 capsule 0  . hydrochlorothiazide (HYDRODIURIL) 25 MG tablet Take 1 tablet (25 mg total) by mouth daily. 30 tablet 0  . insulin aspart (NOVOLOG FLEXPEN) 100 UNIT/ML FlexPen ADJUST UNITS BASED ON CARB INTAKE UP TO 20 UNITS PER DOSE. MAX DAILY DOSE IS 60 UNITS. 75 mL 0  . Insulin Pen Needle (BD PEN NEEDLE NANO U/F) 32G X 4 MM MISC 5 times daily 200 each 12  .  lisinopril (PRINIVIL,ZESTRIL) 40 MG tablet Take 1 tablet (40 mg total) by mouth daily. 90 tablet 4  . mirtazapine (REMERON) 30 MG tablet Take 2 tablets (60 mg total) by mouth at bedtime. 60 tablet 0  . vitamin B-12 (CYANOCOBALAMIN) 500 MCG tablet Take 1 tablet (500 mcg total) by mouth daily. 30 tablet 3  . Insulin Glargine (LANTUS) 100 UNIT/ML Solostar Pen Inject 20-25 Units into the skin daily at 10 pm. (Patient taking differently: Inject 25 Units into the skin daily at 10 pm. ) 30 mL 4   No facility-administered medications prior to visit.     Allergies  Allergen Reactions  . Phenergan [Promethazine] Hives    ROS Review of Systems  Constitutional: Negative.   Respiratory: Negative.   Cardiovascular: Negative.   Endocrine: Positive for polydipsia, polyphagia and polyuria.  Genitourinary: Negative.   Musculoskeletal: Negative.   Skin: Negative.   Neurological: Negative.   Psychiatric/Behavioral: Negative.       Objective:    Physical Exam No PE was done LMP 01/03/2016 (Within Years)  Wt Readings from Last 3 Encounters:  08/28/19 208 lb (94.3 kg)  08/07/19 205 lb (93 kg)  07/03/19 201 lb 1.6 oz (91.2 kg)     Health Maintenance Due  Topic Date Due  . HIV Screening  07/04/1984  . TETANUS/TDAP  07/04/1988  . PAP SMEAR-Modifier  07/04/1990  . OPHTHALMOLOGY EXAM  03/23/2019  . MAMMOGRAM  07/05/2019  . COLONOSCOPY  07/05/2019    There are no preventive care reminders to display for this patient.  Lab Results  Component Value Date   TSH 0.693 06/27/2019   Lab Results  Component Value Date   WBC 9.9 06/27/2019   HGB 15.8 06/27/2019   HCT 49.3 (H) 06/27/2019   MCV 91 06/27/2019   PLT 340 06/27/2019   Lab Results  Component Value Date   NA 135 06/27/2019   K 4.3 06/27/2019   CO2 23 06/27/2019   GLUCOSE 399 (H) 06/27/2019   BUN 21 06/27/2019   CREATININE 1.02 (H) 06/27/2019   BILITOT 0.5 06/27/2019   ALKPHOS 114 06/27/2019   AST 13 06/27/2019   ALT 11  06/27/2019   PROT 6.8 06/27/2019   ALBUMIN 4.2 06/27/2019   CALCIUM 9.4 06/27/2019   ANIONGAP 9 02/12/2014   Lab Results  Component Value Date   CHOL 151 06/27/2019   Lab Results  Component Value Date   HDL 60 06/27/2019   Lab Results  Component Value Date   LDLCALC 68 06/27/2019   Lab Results  Component Value Date   TRIG 115 06/27/2019   Lab Results  Component Value Date   CHOLHDL 2.5 06/27/2019   Lab Results  Component Value Date   HGBA1C 8.2 (H) 06/27/2019      Assessment & Plan:     1. Type 1 diabetes mellitus without complication (HCC) - Un controlled Diabetes due to non compliance. Her HgbA1c was 8.2% and her goal is <6%.  Her Lantus was increased to 30 units and she was advised to bring her blood glucose log and medications to her next clinic appointment. -Use Diabetic diet as advised  -Take medications regularly as advised -Regular exercise as tolerated. - Basic Metabolic Panel (BMET); Future - HgB A1c; Future - Insulin Glargine (LANTUS) 100 UNIT/ML Solostar Pen; Inject 30 Units into the skin at bedtime.  Dispense: 30 mL; Refill: 4  2. Restless legs - She will continue on current treatment regimen, and will follow up with Ms. Simpson to consider switching Mirtazapine to Cymbalta.   Follow-up: Return in about 4 weeks (around 10/09/2019), or if symptoms worsen or fail to improve.    Tiarrah Saville Jerold Coombe, NP

## 2019-09-12 ENCOUNTER — Other Ambulatory Visit: Payer: Self-pay | Admitting: Gerontology

## 2019-09-12 ENCOUNTER — Other Ambulatory Visit: Payer: Self-pay

## 2019-09-12 DIAGNOSIS — I10 Essential (primary) hypertension: Secondary | ICD-10-CM

## 2019-09-12 MED ORDER — MIRTAZAPINE 30 MG PO TABS: 60.0000 mg | ORAL_TABLET | Freq: Every day | ORAL | 0 refills | Status: DC

## 2019-09-18 ENCOUNTER — Other Ambulatory Visit: Payer: Self-pay

## 2019-09-19 ENCOUNTER — Ambulatory Visit: Payer: Self-pay | Attending: Oncology | Admitting: *Deleted

## 2019-09-19 ENCOUNTER — Other Ambulatory Visit: Payer: Self-pay

## 2019-09-19 ENCOUNTER — Ambulatory Visit
Admission: RE | Admit: 2019-09-19 | Discharge: 2019-09-19 | Disposition: A | Discharge status: Home / Self Care | Payer: Self-pay | Source: Ambulatory Visit | Attending: Oncology | Admitting: Oncology

## 2019-09-19 ENCOUNTER — Encounter: Payer: Self-pay | Admitting: *Deleted

## 2019-09-19 VITALS — BP 148/98 | HR 95 | Temp 97.7°F | Resp 16 | Ht 60.0 in | Wt 208.1 lb

## 2019-09-19 DIAGNOSIS — Z0000 Encounter for general adult medical examination without abnormal findings: Secondary | ICD-10-CM

## 2019-09-19 IMAGING — MG DIGITAL SCREENING BILAT W/ TOMO W/ CAD
8 series · 8 of 24 positions shown · non-contrast
Comparison: Previous exam(s).

CLINICAL DATA: Screening.

EXAM:
DIGITAL SCREENING BILATERAL MAMMOGRAM WITH TOMO AND CAD

[L CC synth-2D]
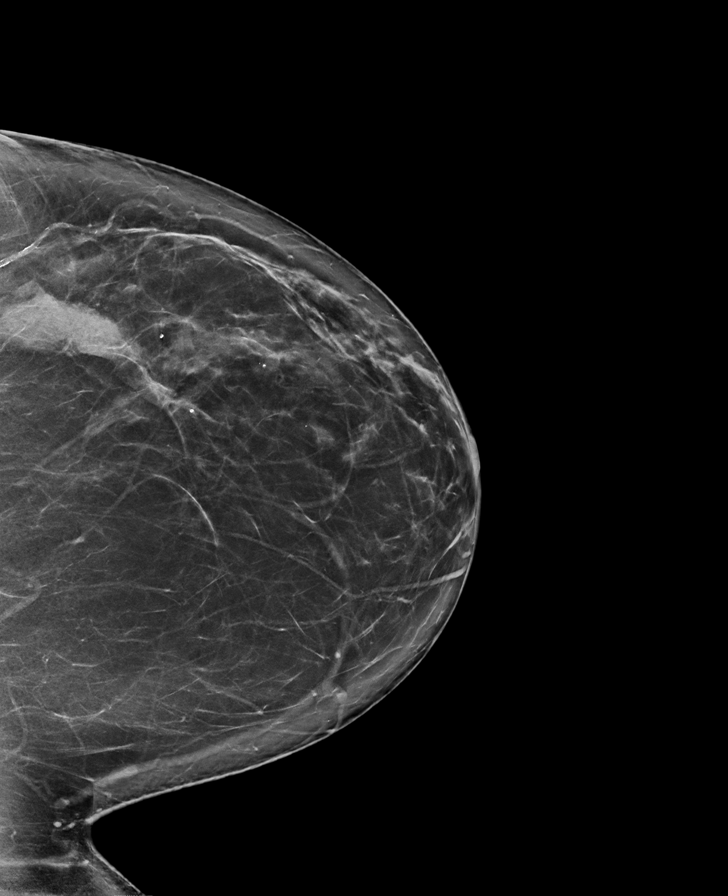

[R MLO synth-2D]
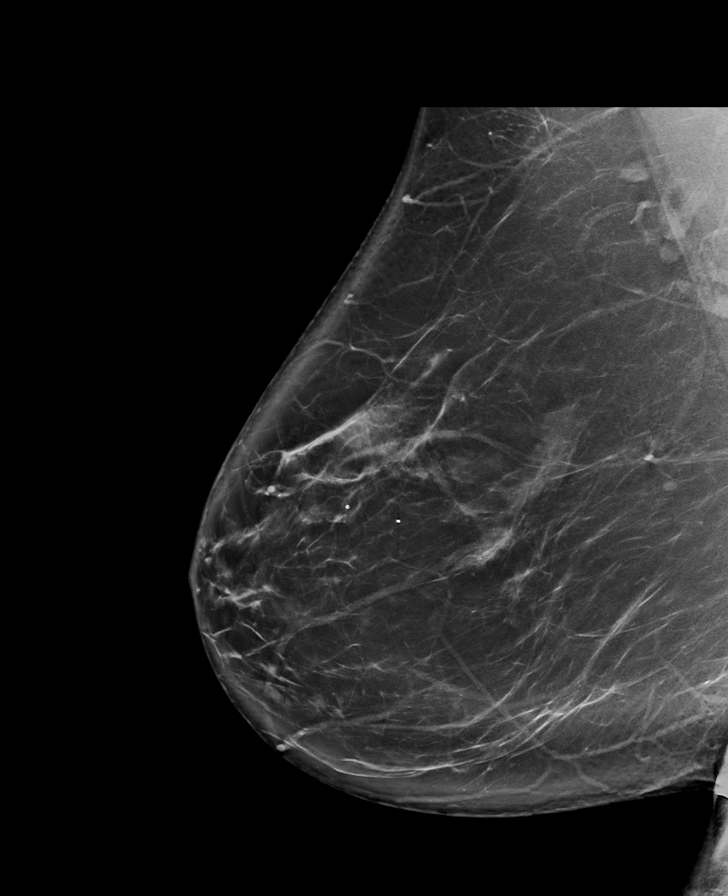

[L MLO synth-2D]
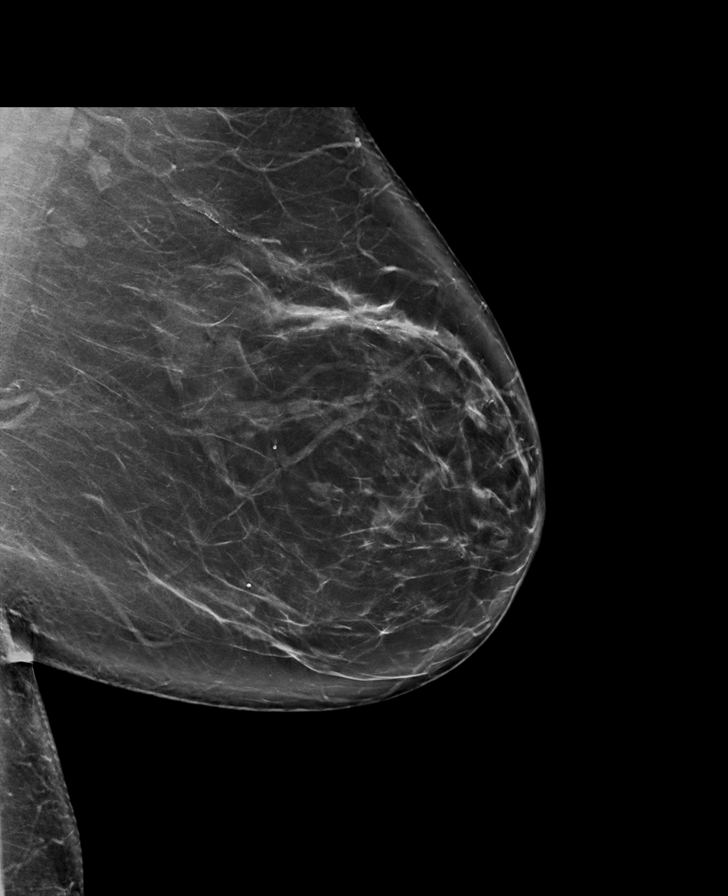

[R CC synth-2D]
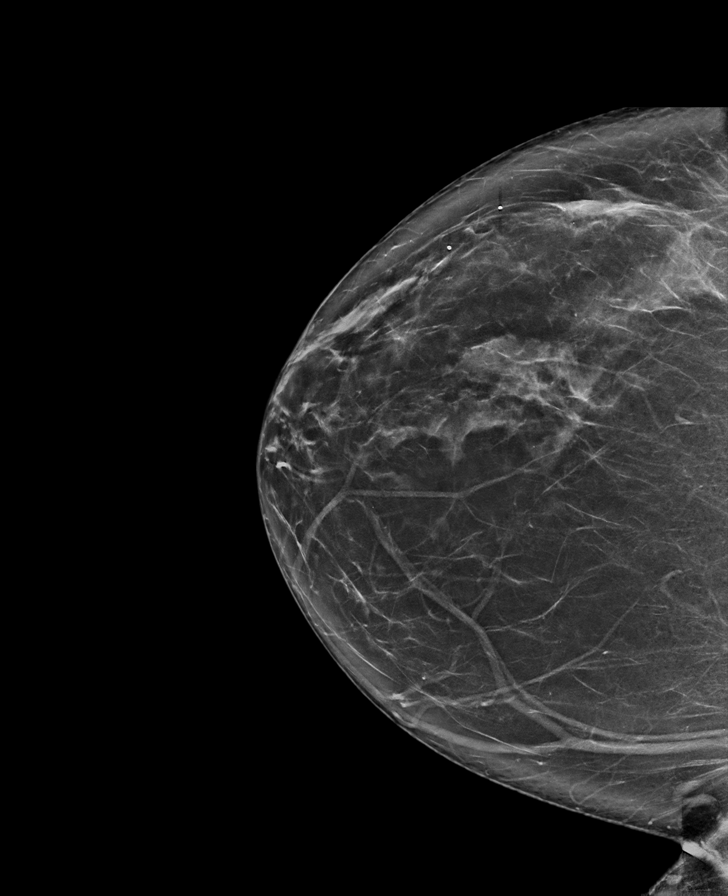

[L CC tomo · tomo slice 45/89.0]
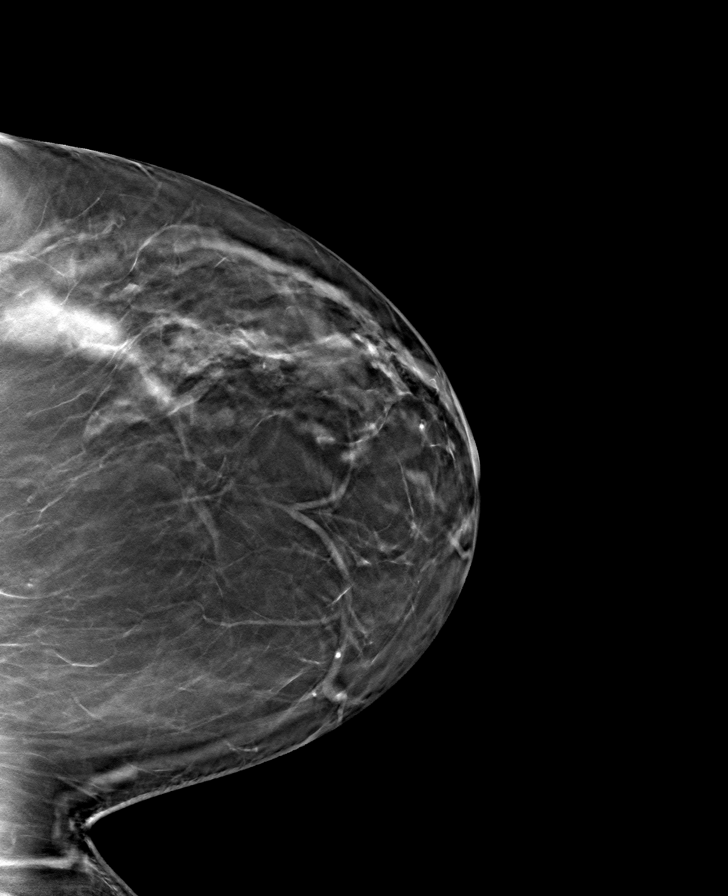

[L MLO tomo · tomo slice 49/98.0]
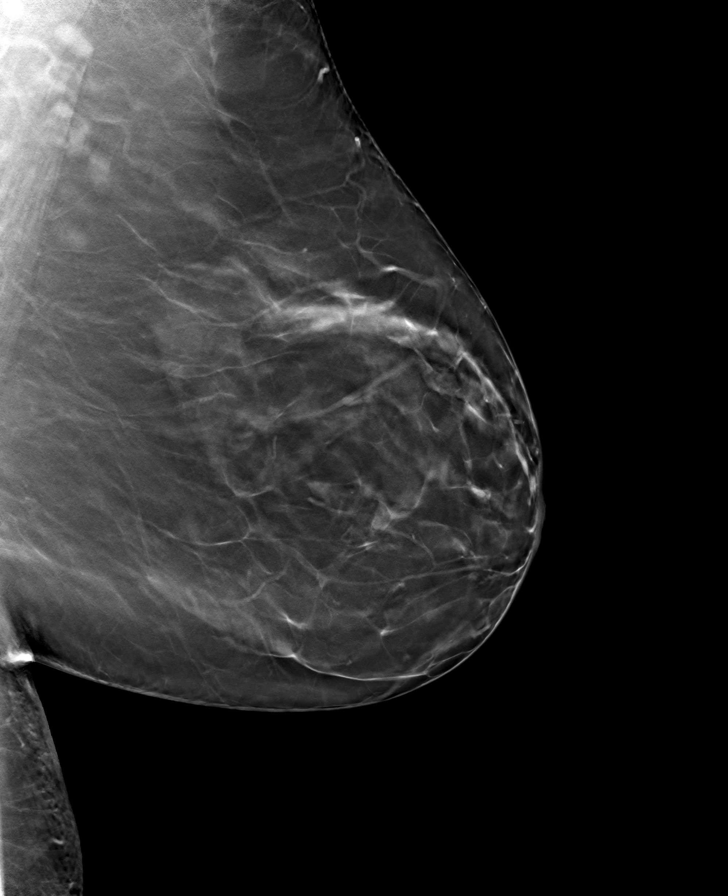

[R CC tomo · tomo slice 43/85.0]
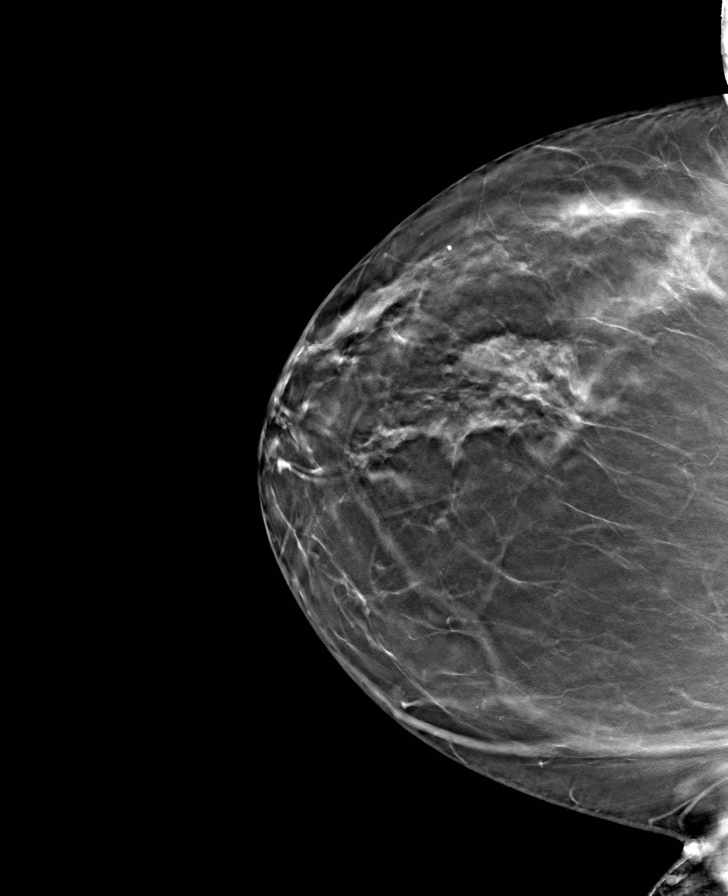

[R MLO tomo · tomo slice 51/101.0]
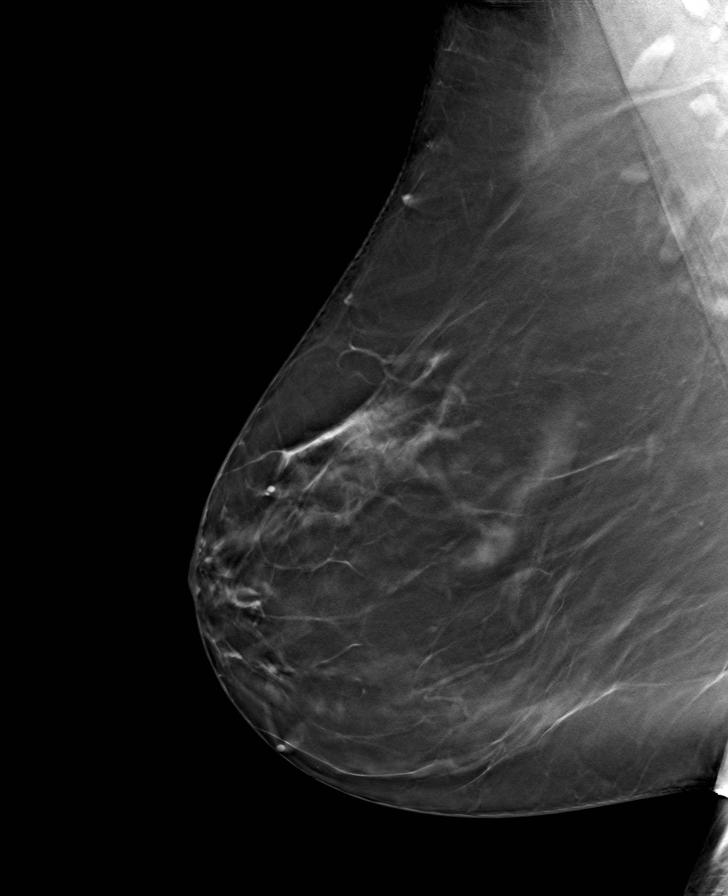

[8 of 24 positions shown; findings below may reference images not displayed]

ACR Breast Density Category b: There are scattered areas of
fibroglandular density.
FINDINGS: There are no findings suspicious for malignancy. Images were
processed with CAD.
IMPRESSION: No mammographic evidence of malignancy. A result letter of this
screening mammogram will be mailed directly to the patient.

RECOMMENDATION:
Screening mammogram in one year. (Code:[TQ])

BI-RADS CATEGORY  1: Negative.

## 2019-09-19 NOTE — Patient Instructions (Signed)
Gave patient hand-out, Women Staying Healthy, Active and Well from [facility], with education on breast health, pap smears, heart and colon health. 

## 2019-09-19 NOTE — Progress Notes (Signed)
  Subjective:     Patient ID: Courtney Stafford, female   DOB: [birth year], 48 y.o.   MRN: [id number]  HPI   Review of Systems     Objective:   Physical Exam Chest:     Breasts:        Right: Skin change present. No swelling, bleeding, inverted nipple, mass, nipple discharge or tenderness.        Left: Skin change present. No swelling, bleeding, inverted nipple, mass, nipple discharge or tenderness.    Abdominal:     Palpations: There is no splenomegaly.     Hernia: There is no hernia in the left inguinal area or right inguinal area.    Genitourinary:    Exam position: Lithotomy position.     Labia:        Right: No rash, tenderness, lesion or injury.        Left: No rash, tenderness, lesion or injury.      Urethra: No prolapse or urethral pain.     Vagina: No signs of injury and foreign body. Vaginal discharge present. No erythema, tenderness, bleeding, lesions or prolapsed vaginal walls.     Cervix: No cervical motion tenderness, discharge, friability, lesion, erythema, cervical bleeding or eversion.     Uterus: Not deviated, not enlarged, not fixed and no uterine prolapse.      Adnexa:        Left: No mass.          Comments: Refused rectal exam Lymphadenopathy:     Upper Body:     Right upper body: No supraclavicular or axillary adenopathy.     Left upper body: No supraclavicular or axillary adenopathy.     Lower Body: No right inguinal adenopathy. No left inguinal adenopathy.        Assessment:     48 year old White female referred to [place] by the [facility] for clinical breast exam, pap and mammogram.  Patient complains of itching at bilateral nipples.  States it has been going on for over a month.  On clinical breast exam I can visualize a rash, scaly rash at bilateral areola.  Patient denies, changing to new bra's, detergent, or soaps.  Denies sexual activity. There is no dominant mass, nipple discharge or lymphadenopathy.  Taught self breast awareness.   Specimen collected for pap smear.  There is white non-odorous discharge noted on exam.  Tyrer-Cuzick breast cancer risk assessment with a lifetime risk of 7.8 %.  Per NCCN guidelines no further imagaing or genetic testing is recommended.  Patient has been screened for eligibility.  She does not have any insurance, Medicare or Medicaid.  She also meets financial eligibility.  Hand-out given on the Affordable Care Act.    Plan:     Screening mammogram ordered.  Will schedule patient to see Dr. Bary Castilla for further evaluation of the bilateral pruritic areola rash.  Specimen for pap sent to the lab.

## 2019-09-20 ENCOUNTER — Other Ambulatory Visit: Payer: Self-pay

## 2019-09-21 ENCOUNTER — Other Ambulatory Visit: Payer: Self-pay

## 2019-09-21 MED ORDER — MIRTAZAPINE 30 MG PO TABS: 15.0000 mg | ORAL_TABLET | Freq: Every day | ORAL | 0 refills | Status: DC

## 2019-09-21 MED ORDER — DULOXETINE HCL 60 MG PO CPEP: 60.0000 mg | ORAL_CAPSULE | Freq: Every morning | ORAL | 0 refills | Status: DC

## 2019-09-25 ENCOUNTER — Other Ambulatory Visit: Payer: Self-pay

## 2019-09-25 ENCOUNTER — Ambulatory Visit: Payer: Self-pay | Admitting: Licensed Clinical Social Worker

## 2019-09-25 DIAGNOSIS — F4310 Post-traumatic stress disorder, unspecified: Secondary | ICD-10-CM

## 2019-09-25 DIAGNOSIS — F411 Generalized anxiety disorder: Secondary | ICD-10-CM

## 2019-09-25 DIAGNOSIS — F331 Major depressive disorder, recurrent, moderate: Secondary | ICD-10-CM

## 2019-09-25 NOTE — BH Specialist Note (Signed)
Integrated Behavioral Health Follow Up Visit Via Phone  MRN: [id number] Name: MAREA REASNER  Type of Service: [place] Interpretor:No. Interpretor Name and Language:   SUBJECTIVE: TAVARIA MACKINS is a 48 y.o. female accompanied by herself. Patient was referred by the endocrinology group for mental health. Patient reports the following symptoms/concerns: She notes that she had a early Thanksgiving with her daughter and her son in [place] immediate family. She explains that she was happy to get out of the house for a couple of hours. She notes that it was nice to see her new grandson and her other grandson who is about to be 55 years old in week. She reports that she went to the dentist today and was told she had a gum disease. She explains that it was hard to understand the dentist with his mask on. She notes that she has one more appointment that is covered but was told she needs additional appointments to have X Rays completed. She notes that the dentist wants to look over her lab work prior to being able to do anything for her. She notes that she got a lot of walking in today on the way back from the dentist. She notes that she still cannot sleep at night and the pharmacy has not reached out to her that the Cymbalta as at the pharmacy. She explains that she has yet to hear anything from [employer] about being able to volunteer. She denies suicidal and homicidal thoughts.  Duration of problem: ; Severity of problem: moderate  OBJECTIVE: Mood: Euthymic and Affect: Appropriate Risk of harm to self or others: No plan to harm self or others  LIFE CONTEXT: Family and Social: see above. School/Work: see above. Self-Care: see above. Life Changes: see above.   GOALS ADDRESSED: Patient will: 1.  Reduce symptoms of: anxiety and insomnia  2.  Increase knowledge and/or ability of: coping skills, healthy habits and self-management skills  3.  Demonstrate  ability to: Increase healthy adjustment to current life circumstances  INTERVENTIONS: Interventions utilized:  Supportive Counseling was utilized by the clinician during today's follow up session. Clinician processed with the patient regarding how she has been doing since the last follow up session. Clinician discussed with the patient regarding if she enjoyed the time that she spent with her family. Clinician explained to the patient that once the dentist at [facility] faxes over a request for additional work up that is needed, she can discuss with staff what the options are. Clinician informed the patient the the Cymbalta 60 mg once a day along with the decrease in Mirtazapine from 60 mg to 30 mg at bedtime was sent electronically to medication management on Friday November 6th. Clinician encouraged the patient to continue to utilize her coping skills and practice self care.  Standardized Assessments completed: GAD-7 and PHQ 9  ASSESSMENT: Patient currently experiencing see above.   Patient may benefit from see above.  PLAN: 1. Follow up with behavioral health clinician on : two weeks or earlier if needed. 2. Behavioral recommendations: see above. 3. Referral(s): [place] (In Clinic) 4. "From scale of 1-10, how likely are you to follow plan?":   Bayard Hugger, LCSW

## 2019-09-27 ENCOUNTER — Ambulatory Visit: Payer: Self-pay | Admitting: General Surgery

## 2019-09-27 LAB — PAP LB AND HPV HIGH-RISK: HPV, high-risk: NEGATIVE

## 2019-10-03 ENCOUNTER — Other Ambulatory Visit: Payer: Self-pay

## 2019-10-08 ENCOUNTER — Other Ambulatory Visit: Payer: Self-pay | Admitting: Gerontology

## 2019-10-08 DIAGNOSIS — G2581 Restless legs syndrome: Secondary | ICD-10-CM

## 2019-10-09 ENCOUNTER — Ambulatory Visit: Payer: Self-pay | Admitting: Licensed Clinical Social Worker

## 2019-10-09 ENCOUNTER — Ambulatory Visit: Payer: Self-pay | Admitting: Gerontology

## 2019-10-10 ENCOUNTER — Other Ambulatory Visit: Payer: Self-pay

## 2019-10-10 ENCOUNTER — Encounter: Payer: Self-pay | Admitting: *Deleted

## 2019-10-10 DIAGNOSIS — E109 Type 1 diabetes mellitus without complications: Secondary | ICD-10-CM

## 2019-10-10 DIAGNOSIS — G2581 Restless legs syndrome: Secondary | ICD-10-CM

## 2019-10-10 NOTE — Progress Notes (Signed)
Patient had surgical consult for scaliness / rash around bilateral areolar with benign findings.  Dr. Bary Castilla is treating her for psoriasis.  Next mammogram in one year.  [facility] to [place].

## 2019-10-11 LAB — BASIC METABOLIC PANEL
BUN/Creatinine Ratio: 12 (ref 9–23)
BUN: 11 mg/dL (ref 6–24)
CO2: 25 mmol/L (ref 20–29)
Calcium: 9.2 mg/dL (ref 8.7–10.2)
Chloride: 98 mmol/L (ref 96–106)
Creatinine, Ser: 0.89 mg/dL (ref 0.57–1.00)
GFR calc Af Amer: 87 mL/min/{1.73_m2} (ref >59)
GFR calc non Af Amer: 76 mL/min/{1.73_m2} (ref >59)
Glucose: 309 mg/dL — ABNORMAL HIGH (ref 65–99)
Potassium: 3.7 mmol/L (ref 3.5–5.2)
Sodium: 138 mmol/L (ref 134–144)

## 2019-10-11 LAB — HEMOGLOBIN A1C
Est. average glucose Bld gHb Est-mCnc: 194 mg/dL
Hgb A1c MFr Bld: 8.4 % — ABNORMAL HIGH (ref 4.8–5.6)

## 2019-10-11 LAB — MAGNESIUM: Magnesium: 2.2 mg/dL (ref 1.6–2.3)

## 2019-10-17 ENCOUNTER — Other Ambulatory Visit: Payer: Self-pay

## 2019-10-17 ENCOUNTER — Encounter: Payer: Self-pay | Admitting: Gerontology

## 2019-10-17 ENCOUNTER — Ambulatory Visit: Payer: Self-pay | Admitting: Gerontology

## 2019-10-17 VITALS — BP 140/90 | HR 82 | Ht 60.0 in | Wt 210.0 lb

## 2019-10-17 DIAGNOSIS — F411 Generalized anxiety disorder: Secondary | ICD-10-CM

## 2019-10-17 DIAGNOSIS — E109 Type 1 diabetes mellitus without complications: Secondary | ICD-10-CM

## 2019-10-17 DIAGNOSIS — G2581 Restless legs syndrome: Secondary | ICD-10-CM

## 2019-10-17 DIAGNOSIS — I10 Essential (primary) hypertension: Secondary | ICD-10-CM

## 2019-10-17 MED ORDER — HYDROCHLOROTHIAZIDE 25 MG PO TABS: 25.0000 mg | ORAL_TABLET | Freq: Every day | ORAL | 1 refills | Status: DC

## 2019-10-17 MED ORDER — CARVEDILOL 12.5 MG PO TABS: 12.5000 mg | ORAL_TABLET | Freq: Two times a day (BID) | ORAL | 3 refills | Status: DC

## 2019-10-17 MED ORDER — MIRTAZAPINE 30 MG PO TABS: 30.0000 mg | ORAL_TABLET | Freq: Every day | ORAL | 0 refills | Status: DC

## 2019-10-17 MED ORDER — NOVOLOG FLEXPEN 100 UNIT/ML ~~LOC~~ SOPN: PEN_INJECTOR | SUBCUTANEOUS | 3 refills | Status: DC

## 2019-10-17 MED ORDER — GABAPENTIN 300 MG PO CAPS: ORAL_CAPSULE | ORAL | 1 refills | Status: DC

## 2019-10-17 NOTE — Progress Notes (Signed)
Established Patient Office Visit  Subjective:  Patient ID: Courtney Stafford, female    DOB: Oct 04, 1971  Age: 48 y.o. MRN: [id number]  CC:  Chief Complaint  Patient presents with  . Diabetes    HPI Courtney Stafford presents for follow up of Restless leg syndrome, Type 1 Diabetes Mellitus, hypertension and medication refill. She states that taking 600 mg gabapentin bid, 900 mg at bedtime and 60 mg Cymbalta relieves her restless leg symptoms and she states that it has improved 75% since she started taking Cymbalta 5 days ago. Her HgbA1c done on 09/30/2019 increased from 8.2% to 8.4%, and she reports checking her blood glucose 3 times daily.  She brought her blood glucose log to the clinic,her fasting blood glucose readings done between 11 a m-12 pm ranges between 62- 293 mg/dl, her evening readings at 6 pm ranges between 84 mg/dl to 124 mg/dl. She had 5-6 low readings at 10 pm that ranges between 52 mg/dl- 82 mg/dl, but usually her 10 pm readings were between 121 mg/dl-230 mg/dl. She reports experiencing hypoglycemic symptoms when her blood glucose was low.  She states that she is compliant with her medications, and continues to work on adhering to her diet.  She checks her blood pressure at home and her SBP 85% of the time ranges between 120-135, and her DBP ranges between 80 - 95. Her SBP 15% of the time ranges between 142-152 and DBP was between 92-109.  She continues to smoke less than 1 pack of cigarettes daily and admits to desire to quit.  She denies chest pain , palpitation, lightheadedness, myalgia, peripheral edema and cough.  She states that she is doing well and offers no further complaints.  Past Medical History:  Diagnosis Date  . Diabetes mellitus without complication (HCC)   . Hypertension     Past Surgical History:  Procedure Laterality Date  . CARPAL TUNNEL RELEASE  6 years ago   both hands    Family History  Problem Relation Age of Onset  . Cancer Father   . Breast  cancer Neg Hx     Social History   Socioeconomic History  . Marital status: Single    Spouse name: Not on file  . Number of children: Not on file  . Years of education: Not on file  . Highest education level: High school graduate  Occupational History  . Occupation: unemployed  Social Needs  . Financial resource strain: Not very hard  . Food insecurity    Worry: Sometimes true    Inability: Sometimes true  . Transportation needs    Medical: Yes    Non-medical: Yes  Tobacco Use  . Smoking status: Current Some Day Smoker    Packs/day: 0.10  . Smokeless tobacco: Former Engineer, water and Sexual Activity  . Alcohol use: No  . Drug use: No  . Sexual activity: Not on file  Lifestyle  . Physical activity    Days per week: 0 days    Minutes per session: 0 min  . Stress: Rather much  Relationships  . Social Musician on phone: Three times a week    Gets together: Never    Attends religious service: Never    Active member of club or organization: No    Attends meetings of clubs or organizations: Never    Relationship status: Separated  . Intimate partner violence    Fear of current or ex partner: No    Emotionally  abused: No    Physically abused: No    Forced sexual activity: No  Other Topics Concern  . Not on file  Social History Narrative   Completed biopsychosocial assessment, social determinants screening, administered PHQ 9, and GAD 7.       Transportation is a barrier to appointments. Receives food stamps 192 per month but worries about running out.       Support system is small.    Outpatient Medications Prior to Visit  Medication Sig Dispense Refill  . amLODipine (NORVASC) 10 MG tablet Take 1 tablet (10 mg total) by mouth daily. 90 tablet 4  . atorvastatin (LIPITOR) 10 MG tablet Take 1 tablet (10 mg total) by mouth daily. 30 tablet 3  . Blood Glucose Monitoring Suppl Supplies MISC 1 each by Does not apply route 5 (five) times daily. 500 each 4   . DULoxetine (CYMBALTA) 60 MG capsule Take 1 capsule (60 mg total) by mouth every morning. 30 capsule 0  . Insulin Glargine (LANTUS) 100 UNIT/ML Solostar Pen Inject 30 Units into the skin at bedtime. 30 mL 4  . Insulin Pen Needle (BD PEN NEEDLE NANO U/F) 32G X 4 MM MISC 5 times daily 200 each 12  . lisinopril (PRINIVIL,ZESTRIL) 40 MG tablet Take 1 tablet (40 mg total) by mouth daily. 90 tablet 4  . vitamin B-12 (CYANOCOBALAMIN) 500 MCG tablet Take 1 tablet (500 mcg total) by mouth daily. 30 tablet 3  . carvedilol (COREG) 12.5 MG tablet Take 1 tablet (12.5 mg total) by mouth 2 (two) times daily with a meal. 60 tablet 2  . gabapentin (NEURONTIN) 300 MG capsule TAKE 2 CAPSULES BYMOUTH 2 TIMES A DAY AND 3 CAPSULES AT NIGHT 98 capsule 0  . hydrochlorothiazide (HYDRODIURIL) 25 MG tablet TAKE ONE TABLET BY MOUTH EVERY DAY 30 tablet 0  . insulin aspart (NOVOLOG FLEXPEN) 100 UNIT/ML FlexPen ADJUST UNITS BASED ON CARB INTAKE UP TO 20 UNITS PER DOSE. MAX DAILY DOSE IS 60 UNITS. 75 mL 0  . mirtazapine (REMERON) 30 MG tablet Take 0.5 tablets (15 mg total) by mouth at bedtime. 15 tablet 0   No facility-administered medications prior to visit.     Allergies  Allergen Reactions  . Phenergan [Promethazine] Hives    ROS Review of Systems  Constitutional: Negative.   HENT: Negative.   Respiratory: Negative.   Gastrointestinal: Negative.   Endocrine: Negative.   Neurological: Negative.   Psychiatric/Behavioral: Negative.       Objective:    Physical Exam  Constitutional: She is oriented to person, place, and time. She appears well-developed.  HENT:  Head: Normocephalic and atraumatic.  Eyes: Pupils are equal, round, and reactive to light. EOM are normal.  Cardiovascular: Normal rate and regular rhythm.  Pulmonary/Chest: Effort normal and breath sounds normal.  Abdominal: Soft. Bowel sounds are normal.  Neurological: She is alert and oriented to person, place, and time.  Skin: Skin is warm and  dry.  Psychiatric: She has a normal mood and affect. Her behavior is normal. Judgment and thought content normal.    BP 140/90 (BP Location: Left Arm, Patient Position: Sitting)   Pulse 82   Ht 5' (1.524 m)   Wt 210 lb (95.3 kg)   LMP 01/03/2016 (Within Years)   SpO2 99%   BMI 41.01 kg/m  Wt Readings from Last 3 Encounters:  10/17/19 210 lb (95.3 kg)  09/19/19 208 lb 1.6 oz (94.4 kg)  08/28/19 208 lb (94.3 kg)   She was advised  to continue on her weight loss regimen.  Health Maintenance Due  Topic Date Due  . HIV Screening  07/04/1984  . TETANUS/TDAP  07/04/1988  . OPHTHALMOLOGY EXAM  03/23/2019  . COLONOSCOPY  07/05/2019    There are no preventive care reminders to display for this patient.  Lab Results  Component Value Date   TSH 0.693 06/27/2019   Lab Results  Component Value Date   WBC 9.9 06/27/2019   HGB 15.8 06/27/2019   HCT 49.3 (H) 06/27/2019   MCV 91 06/27/2019   PLT 340 06/27/2019   Lab Results  Component Value Date   NA 138 10/10/2019   K 3.7 10/10/2019   CO2 25 10/10/2019   GLUCOSE 309 (H) 10/10/2019   BUN 11 10/10/2019   CREATININE 0.89 10/10/2019   BILITOT 0.5 06/27/2019   ALKPHOS 114 06/27/2019   AST 13 06/27/2019   ALT 11 06/27/2019   PROT 6.8 06/27/2019   ALBUMIN 4.2 06/27/2019   CALCIUM 9.2 10/10/2019   ANIONGAP 9 02/12/2014   Lab Results  Component Value Date   CHOL 151 06/27/2019   Lab Results  Component Value Date   HDL 60 06/27/2019   Lab Results  Component Value Date   LDLCALC 68 06/27/2019   Lab Results  Component Value Date   TRIG 115 06/27/2019   Lab Results  Component Value Date   CHOLHDL 2.5 06/27/2019   Lab Results  Component Value Date   HGBA1C 8.4 (H) 10/10/2019      Assessment & Plan:    1. Essential hypertension -Her blood pressure is improving and she will continue on current treatment regimen.  She was advised to continue on DASH diet, and exercise as tolerated.  She was also encouraged to  continue checking her blood pressure, record and bring the log to visit. - hydrochlorothiazide (HYDRODIURIL) 25 MG tablet; Take 1 tablet (25 mg total) by mouth daily.  Dispense: 90 tablet; Refill: 1 - carvedilol (COREG) 12.5 MG tablet; Take 1 tablet (12.5 mg total) by mouth 2 (two) times daily with a meal.  Dispense: 60 tablet; Refill: 3  2. Restless legs -Her restless leg has improved 75% since starting 60 mg of Cymbalta, and she was encouraged to continue on treatment regimen. - gabapentin (NEURONTIN) 300 MG capsule; TAKE 2 CAPSULES BYMOUTH 2 TIMES A DAY AND 3 CAPSULES AT NIGHT  Dispense: 98 capsule; Refill: 1  3. Type 1 diabetes mellitus without complication (HCC) -Her hemoglobin A1c was 8.4%, and her goal should be less than 6%.  She was advised to continue on her medications, check her record her blood glucose and bring log to her appointment.  Her fasting blood glucose readings should be between 80 to 130 mg per DL.  She was educated on the signs and symptoms of hypoglycemia and the treatment.  She was advised to continue on low carbohydrate/nonconcentrated sweet diet. She continues to follow up with [facility] Endocrinology team. - insulin aspart (NOVOLOG FLEXPEN) 100 UNIT/ML FlexPen; She states taking 5-7 units with meals and adjust with sliding scale as prescribed by Endocrinology..  Dispense: 75 mL; Refill: 3 - HgB A1c; Future  4. Generalized anxiety disorder -She will continue on current treatment regimen, and will follow up with Ms. Julian Hy for her mental health care.  She was advised to call crisis helpline for worsening anxiety. - mirtazapine (REMERON) 30 MG tablet; Take 1 tablet (30 mg total) by mouth at bedtime.  Dispense: 30 tablet; Refill: 0    Follow-up:  Return in about 3 months (around 01/15/2020), or if symptoms worsen or fail to improve.    Sharone Picchi Trellis PaganiniE Cariah Salatino, NP

## 2019-10-17 NOTE — Patient Instructions (Signed)
Carbohydrate Counting for Diabetes Mellitus, Adult  Carbohydrate counting is a method of keeping track of how many carbohydrates you eat. Eating carbohydrates naturally increases the amount of sugar (glucose) in the blood. Counting how many carbohydrates you eat helps keep your blood glucose within normal limits, which helps you manage your diabetes (diabetes mellitus). It is important to know how many carbohydrates you can safely have in each meal. This is different for every person. A diet and nutrition specialist (registered dietitian) can help you make a meal plan and calculate how many carbohydrates you should have at each meal and snack. Carbohydrates are found in the following foods:  Grains, such as breads and cereals.  Dried beans and soy products.  Starchy vegetables, such as potatoes, peas, and corn.  Fruit and fruit juices.  Milk and yogurt.  Sweets and snack foods, such as cake, cookies, candy, chips, and soft drinks. How do I count carbohydrates? There are two ways to count carbohydrates in food. You can use either of the methods or a combination of both. Reading "Nutrition Facts" on packaged food The "Nutrition Facts" list is included on the labels of almost all packaged foods and beverages in the [place] It includes:  The serving size.  Information about nutrients in each serving, including the grams (g) of carbohydrate per serving. To use the "Nutrition Facts":  Decide how many servings you will have.  Multiply the number of servings by the number of carbohydrates per serving.  The resulting number is the total amount of carbohydrates that you will be having. Learning standard serving sizes of other foods When you eat carbohydrate foods that are not packaged or do not include "Nutrition Facts" on the label, you need to measure the servings in order to count the amount of carbohydrates:  Measure the foods that you will eat with a food scale or measuring cup, if needed.   Decide how many standard-size servings you will eat.  Multiply the number of servings by 15. Most carbohydrate-rich foods have about 15 g of carbohydrates per serving. ? For example, if you eat 8 oz (170 g) of strawberries, you will have eaten 2 servings and 30 g of carbohydrates (2 servings x 15 g = 30 g).  For foods that have more than one food mixed, such as soups and casseroles, you must count the carbohydrates in each food that is included. The following list contains standard serving sizes of common carbohydrate-rich foods. Each of these servings has about 15 g of carbohydrates:   hamburger bun or  English muffin.   oz (15 mL) syrup.   oz (14 g) jelly.  1 slice of bread.  1 six-inch tortilla.  3 oz (85 g) cooked rice or pasta.  4 oz (113 g) cooked dried beans.  4 oz (113 g) starchy vegetable, such as peas, corn, or potatoes.  4 oz (113 g) hot cereal.  4 oz (113 g) mashed potatoes or  of a large baked potato.  4 oz (113 g) canned or frozen fruit.  4 oz (120 mL) fruit juice.  4-6 crackers.  6 chicken nuggets.  6 oz (170 g) unsweetened dry cereal.  6 oz (170 g) plain fat-free yogurt or yogurt sweetened with artificial sweeteners.  8 oz (240 mL) milk.  8 oz (170 g) fresh fruit or one small piece of fruit.  24 oz (680 g) popped popcorn. Example of carbohydrate counting Sample meal  3 oz (85 g) chicken breast.  6 oz (170 g)   brown rice.  4 oz (113 g) corn.  8 oz (240 mL) milk.  8 oz (170 g) strawberries with sugar-free whipped topping. Carbohydrate calculation 1. Identify the foods that contain carbohydrates: ? Rice. ? Corn. ? Milk. ? Strawberries. 2. Calculate how many servings you have of each food: ? 2 servings rice. ? 1 serving corn. ? 1 serving milk. ? 1 serving strawberries. 3. Multiply each number of servings by 15 g: ? 2 servings rice x 15 g = 30 g. ? 1 serving corn x 15 g = 15 g. ? 1 serving milk x 15 g = 15 g. ? 1 serving  strawberries x 15 g = 15 g. 4. Add together all of the amounts to find the total grams of carbohydrates eaten: ? 30 g + 15 g + 15 g + 15 g = 75 g of carbohydrates total. Summary  Carbohydrate counting is a method of keeping track of how many carbohydrates you eat.  Eating carbohydrates naturally increases the amount of sugar (glucose) in the blood.  Counting how many carbohydrates you eat helps keep your blood glucose within normal limits, which helps you manage your diabetes.  A diet and nutrition specialist (registered dietitian) can help you make a meal plan and calculate how many carbohydrates you should have at each meal and snack. This information is not intended to replace advice given to you by your health care provider. Make sure you discuss any questions you have with your health care provider. Document Released: 11/01/2005 Document Revised: 05/26/2017 Document Reviewed: 04/14/2016 Elsevier Patient Education  2020 Else[employer] Inc. DASH Eating Plan DASH stands for "Dietary Approaches to Stop Hypertension." The DASH eating plan is a healthy eating plan that has been shown to reduce high blood pressure (hypertension). It may also reduce your risk for type 2 diabetes, heart disease, and stroke. The DASH eating plan may also help with weight loss. What are tips for following this plan?  General guidelines  Avoid eating more than 2,300 mg (milligrams) of salt (sodium) a day. If you have hypertension, you may need to reduce your sodium intake to 1,500 mg a day.  Limit alcohol intake to no more than 1 drink a day for nonpregnant women and 2 drinks a day for men. One drink equals 12 oz of beer, 5 oz of wine, or 1 oz of hard liquor.  Work with your health care provider to maintain a healthy body weight or to lose weight. Ask what an ideal weight is for you.  Get at least 30 minutes of exercise that causes your heart to beat faster (aerobic exercise) most days of the week. Activities may  include walking, swimming, or biking.  Work with your health care provider or diet and nutrition specialist (dietitian) to adjust your eating plan to your individual calorie needs. Reading food labels   Check food labels for the amount of sodium per serving. Choose foods with less than 5 percent of the Daily Value of sodium. Generally, foods with less than 300 mg of sodium per serving fit into this eating plan.  To find whole grains, look for the word "whole" as the first word in the ingredient list. Shopping  Buy products labeled as "low-sodium" or "no salt added."  Buy fresh foods. Avoid canned foods and premade or frozen meals. Cooking  Avoid adding salt when cooking. Use salt-free seasonings or herbs instead of table salt or sea salt. Check with your health care provider or pharmacist before using salt substitutes.    Do not fry foods. Cook foods using healthy methods such as baking, boiling, grilling, and broiling instead.  Cook with heart-healthy oils, such as olive, canola, soybean, or sunflower oil. Meal planning  Eat a balanced diet that includes: ? 5 or more servings of fruits and vegetables each day. At each meal, try to fill half of your plate with fruits and vegetables. ? Up to 6-8 servings of whole grains each day. ? Less than 6 oz of lean meat, poultry, or fish each day. A 3-oz serving of meat is about the same size as a deck of cards. One egg equals 1 oz. ? 2 servings of low-fat dairy each day. ? A serving of nuts, seeds, or beans 5 times each week. ? Heart-healthy fats. Healthy fats called Omega-3 fatty acids are found in foods such as flaxseeds and coldwater fish, like sardines, salmon, and mackerel.  Limit how much you eat of the following: ? Canned or prepackaged foods. ? Food that is high in trans fat, such as fried foods. ? Food that is high in saturated fat, such as fatty meat. ? Sweets, desserts, sugary drinks, and other foods with added sugar. ? Full-fat  dairy products.  Do not salt foods before eating.  Try to eat at least 2 vegetarian meals each week.  Eat more home-cooked food and less restaurant, buffet, and fast food.  When eating at a restaurant, ask that your food be prepared with less salt or no salt, if possible. What foods are recommended? The items listed may not be a complete list. Talk with your dietitian about what dietary choices are best for you. Grains Whole-grain or whole-wheat bread. Whole-grain or whole-wheat pasta. Brown rice. Oatmeal. Quinoa. Bulgur. Whole-grain and low-sodium cereals. Pita bread. Low-fat, low-sodium crackers. Whole-wheat flour tortillas. Vegetables Fresh or frozen vegetables (raw, steamed, roasted, or grilled). Low-sodium or reduced-sodium tomato and vegetable juice. Low-sodium or reduced-sodium tomato sauce and tomato paste. Low-sodium or reduced-sodium canned vegetables. Fruits All fresh, dried, or frozen fruit. Canned fruit in natural juice (without added sugar). Meat and other protein foods Skinless chicken or turkey. Ground chicken or turkey. Pork with fat trimmed off. Fish and seafood. Egg whites. Dried beans, peas, or lentils. Unsalted nuts, nut butters, and seeds. Unsalted canned beans. Lean cuts of beef with fat trimmed off. Low-sodium, lean deli meat. Dairy Low-fat (1%) or fat-free (skim) milk. Fat-free, low-fat, or reduced-fat cheeses. Nonfat, low-sodium ricotta or cottage cheese. Low-fat or nonfat yogurt. Low-fat, low-sodium cheese. Fats and oils Soft margarine without trans fats. Vegetable oil. Low-fat, reduced-fat, or light mayonnaise and salad dressings (reduced-sodium). Canola, safflower, olive, soybean, and sunflower oils. Avocado. Seasoning and other foods Herbs. Spices. Seasoning mixes without salt. Unsalted popcorn and pretzels. Fat-free sweets. What foods are not recommended? The items listed may not be a complete list. Talk with your dietitian about what dietary choices are best  for you. Grains Baked goods made with fat, such as croissants, muffins, or some breads. Dry pasta or rice meal packs. Vegetables Creamed or fried vegetables. Vegetables in a cheese sauce. Regular canned vegetables (not low-sodium or reduced-sodium). Regular canned tomato sauce and paste (not low-sodium or reduced-sodium). Regular tomato and vegetable juice (not low-sodium or reduced-sodium). Pickles. Olives. Fruits Canned fruit in a light or heavy syrup. Fried fruit. Fruit in cream or butter sauce. Meat and other protein foods Fatty cuts of meat. Ribs. Fried meat. Bacon. Sausage. Bologna and other processed lunch meats. Salami. Fatback. Hotdogs. Bratwurst. Salted nuts and seeds. Canned beans with   added salt. Canned or smoked fish. Whole eggs or egg yolks. Chicken or turkey with skin. Dairy Whole or 2% milk, cream, and half-and-half. Whole or full-fat cream cheese. Whole-fat or sweetened yogurt. Full-fat cheese. Nondairy creamers. Whipped toppings. Processed cheese and cheese spreads. Fats and oils Butter. Stick margarine. Lard. Shortening. Ghee. Bacon fat. Tropical oils, such as coconut, palm kernel, or palm oil. Seasoning and other foods Salted popcorn and pretzels. Onion salt, garlic salt, seasoned salt, table salt, and sea salt. Worcestershire sauce. Tartar sauce. Barbecue sauce. Teriyaki sauce. Soy sauce, including reduced-sodium. Steak sauce. Canned and packaged gravies. Fish sauce. Oyster sauce. Cocktail sauce. Horseradish that you find on the shelf. Ketchup. Mustard. Meat flavorings and tenderizers. Bouillon cubes. Hot sauce and Tabasco sauce. Premade or packaged marinades. Premade or packaged taco seasonings. Relishes. Regular salad dressings. Where to find more information:  National Heart, Lung, and Blood Institute: www.nhlbi.nih.gov  American Heart Association: www.heart.org Summary  The DASH eating plan is a healthy eating plan that has been shown to reduce high blood pressure  (hypertension). It may also reduce your risk for type 2 diabetes, heart disease, and stroke.  With the DASH eating plan, you should limit salt (sodium) intake to 2,300 mg a day. If you have hypertension, you may need to reduce your sodium intake to 1,500 mg a day.  When on the DASH eating plan, aim to eat more fresh fruits and vegetables, whole grains, lean proteins, low-fat dairy, and heart-healthy fats.  Work with your health care provider or diet and nutrition specialist (dietitian) to adjust your eating plan to your individual calorie needs. This information is not intended to replace advice given to you by your health care provider. Make sure you discuss any questions you have with your health care provider. Document Released: 10/21/2011 Document Revised: 10/14/2017 Document Reviewed: 10/25/2016 Elsevier Patient Education  2020 Elsevier Inc.  

## 2019-10-18 MED ORDER — NOVOLOG FLEXPEN 100 UNIT/ML ~~LOC~~ SOPN: PEN_INJECTOR | SUBCUTANEOUS | 3 refills | Status: DC

## 2019-10-19 ENCOUNTER — Other Ambulatory Visit: Payer: Self-pay | Admitting: Gerontology

## 2019-10-23 ENCOUNTER — Ambulatory Visit: Payer: Self-pay | Admitting: Licensed Clinical Social Worker

## 2019-10-25 ENCOUNTER — Other Ambulatory Visit: Payer: Self-pay

## 2019-10-25 ENCOUNTER — Ambulatory Visit: Payer: Self-pay | Admitting: Licensed Clinical Social Worker

## 2019-10-25 DIAGNOSIS — F331 Major depressive disorder, recurrent, moderate: Secondary | ICD-10-CM

## 2019-10-25 DIAGNOSIS — F411 Generalized anxiety disorder: Secondary | ICD-10-CM

## 2019-10-25 DIAGNOSIS — F4310 Post-traumatic stress disorder, unspecified: Secondary | ICD-10-CM

## 2019-10-25 NOTE — BH Specialist Note (Signed)
Therapist note [place] Follow Up Visit Via Phone  MRN: [id number] Name: Courtney Stafford  Type of Service: [place] Interpretor:No. Interpretor Name and Language: not applicable.   SUBJECTIVE: Courtney Stafford is a 48 y.o. female accompanied by herself Patient was referred by . Patient reports the following symptoms/concerns: She reports that the Cymbalta has been helping her pain in her legs and mood has improved. She explains that she can only recall one or two days in the last week where she has been unable to sleep. She notes that she feels depressed around two to three times a week due to the pandemic, missing her son, feels lonely, and the holidays. She explains that she is taking things day by day. She denies suicidal and homicidal thoughts.  Duration of problem: ; Severity of problem: moderate  OBJECTIVE: Mood: Euthymic and Affect: Appropriate Risk of harm to self or others: No plan to harm self or others  LIFE CONTEXT: Family and Social: see above. School/Work: see above. Self-Care: see above. Life Changes: see above.  GOALS ADDRESSED: Patient will: 1.  Reduce symptoms of: depression  2.  Increase knowledge and/or ability of: coping skills and self-management skills  3.  Demonstrate ability to: Increase healthy adjustment to current life circumstances  INTERVENTIONS: Interventions utilized:  Supportive Counseling was utilized by the clinician during today's follow up session. Clinician processed with the patient regarding how she has been doing since the last follow up session. Clinician explained to the patient that it sounds like she has been doing better with the Cymbalta in terms of being able to get adequate sleep at night and is less irritable. Clinician discussed the grieving process with the patient. Clinician encouraged the patient to continue to take her medications as prescribed, get a little bit of  exercise, and journal through out the week. Standardized Assessments completed: GAD-7 and PHQ 9  ASSESSMENT: Patient currently experiencing see above.  Patient may benefit from see above.  PLAN: 1. Follow up with behavioral health clinician on : Follow up in two weeks or earlier if needed. 2. Behavioral recommendations: see above. 3. Referral(s): [place] (In Clinic) 4. "From scale of 1-10, how likely are you to follow plan?":   Bayard Hugger, LCSW

## 2019-10-30 ENCOUNTER — Ambulatory Visit: Payer: Self-pay | Admitting: Endocrinology

## 2019-10-30 ENCOUNTER — Other Ambulatory Visit: Payer: Self-pay

## 2019-10-30 DIAGNOSIS — E109 Type 1 diabetes mellitus without complications: Secondary | ICD-10-CM

## 2019-10-30 DIAGNOSIS — E10618 Type 1 diabetes mellitus with other diabetic arthropathy: Secondary | ICD-10-CM

## 2019-10-30 DIAGNOSIS — I10 Essential (primary) hypertension: Secondary | ICD-10-CM

## 2019-10-30 DIAGNOSIS — M7500 Adhesive capsulitis of unspecified shoulder: Secondary | ICD-10-CM

## 2019-10-30 NOTE — Progress Notes (Signed)
Follow up Diabetes/ Endocrine [facility]     Patient ID: Courtney Stafford, female   DOB: 03/22/1971, 48 y.o.   MRN: [id number] Assessment:  Courtney Stafford is a 48 y.o. female who is seen in follow up for type 1 diabetes mellitus at the request of [place], Chioma E, NP.  Encounter Diagnoses Type 1 Diabetes Mellitus with complications (peripheral neuropathy)   Plan/Assessment:  1. Type 1 diabetes mellitus Patient currently takes 5-7u Novolog with meals with Lantus 30u daily. Has been 30u Lantus for the past 1 month. Has had hypoglycemic episodes within the recent weeks, approx 2-3 times a week in the morning, with resolution upon intake of orange juice or food. Improved peripheral neuropathy with duloxetine in addition to previous regimen of gabapentin. Diet could be improved. A1c of 8.4 on 10/10/2019 (compared to 8.2 in 06/2019). UACR of 37 in 06/2019, but already on lisinopril for HTN mgmt.  - Microalbumin, UACR due 06/2020 - Currently working with [facility] for ophtho appt for DR screening - Contact [place] for potentially getting patient CGM through medical management clinic or directly from manufacturer - Refill glucometer strips (to check blood glucose at least 4x a day)  - HbA1c due 12/2018  Patient Instructions  Take 25 units of Lantus daily at night time. Take 6-8 units of Novolog for meals (6 units for a smaller meal, 8 units for larger meals).  Call us if the blood glucose continues to be high or if you are still having episodes of low blood sugar.    No orders of the defined types were placed in this encounter.    Subjective:  Courtney Stafford is a 49 yo woman with PMHx of T1DM, HTN, depression who presents to clinic for f/u of diabetes. Her current medication regiment includes: 5-7u of Novolog for each meal (depending on meal), Lantus 30u at nighttime daily. Blood glucose levels have varied in the morning 56-67 in the morning for the past couple of days, and readings of 107-330  throughout the day/night. Symptomatic (feeling weak, shakes/chills, some lightheadedness, no passing out) when sugars are low in the 50s-60s. Will drink juice or eat a little snack when symptomatic. 2-3x of these symptoms in the past week, but state not very often that these symptoms happen. Latest food intake/snack around 10-11pm. States some tingling in fingers/toes, but has "calmed down a whole lot" in the past. Denies nocturia, no increase in thirst, no increase in frequency of urination. Diet consists of fried chicken, mashed potatoes, peas about 2-3x a week. Does not snack often, but will eat bag of chips or noodles as a snack. Beverages include water, diet Mountain Dew (drinks medium sized cup 2-3x a day). Has been trying to even out the beverage intake to have more water intake. Exercise includes walking around the block or to the store (20 min) 3-4x a week. Currently smoking about 2-3 cigarettes about 3-4x a week, which has decreased from the past. Denies CP, SOB, difficulty with urination or bowel movement, blood in the urine or stool, difficulty walking, diarrhea 3-4x a week   Review of Systems  Cardiovascular: Negative for chest pain.  Gastrointestinal: Positive for diarrhea.  Endocrine: Negative for polydipsia and polyuria.  Genitourinary: Negative for difficulty urinating, dysuria and hematuria.  Musculoskeletal: Negative for myalgias.  Neurological: Negative for light-headedness and headaches.    Courtney Stafford  has a past medical history of Diabetes mellitus without complication ([place]) and Hypertension.  Family History, Social History, current Medications and  allergies reviewed and updated in Epic.  Objective:  No physical exam or vitals given telemedicine visit.  Wt Readings from Last 3 Encounters:  10/17/19 210 lb (95.3 kg)  09/19/19 208 lb 1.6 oz (94.4 kg)  08/28/19 208 lb (94.3 kg)

## 2019-10-30 NOTE — Patient Instructions (Signed)
Take 25 units of Lantus daily at night time. Take 6-8 units of Novolog for meals (6 units for a smaller meal, 8 units for larger meals).  Call us if the blood glucose continues to be high or if you are still having episodes of low blood sugar.

## 2019-11-06 ENCOUNTER — Ambulatory Visit: Payer: Self-pay

## 2019-11-13 ENCOUNTER — Encounter: Payer: Self-pay | Admitting: *Deleted

## 2019-11-13 ENCOUNTER — Ambulatory Visit: Payer: Self-pay | Admitting: Licensed Clinical Social Worker

## 2019-11-13 ENCOUNTER — Other Ambulatory Visit: Payer: Self-pay

## 2019-11-13 DIAGNOSIS — F411 Generalized anxiety disorder: Secondary | ICD-10-CM

## 2019-11-13 DIAGNOSIS — F4310 Post-traumatic stress disorder, unspecified: Secondary | ICD-10-CM

## 2019-11-13 DIAGNOSIS — F331 Major depressive disorder, recurrent, moderate: Secondary | ICD-10-CM

## 2019-11-13 NOTE — BH Specialist Note (Signed)
Integrated Behavioral Health Follow Up Visit Via Phone  MRN: [id number] Name: Courtney Stafford  Type of Service: [place] Interpretor:No. Interpretor Name and Language: not applicable.   SUBJECTIVE: Courtney Stafford is a 48 y.o. female accompanied by herself. Patient was referred by endocrinology group for mental health.  Patient reports the following symptoms/concerns: She reports an improvement in her symptoms of anxiety. She explains that she will feel anxious around two to three times a week. She explains that her overall quality of sleep has improved since she started the Cymbalta. She explains that she has not been going out of her apartment as much due to the cold weather. She denies suicidal and homicidal thoughts.  Duration of problem: ; Severity of problem: mild  OBJECTIVE: Mood: Euthymic and Affect: Appropriate Risk of harm to self or others: No plan to harm self or others  LIFE CONTEXT: Family and Social: see above. School/Work: see above. Self-Care: see above. Life Changes: see above.   GOALS ADDRESSED: Patient will: 1.  Reduce symptoms of: depression  2.  Increase knowledge and/or ability of: healthy habits  3.  Demonstrate ability to: Increase healthy adjustment to current life circumstances  INTERVENTIONS: Interventions utilized:  Supportive Counseling was utilized by the clinician during today's follow up session. Clinician processed with the patient regarding how she has been doing since the last follow up session. Clinician administer the GAD 7 and PHQ 9. Clinician asked the patient if she has noticed any improvement in her quality of sleep since she started the Cymbalta. Clinician encouraged the patient to continue to utilize her coping skills and practice self care.  Standardized Assessments completed: GAD-7 and PHQ 9  ASSESSMENT: Patient currently experiencing see above.   Patient may benefit from see  above.  PLAN: 1. Follow up with behavioral health clinician on : two weeks or earlier if needed.  2. Behavioral recommendations: see above.  3. Referral(s): [place] (In Clinic) 4. "From scale of 1-10, how likely are you to follow plan?":   Bayard Hugger, LCSW

## 2019-11-14 ENCOUNTER — Other Ambulatory Visit: Payer: Self-pay

## 2019-11-14 DIAGNOSIS — F411 Generalized anxiety disorder: Secondary | ICD-10-CM

## 2019-11-14 MED ORDER — DULOXETINE HCL 60 MG PO CPEP: 60.0000 mg | ORAL_CAPSULE | Freq: Every morning | ORAL | 2 refills | Status: DC

## 2019-11-14 MED ORDER — MIRTAZAPINE 30 MG PO TABS: 30.0000 mg | ORAL_TABLET | Freq: Every day | ORAL | 2 refills | Status: DC

## 2019-11-19 ENCOUNTER — Telehealth: Payer: Self-pay | Admitting: Pharmacist

## 2019-11-19 NOTE — Telephone Encounter (Signed)
11/19/2019 11:14:41 AM - Lantus Vials Dose Change to provider  11/19/2019 I have received a dose change printout for Lantus Vials Inject 30 units under the skin at bedtime-sending [employer] refill request to [facility] for Lanora Manis to sign. [name]

## 2019-11-20 ENCOUNTER — Telehealth: Payer: Self-pay | Admitting: Pharmacist

## 2019-11-20 ENCOUNTER — Other Ambulatory Visit: Payer: Self-pay | Admitting: Gerontology

## 2019-11-20 DIAGNOSIS — I10 Essential (primary) hypertension: Secondary | ICD-10-CM

## 2019-11-20 NOTE — Telephone Encounter (Signed)
11/20/2019 Faxed [employer] a refill request for Lantus Vials Inject 30 units daily at bedtime #3 -Dose Change.[name]

## 2019-12-04 ENCOUNTER — Emergency Department: Payer: Self-pay

## 2019-12-04 ENCOUNTER — Ambulatory Visit: Payer: Self-pay | Admitting: Licensed Clinical Social Worker

## 2019-12-04 ENCOUNTER — Other Ambulatory Visit: Payer: Self-pay

## 2019-12-04 ENCOUNTER — Ambulatory Visit: Payer: Self-pay | Admitting: Gerontology

## 2019-12-04 ENCOUNTER — Encounter: Payer: Self-pay | Admitting: Emergency Medicine

## 2019-12-04 ENCOUNTER — Emergency Department
Admission: EM | Admit: 2019-12-04 | Discharge: 2019-12-04 | Disposition: A | Discharge status: Home / Self Care | Payer: Self-pay | Attending: Emergency Medicine | Admitting: Emergency Medicine

## 2019-12-04 DIAGNOSIS — M7989 Other specified soft tissue disorders: Secondary | ICD-10-CM | POA: Insufficient documentation

## 2019-12-04 DIAGNOSIS — Z794 Long term (current) use of insulin: Secondary | ICD-10-CM | POA: Insufficient documentation

## 2019-12-04 DIAGNOSIS — I10 Essential (primary) hypertension: Secondary | ICD-10-CM | POA: Insufficient documentation

## 2019-12-04 DIAGNOSIS — M79662 Pain in left lower leg: Secondary | ICD-10-CM | POA: Insufficient documentation

## 2019-12-04 DIAGNOSIS — E109 Type 1 diabetes mellitus without complications: Secondary | ICD-10-CM | POA: Insufficient documentation

## 2019-12-04 DIAGNOSIS — Z87891 Personal history of nicotine dependence: Secondary | ICD-10-CM | POA: Insufficient documentation

## 2019-12-04 DIAGNOSIS — Z79899 Other long term (current) drug therapy: Secondary | ICD-10-CM | POA: Insufficient documentation

## 2019-12-04 DIAGNOSIS — M25562 Pain in left knee: Secondary | ICD-10-CM | POA: Insufficient documentation

## 2019-12-04 IMAGING — CR DG KNEE COMPLETE 4+V*L*
4 series · 4 of 4 positions shown · non-contrast
Comparison: [DATE]

CLINICAL DATA: Knee pain and swelling.

EXAM:
LEFT KNEE - COMPLETE 4+ VIEW

[knee ap]
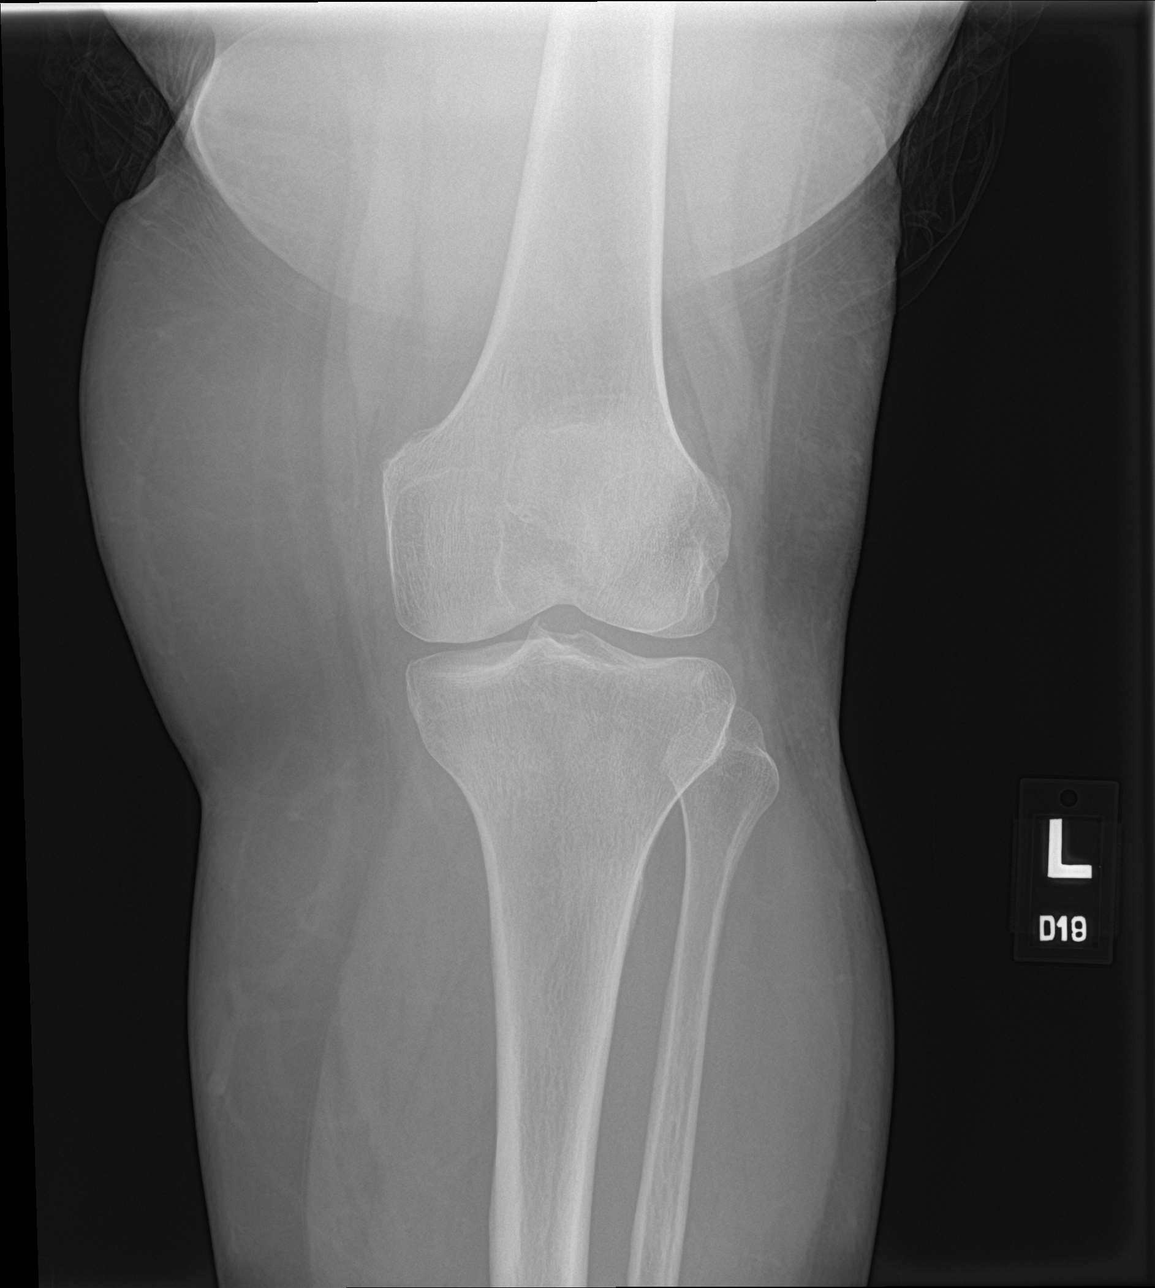

[knee obl (1 of 2)]
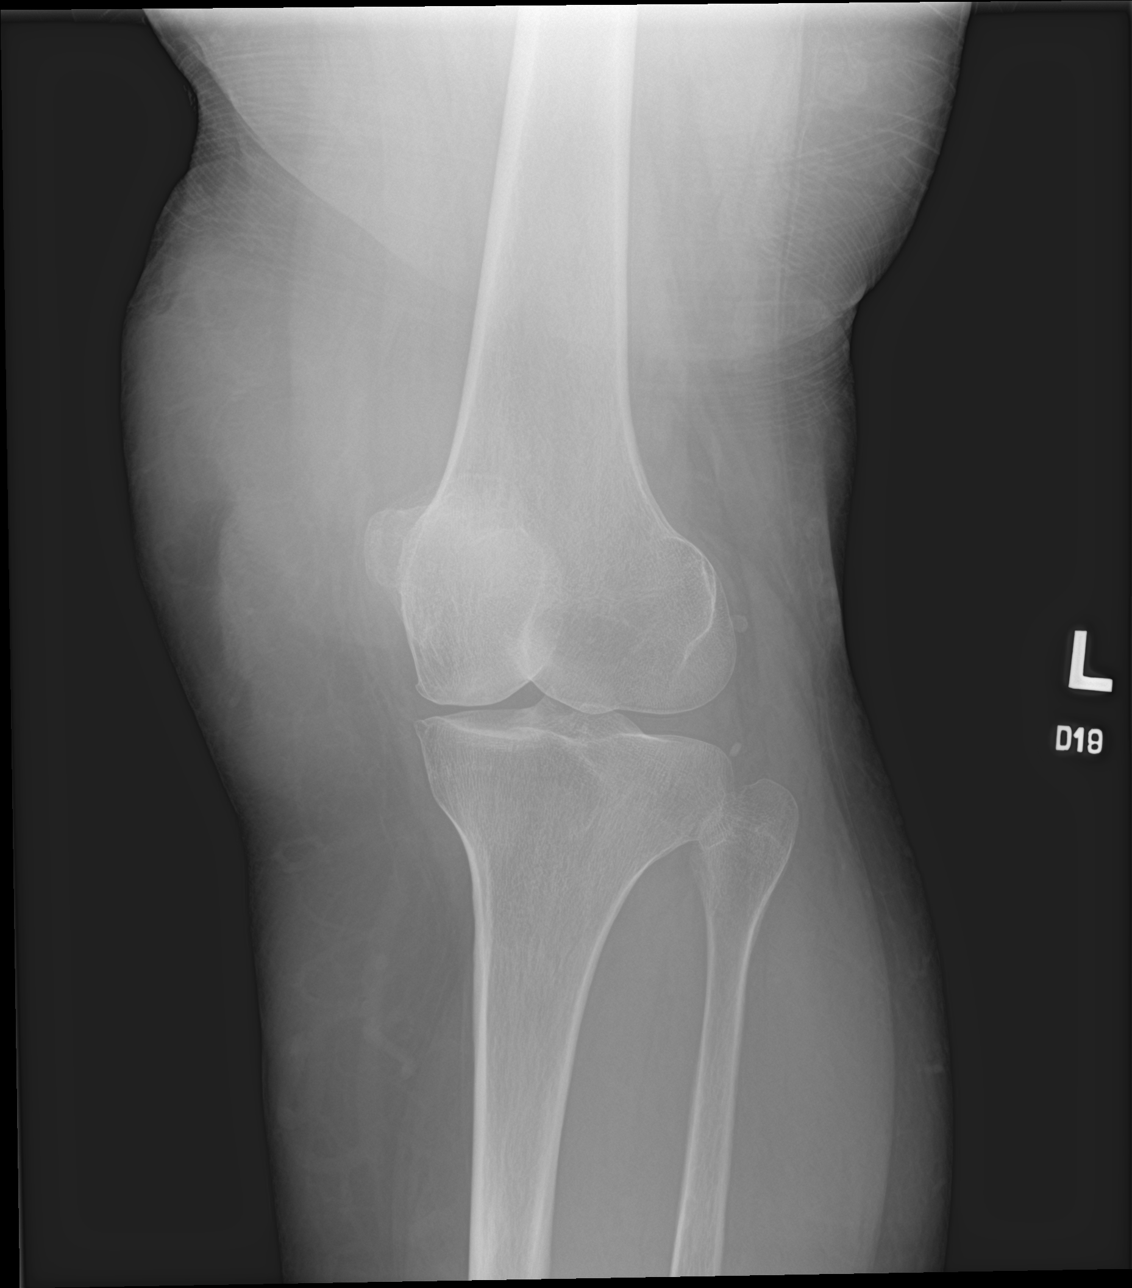

[knee obl (2 of 2)]
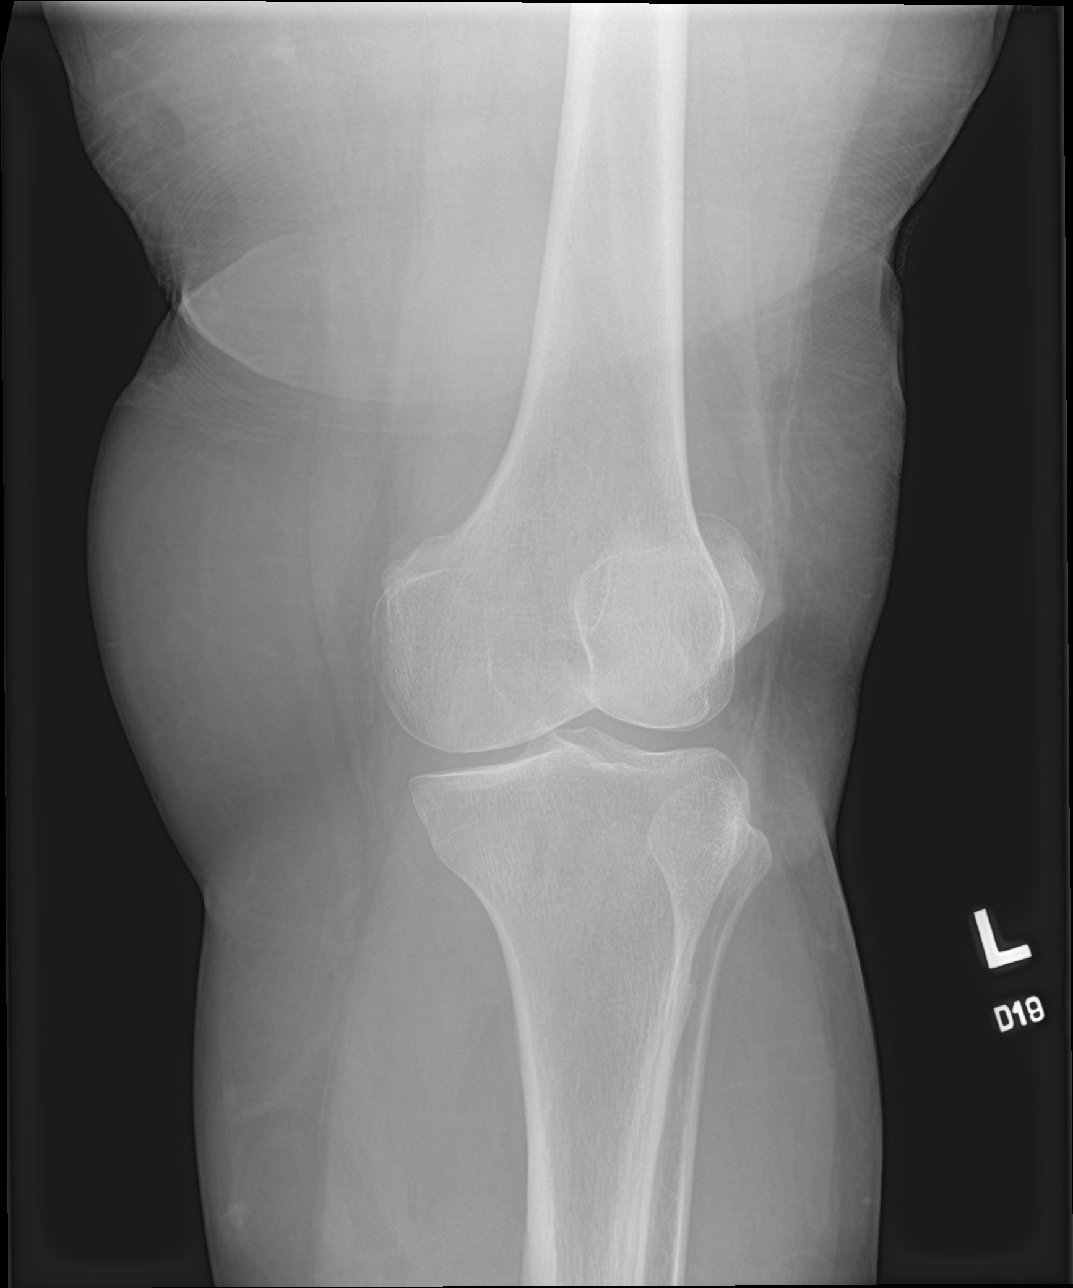

[knee lat]
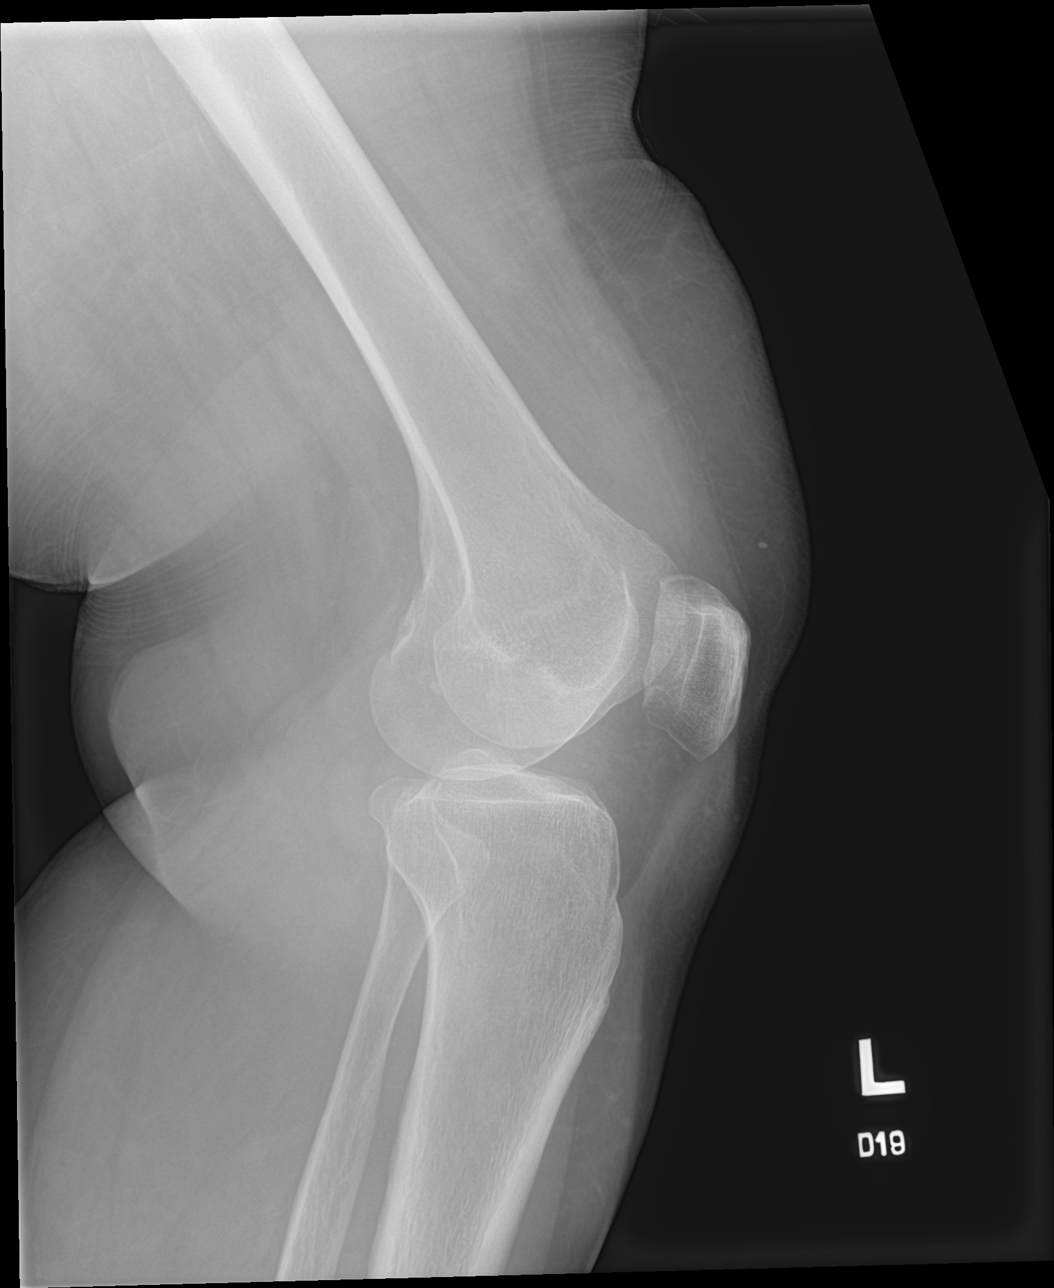

[4 of 4 positions shown; findings below may reference images not displayed]

FINDINGS: The joint spaces are maintained. No acute bony findings or
destructive bony changes. No osteochondral lesion or
chondrocalcinosis. I do not have a true lateral film but no obvious
joint effusion.
IMPRESSION: Normal knee radiographs. No acute bony findings or significant
degenerative changes.

## 2019-12-04 IMAGING — US US EXTREM LOW VENOUS*L*
1 series · 13 of 24 positions shown · non-contrast
Comparison: None.

CLINICAL DATA: Left lower extremity pain and edema



[Series 1: us extrem low venous*left* · 13 of 33 slices shown]
[im 1/33]
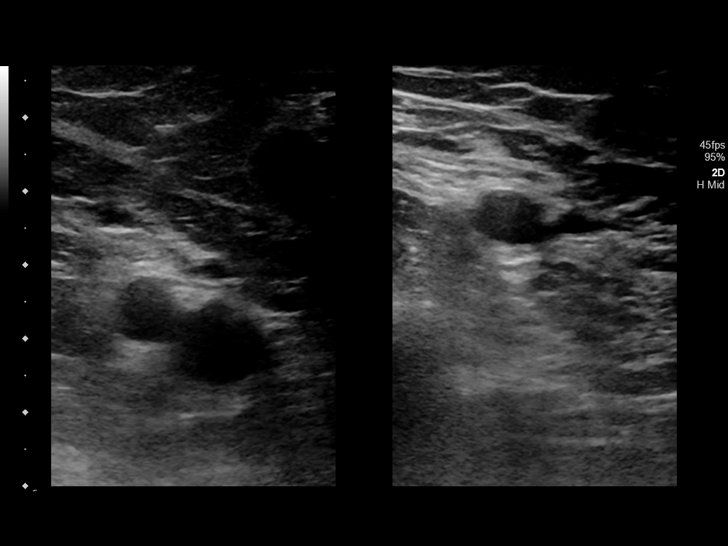
[im 3/33]
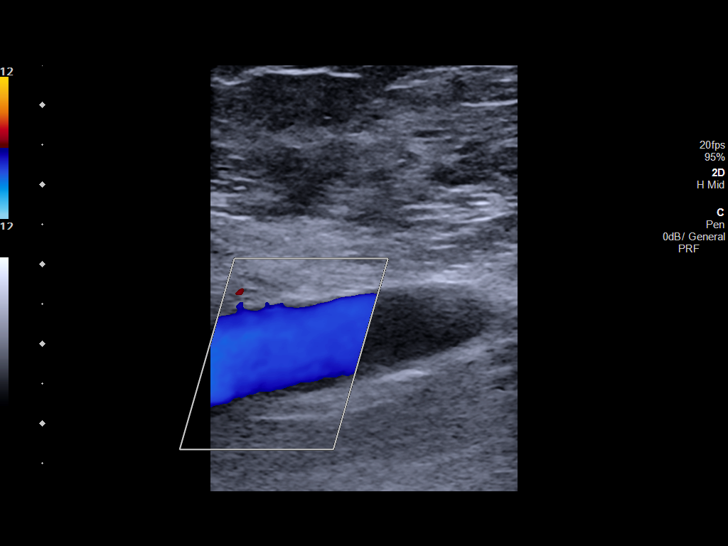
[im 6/33]
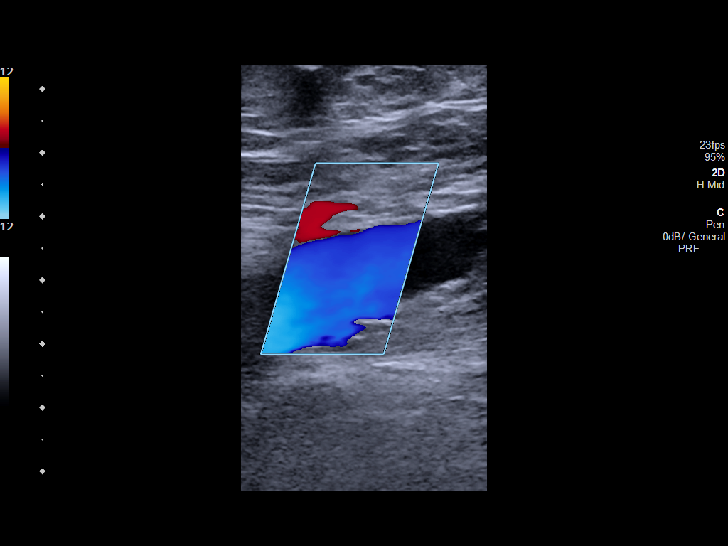
[im 9/33]
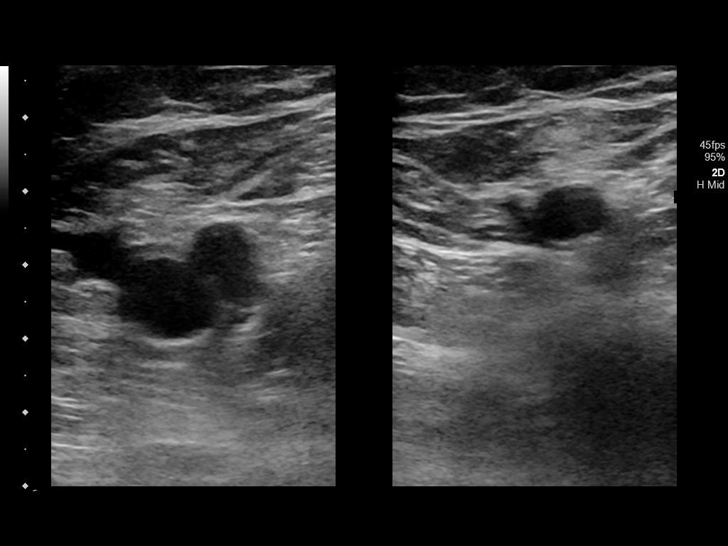
[im 12/33]
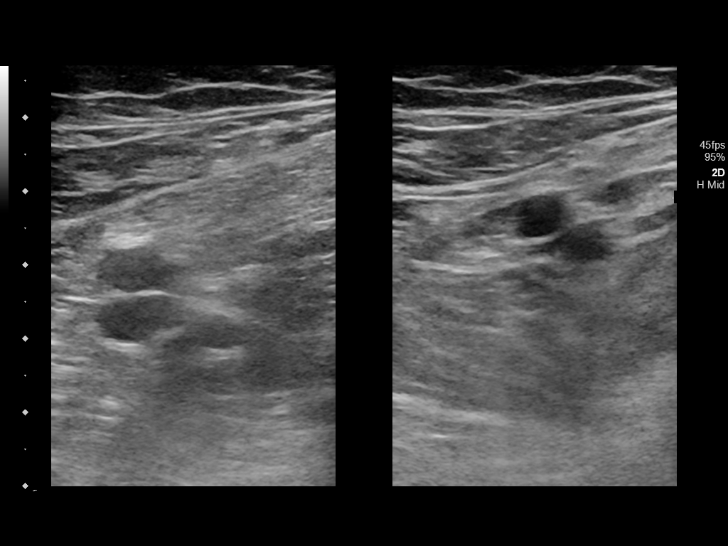
[im 14/33]
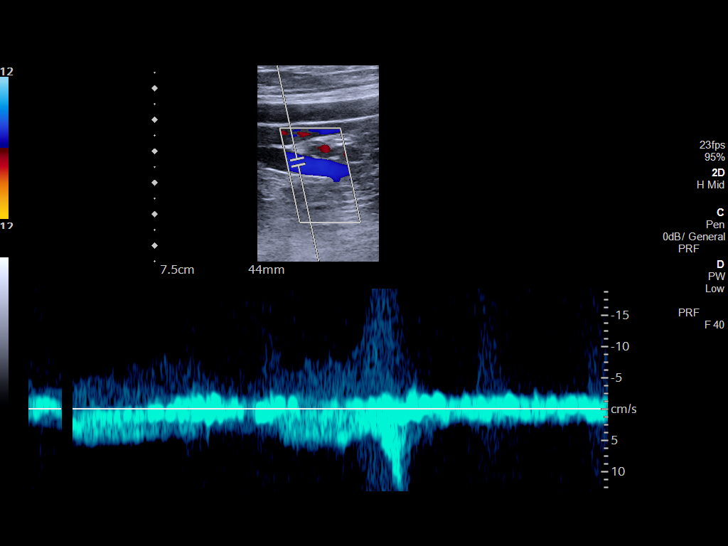
[im 17/33]
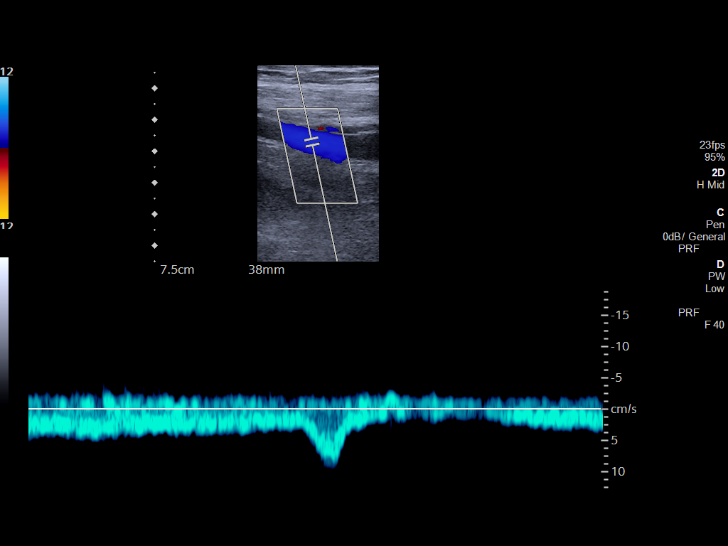
[im 19/33]
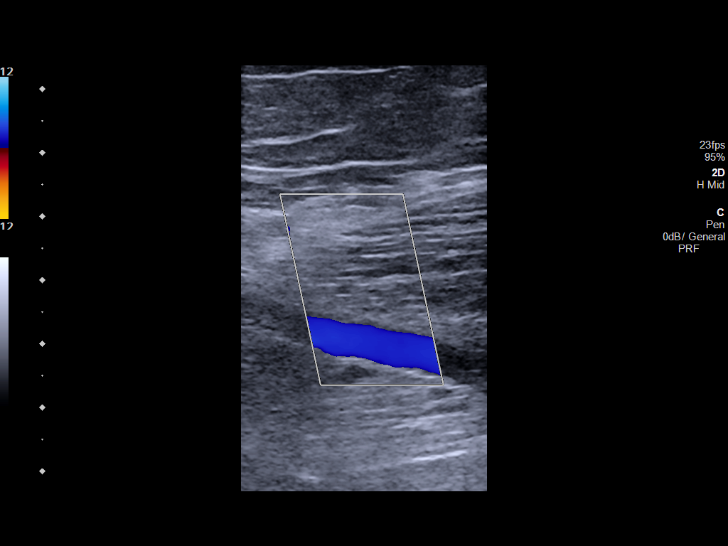
[im 21/33]
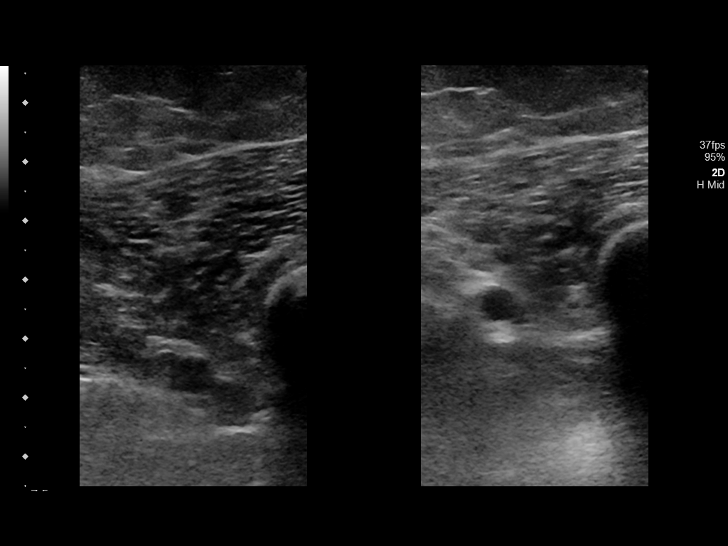
[im 24/33]
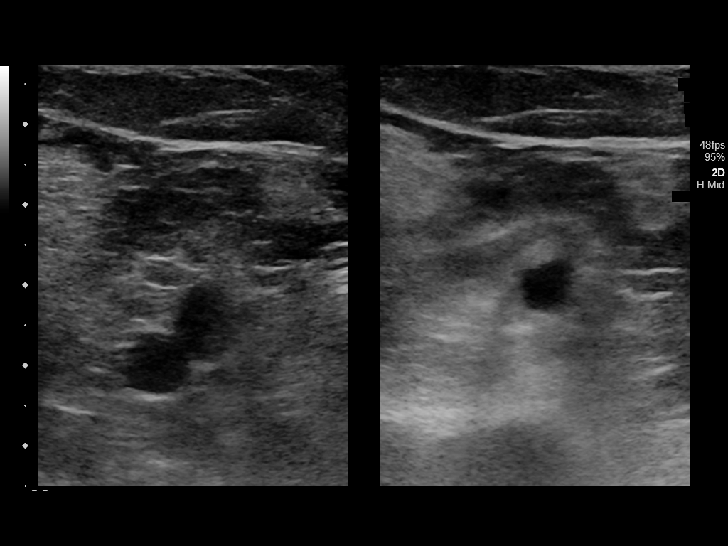
[im 27/33]
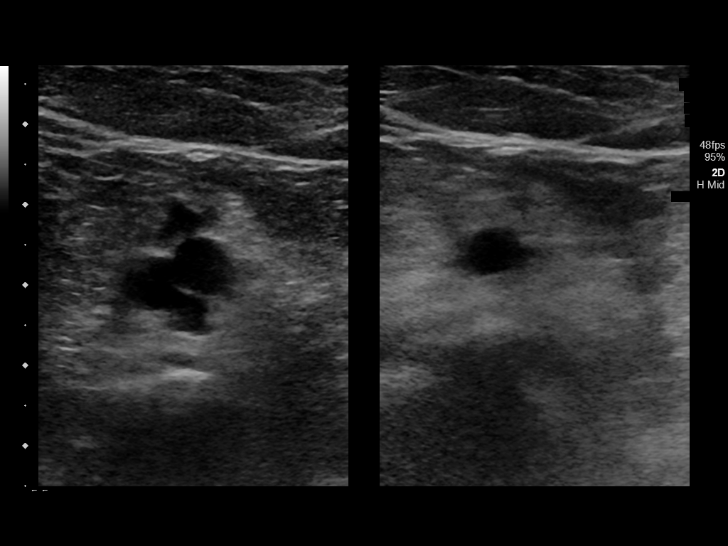
[im 30/33]
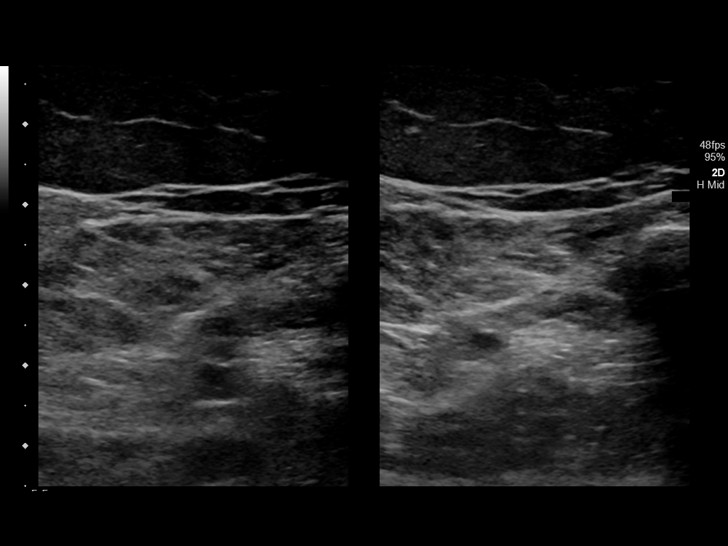
[im 33/33]
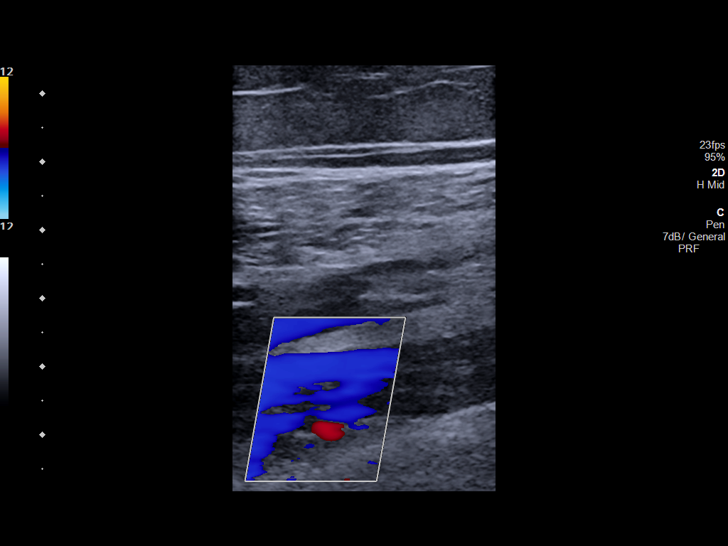

[13 of 24 positions shown; findings below may reference images not displayed]

FINDINGS: Contralateral Common Femoral Vein: Respiratory phasicity is normal
and symmetric with the symptomatic side. No evidence of thrombus.
Normal compressibility.

Common Femoral Vein: No evidence of thrombus. Normal
compressibility, respiratory phasicity and response to augmentation.

Saphenofemoral Junction: No evidence of thrombus. Normal
compressibility and flow on color Doppler imaging.

Profunda Femoral Vein: No evidence of thrombus. Normal
compressibility and flow on color Doppler imaging.

Femoral Vein: No evidence of thrombus. Normal compressibility,
respiratory phasicity and response to augmentation.

Popliteal Vein: No evidence of thrombus. Normal compressibility,
respiratory phasicity and response to augmentation.

Calf Veins: No evidence of thrombus. Normal compressibility and flow
on color Doppler imaging.
IMPRESSION: No evidence of deep venous thrombosis.

## 2019-12-04 MED ORDER — MELOXICAM 7.5 MG PO TABS: 7.5000 mg | ORAL_TABLET | Freq: Every day | ORAL | 0 refills | Status: DC

## 2019-12-04 MED ORDER — BACLOFEN 5 MG PO TABS: 5.0000 mg | ORAL_TABLET | Freq: Two times a day (BID) | ORAL | 0 refills | Status: DC | PRN

## 2019-12-04 NOTE — ED Provider Notes (Signed)
[facility] Emergency Department Provider Note  ____________________________________________  Time seen: Approximately 4:14 PM  I have reviewed the triage vital signs and the nursing notes.   HISTORY  Chief Complaint Knee Pain    HPI Courtney Stafford is a 49 y.o. female that presents to the emergency department for evaluation of left knee pain that radiates into the back of her thigh and calf for 2 days.  Patient states that she was stretching her left leg on Sunday when she felt a pop to her knee.  Pain is worse to the back of her knee and back of her leg.  She is unable to tell if her knee is swollen.  Patient called EMS to come to the emergency department because she was in too much pain to walk to the bus stop.  No fevers, back pain, hip pain, ankle swelling.  Past Medical History:  Diagnosis Date  . Diabetes mellitus without complication (HCC)   . Hypertension     Patient Active Problem List   Diagnosis Date Noted  . Swelling of lower leg 12/04/2019  . Pain and swelling of lower leg, left 12/04/2019  . Insomnia 08/28/2019  . 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 HTN (hypertension) 10/22/2015  . Type 1 diabetes (HCC) 10/22/2015  . Anemia 10/22/2015  . Restless legs 10/22/2015  . Hematuria 04/09/2015    Past Surgical History:  Procedure Laterality Date  . CARPAL TUNNEL RELEASE  6 years ago   both hands    Prior to Admission medications   Medication Sig Start Date End Date Taking? Authorizing Provider  amLODipine (NORVASC) 10 MG tablet TAKE ONE TABLET BY MOUTH EVERY DAY 11/21/19   Virl Axe, MD  atorvastatin (LIPITOR) 10 MG tablet Take 1 tablet (10 mg total) by mouth daily. 08/07/19   Iloabachie, Chioma E, NP  Baclofen 5 MG TABS Take 5 mg by mouth 2 (two) times daily as needed. 12/04/19   Enid Derry, PA-C  Blood Glucose Monitoring Suppl Supplies MISC 1 each by Does not apply route 5 (five)  times daily. 10/03/18   Arrie Aran, MD  carvedilol (COREG) 12.5 MG tablet Take 1 tablet (12.5 mg total) by mouth 2 (two) times daily with a meal. 10/17/19   Iloabachie, Chioma E, NP  DULoxetine (CYMBALTA) 60 MG capsule Take 1 capsule (60 mg total) by mouth every morning. 11/14/19   Iloabachie, Chioma E, NP  gabapentin (NEURONTIN) 300 MG capsule TAKE 2 CAPSULES BYMOUTH 2 TIMES A DAY AND 3 CAPSULES AT NIGHT 10/17/19   Iloabachie, Chioma E, NP  hydrochlorothiazide (HYDRODIURIL) 25 MG tablet Take 1 tablet (25 mg total) by mouth daily. 10/17/19   Iloabachie, Chioma E, NP  insulin aspart (NOVOLOG FLEXPEN) 100 UNIT/ML FlexPen She states taking 5-7 units with meals and adjust with sliding scale as prescribed by Endocrinology.. 10/18/19   Iloabachie, Chioma E, NP  Insulin Glargine (LANTUS) 100 UNIT/ML Solostar Pen Inject 30 Units into the skin at bedtime. 09/11/19   Iloabachie, Chioma E, NP  Insulin Pen Needle (BD PEN NEEDLE NANO U/F) 32G X 4 MM MISC 5 times daily 10/04/17   Maryjo Rochester, MD  lisinopril (PRINIVIL,ZESTRIL) 40 MG tablet Take 1 tablet (40 mg total) by mouth daily. 10/25/18   Virl Axe, MD  meloxicam (MOBIC) 7.5 MG tablet Take 1 tablet (7.5 mg total) by mouth daily. 12/04/19 12/03/20  Enid Derry, PA-C  mirtazapine (REMERON) 30 MG tablet Take 1 tablet (  30 mg total) by mouth at bedtime. 11/14/19 12/14/19  Iloabachie, Chioma E, NP  vitamin B-12 (CYANOCOBALAMIN) 500 MCG tablet Take 1 tablet (500 mcg total) by mouth daily. 10/03/18   Arrie Aran, MD    Allergies Phenergan [promethazine]  Family History  Problem Relation Age of Onset  . Cancer Father   . Breast cancer Neg Hx     Social History Social History   Tobacco Use  . Smoking status: Current Some Day Smoker    Packs/day: 0.10  . Smokeless tobacco: Former Engineer, water Use Topics  . Alcohol use: No  . Drug use: No     Review of Systems  Constitutional: No fever/chills Respiratory: No SOB. Gastrointestinal: No nausea,  no vomiting.  Musculoskeletal: Positive for knee and leg pain.  Negative for hip or back pain. Skin: Negative for rash, abrasions, lacerations, ecchymosis. Neurological: Negative for numbness or tingling   ____________________________________________   PHYSICAL EXAM:  VITAL SIGNS: ED Triage Vitals  Enc Vitals Group     BP 12/04/19 1511 126/80     Pulse Rate 12/04/19 1511 77     Resp 12/04/19 1511 18     Temp 12/04/19 1511 98.6 F (37 C)     Temp Source 12/04/19 1511 Oral     SpO2 12/04/19 1511 98 %     Weight 12/04/19 1512 200 lb (90.7 kg)     Height 12/04/19 1512 5' (1.524 m)     Head Circumference --      Peak Flow --      Pain Score 12/04/19 1516 5     Pain Loc --      Pain Edu? --      Excl. in GC? --      Constitutional: Alert and oriented. Well appearing and in no acute distress. Eyes: Conjunctivae are normal. PERRL. EOMI. Head: Atraumatic. ENT:      Ears:      Nose: No congestion/rhinnorhea.      Mouth/Throat: Mucous membranes are moist.  Neck: No stridor.  Cardiovascular: Normal rate, regular rhythm.  Good peripheral circulation. Respiratory: Normal respiratory effort without tachypnea or retractions. Lungs CTAB. Good air entry to the bases with no decreased or absent breath sounds. Musculoskeletal: Full range of motion to all extremities. No gross deformities appreciated.  Tenderness to palpation to popliteal fossa.  Full range of motion of knee.  Strength equal in lower extremities bilaterally.  No ankle swelling. Neurologic:  Normal speech and language. No gross focal neurologic deficits are appreciated.  Skin:  Skin is warm, dry and intact. No rash noted. Psychiatric: Mood and affect are normal. Speech and behavior are normal. Patient exhibits appropriate insight and judgement.   ____________________________________________   LABS (all labs ordered are listed, but only abnormal results are displayed)  Labs Reviewed - No data to  display ____________________________________________  EKG   ____________________________________________  RADIOLOGY Lexine Baton, personally viewed and evaluated these images (plain radiographs) as part of my medical decision making, as well as reviewing the written report by the radiologist.  US Venous Img Lower Unilateral Left  Result Date: 12/04/2019 CLINICAL DATA:  Left lower extremity pain and edema EXAM: LEFT LOWER EXTREMITY VENOUS DOPPLER ULTRASOUND TECHNIQUE: Gray-scale sonography with graded compression, as well as color Doppler and duplex ultrasound were performed to evaluate the lower extremity deep venous systems from the level of the common femoral vein and including the common femoral, femoral, profunda femoral, popliteal and calf veins including the posterior tibial, peroneal and  gastrocnemius veins when visible. The superficial great saphenous vein was also interrogated. Spectral Doppler was utilized to evaluate flow at rest and with distal augmentation maneuvers in the common femoral, femoral and popliteal veins. COMPARISON:  None. FINDINGS: Contralateral Common Femoral Vein: Respiratory phasicity is normal and symmetric with the symptomatic side. No evidence of thrombus. Normal compressibility. Common Femoral Vein: No evidence of thrombus. Normal compressibility, respiratory phasicity and response to augmentation. Saphenofemoral Junction: No evidence of thrombus. Normal compressibility and flow on color Doppler imaging. Profunda Femoral Vein: No evidence of thrombus. Normal compressibility and flow on color Doppler imaging. Femoral Vein: No evidence of thrombus. Normal compressibility, respiratory phasicity and response to augmentation. Popliteal Vein: No evidence of thrombus. Normal compressibility, respiratory phasicity and response to augmentation. Calf Veins: No evidence of thrombus. Normal compressibility and flow on color Doppler imaging. IMPRESSION: No evidence of deep  venous thrombosis. Electronically Signed   By: Judie Petit.  Shick M.D.   On: 12/04/2019 16:44   DG Knee Complete 4 Views Left  Result Date: 12/04/2019 CLINICAL DATA:  Knee pain and swelling. EXAM: LEFT KNEE - COMPLETE 4+ VIEW COMPARISON:  02/05/2006 FINDINGS: The joint spaces are maintained. No acute bony findings or destructive bony changes. No osteochondral lesion or chondrocalcinosis. I do not have a true lateral film but no obvious joint effusion. IMPRESSION: Normal knee radiographs. No acute bony findings or significant degenerative changes. Electronically Signed   By: Rudie Meyer M.D.   On: 12/04/2019 15:42    ____________________________________________    PROCEDURES  Procedure(s) performed:    Procedures    Medications - No data to display   ____________________________________________   INITIAL IMPRESSION / ASSESSMENT AND PLAN / ED COURSE  Pertinent labs & imaging results that were available during my care of the patient were reviewed by me and considered in my medical decision making (see chart for details).  Review of the [place] CSRS was performed in accordance of the [facility] prior to dispensing any controlled drugs.  Patient presented to the emergency department for evaluation of knee pain.  Vital signs and exam are reassuring.  X-ray negative for acute bony abnormality.  Ultrasound negative for DVT.  Knee was Ace wrapped.  Patient declines walker.  Patient will be discharged home with prescriptions for baclofen and Mobic. Patient is to follow up with PCP as directed. Patient is given ED precautions to return to the ED for any worsening or new symptoms.   Courtney Stafford was evaluated in Emergency Department on 12/04/2019 for the symptoms described in the history of present illness. She was evaluated in the context of the global COVID-19 pandemic, which necessitated consideration that the patient might be at risk for infection with the SARS-CoV-2 virus that causes COVID-19.  Institutional protocols and algorithms that pertain to the evaluation of patients at risk for COVID-19 are in a state of rapid change based on information released by regulatory bodies including the CDC and federal and state organizations. These policies and algorithms were followed during the patient's care in the ED.  ____________________________________________  FINAL CLINICAL IMPRESSION(S) / ED DIAGNOSES  Final diagnoses:  Acute pain of left knee      NEW MEDICATIONS STARTED DURING THIS VISIT:  ED Discharge Orders         Ordered    Baclofen 5 MG TABS  2 times daily PRN     12/04/19 1700    meloxicam (MOBIC) 7.5 MG tablet  Daily     12/04/19 1700  This chart was dictated using voice recognition software/Dragon. Despite best efforts to proofread, errors can occur which can change the meaning. Any change was purely unintentional.    Laban Emperor, PA-C 12/04/19 2249    Delman Kitten, MD 12/04/19 2306

## 2019-12-04 NOTE — ED Triage Notes (Signed)
Patient reports pain in left knee and leg that started yesterday. Reports she did feel a pop in her knee prior to pain starting, however her PCP encouraged her to come get evaluated.

## 2019-12-04 NOTE — ED Notes (Signed)
See triage note  States she felt a pop in her left knee on Sunday   Then yesterday states her knee felt like jello  States pain is all the way around her knee  Good pulses

## 2019-12-04 NOTE — ED Triage Notes (Signed)
First nurse note- here for left knee pain with redness and warmth.  PCP sent to ED for eval. VSS with EMS

## 2019-12-04 NOTE — Progress Notes (Signed)
Established Patient Office Visit  Subjective:  Patient ID: Courtney Stafford, female    DOB: Oct 28, 1971  Age: 49 y.o. MRN: [id number]  CC: No chief complaint on file. Patient consents to telephone visit and 2 patient identifiers was used to identify patient.  HPI Courtney Stafford presents via telephone visit for c/o of swollen left leg.She states that she felt  a pop on her left knee while sitting on the chair and she woke up on Monday morning with swelling to her left lower leg. She states that she has being experiencing constant achy pain, and numbness to her left lower leg. She also admits that she can't bear weight on her left leg and slides her left leg while walking. She denies chest pain, palpitation, shortness of breath, fever and chills.She states that she's concerned about her leg.  Past Medical History:  Diagnosis Date  . Diabetes mellitus without complication (HCC)   . Hypertension     Past Surgical History:  Procedure Laterality Date  . CARPAL TUNNEL RELEASE  6 years ago   both hands    Family History  Problem Relation Age of Onset  . Cancer Father   . Breast cancer Neg Hx     Social History   Socioeconomic History  . Marital status: Single    Spouse name: Not on file  . Number of children: Not on file  . Years of education: Not on file  . Highest education level: High school graduate  Occupational History  . Occupation: unemployed  Tobacco Use  . Smoking status: Current Some Day Smoker    Packs/day: 0.10  . Smokeless tobacco: Former Engineer, water and Sexual Activity  . Alcohol use: No  . Drug use: No  . Sexual activity: Not on file  Other Topics Concern  . Not on file  Social History Narrative   Completed biopsychosocial assessment, social determinants screening, administered PHQ 9, and GAD 7.       Transportation is a barrier to appointments. Receives food stamps 192 per month but worries about running out.       Support system is small.    Social Determinants of Health   Financial Resource Strain:   . Difficulty of Paying Living Expenses: Not on file  Food Insecurity:   . Worried About Programme researcher, broadcasting/film/video in the Last Year: Not on file  . Ran Out of Food in the Last Year: Not on file  Transportation Needs:   . Lack of Transportation (Medical): Not on file  . Lack of Transportation (Non-Medical): Not on file  Physical Activity:   . Days of Exercise per Week: Not on file  . Minutes of Exercise per Session: Not on file  Stress:   . Feeling of Stress : Not on file  Social Connections:   . Frequency of Communication with Friends and Family: Not on file  . Frequency of Social Gatherings with Friends and Family: Not on file  . Attends Religious Services: Not on file  . Active Member of Clubs or Organizations: Not on file  . Attends Banker Meetings: Not on file  . Marital Status: Not on file  Intimate Partner Violence:   . Fear of Current or Ex-Partner: Not on file  . Emotionally Abused: Not on file  . Physically Abused: Not on file  . Sexually Abused: Not on file    Outpatient Medications Prior to Visit  Medication Sig Dispense Refill  . amLODipine (NORVASC) 10 MG  tablet TAKE ONE TABLET BY MOUTH EVERY DAY 90 tablet 0  . atorvastatin (LIPITOR) 10 MG tablet Take 1 tablet (10 mg total) by mouth daily. 30 tablet 3  . Blood Glucose Monitoring Suppl Supplies MISC 1 each by Does not apply route 5 (five) times daily. 500 each 4  . carvedilol (COREG) 12.5 MG tablet Take 1 tablet (12.5 mg total) by mouth 2 (two) times daily with a meal. 60 tablet 3  . DULoxetine (CYMBALTA) 60 MG capsule Take 1 capsule (60 mg total) by mouth every morning. 30 capsule 2  . gabapentin (NEURONTIN) 300 MG capsule TAKE 2 CAPSULES BYMOUTH 2 TIMES A DAY AND 3 CAPSULES AT NIGHT 98 capsule 1  . hydrochlorothiazide (HYDRODIURIL) 25 MG tablet Take 1 tablet (25 mg total) by mouth daily. 90 tablet 1  . insulin aspart (NOVOLOG FLEXPEN) 100  UNIT/ML FlexPen She states taking 5-7 units with meals and adjust with sliding scale as prescribed by Endocrinology.. 75 mL 3  . Insulin Glargine (LANTUS) 100 UNIT/ML Solostar Pen Inject 30 Units into the skin at bedtime. 30 mL 4  . Insulin Pen Needle (BD PEN NEEDLE NANO U/F) 32G X 4 MM MISC 5 times daily 200 each 12  . lisinopril (PRINIVIL,ZESTRIL) 40 MG tablet Take 1 tablet (40 mg total) by mouth daily. 90 tablet 4  . mirtazapine (REMERON) 30 MG tablet Take 1 tablet (30 mg total) by mouth at bedtime. 30 tablet 2  . vitamin B-12 (CYANOCOBALAMIN) 500 MCG tablet Take 1 tablet (500 mcg total) by mouth daily. 30 tablet 3   No facility-administered medications prior to visit.    Allergies  Allergen Reactions  . Phenergan [Promethazine] Hives    ROS Review of Systems  Respiratory: Negative.   Cardiovascular: Positive for leg swelling (left leg edema).  Skin: Negative.   Neurological: Positive for numbness (to left leg).  Psychiatric/Behavioral: Negative.       Objective:    Physical Exam No Physical exam was done LMP 01/03/2016 (Within Years)  Wt Readings from Last 3 Encounters:  12/04/19 200 lb (90.7 kg)  10/17/19 210 lb (95.3 kg)  09/19/19 208 lb 1.6 oz (94.4 kg)     Health Maintenance Due  Topic Date Due  . HIV Screening  07/04/1984  . TETANUS/TDAP  07/04/1988  . OPHTHALMOLOGY EXAM  03/23/2019  . COLONOSCOPY  07/05/2019    There are no preventive care reminders to display for this patient.  Lab Results  Component Value Date   TSH 0.693 06/27/2019   Lab Results  Component Value Date   WBC 9.9 06/27/2019   HGB 15.8 06/27/2019   HCT 49.3 (H) 06/27/2019   MCV 91 06/27/2019   PLT 340 06/27/2019   Lab Results  Component Value Date   NA 138 10/10/2019   K 3.7 10/10/2019   CO2 25 10/10/2019   GLUCOSE 309 (H) 10/10/2019   BUN 11 10/10/2019   CREATININE 0.89 10/10/2019   BILITOT 0.5 06/27/2019   ALKPHOS 114 06/27/2019   AST 13 06/27/2019   ALT 11 06/27/2019    PROT 6.8 06/27/2019   ALBUMIN 4.2 06/27/2019   CALCIUM 9.2 10/10/2019   ANIONGAP 9 02/12/2014   Lab Results  Component Value Date   CHOL 151 06/27/2019   Lab Results  Component Value Date   HDL 60 06/27/2019   Lab Results  Component Value Date   LDLCALC 68 06/27/2019   Lab Results  Component Value Date   TRIG 115 06/27/2019   Lab Results  Component Value Date   CHOLHDL 2.5 06/27/2019   Lab Results  Component Value Date   HGBA1C 8.4 (H) 10/10/2019      Assessment & Plan:   1. Pain and swelling of lower leg, left - She might have possible DVT, she was advised to got to the ED for evaluation and she verbalized understanding.     Follow-up: Return in about 1 week (around 12/11/2019), or if symptoms worsen or fail to improve.    Derreck Wiltsey Jerold Coombe, NP

## 2019-12-11 ENCOUNTER — Ambulatory Visit: Payer: Self-pay | Admitting: Gerontology

## 2019-12-11 ENCOUNTER — Other Ambulatory Visit: Payer: Self-pay

## 2019-12-11 DIAGNOSIS — M25562 Pain in left knee: Secondary | ICD-10-CM

## 2019-12-11 NOTE — Progress Notes (Signed)
Established Patient Office Visit  Subjective:  Patient ID: Courtney Stafford, female    DOB: 09/24/1971  Age: 49 y.o. MRN: [id number]  CC: No chief complaint on file. Patient consents to telephone visit and 2 patient identifiers was used to identify patient.  HPI ERETRIA MANTERNACH presents for c/o of left knee pain. She was seen at the ED for left knee pain on 12/04/2019, Venous ultra sound showed no evidence of deep venous thrombosis and  DG of left knee showed no acute bony findings or significant degenerative changes. During her ED visit, 7.5 mg Meloxicam daily and Baclofen 5 mg bid as needed was prescribed, but she reports that she started taking medication yesterday on 12/10/2019.  Currently, she states that she continues to experience constant throbbing 8/10 pain to left knee at rest and pain intensity increases to 10/10 with ambulation. She states that pain radiates to left lower leg, and it feels like the front of her knee is bruised. She states that it hurts when she ambulates. She denies any relieving factor. She denies chest pain, palpitation, light headedness, fever and chills. Overall she's concerned about her left knee pain and she offers no further complaint.  Past Medical History:  Diagnosis Date  . Diabetes mellitus without complication ([place])   . Hypertension     Past Surgical History:  Procedure Laterality Date  . CARPAL TUNNEL RELEASE  6 years ago   both hands    Family History  Problem Relation Age of Onset  . Cancer Father   . Breast cancer Neg Hx     Social History   Socioeconomic History  . Marital status: Single    Spouse name: Not on file  . Number of children: Not on file  . Years of education: Not on file  . Highest education level: High school graduate  Occupational History  . Occupation: unemployed  Tobacco Use  . Smoking status: Current Some Day Smoker    Packs/day: 0.10  . Smokeless tobacco: Former Network engineer and Sexual Activity  . Alcohol  use: No  . Drug use: No  . Sexual activity: Not on file  Other Topics Concern  . Not on file  Social History Narrative   Completed biopsychosocial assessment, social determinants screening, administered PHQ 9, and GAD 7.       Transportation is a barrier to appointments. [place] [address] per month but worries about running out.       Support system is small.   Social Determinants of Health   Financial Resource Strain:   . Difficulty of Paying Living Expenses: Not on file  Food Insecurity:   . Worried About Charity fundraiser in the Last Year: Not on file  . Ran Out of Food in the Last Year: Not on file  Transportation Needs:   . Lack of Transportation (Medical): Not on file  . Lack of Transportation (Non-Medical): Not on file  Physical Activity:   . Days of Exercise per Week: Not on file  . Minutes of Exercise per Session: Not on file  Stress:   . Feeling of Stress : Not on file  Social Connections:   . Frequency of Communication with Friends and Family: Not on file  . Frequency of Social Gatherings with Friends and Family: Not on file  . Attends Religious Services: Not on file  . Active Member of Clubs or Organizations: Not on file  . Attends Archivist Meetings: Not on file  .  Marital Status: Not on file  Intimate Partner Violence:   . Fear of Current or Ex-Partner: Not on file  . Emotionally Abused: Not on file  . Physically Abused: Not on file  . Sexually Abused: Not on file    Outpatient Medications Prior to Visit  Medication Sig Dispense Refill  . amLODipine (NORVASC) 10 MG tablet TAKE ONE TABLET BY MOUTH EVERY DAY 90 tablet 0  . atorvastatin (LIPITOR) 10 MG tablet Take 1 tablet (10 mg total) by mouth daily. 30 tablet 3  . Baclofen 5 MG TABS Take 5 mg by mouth 2 (two) times daily as needed. 14 tablet 0  . Blood Glucose Monitoring Suppl Supplies MISC 1 each by Does not apply route 5 (five) times daily. 500 each 4  . carvedilol (COREG) 12.5 MG  tablet Take 1 tablet (12.5 mg total) by mouth 2 (two) times daily with a meal. 60 tablet 3  . DULoxetine (CYMBALTA) 60 MG capsule Take 1 capsule (60 mg total) by mouth every morning. 30 capsule 2  . gabapentin (NEURONTIN) 300 MG capsule TAKE 2 CAPSULES BYMOUTH 2 TIMES A DAY AND 3 CAPSULES AT NIGHT 98 capsule 1  . hydrochlorothiazide (HYDRODIURIL) 25 MG tablet Take 1 tablet (25 mg total) by mouth daily. 90 tablet 1  . insulin aspart (NOVOLOG FLEXPEN) 100 UNIT/ML FlexPen She states taking 5-7 units with meals and adjust with sliding scale as prescribed by Endocrinology.. 75 mL 3  . Insulin Glargine (LANTUS) 100 UNIT/ML Solostar Pen Inject 30 Units into the skin at bedtime. 30 mL 4  . Insulin Pen Needle (BD PEN NEEDLE NANO U/F) 32G X 4 MM MISC 5 times daily 200 each 12  . lisinopril (PRINIVIL,ZESTRIL) 40 MG tablet Take 1 tablet (40 mg total) by mouth daily. 90 tablet 4  . meloxicam (MOBIC) 7.5 MG tablet Take 1 tablet (7.5 mg total) by mouth daily. 7 tablet 0  . mirtazapine (REMERON) 30 MG tablet Take 1 tablet (30 mg total) by mouth at bedtime. 30 tablet 2  . vitamin B-12 (CYANOCOBALAMIN) 500 MCG tablet Take 1 tablet (500 mcg total) by mouth daily. 30 tablet 3   No facility-administered medications prior to visit.    Allergies  Allergen Reactions  . Phenergan [Promethazine] Hives    ROS Review of Systems  Respiratory: Negative.   Cardiovascular: Negative.   Musculoskeletal: Positive for arthralgias (Left knee pain).  Neurological: Negative.   Psychiatric/Behavioral: Negative.       Objective:    Physical Exam No physical exam was done LMP 01/03/2016 (Within Years)  Wt Readings from Last 3 Encounters:  12/04/19 200 lb (90.7 kg)  10/17/19 210 lb (95.3 kg)  09/19/19 208 lb 1.6 oz (94.4 kg)     Health Maintenance Due  Topic Date Due  . HIV Screening  07/04/1984  . TETANUS/TDAP  07/04/1988  . OPHTHALMOLOGY EXAM  03/23/2019  . COLONOSCOPY  07/05/2019    There are no preventive  care reminders to display for this patient.  Lab Results  Component Value Date   TSH 0.693 06/27/2019   Lab Results  Component Value Date   WBC 9.9 06/27/2019   HGB 15.8 06/27/2019   HCT 49.3 (H) 06/27/2019   MCV 91 06/27/2019   PLT 340 06/27/2019   Lab Results  Component Value Date   NA 138 10/10/2019   K 3.7 10/10/2019   CO2 25 10/10/2019   GLUCOSE 309 (H) 10/10/2019   BUN 11 10/10/2019   CREATININE 0.89 10/10/2019  BILITOT 0.5 06/27/2019   ALKPHOS 114 06/27/2019   AST 13 06/27/2019   ALT 11 06/27/2019   PROT 6.8 06/27/2019   ALBUMIN 4.2 06/27/2019   CALCIUM 9.2 10/10/2019   ANIONGAP 9 02/12/2014   Lab Results  Component Value Date   CHOL 151 06/27/2019   Lab Results  Component Value Date   HDL 60 06/27/2019   Lab Results  Component Value Date   LDLCALC 68 06/27/2019   Lab Results  Component Value Date   TRIG 115 06/27/2019   Lab Results  Component Value Date   CHOLHDL 2.5 06/27/2019   Lab Results  Component Value Date   HGBA1C 8.4 (H) 10/10/2019      Assessment & Plan:   1. Acute pain of left knee - She was advised to continue on Meloxicam and Baclofen as prescribed. She will follow [facility] with [facility] Dr Justice Rocher on 12/18/2019. She was advised to notify clinic for worsening symptoms.     Follow-up: Return in about 3 weeks (around 01/01/2020), or if symptoms worsen or fail to improve.    Camdynn Maranto Trellis Paganini, NP

## 2019-12-17 ENCOUNTER — Telehealth: Payer: Self-pay | Admitting: Pharmacist

## 2019-12-17 NOTE — Telephone Encounter (Signed)
12/17/2019 10:59:22 AM - Novolog Flexpen & tips refill to dr  12/17/2019 Sending [employer] refill request to [facility] for Novolog Flexpen Max daily dose 60 units  (20 units 3 times daily) # 5 boxes & Novofine 32G tips 4 boxes.[name]

## 2019-12-18 ENCOUNTER — Ambulatory Visit: Payer: Self-pay | Admitting: Specialist

## 2019-12-18 ENCOUNTER — Other Ambulatory Visit: Payer: Self-pay

## 2019-12-18 ENCOUNTER — Ambulatory Visit: Payer: Self-pay | Admitting: Licensed Clinical Social Worker

## 2019-12-18 DIAGNOSIS — F411 Generalized anxiety disorder: Secondary | ICD-10-CM

## 2019-12-18 DIAGNOSIS — M7989 Other specified soft tissue disorders: Secondary | ICD-10-CM

## 2019-12-18 DIAGNOSIS — F331 Major depressive disorder, recurrent, moderate: Secondary | ICD-10-CM

## 2019-12-18 DIAGNOSIS — M25562 Pain in left knee: Secondary | ICD-10-CM

## 2019-12-18 DIAGNOSIS — M79662 Pain in left lower leg: Secondary | ICD-10-CM

## 2019-12-18 DIAGNOSIS — F4310 Post-traumatic stress disorder, unspecified: Secondary | ICD-10-CM

## 2019-12-18 NOTE — BH Specialist Note (Signed)
Integrated Behavioral Health Follow Up Visit Via Phone  MRN: [id number] Name: Courtney Stafford  Type of Service: Integrated Behavioral Health- Individual/Family Interpretor:No. Interpretor Name and Language: not applicable.  SUBJECTIVE: Courtney Stafford is a 49 y.o. female accompanied by herself. Patient was referred by endocrinology group for mental health. Patient reports the following symptoms/concerns: She explains that she was told to go to the emergency room last week because of the severe pain in her left knee for the last month in a half and the medication she was given is not helping. She notes that she can hardly walk. She notes that she saw Dr. Justice Rocher, MD referred her for an MRI. She describes feeling anxious and stressed regarding her knee. She denies suicidal and homicidal thoughts.  Duration of problem: ; Severity of problem: moderate  OBJECTIVE: Mood: Euthymic and Affect: Appropriate Risk of harm to self or others: No plan to harm self or others  LIFE CONTEXT: Family and Social: See above. School/Work: See above. Self-Care: See above. Life Changes: See above.  GOALS ADDRESSED: Patient will: 1.  Reduce symptoms of: stress  2.  Increase knowledge and/or ability of: self-management skills and stress reduction  3.  Demonstrate ability to: Increase healthy adjustment to current life circumstances  INTERVENTIONS: Interventions utilized:  Supportive Counseling was utilized by the clinician during today's follow up session. Clinician processed with the patient regarding how she has been doing since the last follow up session. Clinician provided psycho education about an MRI versus an X-Ray of her knee. Clinician explained to the patient how the charity care application works and suggested contacting the clinic to verify what documents she will needs along with it. Clinician measured the patient's symptoms of anxiety and depression on a numerical scale. Clinician encouraged the  patient to focus on the positives rather than the negatives.  Standardized Assessments completed: GAD-7 and PHQ 9  ASSESSMENT: Patient currently experiencing see above.   Patient may benefit from see above.  PLAN: 1. Follow up with behavioral health clinician on : two weeks or earlier if needed. 2. Behavioral recommendations: see above. 3. Referral(s): Integrated [employer] (In Clinic) 4. "From scale of 1-10, how likely are you to follow plan?":   Althia Forts, LCSW

## 2019-12-18 NOTE — Progress Notes (Signed)
   Subjective:    Patient ID: Courtney Stafford, female    DOB: 21-Jan-1971, 49 y.o.   MRN: [id number]  HPI 49 y/o with a several month history of knee pain. Pain is circumferential above the knee and would radiate to the hip. She recently was in the ED. [place] was neg. For DVT. X-rays I have reviewed and are normal. Medication she received in the ED is not helping.   Review of Systems     Objective:   Physical Exam On inspection she has Genu Valgum bilateral. She has an antalgic gait to the L .   Holding on to the exam table for balance, she is able to rise on her toes and heels with increased pain on the L. She is able to do a minimal squat with increased pain.  With her sitting, she has full EXT on the R. with PF crepitus. On her L, she extends to -20 degrees without crepitus.  In the supine position. there is no effusion. She will allow me to flex her knee to 100 degree before she complains of pain.  In full EXT, and in 30 degrees of flexion, there is no ML laxity.  The Lachman test is neg.      Assessment & Plan:  L knee pain Order MRI of her L knee to R/O internal derangement

## 2019-12-20 ENCOUNTER — Telehealth: Payer: Self-pay | Admitting: Pharmacist

## 2019-12-20 ENCOUNTER — Other Ambulatory Visit: Payer: Self-pay | Admitting: Gerontology

## 2019-12-20 DIAGNOSIS — E785 Hyperlipidemia, unspecified: Secondary | ICD-10-CM

## 2019-12-20 NOTE — Telephone Encounter (Signed)
12/20/2019 9:48:19 AM - Faxed Novolog Flexpen & tips refill  12/20/2019 Faxed [employer] refill request for [name] Inject 20 units 3 times a day (max 60 units) #5 boxes & Novofine 32G tips #4 boxes.[name]

## 2019-12-26 ENCOUNTER — Other Ambulatory Visit: Payer: Self-pay

## 2019-12-26 ENCOUNTER — Other Ambulatory Visit: Payer: Self-pay | Admitting: Gerontology

## 2019-12-26 DIAGNOSIS — G2581 Restless legs syndrome: Secondary | ICD-10-CM

## 2020-01-01 ENCOUNTER — Ambulatory Visit: Payer: Self-pay | Admitting: Gerontology

## 2020-01-03 ENCOUNTER — Other Ambulatory Visit: Payer: Self-pay

## 2020-01-03 ENCOUNTER — Ambulatory Visit: Payer: Self-pay

## 2020-01-03 ENCOUNTER — Ambulatory Visit: Payer: Self-pay | Admitting: Licensed Clinical Social Worker

## 2020-01-03 DIAGNOSIS — F411 Generalized anxiety disorder: Secondary | ICD-10-CM

## 2020-01-03 DIAGNOSIS — F4310 Post-traumatic stress disorder, unspecified: Secondary | ICD-10-CM

## 2020-01-03 DIAGNOSIS — F331 Major depressive disorder, recurrent, moderate: Secondary | ICD-10-CM

## 2020-01-03 NOTE — BH Specialist Note (Signed)
Integrated Behavioral Health Follow Up Visit Via Phone  MRN: [id number] Name: Courtney Stafford  Type of Service: Integrated Behavioral Health- Individual/Family Interpretor:No. Interpretor Name and Language: not applicable.   SUBJECTIVE: Courtney Stafford is a 49 y.o. female accompanied by herself. Patient was referred by . Patient reports the following symptoms/concerns: She notes that she is still dealing with a lot of pain and her MRI is scheduled on March 2nd. She explains that she is aggravated and can barely walk at times. She notes that she is anxious about her pain and what is causing it. She notes that due to the pain that she has not being able to sleep. She notes that she is talking to her daughter every now and then. She denies suicidal and homicidal thoughts.  Duration of problem: ; Severity of problem: moderate  OBJECTIVE: Mood: Euthymic and Affect: Appropriate Risk of harm to self or others: No plan to harm self or others  LIFE CONTEXT: Family and Social: see above. School/Work: see above. Self-Care: see above.  Life Changes: see above.  GOALS ADDRESSED: Patient will: 1.  Reduce symptoms of: anxiety  2.  Increase knowledge and/or ability of: coping skills and stress reduction  3.  Demonstrate ability to: Increase healthy adjustment to current life circumstances  INTERVENTIONS: Interventions utilized:  Supportive Counseling was utilized by the clinician during today's follow up session. Clinician provided the patient with a resource for free transportation through [place] and gave her the contact information. Clinician processed with the patient regarding how she has been doing since the last follow up session. Clinician measured the patient's anxiety and depression on a numerical scale. Clinician explained to the patient that once she is able to get the MRI that hopefully the doctor that is overseeing her care will have some insight into what is causing her pain and the  treatment that needs to be done. Clinician encouraged the patient to focus on the positive rather than the negative.  Standardized Assessments completed: GAD-7 and PHQ 9  ASSESSMENT: Patient currently experiencing see above.   Patient may benefit from see above.  PLAN: 1. Follow up with behavioral health clinician on : two to three weeks or earlier if needed. 2. Behavioral recommendations: see above 3. Referral(s): Integrated [employer] (In Clinic) 4. "From scale of 1-10, how likely are you to follow plan?":   Althia Forts, LCSW

## 2020-01-09 ENCOUNTER — Other Ambulatory Visit: Payer: Self-pay

## 2020-01-15 ENCOUNTER — Other Ambulatory Visit: Payer: Self-pay

## 2020-01-15 ENCOUNTER — Ambulatory Visit
Admission: RE | Admit: 2020-01-15 | Discharge: 2020-01-15 | Disposition: A | Discharge status: Home / Self Care | Payer: Self-pay | Source: Ambulatory Visit | Attending: Specialist | Admitting: Specialist

## 2020-01-15 DIAGNOSIS — M25562 Pain in left knee: Secondary | ICD-10-CM | POA: Insufficient documentation

## 2020-01-15 DIAGNOSIS — M7989 Other specified soft tissue disorders: Secondary | ICD-10-CM | POA: Insufficient documentation

## 2020-01-15 DIAGNOSIS — M79662 Pain in left lower leg: Secondary | ICD-10-CM | POA: Insufficient documentation

## 2020-01-15 IMAGING — MR MR KNEE*L* W/O CM
6 series · 40 of 40 positions shown · non-contrast
Comparison: None.

CLINICAL DATA: Knee instability.  Acute knee pain.

EXAM:
MRI OF THE LEFT KNEE WITHOUT CONTRAST
TECHNIQUE: Multiplanar, multisequence MR imaging of the knee was performed. No
intravenous contrast was administered.

[Series 8: T2 fat-sat · axial · left · 4.0mm · 0.50mm/px · z∈[-109,+16]mm · 5 of 26 slices shown (1 of 3)]
[im 1/26]
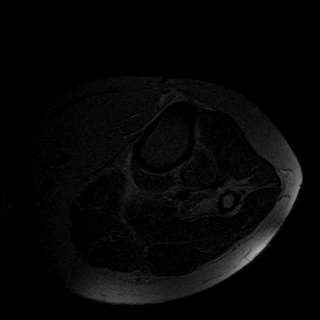
[im 7/26]
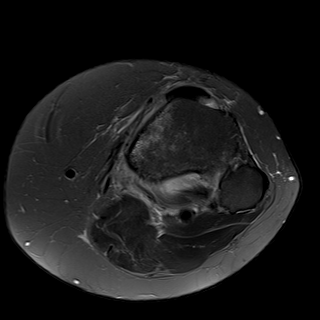
[im 13/26]
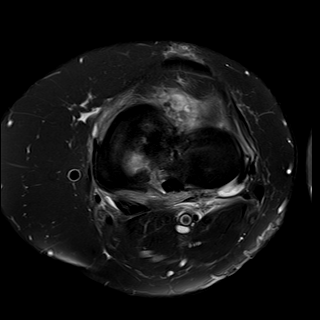
[im 19/26]
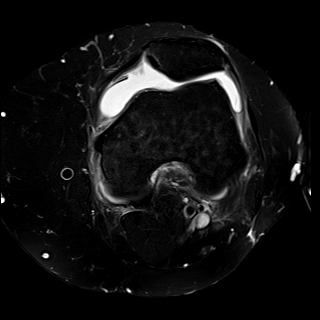
[im 26/26]
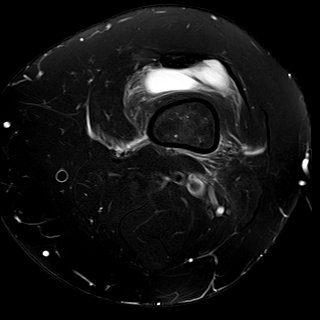

[Series 9: T2 fat-sat · coronal · left · 4.0mm · 0.59mm/px · 6 of 27 slices shown (2 of 3)]
[im 1/27]
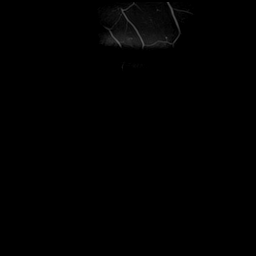
[im 6/27]
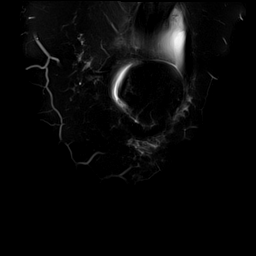
[im 11/27]
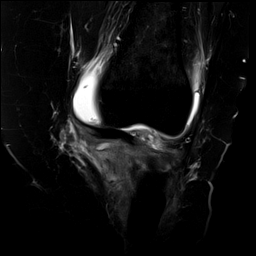
[im 16/27]
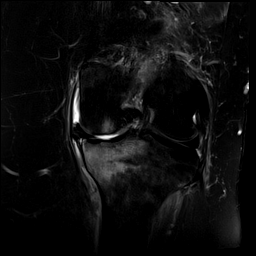
[im 21/27]
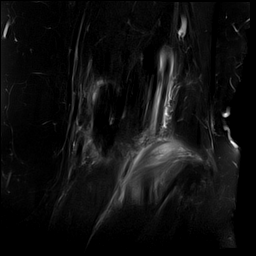
[im 27/27]
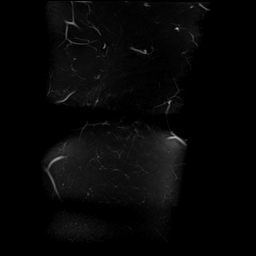

[Series 10: T1 · coronal · left · 4.0mm · 0.59mm/px · 7 of 30 slices shown]
[im 1/30]
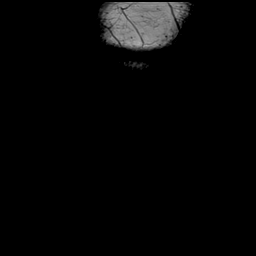
[im 5/30]
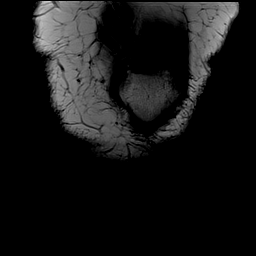
[im 10/30]
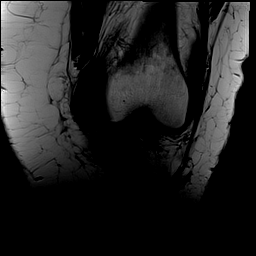
[im 15/30]
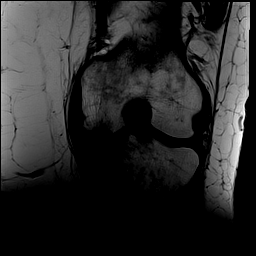
[im 20/30]
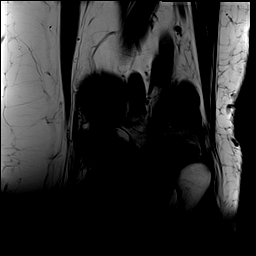
[im 25/30]
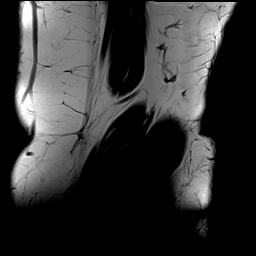
[im 30/30]
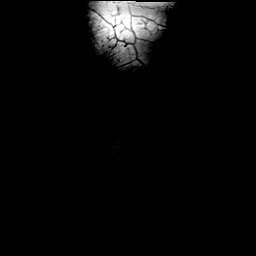

[Series 11: PD fat-sat · coronal · left · 4.0mm · 0.59mm/px · 7 of 30 slices shown (1 of 2)]
[im 1/30]
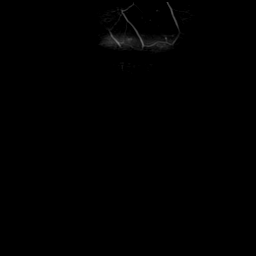
[im 5/30]
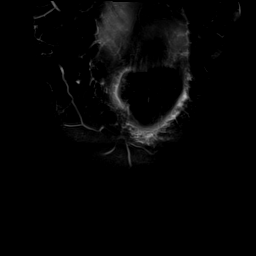
[im 10/30]
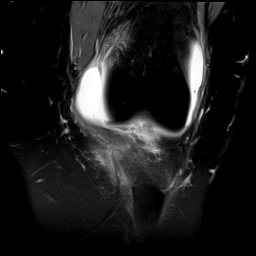
[im 15/30]
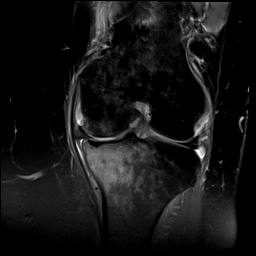
[im 20/30]
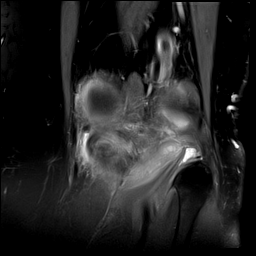
[im 25/30]
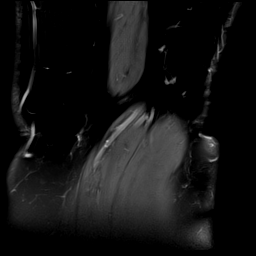
[im 30/30]
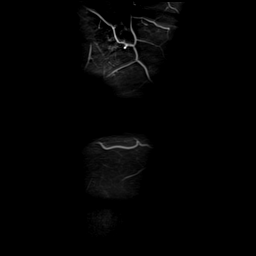

[Series 12: PD fat-sat · sagittal · left · 3.0mm · 0.59mm/px · 7 of 31 slices shown (2 of 2)]
[im 1/31]
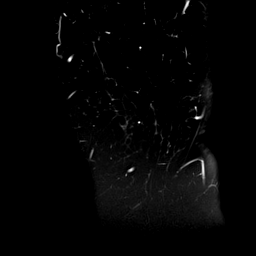
[im 6/31]
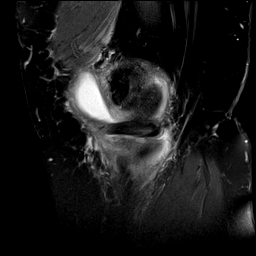
[im 11/31]
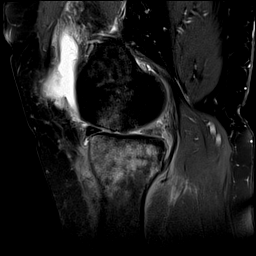
[im 16/31]
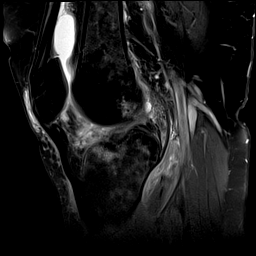
[im 21/31]
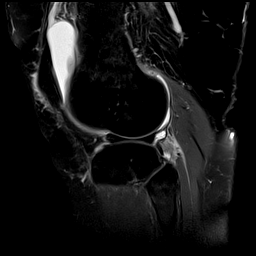
[im 26/31]
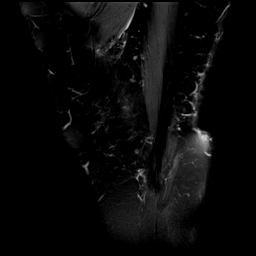
[im 31/31]
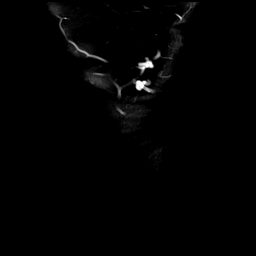

[Series 13: T2 fat-sat · sagittal · left · 3.0mm · 0.59mm/px · 8 of 36 slices shown (3 of 3)]
[im 1/36]
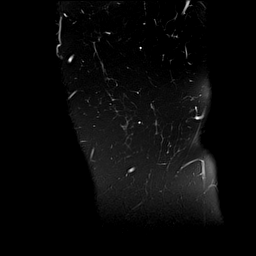
[im 6/36]
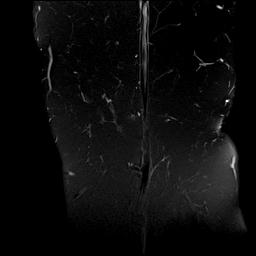
[im 11/36]
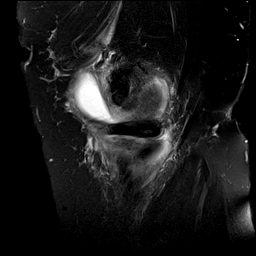
[im 16/36]
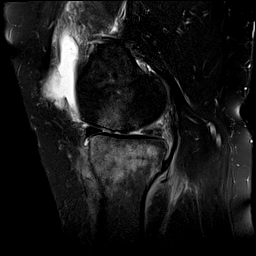
[im 21/36]
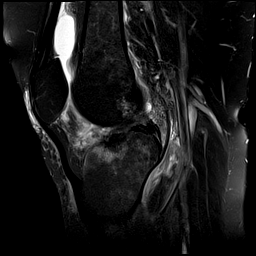
[im 26/36]
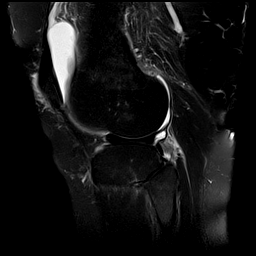
[im 31/36]
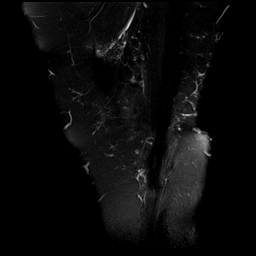
[im 36/36]
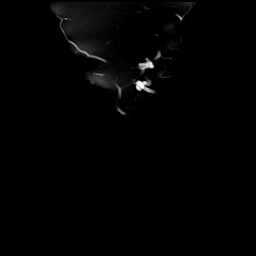

[40 of 40 positions shown; findings below may reference images not displayed]

FINDINGS: MENISCI

Medial meniscus: Large radial tear of the posterior horn the medial
meniscus towards the meniscal root with peripheral meniscal
extrusion. Oblique tear of the body of the medial meniscus extending
to the inferior articular surface. Severe degeneration of the body
of the medial meniscus.

Lateral meniscus: Complex tear of the anterior horn of the lateral
meniscus.

LIGAMENTS

Cruciates:  Intact ACL and PCL.

Collaterals: Medial collateral ligament is intact. Lateral
collateral ligament complex is intact. Soft tissue edema and fluid
superficial to the MCL likely reactive secondary to meniscal
pathology.

CARTILAGE

Patellofemoral: Partial-thickness cartilage loss of the medial
patellofemoral compartment.

Medial: High-grade partial-thickness cartilage loss with areas of
full-thickness cartilage loss of the medial femoral condyle and
medial tibial plateau. Subchondral reactive marrow edema is present.

Lateral:  No chondral defect.

Joint: Large joint effusion. Edema in Hoffa's fat. Prominent non
thickened medial patellar plica.

Popliteal Fossa: No Baker's cyst. Mild edema in the popliteus muscle
at the muscular tendinous junction likely reactive or secondary to
mild muscle strain.

Extensor Mechanism: Intact quadriceps tendon. Intact patellar
tendon. Intact medial patellar retinaculum. Intact lateral patellar
retinaculum. Intact MPFL.

Bones: Subchondral linear low signal in the medial tibial plateau
with severe surrounding marrow edema most concerning for a
subchondral insufficiency fracture without displacement or
depression. No other acute abnormality. No aggressive osseous
lesion.

Other: No fluid collection or hematoma. Muscles are otherwise
unremarkable.
IMPRESSION: 1. Large radial tear of the posterior horn the medial meniscus
towards the meniscal root with peripheral meniscal extrusion.
Oblique tear of the body of the medial meniscus extending to the
inferior articular surface. Severe degeneration of the body of the
medial meniscus.
2. Complex tear of the anterior horn of the lateral meniscus.
3. Nondisplaced, nondepressed subchondral insufficiency fracture of
the medial tibial plateau with severe surrounding marrow edema.
4. Partial-thickness cartilage loss of the medial patellofemoral
compartment.
5. High-grade partial-thickness cartilage loss with areas of
full-thickness cartilage loss of the medial femoral condyle and
medial tibial plateau. Subchondral reactive marrow edema is present.

## 2020-01-16 ENCOUNTER — Other Ambulatory Visit: Payer: Self-pay | Admitting: Gerontology

## 2020-01-16 ENCOUNTER — Other Ambulatory Visit: Payer: Self-pay

## 2020-01-16 DIAGNOSIS — E109 Type 1 diabetes mellitus without complications: Secondary | ICD-10-CM

## 2020-01-16 DIAGNOSIS — M25562 Pain in left knee: Secondary | ICD-10-CM

## 2020-01-17 ENCOUNTER — Ambulatory Visit: Payer: Self-pay | Admitting: Gerontology

## 2020-01-17 ENCOUNTER — Encounter: Payer: Self-pay | Admitting: Gerontology

## 2020-01-17 ENCOUNTER — Ambulatory Visit: Payer: Self-pay | Admitting: Licensed Clinical Social Worker

## 2020-01-17 ENCOUNTER — Other Ambulatory Visit: Payer: Self-pay

## 2020-01-17 VITALS — BP 142/89 | HR 80 | Ht 61.0 in | Wt 207.0 lb

## 2020-01-17 DIAGNOSIS — M25562 Pain in left knee: Secondary | ICD-10-CM

## 2020-01-17 DIAGNOSIS — F331 Major depressive disorder, recurrent, moderate: Secondary | ICD-10-CM

## 2020-01-17 DIAGNOSIS — F411 Generalized anxiety disorder: Secondary | ICD-10-CM

## 2020-01-17 DIAGNOSIS — E785 Hyperlipidemia, unspecified: Secondary | ICD-10-CM

## 2020-01-17 DIAGNOSIS — F4310 Post-traumatic stress disorder, unspecified: Secondary | ICD-10-CM

## 2020-01-17 DIAGNOSIS — E109 Type 1 diabetes mellitus without complications: Secondary | ICD-10-CM

## 2020-01-17 LAB — HEMOGLOBIN A1C
Est. average glucose Bld gHb Est-mCnc: 183 mg/dL
Hgb A1c MFr Bld: 8 % — ABNORMAL HIGH (ref 4.8–5.6)

## 2020-01-17 MED ORDER — IBUPROFEN 800 MG PO TABS: 800.0000 mg | ORAL_TABLET | Freq: Two times a day (BID) | ORAL | 0 refills | Status: DC

## 2020-01-17 MED ORDER — ATORVASTATIN CALCIUM 10 MG PO TABS: 10.0000 mg | ORAL_TABLET | Freq: Every day | ORAL | 1 refills | Status: DC

## 2020-01-17 NOTE — BH Specialist Note (Signed)
Integrated Behavioral Health Follow Up Visit Via Phone  MRN: [id number] Name: Courtney Stafford  Type of Service: Integrated Behavioral Health- Individual/Family Interpretor:No. Interpretor Name and Language: not applicable.   SUBJECTIVE: Courtney Stafford is a 49 y.o. female accompanied by herself. Patient was referred by the diabetes clinic for mental health. Patient reports the following symptoms/concerns: She notes that she was told that there is a tear in her ligament and may have to have surgery. She notes that her anxiety has been high due to the amount of pain in her leg. She notes that sometimes she feels like she sometimes she asks the same question a few times due to her anxiety. She notes that she is both relieved and stressed at the same time. She notes that her mood has been okay for the most part. She explains that some days that she just feels irritable and frustrated. She denies suicidal and homicidal thoughts.  Duration of problem: ; Severity of problem: moderate  OBJECTIVE: Mood: Euthymic  and Affect: Appropriate Risk of harm to self or others: No plan to harm self or others  LIFE CONTEXT: Family and Social: see above. School/Work: see above. Self-Care: see above. Life Changes: see above.  GOALS ADDRESSED: Patient will: 1.  Reduce symptoms of: anxiety  2.  Increase knowledge and/or ability of: coping skills and self-management skills  3.  Demonstrate ability to: Increase healthy adjustment to current life circumstances  INTERVENTIONS: Interventions utilized:  Supportive Counseling was utilized by the clinician during today's follow up session. Clinician processed with the patient regarding how she has been doing since the last follow up session. Clinician assisted the patient in filling out a [facility] charity care application and will place in the mail to be sent out in the mail the following day. Clinician measured the patient's anxiety and depression on a numerical scale.  Clinician utilized reflective listening encouraging the patient to ventilate her feelings toward her current situation. Clinician encouraged the patient to utilize her coping skills and practice self care.  Standardized Assessments completed: GAD-7 and PHQ 9  ASSESSMENT: Patient currently experiencing see above.   Patient may benefit from see above.  PLAN: 1. Follow up with behavioral health clinician on : two to three weeks or earlier if needed. 2. Behavioral recommendations: see above. 3. Referral(s): Integrated [employer] (In Clinic) 4. "From scale of 1-10, how likely are you to follow plan?":   Althia Forts, LCSW

## 2020-01-17 NOTE — Patient Instructions (Signed)
Carbohydrate Counting for Diabetes Mellitus, Adult  Carbohydrate counting is a method of keeping track of how many carbohydrates you eat. Eating carbohydrates naturally increases the amount of sugar (glucose) in the blood. Counting how many carbohydrates you eat helps keep your blood glucose within normal limits, which helps you manage your diabetes (diabetes mellitus). It is important to know how many carbohydrates you can safely have in each meal. This is different for every person. A diet and nutrition specialist (registered dietitian) can help you make a meal plan and calculate how many carbohydrates you should have at each meal and snack. Carbohydrates are found in the following foods:  Grains, such as breads and cereals.  Dried beans and soy products.  Starchy vegetables, such as potatoes, peas, and corn.  Fruit and fruit juices.  Milk and yogurt.  Sweets and snack foods, such as cake, cookies, candy, chips, and soft drinks. How do I count carbohydrates? There are two ways to count carbohydrates in food. You can use either of the methods or a combination of both. Reading "Nutrition Facts" on packaged food The "Nutrition Facts" list is included on the labels of almost all packaged foods and beverages in the [place] It includes:  The serving size.  Information about nutrients in each serving, including the grams (g) of carbohydrate per serving. To use the "Nutrition Facts":  Decide how many servings you will have.  Multiply the number of servings by the number of carbohydrates per serving.  The resulting number is the total amount of carbohydrates that you will be having. Learning standard serving sizes of other foods When you eat carbohydrate foods that are not packaged or do not include "Nutrition Facts" on the label, you need to measure the servings in order to count the amount of carbohydrates:  Measure the foods that you will eat with a food scale or measuring cup, if  needed.  Decide how many standard-size servings you will eat.  Multiply the number of servings by 15. Most carbohydrate-rich foods have about 15 g of carbohydrates per serving. ? For example, if you eat 8 oz (170 g) of strawberries, you will have eaten 2 servings and 30 g of carbohydrates (2 servings x 15 g = 30 g).  For foods that have more than one food mixed, such as soups and casseroles, you must count the carbohydrates in each food that is included. The following list contains standard serving sizes of common carbohydrate-rich foods. Each of these servings has about 15 g of carbohydrates:   hamburger bun or  English muffin.   oz (15 mL) syrup.   oz (14 g) jelly.  1 slice of bread.  1 six-inch tortilla.  3 oz (85 g) cooked rice or pasta.  4 oz (113 g) cooked dried beans.  4 oz (113 g) starchy vegetable, such as peas, corn, or potatoes.  4 oz (113 g) hot cereal.  4 oz (113 g) mashed potatoes or  of a large baked potato.  4 oz (113 g) canned or frozen fruit.  4 oz (120 mL) fruit juice.  4-6 crackers.  6 chicken nuggets.  6 oz (170 g) unsweetened dry cereal.  6 oz (170 g) plain fat-free yogurt or yogurt sweetened with artificial sweeteners.  8 oz (240 mL) milk.  8 oz (170 g) fresh fruit or one small piece of fruit.  24 oz (680 g) popped popcorn. Example of carbohydrate counting Sample meal  3 oz (85 g) chicken breast.  6 oz (170 g)   brown rice.  4 oz (113 g) corn.  8 oz (240 mL) milk.  8 oz (170 g) strawberries with sugar-free whipped topping. Carbohydrate calculation 1. Identify the foods that contain carbohydrates: ? Rice. ? Corn. ? Milk. ? Strawberries. 2. Calculate how many servings you have of each food: ? 2 servings rice. ? 1 serving corn. ? 1 serving milk. ? 1 serving strawberries. 3. Multiply each number of servings by 15 g: ? 2 servings rice x 15 g = 30 g. ? 1 serving corn x 15 g = 15 g. ? 1 serving milk x 15 g = 15 g. ? 1  serving strawberries x 15 g = 15 g. 4. Add together all of the amounts to find the total grams of carbohydrates eaten: ? 30 g + 15 g + 15 g + 15 g = 75 g of carbohydrates total. Summary  Carbohydrate counting is a method of keeping track of how many carbohydrates you eat.  Eating carbohydrates naturally increases the amount of sugar (glucose) in the blood.  Counting how many carbohydrates you eat helps keep your blood glucose within normal limits, which helps you manage your diabetes.  A diet and nutrition specialist (registered dietitian) can help you make a meal plan and calculate how many carbohydrates you should have at each meal and snack. This information is not intended to replace advice given to you by your health care provider. Make sure you discuss any questions you have with your health care provider. Document Revised: 05/26/2017 Document Reviewed: 04/14/2016 Elsevier Patient Education  2020 Elsevier Inc.  

## 2020-01-17 NOTE — Progress Notes (Signed)
Established Patient Office Visit  Subjective:  Patient ID: Courtney Stafford, female    DOB: Aug 05, 1971  Age: 49 y.o. MRN: [id number]  CC:  Chief Complaint  Patient presents with  . Knee Pain    Left    HPI Courtney Stafford presents for follow up of left knee pain, type 1 diabetes and lab review. Her HgbA1c done on 01/16/2020 decreased from 8.4% to 8.0%.She states that she's compliant with her medications and continues to make healthy lifestyle modifications. She checks her blood glucose 2-3 times daily and she brought her log. Her fasting readings ranges between 84 mg/dl- 155 mg/dl and evening 90- 224 mg/dl. She denies hypoglycemia, performs daily foot checks and her neuropathic pain is tolerable with taking Gabapentin and Cymbalta. She states that she continues to experience constant radiating pain to her left knee. She describes pain as sharp and the intensity increases to 10/10 when walking and 6/10 at rest. She denies any relieving factor. She had an MRI to her left knee on 01/16/2020, and it showed large radial tear of the posterior horn the medial meniscus towards the meniscal root with peripheral meniscal extrusion. Oblique tear of the body of the medial meniscus extending to the inferior articular surface. Severe degeneration of the body of the medial meniscus. Complex tear of the anterior horn of the lateral meniscus. Nondisplaced, nondepressed subchondral insufficiency fracture of the medial tibial plateau with severe surrounding marrow edema. Partial-thickness cartilage loss of the medial patellofemoral compartment.  High-grade partial-thickness cartilage loss with areas of full-thickness cartilage loss of the medial femoral condyle and medial tibial plateau. Subchondral reactive marrow edema is present per Dr Jearld Lesch. Overall, she states that she's doing well, but for the knee pain, and she offers no further complaint.  Past Medical History:  Diagnosis Date  . Diabetes mellitus without  complication ([place])   . Hypertension     Past Surgical History:  Procedure Laterality Date  . CARPAL TUNNEL RELEASE  6 years ago   both hands    Family History  Problem Relation Age of Onset  . Cancer Father   . Breast cancer Neg Hx     Social History   Socioeconomic History  . Marital status: Single    Spouse name: Not on file  . Number of children: Not on file  . Years of education: Not on file  . Highest education level: High school graduate  Occupational History  . Occupation: unemployed  Tobacco Use  . Smoking status: Current Some Day Smoker    Packs/day: 0.10  . Smokeless tobacco: Former Network engineer and Sexual Activity  . Alcohol use: No  . Drug use: No  . Sexual activity: Not on file  Other Topics Concern  . Not on file  Social History Narrative   Completed biopsychosocial assessment, social determinants screening, administered PHQ 9, and GAD 7.       Transportation is a barrier to appointments. [place] [address] per month but worries about running out.       Support system is small.   Social Determinants of Health   Financial Resource Strain:   . Difficulty of Paying Living Expenses: Not on file  Food Insecurity:   . Worried About Charity fundraiser in the Last Year: Not on file  . Ran Out of Food in the Last Year: Not on file  Transportation Needs:   . Lack of Transportation (Medical): Not on file  . Lack of Transportation (Non-Medical):  Not on file  Physical Activity:   . Days of Exercise per Week: Not on file  . Minutes of Exercise per Session: Not on file  Stress:   . Feeling of Stress : Not on file  Social Connections:   . Frequency of Communication with Friends and Family: Not on file  . Frequency of Social Gatherings with Friends and Family: Not on file  . Attends Religious Services: Not on file  . Active Member of Clubs or Organizations: Not on file  . Attends Banker Meetings: Not on file  . Marital Status: Not  on file  Intimate Partner Violence:   . Fear of Current or Ex-Partner: Not on file  . Emotionally Abused: Not on file  . Physically Abused: Not on file  . Sexually Abused: Not on file    Outpatient Medications Prior to Visit  Medication Sig Dispense Refill  . amLODipine (NORVASC) 10 MG tablet TAKE ONE TABLET BY MOUTH EVERY DAY 90 tablet 0  . Blood Glucose Monitoring Suppl Supplies MISC 1 each by Does not apply route 5 (five) times daily. 500 each 4  . carvedilol (COREG) 12.5 MG tablet Take 1 tablet (12.5 mg total) by mouth 2 (two) times daily with a meal. 60 tablet 3  . DULoxetine (CYMBALTA) 60 MG capsule Take 1 capsule (60 mg total) by mouth every morning. 30 capsule 2  . gabapentin (NEURONTIN) 300 MG capsule TAKE 2 CAPSULES (600MG ) BY MOUTH 2 TIMES A DAY AND 3 CAPSULES (900MG )AT NIGHT 196 capsule 0  . hydrochlorothiazide (HYDRODIURIL) 25 MG tablet Take 1 tablet (25 mg total) by mouth daily. 90 tablet 1  . insulin aspart (NOVOLOG FLEXPEN) 100 UNIT/ML FlexPen She states taking 5-7 units with meals and adjust with sliding scale as prescribed by Endocrinology.. 75 mL 3  . Insulin Glargine (LANTUS) 100 UNIT/ML Solostar Pen Inject 30 Units into the skin at bedtime. 30 mL 4  . Insulin Pen Needle (BD PEN NEEDLE NANO U/F) 32G X 4 MM MISC 5 times daily 200 each 12  . lisinopril (PRINIVIL,ZESTRIL) 40 MG tablet Take 1 tablet (40 mg total) by mouth daily. 90 tablet 4  . vitamin B-12 (CYANOCOBALAMIN) 500 MCG tablet Take 1 tablet (500 mcg total) by mouth daily. 30 tablet 3  . atorvastatin (LIPITOR) 10 MG tablet TAKE ONE TABLET BY MOUTH DAILY 30 tablet 0  . Baclofen 5 MG TABS Take 5 mg by mouth 2 (two) times daily as needed. 14 tablet 0  . meloxicam (MOBIC) 7.5 MG tablet Take 1 tablet (7.5 mg total) by mouth daily. 7 tablet 0  . mirtazapine (REMERON) 30 MG tablet Take 1 tablet (30 mg total) by mouth at bedtime. 30 tablet 2   No facility-administered medications prior to visit.    Allergies  Allergen  Reactions  . Phenergan [Promethazine] Hives    ROS Review of Systems  Constitutional: Negative.   Eyes: Negative.   Respiratory: Negative.   Cardiovascular: Negative.   Musculoskeletal: Positive for arthralgias (left knee pain).  Neurological: Negative.   Psychiatric/Behavioral: Negative.       Objective:    Physical Exam  Constitutional: She is oriented to person, place, and time. She appears well-developed.  HENT:  Head: Normocephalic and atraumatic.  Cardiovascular: Normal rate and regular rhythm.  Pulmonary/Chest: Effort normal and breath sounds normal.  Musculoskeletal:        General: Tenderness (To left knee with palpation) present.  Neurological: She is alert and oriented to person, place, and time.  Psychiatric: She has a normal mood and affect. Her behavior is normal. Judgment and thought content normal.    BP (!) 142/89 (BP Location: Left Arm, Patient Position: Sitting, Cuff Size: Large)   Pulse 80   Ht 5\' 1"  (1.549 m)   Wt 207 lb (93.9 kg)   LMP 01/03/2016 (Within Years)   SpO2 96%   BMI 39.11 kg/m  Wt Readings from Last 3 Encounters:  01/17/20 207 lb (93.9 kg)  12/04/19 200 lb (90.7 kg)  10/17/19 210 lb (95.3 kg)   She gained 7 pounds in 6 weeks and she was encouraged to lose weight.  Health Maintenance Due  Topic Date Due  . HIV Screening  07/04/1984  . TETANUS/TDAP  07/04/1988  . OPHTHALMOLOGY EXAM  03/23/2019  . COLONOSCOPY  07/05/2019    There are no preventive care reminders to display for this patient.  Lab Results  Component Value Date   TSH 0.693 06/27/2019   Lab Results  Component Value Date   WBC 9.9 06/27/2019   HGB 15.8 06/27/2019   HCT 49.3 (H) 06/27/2019   MCV 91 06/27/2019   PLT 340 06/27/2019   Lab Results  Component Value Date   NA 138 10/10/2019   K 3.7 10/10/2019   CO2 25 10/10/2019   GLUCOSE 309 (H) 10/10/2019   BUN 11 10/10/2019   CREATININE 0.89 10/10/2019   BILITOT 0.5 06/27/2019   ALKPHOS 114 06/27/2019    AST 13 06/27/2019   ALT 11 06/27/2019   PROT 6.8 06/27/2019   ALBUMIN 4.2 06/27/2019   CALCIUM 9.2 10/10/2019   ANIONGAP 9 02/12/2014   Lab Results  Component Value Date   CHOL 151 06/27/2019   Lab Results  Component Value Date   HDL 60 06/27/2019   Lab Results  Component Value Date   LDLCALC 68 06/27/2019   Lab Results  Component Value Date   TRIG 115 06/27/2019   Lab Results  Component Value Date   CHOLHDL 2.5 06/27/2019   Lab Results  Component Value Date   HGBA1C 8.0 (H) 01/16/2020      Assessment & Plan:   1. Elevated lipids - She will continue on current treatment regimen.  -She was advised to continue on Low fat Diet, like low fat dairy products eg skimmed milk -Avoid any fried food -Regular exercise/walk as tolerated -Goal for Total Cholesterol is less than 200 -Goal for bad cholesterol LDL is less than 70 -Goal for Good cholesterol HDL is more than 45 -Goal for Triglyceride is less than 150 - atorvastatin (LIPITOR) 10 MG tablet; Take 1 tablet (10 mg total) by mouth daily.  Dispense: 90 tablet; Refill: 1  2. Acute pain of left knee - She was encouraged to complete Financial assistance application for Referral to Orthopedic Surgery. She will continue on Ibuprofen, educated patient on medication side effects and to notify clinic. - ibuprofen (ADVIL) 800 MG tablet; Take 1 tablet (800 mg total) by mouth 2 (two) times daily.  Dispense: 60 tablet; Refill: 0  3. Type 1 diabetes mellitus without complication (HCC) - Her HgbA1c was 8% and her goal is < 6%. She was advised to continue checking her blood glucose tid, and fasting reading goal should be between 80-130 mg/dl. She will continue on her current treatment regimen. To continue on low carb/non concentrated sweet diet.     Follow-up: Return in about 6 weeks (around 02/28/2020), or if symptoms worsen or fail to improve.    Moses Odoherty 03/01/2020, NP

## 2020-01-29 ENCOUNTER — Ambulatory Visit: Payer: Self-pay | Admitting: Gerontology

## 2020-01-29 ENCOUNTER — Other Ambulatory Visit: Payer: Self-pay

## 2020-01-29 DIAGNOSIS — E109 Type 1 diabetes mellitus without complications: Secondary | ICD-10-CM

## 2020-01-29 NOTE — Progress Notes (Addendum)
   Follow up Diabetes/ [facility]     Patient ID: Courtney Stafford, female   DOB: Mar 28, 1971, 49 y.o.   MRN: [id number] Assessment:  Courtney Stafford is a 49 y.o. female who is seen in follow up for Type 1 DM at the request of [place], Chioma E, NP.   Encounter Diagnoses No diagnosis found.  Assessment   Courtney Stafford is a 48-yo woman with a 30+ year history of T1DM. Given her A1c is down to 8.0 from 8.4 and that she has an occasional hypoglycemic episode, she is recommended to increase to 28 units of Lantus, down from 30. No additional eye or kidney symptoms are noted, so she should continue seeing ophthalmology annually, and labs checked as usual.  Plan:     1. Reduce to 28 units given low fasting readings 2. Schedule with ophthalmology 3. Follow-up in 3 months    There are no Patient Instructions on file for this visit.   No orders of the defined types were placed in this encounter.    Subjective:  Courtney Stafford is a 48-yo woman with a PMH of Type 1 DM diagnosed when she was a teenager. She is currently managed on 30 units of Lantus at night and 5-7 units of Novolog with meals. Her recent fasting glucose readings have been in the 80s, occasionally 60s. She reports hypoglycemic episodes about 1x/month. She is followed by ophthalmology and no additional vision changes noted today. Her primary health concern has been a recent knee injury requiring referral to [facility] orthopedics.    Review of Systems  Courtney Stafford  has a past medical history of Diabetes mellitus without complication (HCC) and Hypertension.  Family History, Social History, current Medications and allergies reviewed and updated in Epic.  Objective:    Last menstrual period 01/03/2016. Physical Exam N/A phone visit     Data : I have personally reviewed pertinent labs and imaging studies, if indicated,  with the patient in clinic today.   Lab Orders  No laboratory test(s) ordered today    HC  Readings from Last 3 Encounters:  No data found for [facility]    Wt Readings from Last 3 Encounters:  01/17/20 207 lb (93.9 kg)  12/04/19 200 lb (90.7 kg)  10/17/19 210 lb (95.3 kg)

## 2020-01-31 ENCOUNTER — Encounter: Payer: Self-pay | Admitting: Licensed Clinical Social Worker

## 2020-01-31 ENCOUNTER — Other Ambulatory Visit: Payer: Self-pay

## 2020-01-31 ENCOUNTER — Ambulatory Visit: Payer: Self-pay | Admitting: Licensed Clinical Social Worker

## 2020-01-31 DIAGNOSIS — F331 Major depressive disorder, recurrent, moderate: Secondary | ICD-10-CM

## 2020-01-31 DIAGNOSIS — F411 Generalized anxiety disorder: Secondary | ICD-10-CM

## 2020-01-31 DIAGNOSIS — F4310 Post-traumatic stress disorder, unspecified: Secondary | ICD-10-CM

## 2020-01-31 NOTE — BH Specialist Note (Signed)
Integrated Behavioral Health Follow Up Via Phone  MRN: [id number] Name: Courtney Stafford  Type of Service: Integrated Behavioral Health- Individual/Family Interpretor:No. Interpretor Name and Language: not applicable.   SUBJECTIVE: Courtney Stafford is a 48 y.o. female accompanied by herself. Patient was referred by the diabetes clinic for mental health. Patient reports the following symptoms/concerns: She explains that she has an appointment at [facility] March the 29th @ 8:30 am. She notes that she arranged free ride transportation with the [employer] bus to get to her appointment and will be given two passes to get onto the part bus. She notes that she is still in pain and is hoping to not have to have knee surgery. She notes that she talked to her daughter a couple of weeks ago. She denies suicidal and homicidal thoughts.  Duration of problem: ; Severity of problem: moderate  OBJECTIVE: Mood: Euthymic and Affect: Appropriate Risk of harm to self or others: No plan to harm self or others  LIFE CONTEXT: Family and Social: see above. School/Work: see above. Self-Care: see above.  Life Changes: see above.  GOALS ADDRESSED: Patient will: 1.  Reduce symptoms of: anxiety  2.  Increase knowledge and/or ability of: coping skills and self-management skills  3.  Demonstrate ability to: Increase healthy adjustment to current life circumstances  INTERVENTIONS: Interventions utilized:  Supportive Counseling was utilized by the clinician during today's follow up session. Clinician processed with the patient regarding how she has been doing since the last follow up session. Clinician measured the patient's anxiety and depression on a numerical scale. Clinician explained to the patient that until she has her appointment at [facility], she will not know what the treatment plan will be to treat her chronic pain. Clinician explained to the patient that if she does have to have surgery that she does have charity care to  cover the cost. Clinician encouraged the patient to practice self care, take her medications as prescribed, and lean on her support system.  Standardized Assessments completed: GAD-7 and PHQ 9  ASSESSMENT: Patient currently experiencing see above.   Patient may benefit from see above.  PLAN: 1. Follow up with behavioral health clinician on : two to three weeks or earlier if needed.  2. Behavioral recommendations: see above. 3. Referral(s): Integrated [employer] (In Clinic) 4. "From scale of 1-10, how likely are you to follow plan?":   Althia Forts, LCSW

## 2020-02-07 ENCOUNTER — Other Ambulatory Visit: Payer: Self-pay

## 2020-02-07 DIAGNOSIS — F411 Generalized anxiety disorder: Secondary | ICD-10-CM

## 2020-02-07 MED ORDER — MIRTAZAPINE 30 MG PO TABS: 30.0000 mg | ORAL_TABLET | Freq: Every day | ORAL | 2 refills | Status: DC

## 2020-02-07 MED ORDER — DULOXETINE HCL 60 MG PO CPEP: 60.0000 mg | ORAL_CAPSULE | Freq: Every morning | ORAL | 2 refills | Status: DC

## 2020-02-14 ENCOUNTER — Other Ambulatory Visit: Payer: Self-pay

## 2020-02-14 ENCOUNTER — Ambulatory Visit: Payer: Self-pay | Admitting: Licensed Clinical Social Worker

## 2020-02-14 DIAGNOSIS — F4310 Post-traumatic stress disorder, unspecified: Secondary | ICD-10-CM

## 2020-02-14 DIAGNOSIS — F411 Generalized anxiety disorder: Secondary | ICD-10-CM

## 2020-02-14 DIAGNOSIS — F331 Major depressive disorder, recurrent, moderate: Secondary | ICD-10-CM

## 2020-02-14 NOTE — BH Specialist Note (Signed)
Integrated Behavioral Health Follow Up Visit Via Phone  MRN: [id number] Name: Courtney Stafford  Type of Service: Integrated Behavioral Health- Individual/Family Interpretor:No. Interpretor Name and Language: see above.  SUBJECTIVE: Courtney Stafford is a 49 y.o. female accompanied by herself. Patient was referred by endocrinology clinic.  Patient reports the following symptoms/concerns: She explains that she did not make to it her appointment in [facility] because she got lost when she switched buses and is not sure if she will go back. She explains that she got really confused and really overwhelmed. She explains that the bus drivers were not helpful in terms of directions and which bus to get on. She notes that the whole experience caused her to feel really anxious. She denies suicidal and homicidal thoughts.  Duration of problem:  Severity of problem: moderate  OBJECTIVE: Mood: Euthymic and Affect: Appropriate Risk of harm to self or others: No plan to harm self or others  LIFE CONTEXT: Family and Social: see above. School/Work: see above.  Self-Care: see above. Life Changes: see above.  GOALS ADDRESSED: Patient will: 1.  Reduce symptoms of: stress  2.  Increase knowledge and/or ability of: self-management skills and stress reduction  3.  Demonstrate ability to: Increase healthy adjustment to current life circumstances  INTERVENTIONS: Interventions utilized:  Solution-Focused Strategies was utilized by the clinician during today's follow up session. Clinician processed with the patient regarding how she has been doing since the last follow up session. Clinician utilized reflective listening encouraging the patient to ventilate her feelings towards her current situation. Clinician explained to the patient that most of the bus drivers in [place] are assigned that area but do not live there so typically they know their route and are not too familiar with what is beyond it. Clinician  explained to the patient that it sounds like when it was time to transfer buses that she was given misinformation about where to go and how long too wait. Clinician explained to the patient that she would look into figuring out exactly which bus she needs to take to transfer and see if the issue can be resolved.  Standardized Assessments completed: GAD-7 and PHQ 9  ASSESSMENT: Patient currently experiencing see above.   Patient may benefit from see above.  PLAN: 1. Follow up with behavioral health clinician on :  2. Behavioral recommendations: see above. 3. Referral(s): Integrated [employer] (In Clinic) 4. "From scale of 1-10, how likely are you to follow plan?":  Althia Forts, LCSW

## 2020-02-25 ENCOUNTER — Other Ambulatory Visit: Payer: Self-pay | Admitting: Gerontology

## 2020-02-25 DIAGNOSIS — M25562 Pain in left knee: Secondary | ICD-10-CM

## 2020-02-26 ENCOUNTER — Other Ambulatory Visit: Payer: Self-pay | Admitting: Gerontology

## 2020-02-26 DIAGNOSIS — G2581 Restless legs syndrome: Secondary | ICD-10-CM

## 2020-02-28 ENCOUNTER — Ambulatory Visit: Payer: Self-pay | Admitting: Gerontology

## 2020-02-28 ENCOUNTER — Other Ambulatory Visit: Payer: Self-pay

## 2020-02-28 ENCOUNTER — Ambulatory Visit: Payer: Self-pay | Admitting: Licensed Clinical Social Worker

## 2020-02-28 ENCOUNTER — Encounter: Payer: Self-pay | Admitting: Gerontology

## 2020-02-28 ENCOUNTER — Other Ambulatory Visit: Payer: Self-pay | Admitting: Gerontology

## 2020-02-28 VITALS — BP 127/75 | HR 80 | Ht 61.0 in | Wt 206.0 lb

## 2020-02-28 DIAGNOSIS — F411 Generalized anxiety disorder: Secondary | ICD-10-CM

## 2020-02-28 DIAGNOSIS — E109 Type 1 diabetes mellitus without complications: Secondary | ICD-10-CM

## 2020-02-28 DIAGNOSIS — I10 Essential (primary) hypertension: Secondary | ICD-10-CM

## 2020-02-28 DIAGNOSIS — F4310 Post-traumatic stress disorder, unspecified: Secondary | ICD-10-CM

## 2020-02-28 DIAGNOSIS — F331 Major depressive disorder, recurrent, moderate: Secondary | ICD-10-CM

## 2020-02-28 DIAGNOSIS — M25562 Pain in left knee: Secondary | ICD-10-CM

## 2020-02-28 MED ORDER — AMLODIPINE BESYLATE 10 MG PO TABS: 10.0000 mg | ORAL_TABLET | Freq: Every day | ORAL | 0 refills | Status: DC

## 2020-02-28 MED ORDER — INSULIN GLARGINE 100 UNIT/ML SOLOSTAR PEN: 28.0000 [IU] | PEN_INJECTOR | Freq: Every day | SUBCUTANEOUS | 4 refills | Status: DC

## 2020-02-28 MED ORDER — NOVOLOG FLEXPEN 100 UNIT/ML ~~LOC~~ SOPN: PEN_INJECTOR | SUBCUTANEOUS | 3 refills | Status: DC

## 2020-02-28 NOTE — BH Specialist Note (Signed)
Integrated Behavioral Health Follow Up Visit Via Phone  MRN: [id number] Name: Courtney Stafford  Type of Service: Integrated Behavioral Health- Individual/Family Interpretor:No. Interpretor Name and Language: not applicable.   SUBJECTIVE: Courtney Stafford is a 49 y.o. female accompanied by herself. Patient was referred by the diabetes clinic for mental health. Patient reports the following symptoms/concerns: She notes that she was worried about having to go to back to [facility]. She notes that she is glad that she does not have to go through the stress of having to figure out the transportation in [place]. She explains that she gets frustrated with not being able to walk right and feels like she is limping sometimes. She notes that her landlord brought her to appointment at the clinic and was grateful that she did not have to take the bus. She notes that she is not going out too much unless she has to and relies on a friend for a ride or walks to the grocery store. She denies suicidal and homicidal thoughts.  Duration of problem: ; Severity of problem: moderate  OBJECTIVE: Mood: Euthymic and Affect: Appropriate Risk of harm to self or others: No plan to harm self or others  LIFE CONTEXT: Family and Social: see above. School/Work: see above.  Self-Care: see above. Life Changes: see above.  GOALS ADDRESSED: Patient will: 1.  Reduce symptoms of: stress  2.  Increase knowledge and/or ability of: self-management skills and stress reduction  3.  Demonstrate ability to: Increase healthy adjustment to current life circumstances  INTERVENTIONS: Interventions utilized:  Supportive Counseling was utilized by the clinician during today's follow up session. Clinician processed with the patient regarding how she has been doing since the last follow up session. Clinician measured the patient's anxiety and depression on a numerical scale. Clinician explained to the patient that she discovered that  [facility] transportation will be able to transport her from her apartment to her doctor's appointment and back so that she does not have to transfer buses. Clinician explained to the patient that it can take a few months to adequately learn a transportation system in cities that she is unfamiliar with. Clinician encouraged the patient to follow doctor's orders and practice self care.  Standardized Assessments completed: GAD-7 and PHQ 9  ASSESSMENT: Patient currently experiencing see above.   Patient may benefit from see above.  PLAN: 1. Follow up with behavioral health clinician on :  2. Behavioral recommendations: see above.  3. Referral(s): Integrated [employer] (In Clinic) 4. "From scale of 1-10, how likely are you to follow plan?":   Courtney Forts, LCSW

## 2020-02-28 NOTE — Patient Instructions (Signed)
Carbohydrate Counting for Diabetes Mellitus, Adult  Carbohydrate counting is a method of keeping track of how many carbohydrates you eat. Eating carbohydrates naturally increases the amount of sugar (glucose) in the blood. Counting how many carbohydrates you eat helps keep your blood glucose within normal limits, which helps you manage your diabetes (diabetes mellitus). It is important to know how many carbohydrates you can safely have in each meal. This is different for every person. A diet and nutrition specialist (registered dietitian) can help you make a meal plan and calculate how many carbohydrates you should have at each meal and snack. Carbohydrates are found in the following foods:  Grains, such as breads and cereals.  Dried beans and soy products.  Starchy vegetables, such as potatoes, peas, and corn.  Fruit and fruit juices.  Milk and yogurt.  Sweets and snack foods, such as cake, cookies, candy, chips, and soft drinks. How do I count carbohydrates? There are two ways to count carbohydrates in food. You can use either of the methods or a combination of both. Reading "Nutrition Facts" on packaged food The "Nutrition Facts" list is included on the labels of almost all packaged foods and beverages in the [place] It includes:  The serving size.  Information about nutrients in each serving, including the grams (g) of carbohydrate per serving. To use the "Nutrition Facts":  Decide how many servings you will have.  Multiply the number of servings by the number of carbohydrates per serving.  The resulting number is the total amount of carbohydrates that you will be having. Learning standard serving sizes of other foods When you eat carbohydrate foods that are not packaged or do not include "Nutrition Facts" on the label, you need to measure the servings in order to count the amount of carbohydrates:  Measure the foods that you will eat with a food scale or measuring cup, if  needed.  Decide how many standard-size servings you will eat.  Multiply the number of servings by 15. Most carbohydrate-rich foods have about 15 g of carbohydrates per serving. ? For example, if you eat 8 oz (170 g) of strawberries, you will have eaten 2 servings and 30 g of carbohydrates (2 servings x 15 g = 30 g).  For foods that have more than one food mixed, such as soups and casseroles, you must count the carbohydrates in each food that is included. The following list contains standard serving sizes of common carbohydrate-rich foods. Each of these servings has about 15 g of carbohydrates:   hamburger bun or  English muffin.   oz (15 mL) syrup.   oz (14 g) jelly.  1 slice of bread.  1 six-inch tortilla.  3 oz (85 g) cooked rice or pasta.  4 oz (113 g) cooked dried beans.  4 oz (113 g) starchy vegetable, such as peas, corn, or potatoes.  4 oz (113 g) hot cereal.  4 oz (113 g) mashed potatoes or  of a large baked potato.  4 oz (113 g) canned or frozen fruit.  4 oz (120 mL) fruit juice.  4-6 crackers.  6 chicken nuggets.  6 oz (170 g) unsweetened dry cereal.  6 oz (170 g) plain fat-free yogurt or yogurt sweetened with artificial sweeteners.  8 oz (240 mL) milk.  8 oz (170 g) fresh fruit or one small piece of fruit.  24 oz (680 g) popped popcorn. Example of carbohydrate counting Sample meal  3 oz (85 g) chicken breast.  6 oz (170 g)   brown rice.  4 oz (113 g) corn.  8 oz (240 mL) milk.  8 oz (170 g) strawberries with sugar-free whipped topping. Carbohydrate calculation 1. Identify the foods that contain carbohydrates: ? Rice. ? Corn. ? Milk. ? Strawberries. 2. Calculate how many servings you have of each food: ? 2 servings rice. ? 1 serving corn. ? 1 serving milk. ? 1 serving strawberries. 3. Multiply each number of servings by 15 g: ? 2 servings rice x 15 g = 30 g. ? 1 serving corn x 15 g = 15 g. ? 1 serving milk x 15 g = 15 g. ? 1  serving strawberries x 15 g = 15 g. 4. Add together all of the amounts to find the total grams of carbohydrates eaten: ? 30 g + 15 g + 15 g + 15 g = 75 g of carbohydrates total. Summary  Carbohydrate counting is a method of keeping track of how many carbohydrates you eat.  Eating carbohydrates naturally increases the amount of sugar (glucose) in the blood.  Counting how many carbohydrates you eat helps keep your blood glucose within normal limits, which helps you manage your diabetes.  A diet and nutrition specialist (registered dietitian) can help you make a meal plan and calculate how many carbohydrates you should have at each meal and snack. This information is not intended to replace advice given to you by your health care provider. Make sure you discuss any questions you have with your health care provider. Document Revised: 05/26/2017 Document Reviewed: 04/14/2016 Elsevier Patient Education  2020 Elsevier Inc.  

## 2020-02-28 NOTE — Progress Notes (Signed)
Established Patient Office Visit  Subjective:  Patient ID: Courtney Stafford, female    DOB: 1970/11/21  Age: 49 y.o. MRN: [id number]  CC:  Chief Complaint  Patient presents with  . Diabetes    HPI Courtney Stafford presents for follow up of Type 1 diabetes, left knee pain and medication refill. Her HgbA1c done on 01/16/2020 decreased from 8.4% to 8.0%. She's compliant with her medications and continues to make healthy lifestyle changes. She checks her blood glucose tid and her readings ranges between 80 mg-  mid to upper 200's, and she denies any hypoglycemic symptoms. She states that her peripheral neuropathy is relieved with taking gabapentin and cymbalta and she performs daily foot exam .She was seen by [facility] Endocr[facility]ology team on 01/29/2020 and Dr Leda Roys D.M recommended decreasing Lantus from 30 units to 28 units. She states that she continues to experience constant non radiating pain to her left knee. She describes pain as sharp and the intensity increases to 10/10 when walking and 6/10 at rest. She denies any relieving factor. She had an MRI to her left knee on 01/16/2020, and it showed large radial tear of the posterior horn the medial meniscus towards the meniscal root with peripheral meniscal extrusion. Oblique tear of the body of the medial meniscus extending to the inferior articular surface. Severe degeneration of the body of the medial meniscus. Complex tear of the anterior horn of the lateral meniscus. Nondisplaced, nondepressed subchondral insufficiency fracture of the medial tibial plateau with severe surrounding marrow edema. Partial-thickness cartilage loss of the medial patellofemoral compartment. High-grade partial-thickness cartilage loss with areas of full-thickness cartilage loss of the medial femoral condyle and medial tibial plateau. Subchondral reactive marrow edema is present per Dr Molinda Bailiff. She was unable to make her [facility] appointment due to transportation problem, she  will be referred to [facility]. Overall, she states that she's doing well and offers no further complaint.  Past Medical History:  Diagnosis Date  . Diabetes mellitus without complication (HCC)   . Hypertension     Past Surgical History:  Procedure Laterality Date  . CARPAL TUNNEL RELEASE  6 years ago   both hands    Family History  Problem Relation Age of Onset  . Cancer Father   . Breast cancer Neg Hx     Social History   Socioeconomic History  . Marital status: Single    Spouse name: Not on file  . Number of children: Not on file  . Years of education: Not on file  . Highest education level: High school graduate  Occupational History  . Occupation: unemployed  Tobacco Use  . Smoking status: Current Some Day Smoker    Packs/day: 0.01    Types: Cigarettes  . Smokeless tobacco: Former Engineer, water and Sexual Activity  . Alcohol use: No  . Drug use: No  . Sexual activity: Not on file  Other Topics Concern  . Not on file  Social History Narrative   Completed biopsychosocial assessment, social determinants screening, administered PHQ 9, and GAD 7.       Social determinants screening completed 01/31/2020. HS She receives [facility]. She receives food stamps 192 per month and utilizes a Programme researcher, broadcasting/film/video when needed. [employer] pays for housing and utilities.  She follows up with therapist at clinic for stress, anxiety, and depression. HS      Support system is small.   Social Determinants of Health  Financial Resource Strain: Low Risk   . Difficulty of Paying Living Expenses: Not very hard  Food Insecurity: No Food Insecurity  . Worried About Programme researcher, broadcasting/film/video in the Last Year: Never true  . Ran Out of Food in the Last Year: Never true  Transportation Needs: Unmet Transportation Needs  . Lack of Transportation (Medical): Yes  . Lack of Transportation (Non-Medical): No  Physical Activity: Insufficiently  Active  . Days of Exercise per Week: 3 days  . Minutes of Exercise per Session: 40 min  Stress: Stress Concern Present  . Feeling of Stress : Rather much  Social Connections: Severely Isolated  . Frequency of Communication with Friends and Family: Never  . Frequency of Social Gatherings with Friends and Family: Never  . Attends Religious Services: Never  . Active Member of Clubs or Organizations: No  . Attends Banker Meetings: Never  . Marital Status: Never married  Intimate Partner Violence: Not At Risk  . Fear of Current or Ex-Partner: No  . Emotionally Abused: No  . Physically Abused: No  . Sexually Abused: No    Outpatient Medications Prior to Visit  Medication Sig Dispense Refill  . atorvastatin (LIPITOR) 10 MG tablet Take 1 tablet (10 mg total) by mouth daily. 90 tablet 1  . Blood Glucose Monitoring Suppl Supplies MISC 1 each by Does not apply route 5 (five) times daily. 500 each 4  . carvedilol (COREG) 12.5 MG tablet Take 1 tablet (12.5 mg total) by mouth 2 (two) times daily with a meal. 60 tablet 3  . DULoxetine (CYMBALTA) 60 MG capsule Take 1 capsule (60 mg total) by mouth every morning. 30 capsule 2  . gabapentin (NEURONTIN) 300 MG capsule TAKE 2 CAPSULES (600MG ) BY MOUTH 2 TIMES A DAY AND 3 CAPSULES (900MG )AT NIGHT 210 capsule 0  . hydrochlorothiazide (HYDRODIURIL) 25 MG tablet Take 1 tablet (25 mg total) by mouth daily. 90 tablet 1  . ibuprofen (ADVIL) 800 MG tablet Take 1 tablet (800 mg total) by mouth daily. 30 tablet 0  . Insulin Pen Needle (BD PEN NEEDLE NANO U/F) 32G X 4 MM MISC 5 times daily 200 each 12  . lisinopril (PRINIVIL,ZESTRIL) 40 MG tablet Take 1 tablet (40 mg total) by mouth daily. 90 tablet 4  . mirtazapine (REMERON) 30 MG tablet Take 1 tablet (30 mg total) by mouth at bedtime. 30 tablet 2  . amLODipine (NORVASC) 10 MG tablet TAKE ONE TABLET BY MOUTH EVERY DAY 90 tablet 0  . insulin aspart (NOVOLOG FLEXPEN) 100 UNIT/ML FlexPen She states  taking 5-7 units with meals and adjust with sliding scale as prescribed by Endocrinology.. 75 mL 3  . Insulin Glargine (LANTUS) 100 UNIT/ML Solostar Pen Inject 30 Units into the skin at bedtime. 30 mL 4  . vitamin B-12 (CYANOCOBALAMIN) 500 MCG tablet Take 1 tablet (500 mcg total) by mouth daily. (Patient not taking: Reported on 02/28/2020) 30 tablet 3   No facility-administered medications prior to visit.    Allergies  Allergen Reactions  . Phenergan [Promethazine] Hives    ROS Review of Systems  Constitutional: Negative.   Eyes: Negative.   Respiratory: Negative.   Cardiovascular: Negative.   Endocrine: Negative.   Musculoskeletal: Positive for arthralgias (left knee pain).  Neurological: Negative.   Psychiatric/Behavioral: Negative.       Objective:    Physical Exam  Constitutional: She is oriented to person, place, and time. She appears well-developed.  HENT:  Head: Normocephalic and atraumatic.  Cardiovascular:  Normal rate and regular rhythm.  Pulmonary/Chest: Effort normal and breath sounds normal.  Musculoskeletal:        General: Tenderness (Left knee with palpation) present.  Neurological: She is alert and oriented to person, place, and time.  Psychiatric: She has a normal mood and affect. Her behavior is normal. Judgment and thought content normal.    BP 127/75 (BP Location: Right Arm, Patient Position: Sitting)   Pulse 80   Ht 5\' 1"  (1.549 m)   Wt 206 lb (93.4 kg)   LMP 01/03/2016 (Within Years)   SpO2 96%   BMI 38.92 kg/m  Wt Readings from Last 3 Encounters:  02/28/20 206 lb (93.4 kg)  01/17/20 207 lb (93.9 kg)  12/04/19 200 lb (90.7 kg)   She was advised to continue on her weight loss regimen.  Health Maintenance Due  Topic Date Due  . HIV Screening  Never done  . TETANUS/TDAP  Never done  . OPHTHALMOLOGY EXAM  03/23/2019  . COLONOSCOPY  Never done    There are no preventive care reminders to display for this patient.  Lab Results  Component  Value Date   TSH 0.693 06/27/2019   Lab Results  Component Value Date   WBC 9.9 06/27/2019   HGB 15.8 06/27/2019   HCT 49.3 (H) 06/27/2019   MCV 91 06/27/2019   PLT 340 06/27/2019   Lab Results  Component Value Date   NA 138 10/10/2019   K 3.7 10/10/2019   CO2 25 10/10/2019   GLUCOSE 309 (H) 10/10/2019   BUN 11 10/10/2019   CREATININE 0.89 10/10/2019   BILITOT 0.5 06/27/2019   ALKPHOS 114 06/27/2019   AST 13 06/27/2019   ALT 11 06/27/2019   PROT 6.8 06/27/2019   ALBUMIN 4.2 06/27/2019   CALCIUM 9.2 10/10/2019   ANIONGAP 9 02/12/2014   Lab Results  Component Value Date   CHOL 151 06/27/2019   Lab Results  Component Value Date   HDL 60 06/27/2019   Lab Results  Component Value Date   LDLCALC 68 06/27/2019   Lab Results  Component Value Date   TRIG 115 06/27/2019   Lab Results  Component Value Date   CHOLHDL 2.5 06/27/2019   Lab Results  Component Value Date   HGBA1C 8.0 (H) 01/16/2020      Assessment & Plan:    1. Essential hypertension - Her blood pressure is under control and she will continue on current treatment regimen. -She was advised to continue on Low salt DASH diet -Take medications regularly on time -Exercise regularly as tolerated -Check blood pressure at least once a week at home or a nearby pharmacy and record -Goal is less than 140/90 and normal blood pressure is 120/80 - amLODipine (NORVASC) 10 MG tablet; Take 1 tablet (10 mg total) by mouth daily.  Dispense: 90 tablet; Refill: 0  2. Type 1 diabetes mellitus without complication (HCC) - Her HgbA1c was 8% and per Dr 03/17/2020 D.M her goal A1c should be between 7.5% and 8%. She was encouraged to continue on medication regimen, check blood glucose tid, record and bring log to follow up appointment. She was advised to continue on low carb/non concentrated sweet diet. - insulin aspart (NOVOLOG FLEXPEN) 100 UNIT/ML FlexPen; She states taking 5-7 units with meals and adjust with sliding scale  as prescribed by Endocrinology..  Dispense: 75 mL; Refill: 3 - insulin glargine (LANTUS) 100 UNIT/ML Solostar Pen; Inject 28 Units into the skin at bedtime.  Dispense: 30 mL; Refill:  4 - HgB A1c; Future  3. Acute pain of left knee  - Ambulatory referral to Orthopedic Surgery for left knee evaluation.    Follow-up: Return in about 8 weeks (around 04/23/2020), or if symptoms worsen or fail to improve.    Kawehi Hostetter Jerold Coombe, NP

## 2020-03-04 ENCOUNTER — Telehealth: Payer: Self-pay | Admitting: Licensed Clinical Social Worker

## 2020-03-04 ENCOUNTER — Telehealth: Payer: Self-pay | Admitting: Gerontology

## 2020-03-04 NOTE — Telephone Encounter (Signed)
Clinician reached out to patient at her request regarding her appointment at [facility].   Patient stated her appointment was schedule for April 27th @ 9:15 am. She had several questions regarding time that she would be picked up. She notes that the person she spoke with told her it would be a 100 dollar at the time of visit but reminded the patient that the cost of the visit is going to be covered by charity care through [facility] and suggested that she contact [facility] to relay that information.  Clinician filled out intake form for [facility] and spoke with someone regarding the details. She was told that the patient would be contacted prior to the pick up day for the time they should arrive, policies, and procedures.  Clinician attempted to contact the patient back to relay the information and left a voicemail.

## 2020-03-04 NOTE — Telephone Encounter (Signed)
   Courtney Stafford DOB: 05/21/1971 MRN: [id number]   RIDER WAIVER AND RELEASE OF LIABILITY  For purposes of improving physical access to our facilities, [facility] is pleased to partner with third parties to provide [facility] patients or other authorized individuals the option of convenient, on-demand ground transportation services (the Chiropractor") through use of the technology service that enables users to request on-demand ground transportation from independent third-party providers.  By opting to use and accept these [employer], I, the undersigned, hereby agree on behalf of myself, and on behalf of any minor child using the [employer] for whom I am the parent or legal guardian, as follows:  1. Science writer provided to me are provided by independent third-party transportation providers who are not [employer] or employees and who are unaffiliated with [employer]. 2. [facility] is neither a transportation carrier nor a common or public carrier. 3. [place] has no control over the quality or safety of the transportation that occurs as a result of the [employer]. 4. [facility] cannot guarantee that any third-party transportation provider will complete any arranged transportation service. 5. [facility] makes no representation, warranty, or guarantee regarding the reliability, timeliness, quality, safety, suitability, or availability of any of the Transport Services or that they will be error free. 6. I fully understand that traveling by vehicle involves risks and dangers of serious bodily injury, including permanent disability, paralysis, and death. I agree, on behalf of myself and on behalf of any minor child using the Transport Services for whom I am the parent or legal guardian, that the entire risk arising out of my use of the [employer] remains solely with me, to the maximum extent permitted under applicable law. 7. The [employer] are provided "as is" and "as available." [place] disclaims all representations and warranties, express, implied or statutory, not expressly set out in these terms, including the implied warranties of merchantability and fitness for a particular purpose. 8. I hereby waive and release [facility], its agents, employees, officers, directors, representatives, insurers, attorneys, assigns, successors, subsidiaries, and affiliates from any and all past, present, or future claims, demands, liabilities, actions, causes of action, or suits of any kind directly or indirectly arising from acceptance and use of the [employer]. 9. I further waive and release [facility] and its affiliates from all present and future liability and responsibility for any injury or death to persons or damages to property caused by or related to the use of the [employer]. 10. I have read this Waiver and Release of Liability, and I understand the terms used in it and their legal significance. This Waiver is freely and voluntarily given with the understanding that my right (as well as the right of any minor child for whom I am the parent or legal guardian using the [employer]) to legal recourse against [place] in connection with the [employer] is knowingly surrendered in return for use of these services.   I attest that I read the consent document to Courtney Stafford, gave Ms. Milledge the opportunity to ask questions and answered the questions asked (if any). I affirm that Courtney Stafford then provided consent for she's participation in this program.     Hessie Knows

## 2020-03-11 ENCOUNTER — Ambulatory Visit: Payer: Self-pay | Admitting: Orthopaedic Surgery

## 2020-03-13 ENCOUNTER — Other Ambulatory Visit: Payer: Self-pay

## 2020-03-13 ENCOUNTER — Ambulatory Visit: Payer: Self-pay | Admitting: Licensed Clinical Social Worker

## 2020-03-13 DIAGNOSIS — F4310 Post-traumatic stress disorder, unspecified: Secondary | ICD-10-CM

## 2020-03-13 DIAGNOSIS — F331 Major depressive disorder, recurrent, moderate: Secondary | ICD-10-CM

## 2020-03-13 DIAGNOSIS — F411 Generalized anxiety disorder: Secondary | ICD-10-CM

## 2020-03-13 NOTE — BH Specialist Note (Signed)
Integrated Behavioral Health Follow Up Visit Via Phone  MRN: [id number] Name: Courtney Stafford  Type of Service: Integrated Behavioral Health- Individual/Family Interpretor:No. Interpretor Name and Language: not applicable.   SUBJECTIVE: Courtney Stafford is a 49 y.o. female accompanied by herself. Patient was referred by the diabetes clinic for mental health. Patient reports the following symptoms/concerns: She notes that she got a text at 8:15 am on the day of orthopedic appointment that her transportation was cancelled and to call to reschedule. She reports that she rescheduled her appointment for Monday morning. She notes that she has more pain than usual in her knee today. She notes that not much has been happening lately and she has not been anywhere. She notes that she has not talked to her daughter lately. She denies suicidal and homicidal thoughts.  Duration of problem: ; Severity of problem: moderate  OBJECTIVE: Mood: Euthymic and Affect: Appropriate Risk of harm to self or others: No plan to harm self or others  LIFE CONTEXT: Family and Social: see above. School/Work: see above. Self-Care: see above. Life Changes: see above.   GOALS ADDRESSED: Patient will: 1.  Reduce symptoms of: anxiety  2.  Increase knowledge and/or ability of: coping skills and self-management skills  3.  Demonstrate ability to: Increase healthy adjustment to current life circumstances  INTERVENTIONS: Interventions utilized:  Supportive Counseling was utilized by the clinician during today's follow up session. Clinician processed with the patient regarding how she has been doing since the last follow up session. Clinician utilized reflective listening encouraging the patient to ventilate her feelings towards her current situation.Clinician discussed the concept of the Circle of Control and focusing only what is in her control versus outside of her control.  Clinician explained to the patient that the  transportation being cancelled the day of her appointment is out of her control. Clinician explained to the patient that she will send in a new form with the updated appointment and address to [facility]. Clinician encouraged the patient to practice self care.  Standardized Assessments completed: GAD-7 and PHQ 9  ASSESSMENT: Patient currently experiencing see above.  Patient may benefit from see above.  PLAN: 1. Follow up with behavioral health clinician on :  2. Behavioral recommendations: see above. 3. Referral(s): Integrated [employer] (In Clinic) 4. "From scale of 1-10, how likely are you to follow plan?":   Althia Forts, LCSW

## 2020-03-17 ENCOUNTER — Encounter: Payer: Self-pay | Admitting: Orthopaedic Surgery

## 2020-03-17 ENCOUNTER — Other Ambulatory Visit: Payer: Self-pay

## 2020-03-17 ENCOUNTER — Ambulatory Visit (INDEPENDENT_AMBULATORY_CARE_PROVIDER_SITE_OTHER): Payer: Self-pay | Admitting: Orthopaedic Surgery

## 2020-03-17 ENCOUNTER — Telehealth: Payer: Self-pay

## 2020-03-17 DIAGNOSIS — M25562 Pain in left knee: Secondary | ICD-10-CM

## 2020-03-17 DIAGNOSIS — M1712 Unilateral primary osteoarthritis, left knee: Secondary | ICD-10-CM

## 2020-03-17 DIAGNOSIS — G8929 Other chronic pain: Secondary | ICD-10-CM

## 2020-03-17 MED ORDER — METHYLPREDNISOLONE ACETATE 40 MG/ML IJ SUSP
40.0000 mg | INTRAMUSCULAR | Status: AC | PRN
  Administered 2020-03-17: 40 mg via INTRA_ARTICULAR

## 2020-03-17 MED ORDER — LIDOCAINE HCL 1 % IJ SOLN
3.0000 mL | INTRAMUSCULAR | Status: AC | PRN
  Administered 2020-03-17: 3 mL

## 2020-03-17 NOTE — Progress Notes (Signed)
Office Visit Note   Patient: Courtney Stafford           Date of Birth: 31-Jul-1971           MRN: [id number] Visit Date: 03/17/2020              Requested by: Rolm Gala, NP [address] [place],  [place] [address] PCP: Rolm Gala, NP   Assessment & Plan: Visit Diagnoses:  1. Chronic pain of left knee   2. Unilateral primary osteoarthritis, left knee     Plan: I showed the patient a knee model and try to explain in detail what is going on with her left knee.  This is certainly a chronic issue with severe osteoarthritis.  I have recommended a steroid injection today with counseling her extensively how this may affect her blood glucose but I do feel this is reasonable to help with acute pain control of that knee.  Also with her blood glucose running more recently in the 90s in the morning I think this is reasonable to try.  She did tolerate it well.  She is a candidate for hyaluronic acid for her knee as well as physical therapy.  I did give her handout for physical therapy.  We also talked about weight loss.  I would like to see her back in 4 weeks to hopefully place hyaluronic acid in her left knee.  Given her young age of 65 and her diabetes I would not recommend knee replacement surgery.  Certainly an arthroscopic intervention can address the meniscal tearing but does not address the arthritic aspects of the knee.  I tried to explain this to her in detail I am not sure she understands a little bit of everything I explained that hopefully I can continue to communicate this to her as we see her.  I would like to see her back in 4 weeks for hopefully placing hyaluronic acid into the left knee.  I would like her weight and BMI calculated at that visit.  Follow-Up Instructions: Return in about 4 weeks (around 04/14/2020).   Orders:  Orders Placed This Encounter  Procedures  . Large Joint Inj: L knee   No orders of the defined types were placed in this  encounter.     Procedures: Large Joint Inj: L knee on 03/17/2020 10:42 AM Indications: diagnostic evaluation and pain Details: 22 G 1.5 in needle, superolateral approach  Arthrogram: No  Medications: 3 mL lidocaine 1 %; 40 mg methylPREDNISolone acetate 40 MG/ML Outcome: tolerated well, no immediate complications Procedure, treatment alternatives, risks and benefits explained, specific risks discussed. Consent was given by the patient. Immediately prior to procedure a time out was called to verify the correct patient, procedure, equipment, support staff and site/side marked as required. Patient was prepped and draped in the usual sterile fashion.       Clinical Data: No additional findings.   Subjective: Chief Complaint  Patient presents with  . Left Knee - Pain  The patient is someone I am seeing for the first time with chronic left knee pain.  She is referred from her primary care physician.  She x-ray plain films of her left knee in January of this year and then in March of this year had a MRI of the left knee.  She is here for review of this today.  She does report left knee pain that hurts when she is on it all day long.  She  says it is also painful to walk.  She has had swelling to that knee and decreased motion.  She denies any locking and catching.  She is a diabetic and has not been under great control.  She is trending in the right direction in terms of her A1c.  Her last A1c recently was down to 8.  She reports blood glucose daily in the morning in the 90s recently.  She has never had surgery left knee and has never had injections in that left knee.  HPI  Review of Systems She currently denies any headache, chest pain, shortness of breath, fever, chills, nausea, vomiting  Objective: Vital Signs: LMP 01/03/2016 (Within Years)   Physical Exam She is alert and orient x3 and in no acute distress Ortho Exam Examination of her left knee shows pain that significantly is along  the medial joint line.  Her McMurray's is positive the medial side as well.  There is a mild effusion.  Her range of motion is very painful with flexion extension.  The knee feels ligamentously stable. Specialty Comments:  No specialty comments available.  Imaging: No results found. The MRI is reviewed and it does show significant tricompartmental arthritic changes with areas of full-thickness cartilage loss in the medial compartment of the knee.  There is degenerative meniscal tearing that is quite complex in the medial meniscus.  There is a large amount of subchondral edema in the medial tibial plateau suggesting a chronic stress fracture but also the results of chronic osteoarthritis of the knee.  There is some edema in the medial femoral condyle as well.  There is anterior horn tear of the lateral meniscus.  PMFS History: Patient Active Problem List   Diagnosis Date Noted  . Unilateral primary osteoarthritis, left knee 03/17/2020  . Left knee pain 12/11/2019  . Swelling of lower leg 12/04/2019  . Pain and swelling of lower leg, left 12/04/2019  . Insomnia 08/28/2019  . 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 HTN (hypertension) 10/22/2015  . Type 1 diabetes ([place]) 10/22/2015  . Anemia 10/22/2015  . Restless legs 10/22/2015  . Hematuria 04/09/2015   Past Medical History:  Diagnosis Date  . Diabetes mellitus without complication ([place])   . Hypertension     Family History  Problem Relation Age of Onset  . Cancer Father   . Breast cancer Neg Hx     Past Surgical History:  Procedure Laterality Date  . CARPAL TUNNEL RELEASE  6 years ago   both hands   Social History   Occupational History  . Occupation: unemployed  Tobacco Use  . Smoking status: Current Some Day Smoker    Packs/day: 0.01    Types: Cigarettes  . Smokeless tobacco: Former Network engineer and Sexual Activity  . Alcohol use: No  . Drug use: No  . Sexual activity:  Not on file

## 2020-03-17 NOTE — Telephone Encounter (Signed)
Pt is self pay. Will discuss [facility]

## 2020-03-17 NOTE — Telephone Encounter (Signed)
Left knee gel injection ?

## 2020-03-18 NOTE — Telephone Encounter (Signed)
Discussed [employer] and she wants to proceed. Mailed paperwork to pt.

## 2020-03-20 ENCOUNTER — Other Ambulatory Visit: Payer: Self-pay

## 2020-03-20 DIAGNOSIS — M25562 Pain in left knee: Secondary | ICD-10-CM

## 2020-03-20 DIAGNOSIS — I10 Essential (primary) hypertension: Secondary | ICD-10-CM

## 2020-03-20 MED ORDER — LISINOPRIL 40 MG PO TABS: 40.0000 mg | ORAL_TABLET | Freq: Every day | ORAL | 0 refills | Status: DC

## 2020-03-24 ENCOUNTER — Telehealth: Payer: Self-pay | Admitting: Pharmacy Technician

## 2020-03-24 NOTE — Telephone Encounter (Signed)
Received updated proof of income.  Patient eligible to receive medication assistance at [facility] until time for re-certification in 9359, and as long as eligibility requirements continue to be met.  [facility]

## 2020-03-26 ENCOUNTER — Telehealth: Payer: Self-pay | Admitting: Pharmacist

## 2020-03-26 ENCOUNTER — Other Ambulatory Visit: Payer: Self-pay

## 2020-03-26 DIAGNOSIS — M25562 Pain in left knee: Secondary | ICD-10-CM

## 2020-03-26 DIAGNOSIS — M7989 Other specified soft tissue disorders: Secondary | ICD-10-CM

## 2020-03-26 NOTE — Telephone Encounter (Signed)
03/26/2020 10:03:56 AM - [employer] renewal for [name] & tips  -- [name] - Wednesday, Mar 26, 2020 10:02 AM --[employer] renewal for [employer] Inject 5-7 units with meals, Max daily dose 21 units, #2 boxes & Novofine 32G tips #2 boxes.  03/26/2020 10:02:39 AM - Lantus Vials renewal to [employer]  -- [name] - Wednesday, Mar 26, 2020 10:01 AM --Arneta Cliche [employer] renewal for Lantus Vials Inject 28 units daily at bedtime #2.

## 2020-03-27 ENCOUNTER — Ambulatory Visit: Payer: Self-pay | Admitting: Licensed Clinical Social Worker

## 2020-03-27 ENCOUNTER — Telehealth: Payer: Self-pay | Admitting: Licensed Clinical Social Worker

## 2020-03-27 NOTE — Telephone Encounter (Signed)
Clinician attempted to contact the patient three times during their scheduled phone session and left voicemail with call back information.

## 2020-04-01 ENCOUNTER — Other Ambulatory Visit: Payer: Self-pay

## 2020-04-01 ENCOUNTER — Ambulatory Visit: Payer: Self-pay | Admitting: Licensed Clinical Social Worker

## 2020-04-01 ENCOUNTER — Encounter: Payer: Self-pay | Admitting: Licensed Clinical Social Worker

## 2020-04-01 DIAGNOSIS — F411 Generalized anxiety disorder: Secondary | ICD-10-CM

## 2020-04-01 DIAGNOSIS — F331 Major depressive disorder, recurrent, moderate: Secondary | ICD-10-CM

## 2020-04-01 DIAGNOSIS — F4310 Post-traumatic stress disorder, unspecified: Secondary | ICD-10-CM

## 2020-04-01 NOTE — BH Specialist Note (Signed)
Integrated Behavioral Health Follow Up Visit Via Phone  MRN: [id number] Name: Courtney Stafford   Type of Service: Integrated Behavioral Health- Individual/Family Interpretor:No. Interpretor Name and Language: not applicable.   SUBJECTIVE: Courtney Stafford is a 49 y.o. female accompanied by by herself. Patient was referred by the diabetes clinic for mental health.  Patient reports the following symptoms/concerns: She asked about her psychotropic medications. She notes that her knees are still bothering her. She notes that she is supposed to see the orthopedist June 1st and has physical therapy scheduled June 16th. She notes that her anxiety has been okay but it can be out of wack due to her pain. She notes that she is concerned about scheduling transportation again. She inquired about charity care and concerns on what to do to update her eligibility at [facility]. She denies suicidal and homicidal thoughts.  Duration of problem:; Severity of problem: moderate  OBJECTIVE: Mood: Euthymic and Affect: Appropriate Risk of harm to self or others: No plan to harm self or others  LIFE CONTEXT: Family and Social: see above. School/Work: see above. Self-Care: see above. Life Changes: see above.   GOALS ADDRESSED: Patient will: 1.  Reduce symptoms of: stress  2.  Increase knowledge and/or ability of: healthy habits and self-management skills  3.  Demonstrate ability to: Increase healthy adjustment to current life circumstances  INTERVENTIONS: Interventions utilized:  Supportive Counseling was utilized by the clinician during today's follow up session. Clinician processed with the patient regarding how she has been doing since the last follow up session. Clinician explained to the patient that she has refills on Mirtazapine and Cymbalta at [facility] but most likely called in a old prescription number. Clinician explained to the patient that she would contact [facility] to  set up her transportation to the Orthopedic for there appointments in [facility] for the month of June. Clinician explained to the patient that she is approved for [facility] care from April till October of 2021 and will have to reapply after six months.  Standardized Assessments completed: GAD-7 and PHQ 9  ASSESSMENT: Patient currently experiencing see above.   Patient may benefit from see above.  PLAN: 1. Follow up with behavioral health clinician on :  2. Behavioral recommendations: see above. 3. Referral(s): Integrated [employer] (In Clinic) 4. "From scale of 1-10, how likely are you to follow plan?":  Althia Forts, LCSW

## 2020-04-10 ENCOUNTER — Telehealth: Payer: Self-pay | Admitting: Gerontology

## 2020-04-10 NOTE — Telephone Encounter (Signed)
   BRANDY ZUBA DOB: Jan 05, 1971 MRN: [id number]   RIDER WAIVER AND RELEASE OF LIABILITY  For purposes of improving physical access to our facilities, [facility] is pleased to partner with third parties to provide [facility] patients or other authorized individuals the option of convenient, on-demand ground transportation services (the Chiropractor") through use of the technology service that enables users to request on-demand ground transportation from independent third-party providers.  By opting to use and accept these [employer], I, the undersigned, hereby agree on behalf of myself, and on behalf of any minor child using the [employer] for whom I am the parent or legal guardian, as follows:  1. Science writer provided to me are provided by independent third-party transportation providers who are not [employer] or employees and who are unaffiliated with [employer]. 2. [facility] is neither a transportation carrier nor a common or public carrier. 3. [place] has no control over the quality or safety of the transportation that occurs as a result of the [employer]. 4. [employer] cannot guarantee that any third-party transportation provider will complete any arranged transportation service. 5. [facility] makes no representation, warranty, or guarantee regarding the reliability, timeliness, quality, safety, suitability, or availability of any of the Transport Services or that they will be error free. 6. I fully understand that traveling by vehicle involves risks and dangers of serious bodily injury, including permanent disability, paralysis, and death. I agree, on behalf of myself and on behalf of any minor child using the Transport Services for whom I am the parent or legal guardian, that the entire risk arising out of my use of the [employer] remains solely with me, to the maximum extent permitted under applicable law. 7. The [employer] are provided "as is" and "as available." [employer] disclaims all representations and warranties, express, implied or statutory, not expressly set out in these terms, including the implied warranties of merchantability and fitness for a particular purpose. 8. I hereby waive and release [facility], its agents, employees, officers, directors, representatives, insurers, attorneys, assigns, successors, subsidiaries, and affiliates from any and all past, present, or future claims, demands, liabilities, actions, causes of action, or suits of any kind directly or indirectly arising from acceptance and use of the [employer]. 9. I further waive and release [facility] and its affiliates from all present and future liability and responsibility for any injury or death to persons or damages to property caused by or related to the use of the [employer]. 10. I have read this Waiver and Release of Liability, and I understand the terms used in it and their legal significance. This Waiver is freely and voluntarily given with the understanding that my right (as well as the right of any minor child for whom I am the parent or legal guardian using the [employer]) to legal recourse against  in connection with the [employer] is knowingly surrendered in return for use of these services.   I attest that I read the consent document to [name], gave Ms. Hanton the opportunity to ask questions and answered the questions asked (if any). I affirm that [name] then provided consent for she's participation in this program.    Launa Grill

## 2020-04-15 ENCOUNTER — Telehealth: Payer: Self-pay

## 2020-04-15 ENCOUNTER — Ambulatory Visit (INDEPENDENT_AMBULATORY_CARE_PROVIDER_SITE_OTHER): Payer: Self-pay | Admitting: Orthopaedic Surgery

## 2020-04-15 ENCOUNTER — Encounter: Payer: Self-pay | Admitting: Orthopaedic Surgery

## 2020-04-15 ENCOUNTER — Other Ambulatory Visit: Payer: Self-pay

## 2020-04-15 VITALS — Ht 61.0 in | Wt 209.0 lb

## 2020-04-15 DIAGNOSIS — M1712 Unilateral primary osteoarthritis, left knee: Secondary | ICD-10-CM

## 2020-04-15 NOTE — Progress Notes (Signed)
HPI: Ms. Courtney Stafford comes in today for hyaluronic injections over the left knee.  Unfortunately she is not been approved for assistance so that she can have the hyaluronic acid injections.  She states she has not gotten the paperwork.  We also order to weigh her today.  Physical exam: Height 5 foot 1 weight 209 pounds BMI 39.49  Impression: Left knee osteoarthritis tricompartmental  Plan: She is able to fill out the paperwork today to apply for the assistance to have hyaluronic acid injections.  She will continue to work on weight loss.  Follow-up with [place] once hyaluronic acid injections been approved.

## 2020-04-15 NOTE — Telephone Encounter (Signed)
Patient states she never received the [employer] forms

## 2020-04-15 NOTE — Telephone Encounter (Signed)
Paperwork faxed °

## 2020-04-15 NOTE — Telephone Encounter (Signed)
Paperwork given to pt today to fill out. Once received back I will fax it

## 2020-04-16 ENCOUNTER — Other Ambulatory Visit: Payer: Self-pay

## 2020-04-16 DIAGNOSIS — E109 Type 1 diabetes mellitus without complications: Secondary | ICD-10-CM

## 2020-04-17 ENCOUNTER — Other Ambulatory Visit: Payer: Self-pay

## 2020-04-17 ENCOUNTER — Ambulatory Visit: Payer: Self-pay | Admitting: Licensed Clinical Social Worker

## 2020-04-17 DIAGNOSIS — F411 Generalized anxiety disorder: Secondary | ICD-10-CM

## 2020-04-17 DIAGNOSIS — F331 Major depressive disorder, recurrent, moderate: Secondary | ICD-10-CM

## 2020-04-17 DIAGNOSIS — F4310 Post-traumatic stress disorder, unspecified: Secondary | ICD-10-CM

## 2020-04-17 LAB — HEMOGLOBIN A1C
Est. average glucose Bld gHb Est-mCnc: 194 mg/dL
Hgb A1c MFr Bld: 8.4 % — ABNORMAL HIGH (ref 4.8–5.6)

## 2020-04-17 NOTE — BH Specialist Note (Signed)
Integrated Behavioral Health Follow Up Visit Via Phone  MRN: [id number] Name: Courtney Stafford  Type of Service: Integrated Behavioral Health- Individual/Family Interpretor:No. Interpretor Name and Language: not applicable.   SUBJECTIVE: Courtney Stafford is a 49 y.o. female accompanied by herself. Patient was referred by the diabetes clinic group for mental health. Patient reports the following symptoms/concerns: She explains that there was confusion at the orthopedist's office in regards to paperwork that they did not send her and was not able to get the shot for her pain. She explains that she was supposed to be mailed paper work prior to the appointment but did not receive it so will have to wait to get the shot and is not sure how much longer. She notes that she has a hard time getting comfortable and situated with the pain in her leg. She denies suicidal and homicidal thoughts.  Duration of problem: ; Severity of problem: moderate  OBJECTIVE: Mood: Euthymic and Affect: Appropriate Risk of harm to self or others: No plan to harm self or others  LIFE CONTEXT: Family and Social: see above.  School/Work: see above. Self-Care: see above. Life Changes: see above.   GOALS ADDRESSED: Patient will: 1.  Reduce symptoms of: anxiety  2.  Increase knowledge and/or ability of: self-management skills and stress reduction  3.  Demonstrate ability to: Increase healthy adjustment to current life circumstances  INTERVENTIONS: Interventions utilized:  Supportive Counseling was utilized by the clinician during today's follow up session. Clinician processed with the patient regarding how she has been doing since the last follow up session. Clinician measured the patient's anxiety and depression on a numerical scale. Clinician utilized reflective listening encouraging the patient to ventilate her feelings towards her current situation. Clinician explained to the patient where the place of the physical  therapy is located and that transportation will not be provided by [facility] due to the location being on the bus line. Standardized Assessments completed: GAD-7 and PHQ 9  ASSESSMENT: Patient currently experiencing see above.  Patient may benefit from see above.  PLAN: 1. Follow up with behavioral health clinician on :  2. Behavioral recommendations: see above. 3. Referral(s): Integrated [employer] (In Clinic) 4. "From scale of 1-10, how likely are you to follow plan?":   Althia Forts, LCSW

## 2020-04-22 NOTE — Telephone Encounter (Signed)
Product rx faxed to [facility] for delivery of medication

## 2020-04-22 NOTE — Telephone Encounter (Signed)
Called [employer] to check status. Per rep. It was approved as of today. She was refaxing product order form.

## 2020-04-23 ENCOUNTER — Encounter: Payer: Self-pay | Admitting: Gerontology

## 2020-04-23 ENCOUNTER — Ambulatory Visit: Payer: Self-pay | Admitting: Gerontology

## 2020-04-23 ENCOUNTER — Other Ambulatory Visit: Payer: Self-pay | Admitting: Gerontology

## 2020-04-23 ENCOUNTER — Other Ambulatory Visit: Payer: Self-pay

## 2020-04-23 VITALS — BP 154/96 | HR 71 | Ht 60.0 in | Wt 201.0 lb

## 2020-04-23 DIAGNOSIS — I10 Essential (primary) hypertension: Secondary | ICD-10-CM

## 2020-04-23 DIAGNOSIS — G8929 Other chronic pain: Secondary | ICD-10-CM

## 2020-04-23 DIAGNOSIS — E109 Type 1 diabetes mellitus without complications: Secondary | ICD-10-CM

## 2020-04-23 MED ORDER — MELOXICAM 7.5 MG PO TABS: 7.5000 mg | ORAL_TABLET | Freq: Every day | ORAL | 0 refills | Status: DC

## 2020-04-23 MED ORDER — INSULIN GLARGINE 100 UNIT/ML SOLOSTAR PEN: 30.0000 [IU] | PEN_INJECTOR | Freq: Every day | SUBCUTANEOUS | 4 refills | Status: DC

## 2020-04-23 NOTE — Patient Instructions (Signed)
Increase your lantus to 30 units at bed time. Check your blood sugar 3 times a day before meal.  If your blood sugar before meal is below 70mg /dl eat your meal and check blood sugar then take insulin.

## 2020-04-23 NOTE — Progress Notes (Signed)
Established Patient Office Visit  Subjective:  Patient ID: Courtney Stafford, female    DOB: [birth year]  Age: 49 y.o. MRN: [id number]  CC:  Chief Complaint  Patient presents with  . Diabetes  . Leg Pain    left    HPI Courtney Stafford presents for follow up of type I diabetes, peripheral neuropathy, hypertension and left knee pain.Her HgbA1c that was done on 04/16/2020 increased from 8% to 8.4%. She states that she is compliant with her medications and continues to make life style modifications. She checks her blood glucose TID.  She reports that her fasting blood glucose is between 96 to 309 and evening BG 70 to 271. She states she takes Lantus 28 units at bed time and Novolog 5-9 units before meals. She denies symptoms of hypoglycemia admits to polyuria.  She continues to experience non-radiating sharp pain to left knee She states that taking 800mg  Ibuprofen lowers her left knee pain from 10/10 down to 4/10. She is waiting for approval for hyaluronic injection by the pharmacy for left knee pain that will be admistred  by Orthopedic.  She reports smoking 2-3 cigarettes a day and not interested in quitting. She states she checks her blood pressure at home, but forgot to bring her BP log. She states her BP normally runs 140/80. Overall she states that she is doing well and offers no further complaint.  Past Medical History:  Diagnosis Date  . Diabetes mellitus without complication (HCC)   . Hypertension     Past Surgical History:  Procedure Laterality Date  . CARPAL TUNNEL RELEASE  6 years ago   both hands    Family History  Problem Relation Age of Onset  . Cancer Father   . Breast cancer Neg Hx     Social History   Socioeconomic History  . Marital status: Single    Spouse name: Not on file  . Number of children: 2  . Years of education: Not on file  . Highest education level: High school graduate  Occupational History  . Occupation: unemployed  Tobacco Use  . Smoking  status: Current Some Day Smoker    Packs/day: 0.01    Types: Cigarettes  . Smokeless tobacco: Former and Sexual Activity  . Alcohol use: No  . Drug use: No  . Sexual activity: Not on file  Other Topics Concern  . Not on file  Social History Narrative   Completed biopsychosocial assessment, social determinants screening, administered PHQ 9, and GAD 7.       Social determinants screening completed 01/31/2020. HS She receives bus passes [facility] for transportation. She receives food stamps 192 per month and utilizes a 02/02/2020 when needed. [employer] pays for housing and utilities.  She follows up with therapist at clinic for stress, anxiety, and depression. HS      Support system is small.   Social Determinants of Health   Financial Resource Strain: Low Risk   . Difficulty of Paying Living Expenses: Not very hard  Food Insecurity: No Food Insecurity  . Worried About Programme researcher, broadcasting/film/video in the Last Year: Never true  . Ran Out of Food in the Last Year: Never true  Transportation Needs: Unmet Transportation Needs  . Lack of Transportation (Medical): Yes  . Lack of Transportation (Non-Medical): No  Physical Activity: Insufficiently Active  . Days of Exercise per Week: 3 days  . Minutes of Exercise per Session: 40 min  Stress: Stress Concern Present  . Feeling of Stress : Rather much  Social Connections: Severely Isolated  . Frequency of Communication with Friends and Family: Never  . Frequency of Social Gatherings with Friends and Family: Never  . Attends Religious Services: Never  . Active Member of Clubs or Organizations: No  . Attends Archivist Meetings: Never  . Marital Status: Never married  Intimate Partner Violence: Not At Risk  . Fear of Current or Ex-Partner: No  . Emotionally Abused: No  . Physically Abused: No  . Sexually Abused: No    Outpatient Medications Prior to Visit  Medication Sig Dispense Refill  .  amLODipine (NORVASC) 10 MG tablet Take 1 tablet (10 mg total) by mouth daily. 90 tablet 0  . atorvastatin (LIPITOR) 10 MG tablet Take 1 tablet (10 mg total) by mouth daily. 90 tablet 1  . carvedilol (COREG) 12.5 MG tablet Take 1 tablet (12.5 mg total) by mouth 2 (two) times daily with a meal. 60 tablet 3  . DULoxetine (CYMBALTA) 60 MG capsule Take 1 capsule (60 mg total) by mouth every morning. 30 capsule 2  . gabapentin (NEURONTIN) 300 MG capsule TAKE 2 CAPSULES (600MG ) BY MOUTH 2 TIMES A DAY AND 3 CAPSULES (900MG )AT NIGHT 210 capsule 0  . hydrochlorothiazide (HYDRODIURIL) 25 MG tablet Take 1 tablet (25 mg total) by mouth daily. 90 tablet 1  . insulin aspart (NOVOLOG FLEXPEN) 100 UNIT/ML FlexPen She states taking 5-7 units with meals and adjust with sliding scale as prescribed by Endocrinology.. 75 mL 3  . lisinopril (ZESTRIL) 40 MG tablet Take 1 tablet (40 mg total) by mouth daily. 90 tablet 0  . mirtazapine (REMERON) 30 MG tablet Take 1 tablet (30 mg total) by mouth at bedtime. 30 tablet 2  . insulin glargine (LANTUS) 100 UNIT/ML Solostar Pen Inject 28 Units into the skin at bedtime. 30 mL 4  . Blood Glucose Monitoring Suppl Supplies MISC 1 each by Does not apply route 5 (five) times daily. 500 each 4  . Insulin Pen Needle (BD PEN NEEDLE NANO U/F) 32G X 4 MM MISC 5 times daily 200 each 12  . vitamin B-12 (CYANOCOBALAMIN) 500 MCG tablet Take 1 tablet (500 mcg total) by mouth daily. (Patient not taking: Reported on 02/28/2020) 30 tablet 3   No facility-administered medications prior to visit.    Allergies  Allergen Reactions  . Phenergan [Promethazine] Hives    ROS Review of Systems  Constitutional: Negative.   Eyes: Negative.   Respiratory: Negative.   Cardiovascular: Negative.   Endocrine: Negative.   Musculoskeletal: Positive for arthralgias (Left knee).  Neurological: Positive for numbness (BLE).  Psychiatric/Behavioral: Negative.       Objective:    Physical Exam   Constitutional: She is oriented to person, place, and time. She appears well-developed and well-nourished.  HENT:  Head: Normocephalic.  Eyes: Pupils are equal, round, and reactive to light. EOM are normal.  Cardiovascular: Normal rate.  Pulmonary/Chest: Effort normal and breath sounds normal.  Musculoskeletal:        General: Tenderness (left knee) present.  Neurological: She is alert and oriented to person, place, and time.  Skin: Skin is warm and dry.  Psychiatric: She has a normal mood and affect. Her behavior is normal. Judgment and thought content normal.    BP (!) 154/96 (BP Location: Left Arm, Patient Position: Sitting, Cuff Size: Large)   Pulse 71   Ht 5' (1.524 m)   Wt 201 lb (91.2 kg)  LMP 01/03/2016 (Within Years)   SpO2 97%   BMI 39.26 kg/m  Wt Readings from Last 3 Encounters:  04/23/20 201 lb (91.2 kg)  04/16/20 207 lb 9.6 oz (94.2 kg)  04/15/20 209 lb (94.8 kg)   She was encouraged to continue on her weight loss management.   Health Maintenance Due  Topic Date Due  . Hepatitis C Screening  Never done  . COVID-19 Vaccine (1) Never done  . HIV Screening  Never done  . TETANUS/TDAP  Never done  . OPHTHALMOLOGY EXAM  03/23/2019  . COLONOSCOPY  Never done    There are no preventive care reminders to display for this patient.  Lab Results  Component Value Date   TSH 0.693 06/27/2019   Lab Results  Component Value Date   WBC 9.9 06/27/2019   HGB 15.8 06/27/2019   HCT 49.3 (H) 06/27/2019   MCV 91 06/27/2019   PLT 340 06/27/2019   Lab Results  Component Value Date   NA 138 10/10/2019   K 3.7 10/10/2019   CO2 25 10/10/2019   GLUCOSE 309 (H) 10/10/2019   BUN 11 10/10/2019   CREATININE 0.89 10/10/2019   BILITOT 0.5 06/27/2019   ALKPHOS 114 06/27/2019   AST 13 06/27/2019   ALT 11 06/27/2019   PROT 6.8 06/27/2019   ALBUMIN 4.2 06/27/2019   CALCIUM 9.2 10/10/2019   ANIONGAP 9 02/12/2014   Lab Results  Component Value Date   CHOL 151 06/27/2019    Lab Results  Component Value Date   HDL 60 06/27/2019   Lab Results  Component Value Date   LDLCALC 68 06/27/2019   Lab Results  Component Value Date   TRIG 115 06/27/2019   Lab Results  Component Value Date   CHOLHDL 2.5 06/27/2019   Lab Results  Component Value Date   HGBA1C 8.4 (H) 04/16/2020      Assessment & Plan:     1. Essential hypertension Her blood pressure is not under controll. Her goal should be less than 140/90. She will contineu her current medication and advised DASH diet, and daily exercise as tolerated.  2. Type 1 diabetes mellitus without complication (HCC) Her HgbA1c increased to 8.4% her goal should be less than 6%. -Her Lantus increased to 30 units at bed time, she was advised to continue low carb/non concentrated sweet diet and exercise as tolerated.  insulin glargine (LANTUS) 100 UNIT/ML Solostar Pen; Inject 30 Units into the skin at bedtime.  Dispense: 30 mL; Refill: 4 - HgB A1c; Future  3. Chronic pain of left knee She will start Meloxicam pending orthopedic appointment. She was educated on medication side effects and was advised to contact the clinic as needed. - meloxicam (MOBIC) 7.5 MG tablet; Take 1 tablet (7.5 mg total) by mouth daily.  Dispense: 30 tablet; Refill: 0   Follow-up: Return in about 4 weeks (around 05/21/2020), or if symptoms worsen or fail to improve.    Courtney Priego Trellis Paganini, NP

## 2020-04-24 NOTE — Telephone Encounter (Signed)
Called and scheduled pt for 6/28. If medication isnt received will call to r/s

## 2020-04-29 ENCOUNTER — Telehealth: Payer: Self-pay | Admitting: Licensed Clinical Social Worker

## 2020-04-29 ENCOUNTER — Ambulatory Visit: Payer: Self-pay

## 2020-04-29 NOTE — Telephone Encounter (Signed)
Clinician returned the patient's voicemail regarding needing transportation set up for her appointment at [facility] on Monday June 28th @ 9:15 am. She explained to the patient that she sent an email to set up the transportation and should receive a call from [facility] to confirm.

## 2020-04-30 ENCOUNTER — Other Ambulatory Visit: Payer: Self-pay

## 2020-04-30 ENCOUNTER — Ambulatory Visit: Payer: Self-pay | Attending: Gerontology

## 2020-04-30 DIAGNOSIS — M25562 Pain in left knee: Secondary | ICD-10-CM | POA: Insufficient documentation

## 2020-04-30 DIAGNOSIS — M25662 Stiffness of left knee, not elsewhere classified: Secondary | ICD-10-CM | POA: Insufficient documentation

## 2020-04-30 DIAGNOSIS — G8929 Other chronic pain: Secondary | ICD-10-CM | POA: Insufficient documentation

## 2020-04-30 NOTE — Therapy (Addendum)
[facility] PH[facility]ORTS MEDICINE [address] [place], [address] Phone: [phone]   Fax:  [phone]  Physical Therapy Evaluation  Patient Details  Name: RAYANA GEURIN MRN: [id number] Date of Birth: [birth year] Referring Provider (PT): Daymon Larsen, NP   Encounter Date: 04/30/2020   PT End of Session - 04/30/20 1731    Visit Number 1    Number of Visits 16    Date for PT Re-Evaluation 06/25/20    Authorization Time Period 04/30/20-06/25/20    PT Start Time 0930    PT Stop Time 1030    PT Time Calculation (min) 60 min    Activity Tolerance Patient tolerated treatment well;No increased pain    Behavior During Therapy WFL for tasks assessed/performed           Past Medical History:  Diagnosis Date  . Diabetes mellitus without complication (HCC)   . Hypertension     Past Surgical History:  Procedure Laterality Date  . CARPAL TUNNEL RELEASE  6 years ago   both hands    There were no vitals filed for this visit.    Subjective Assessment - 04/30/20 0937    Subjective Pt here for treatment of chronic left knee pain.    Pertinent History Pt presents to OPPT for ongoing progressive Left knee pain. Pt was seen by Allie Bossier in May 2021, who diagnosed knee OA questionable need for TKA in future, has recommended conservative management at this point. Pt had a cortizone injection in May (without avail) and is shceduled for a 'gel' injection on 6/28.    How long can you sit comfortably? <30 minutes    How long can you stand comfortably? ~10 minutes    How long can you walk comfortably? ~roughly short community distances    Diagnostic tests [place], Xray (unremarkable); MRI with "bone on bone" action going on    Patient Stated Goals Be able to tolerate walking better, teach independent means to manage symptoms    Currently in Pain? Yes    Pain Score 6     Pain Location Knee    Pain Orientation Left               [facility] PT Assessment - 04/30/20 0001      Assessment   Medical Diagnosis Left knee OA    Referring Provider (PT) Daymon Larsen, NP    Onset Date/Surgical Date --   >60months ago   Hand Dominance Right    Next MD Visit 05/12/20    Prior Therapy none      Precautions   Precautions None      Restrictions   Weight Bearing Restrictions No      Balance Screen   Has the patient fallen in the past 6 months No   2 close calls   Has the patient had a decrease in activity level because of a fear of falling?  Yes    Is the patient reluctant to leave their home because of a fear of falling?  No      Prior Function   Level of Independence Independent with basic ADLs;Independent with community mobility without device    Vocation --   does not work   Gaffer --   3 steps to enter, 1 rail, home on 1 level      Observation/Other Assessments   Focus on Therapeutic Outcomes (FOTO)  41/100      ROM / Strength  AROM / PROM / Strength PROM      PROM   PROM Assessment Site Knee;Hip    Right/Left Hip Right;Left    Right Hip Flexion --   WNL   Right Hip External Rotation  --   ~60   Right Hip Internal Rotation  --   ~20   Left Hip Flexion --   WNL   Left Hip External Rotation  --   ~70   Left Hip Internal Rotation  --   ~25   Right/Left Knee Right;Left    Right Knee Extension 10    Right Knee Flexion 120   pain at end range   Left Knee Extension 10    Left Knee Flexion 122   pain at end range     Ambulation/Gait   Ambulation/Gait Yes    Ambulation Distance (Feet) 225 Feet    Assistive device None    Gait Pattern --   0.25m/s   Gait Comments Post AMB: 97% SpO2, 86 BPM (HR); NPRS: 7/10 lateral Lt knee   Heavy left compensated trendelenburg; progressive restrcted            Interventions  Therapeutic Exercise - Seated Heel Slides x10 - Supine Heel Slides x10 - SAQ x10 L LE    Manual Therapy - distraction mobilization with knee at 90 degrees (3x30sec with grade 4  mobilization)          Objective measurements completed on examination: See above findings.               PT Education - 04/30/20 1355    Education Details Nature of OA and primary goal of improving repeated movement to lubricate joint and maintain ROM of knees    Person(s) Educated Patient    Methods Explanation;Demonstration    Comprehension Verbalized understanding;Returned demonstration;Need further instruction            PT Short Term Goals - 04/30/20 1742      PT SHORT TERM GOAL #1   Title Patient will be able to tolerate walking a distance of >584ft without having to take a rest break.    Baseline ~225 ft at Eval    Time 4    Period Weeks    Status New    Target Date 05/28/20             PT Long Term Goals - 04/30/20 1747      PT LONG TERM GOAL #1   Title Patient will demostrate an increase in activity tolerance by being able to ambulate a distance of >1035ft without rest breaks.    Baseline ~233ft at Eval    Time 8    Period Weeks    Status New    Target Date 06/25/20      PT LONG TERM GOAL #2   Title Patient will have a FOTO score of >56/100 to show an increase in independence while performing functional daily activities.    Baseline 41/100 at Eval.    Time 8    Period Weeks    Status New    Target Date 06/25/20      PT LONG TERM GOAL #3   Title Patient will demonstrate improved facility and steadiness in ambulation while performing 10MWT at a gait speed of >0.12m/s.    Baseline Self Selected gait speed <0.4    Time 8    Status New    Target Date 06/25/20  Plan - 04/30/20 1733    Clinical Impression Statement Patient presented to OPPT with chronic Left knee pain. Per patient report, her L knee is limiting her ability to perform day to day self-care and daily activities in community and this can be show by her evaluations findings which are consistent with typical presentation of Left Knee OA. Patient was able  to ambulate ~257ft before taking a rest break due to SOB and L knee pain. During the gait cycle, the patient ambulated with left sided compensated Trendelenberg, limited knee flexion, and increased L foot pronation when compared to contralateral LE. Patient also presents with limited PROM/AROM knee flexion with pain at end range. Patient will benefit from skilled PT in order to progress towards goals, increase ROM, and decrease pain in order to return to PLOF.   Personal Factors and Comorbidities Age;Behavior Pattern;Fitness;Transportation;Time since onset of injury/illness/exacerbation    Examination-Activity Limitations Toileting;Dressing;Sit;Transfers;Bed Mobility;Lift;Bend;Squat;Locomotion Level;Stairs;Carry;Stand    Examination-Participation Restrictions Yard Work;Community Activity;Shop    Stability/Clinical Decision Making Stable/Uncomplicated    Clinical Decision Making Low    Rehab Potential Good    PT Frequency 2x / week    PT Duration 8 weeks    PT Treatment/Interventions ADLs/Self Care Home Management;Cryotherapy;Electrical Stimulation;Moist Heat;DME Instruction;Gait training;Stair training;Functional mobility training;Therapeutic activities;Therapeutic exercise;Balance training;Neuromuscular re-education;Patient/family education;Manual techniques;Passive range of motion;Dry needling;Taping;Energy conservation;Ultrasound    PT Next Visit Plan Review HEP, Strength, ROM, perform 5xSTS    PT Home Exercise Plan Hooklying and Seated Heel Slides and SAQ    Consulted and Agree with Plan of Care Patient           Patient will benefit from skilled therapeutic intervention in order to improve the following deficits and impairments:  Decreased endurance, Decreased mobility, Abnormal gait, Difficulty walking, Decreased range of motion, Obesity, Decreased activity tolerance, Decreased knowledge of use of DME, Decreased strength, Postural dysfunction, Pain, Improper body mechanics,  Hypomobility  Visit Diagnosis: Chronic pain of left knee  Stiffness of left knee, not elsewhere classified     Problem List Patient Active Problem List   Diagnosis Date Noted  . Unilateral primary osteoarthritis, left knee 03/17/2020  . Left knee pain 12/11/2019  . Swelling of lower leg 12/04/2019  . Pain and swelling of lower leg, left 12/04/2019  . Insomnia 08/28/2019  . 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 HTN (hypertension) 10/22/2015  . Type 1 diabetes (HCC) 10/22/2015  . Anemia 10/22/2015  . Restless legs 10/22/2015  . Hematuria 04/09/2015   This entire session was performed under the direct supervision of a liscensed physical therapist. I have reviewed and agree with student's findings and recommendations.  9:25 PM, 05/01/20 Rosamaria Lints, PT, DPT Physical Therapist - [place] [phone] (Office)    Luanna Salk 04/30/2020, 6:16 PM  [place] [facility] PHYSICAL AND SPORTS MEDICINE [address], [place], [address] Phone: [phone]   Fax:  [phone]  Name: LYZETTE REINHARDT MRN: [id number] Date of Birth: [birth year]

## 2020-05-06 ENCOUNTER — Ambulatory Visit: Payer: Self-pay | Admitting: Licensed Clinical Social Worker

## 2020-05-06 ENCOUNTER — Other Ambulatory Visit: Payer: Self-pay

## 2020-05-06 DIAGNOSIS — F331 Major depressive disorder, recurrent, moderate: Secondary | ICD-10-CM

## 2020-05-06 DIAGNOSIS — F411 Generalized anxiety disorder: Secondary | ICD-10-CM

## 2020-05-06 DIAGNOSIS — F4310 Post-traumatic stress disorder, unspecified: Secondary | ICD-10-CM

## 2020-05-06 NOTE — BH Specialist Note (Signed)
Integrated Behavioral Health Follow Up Visit Via Phone  MRN: [id number] Name: Courtney Stafford  Type of Service: Integrated Behavioral Health- Individual/Family Interpretor:No. Interpretor Name and Language: not applicable.   SUBJECTIVE: Courtney Stafford is a 49 y.o. female accompanied by herself. Patient was referred by diabetes clinic at [facility].  Patient reports the following symptoms/concerns: She notes that she had her first physical therapy session this week. She notes that she will have to cancel one physical therapy session due to a conflicting appointment in [place]. She reports that she goes Monday to [place] to get her shot and asked if the transportation was scheduled. She reports that her landlord set up something that an organization brings her lunches on Thursday's. She notes that the lady that brings the lunches has been through a similar situation with pain. She reports that her level of pain is at an 8 on scale of 1 to 10. She notes that her one friend she does not really talk to much at all and is sort of glad in a way because of how at times he would upset her. She notes that some days when she is not too much pain that she will get out to walk a little bit. She denies suicidal and homicidal thoughts.  Duration of problem:  Severity of problem: moderate  OBJECTIVE: Mood: Euthymic and Affect: Appropriate Risk of harm to self or others: No plan to harm self or others  LIFE CONTEXT: Family and Social: see above. School/Work: see above. Self-Care: see above. Life Changes: see above.   GOALS ADDRESSED: Patient will: 1.  Reduce symptoms of: stress  2.  Increase knowledge and/or ability of: coping skills and self-management skills  3.  Demonstrate ability to: Increase healthy adjustment to current life circumstances  INTERVENTIONS: Interventions utilized:  Supportive Counseling was utilized by the clinician during today's follow up session. Clinician processed with the  patient regarding how she has been doing since the last follow up session. Clinician measured the patient's anxiety and depression on a numerical scale. Clinician explained to the patient that it sounds like she has expanded her support system by finding commonalities with the lady who delivers her lunches on Thursday's. Clinician explained to the patient that it was nice of her landlord to set up that kind of resource for her. Clinician explained to the patient that hopefully when she gets the injection along with physical therapy it will help with her pain and mobility.  Standardized Assessments completed: GAD-7 and PHQ 9  ASSESSMENT: Patient currently experiencing see above.   Patient may benefit from see above.  PLAN: 1. Follow up with behavioral health clinician on :  2. Behavioral recommendations:  3. Referral(s): Integrated [employer] (In Clinic) 4. "From scale of 1-10, how likely are you to follow plan?":   Courtney Forts, LCSW

## 2020-05-07 ENCOUNTER — Ambulatory Visit: Payer: Self-pay

## 2020-05-07 DIAGNOSIS — M25562 Pain in left knee: Secondary | ICD-10-CM

## 2020-05-07 DIAGNOSIS — M25662 Stiffness of left knee, not elsewhere classified: Secondary | ICD-10-CM

## 2020-05-07 NOTE — Telephone Encounter (Signed)
Called [employer], Medication is suppose to be delivered today 05/07/20

## 2020-05-07 NOTE — Therapy (Signed)
[facility] PHYSICAL AND SPORTS MEDICINE [address] [place], [address] Phone: [phone]   Fax:  [phone]  Physical Therapy Treatment  Patient Details  Name: Courtney Stafford MRN: [id number] Date of Birth: 08-20-1971 Referring Provider (PT): Daymon Larsen, NP   Encounter Date: 05/07/2020   PT End of Session - 05/07/20 1142    Visit Number 2    Number of Visits 16    Date for PT Re-Evaluation 06/25/20    Authorization Time Period 04/30/20-06/25/20    PT Start Time 1115    PT Stop Time 1200    PT Time Calculation (min) 45 min    Activity Tolerance Patient tolerated treatment well;No increased pain    Behavior During Therapy WFL for tasks assessed/performed           Past Medical History:  Diagnosis Date  . Diabetes mellitus without complication (HCC)   . Hypertension     Past Surgical History:  Procedure Laterality Date  . CARPAL TUNNEL RELEASE  6 years ago   both hands    There were no vitals filed for this visit.   Subjective Assessment - 05/07/20 1136    Subjective Pt reports that her L Knee is painful today    Pertinent History Pt presents to OPPT for ongoing progressive Left knee pain. Pt was seen by Allie Bossier in May 2021, who diagnosed knee OA questionable need for TKA in future, has recommended conservative management at this point. Pt had a cortizone injection in May (without avail) and is shceduled for a 'gel' injection on 6/28.    Limitations Walking;Lifting;Standing    How long can you sit comfortably? <30 minutes    How long can you stand comfortably? ~10 minutes    How long can you walk comfortably? ~roughly short community distances    Diagnostic tests [place], Xray (unremarkable); MRI with "bone on bone" action going on    Patient Stated Goals Be able to tolerate walking better, teach independent means to manage symptoms    Currently in Pain? Yes    Pain Score 7     Pain Location Knee    Pain  Orientation Left           INTERVENTION   Therapeutic Exercise  Seated Knee extension with foot on ball 2x10 L LE  Seated SLR x10 (about 90 degrees)  Seated Leg Curl with RTB x 15 L LE  Semi-tandem gait walking pattern ~162ft (pain in knee and anterior lower leg)  Stair training with step to pattern ascending and descending while using rails (educated on proper technique for traversing stairs) x1  Seated Resisted Terminal Knee Extension x10 with 3secH   Performed exercises to improve LE strength and gait speed.  5xSTS performed at todays session: 19.2sec with using hands for push-off        PT Education - 05/07/20 1137    Education Details Educated patient on exercises that will help decrease pain and inprove function    Person(s) Educated Patient    Methods Explanation;Demonstration    Comprehension Verbalized understanding;Returned demonstration            PT Short Term Goals - 04/30/20 1742      PT SHORT TERM GOAL #1   Title Patient will be able to tolerate walking a distance of >579ft without having to take a rest break.    Baseline ~225 ft at Eval    Time 4    Period Weeks  Status New    Target Date 05/28/20             PT Long Term Goals - 04/30/20 1747      PT LONG TERM GOAL #1   Title Patient will demostrate an increase in activity tolerance by being able to ambulate a distance of >1036ft without rest breaks.    Baseline ~235ft at Eval    Time 8    Period Weeks    Status New    Target Date 06/25/20      PT LONG TERM GOAL #2   Title Patient will have a FOTO score of >56/100 to show an increase in independence while performing functional daily activities.    Baseline 41/100 at Eval.    Time 8    Period Weeks    Status New    Target Date 06/25/20      PT LONG TERM GOAL #3   Title Patient will demonstrate improved facility and steadiness in ambulation while performing 10MWT at a gait speed of >0.33m/s.    Baseline Self Selected gait speed  <0.4    Time 8    Status New    Target Date 06/25/20                 Plan - 05/07/20 1143    Clinical Impression Statement Pt completed today's exercises without an increase in pain from beginning of session.  Pt had some mild knee pain while walking and stair climbing which decrease after exercises by end of session. Pt educated on proper technique for traversing stairs in order to help limit pain that is caused by using the stairs out in community and home. Pt L LE moderately fatigued at end of session.  Pt will continue to benefit from skilled PT in order to help return to PLOF.   Personal Factors and Comorbidities Age;Behavior Pattern;Fitness;Transportation;Time since onset of injury/illness/exacerbation    Examination-Activity Limitations Toileting;Dressing;Sit;Transfers;Bed Mobility;Lift;Bend;Squat;Locomotion Level;Stairs;Carry;Stand    Examination-Participation Restrictions Yard Work;Community Activity;Shop    Stability/Clinical Decision Making Stable/Uncomplicated    Clinical Decision Making Low    Rehab Potential Good    PT Frequency 2x / week    PT Duration 8 weeks    PT Treatment/Interventions ADLs/Self Care Home Management;Cryotherapy;Electrical Stimulation;Moist Heat;DME Instruction;Gait training;Stair training;Functional mobility training;Therapeutic activities;Therapeutic exercise;Balance training;Neuromuscular re-education;Patient/family education;Manual techniques;Passive range of motion;Dry needling;Taping;Energy conservation;Ultrasound    PT Next Visit Plan Progress strengthening and ROM    PT Home Exercise Plan Hooklying and Seated Heel Slides and SAQ    Consulted and Agree with Plan of Care Patient           Patient will benefit from skilled therapeutic intervention in order to improve the following deficits and impairments:  Decreased endurance, Decreased mobility, Abnormal gait, Difficulty walking, Decreased range of motion, Obesity, Decreased activity  tolerance, Decreased knowledge of use of DME, Decreased strength, Postural dysfunction, Pain, Improper body mechanics, Hypomobility  Visit Diagnosis: Chronic pain of left knee  Stiffness of left knee, not elsewhere classified     Problem List Patient Active Problem List   Diagnosis Date Noted  . Unilateral primary osteoarthritis, left knee 03/17/2020  . Left knee pain 12/11/2019  . Swelling of lower leg 12/04/2019  . Pain and swelling of lower leg, left 12/04/2019  . Insomnia 08/28/2019  . 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 HTN (hypertension) 10/22/2015  . Type 1 diabetes ([place]) 10/22/2015  . Anemia 10/22/2015  . Restless legs 10/22/2015  .  Hematuria 04/09/2015   11:56 AM, 05/07/20 Luanna Salk, SPT Student Physical Therapist [place]  [phone]  Luanna Salk 05/07/2020, 11:45 AM   [facility] PHYSICAL AND SPORTS MEDICINE [address] [place], [address] Phone: [phone]   Fax:  [phone]  Name: Courtney Stafford MRN: [id number] Date of Birth: 01/18/1971

## 2020-05-08 ENCOUNTER — Telehealth: Payer: Self-pay

## 2020-05-08 NOTE — Telephone Encounter (Signed)
Received Monvisc from [facility] for Left Knee injection.

## 2020-05-12 ENCOUNTER — Other Ambulatory Visit: Payer: Self-pay

## 2020-05-12 ENCOUNTER — Encounter: Payer: Self-pay | Admitting: Orthopaedic Surgery

## 2020-05-12 ENCOUNTER — Ambulatory Visit: Payer: Self-pay

## 2020-05-12 ENCOUNTER — Ambulatory Visit (INDEPENDENT_AMBULATORY_CARE_PROVIDER_SITE_OTHER): Payer: Self-pay | Admitting: Orthopaedic Surgery

## 2020-05-12 DIAGNOSIS — M1712 Unilateral primary osteoarthritis, left knee: Secondary | ICD-10-CM

## 2020-05-12 MED ORDER — HYALURONAN 88 MG/4ML IX SOSY
88.0000 mg | PREFILLED_SYRINGE | INTRA_ARTICULAR | Status: AC | PRN
  Administered 2020-05-12: 88 mg via INTRA_ARTICULAR

## 2020-05-12 NOTE — Progress Notes (Signed)
   Procedure Note  Patient: Courtney Stafford             Date of Birth: 1971-08-10           MRN: [id number]             Visit Date: 05/12/2020  Procedures: Visit Diagnoses:  1. Unilateral primary osteoarthritis, left knee     Large Joint Inj: L knee on 05/12/2020 9:04 AM Indications: diagnostic evaluation and pain Details: 22 G 1.5 in needle, superolateral approach  Arthrogram: No  Medications: 88 mg Hyaluronan 88 MG/4ML Outcome: tolerated well, no immediate complications Procedure, treatment alternatives, risks and benefits explained, specific risks discussed. Consent was given by the patient. Immediately prior to procedure a time out was called to verify the correct patient, procedure, equipment, support staff and site/side marked as required. Patient was prepped and draped in the usual sterile fashion.    The patient comes in today for scheduled hyaluronic acid injection with Monovisc to treat the pain from osteoarthritis in her left knee.  She has tried and failed other forms of conservative treatment including steroid injections.  Her knee pain is daily.  We have talked about weight loss.  We talked about quad strengthening exercises as well as alternating anti-inflammatories with Tylenol arthritis.  I talked about supplements to try as well.  The knee on exam shows no effusion but it is painful throughout its arc of motion and painful to palpation.  It is ligamentously stable.  I explained the rationale behind injection such as hyaluronic acid.  I talked about the risk of these injections and the benefits.  All questions and concerns were answered and addressed.  She tolerated Monovisc in her left knee without difficulty.  Follow-up will be as needed.

## 2020-05-14 ENCOUNTER — Ambulatory Visit: Payer: Self-pay

## 2020-05-21 ENCOUNTER — Ambulatory Visit: Payer: Self-pay | Admitting: Gerontology

## 2020-05-21 ENCOUNTER — Encounter: Payer: Self-pay | Admitting: Gerontology

## 2020-05-21 ENCOUNTER — Other Ambulatory Visit: Payer: Self-pay

## 2020-05-21 VITALS — BP 158/99 | HR 87 | Temp 98.7°F | Ht 60.0 in | Wt 208.5 lb

## 2020-05-21 DIAGNOSIS — E109 Type 1 diabetes mellitus without complications: Secondary | ICD-10-CM

## 2020-05-21 DIAGNOSIS — R350 Frequency of micturition: Secondary | ICD-10-CM

## 2020-05-21 DIAGNOSIS — G8929 Other chronic pain: Secondary | ICD-10-CM

## 2020-05-21 DIAGNOSIS — I10 Essential (primary) hypertension: Secondary | ICD-10-CM

## 2020-05-21 MED ORDER — MELOXICAM 7.5 MG PO TABS: 7.5000 mg | ORAL_TABLET | Freq: Every day | ORAL | 0 refills | Status: DC

## 2020-05-21 NOTE — Patient Instructions (Signed)
Carbohydrate Counting for Diabetes Mellitus, Adult  Carbohydrate counting is a method of keeping track of how many carbohydrates you eat. Eating carbohydrates naturally increases the amount of sugar (glucose) in the blood. Counting how many carbohydrates you eat helps keep your blood glucose within normal limits, which helps you manage your diabetes (diabetes mellitus). It is important to know how many carbohydrates you can safely have in each meal. This is different for every person. A diet and nutrition specialist (registered dietitian) can help you make a meal plan and calculate how many carbohydrates you should have at each meal and snack. Carbohydrates are found in the following foods:  Grains, such as breads and cereals.  Dried beans and soy products.  Starchy vegetables, such as potatoes, peas, and corn.  Fruit and fruit juices.  Milk and yogurt.  Sweets and snack foods, such as cake, cookies, candy, chips, and soft drinks. How do I count carbohydrates? There are two ways to count carbohydrates in food. You can use either of the methods or a combination of both. Reading "Nutrition Facts" on packaged food The "Nutrition Facts" list is included on the labels of almost all packaged foods and beverages in the [place] It includes:  The serving size.  Information about nutrients in each serving, including the grams (g) of carbohydrate per serving. To use the "Nutrition Facts":  Decide how many servings you will have.  Multiply the number of servings by the number of carbohydrates per serving.  The resulting number is the total amount of carbohydrates that you will be having. Learning standard serving sizes of other foods When you eat carbohydrate foods that are not packaged or do not include "Nutrition Facts" on the label, you need to measure the servings in order to count the amount of carbohydrates:  Measure the foods that you will eat with a food scale or measuring cup, if needed.   Decide how many standard-size servings you will eat.  Multiply the number of servings by 15. Most carbohydrate-rich foods have about 15 g of carbohydrates per serving. ? For example, if you eat 8 oz (170 g) of strawberries, you will have eaten 2 servings and 30 g of carbohydrates (2 servings x 15 g = 30 g).  For foods that have more than one food mixed, such as soups and casseroles, you must count the carbohydrates in each food that is included. The following list contains standard serving sizes of common carbohydrate-rich foods. Each of these servings has about 15 g of carbohydrates:   hamburger bun or  English muffin.   oz (15 mL) syrup.   oz (14 g) jelly.  1 slice of bread.  1 six-inch tortilla.  3 oz (85 g) cooked rice or pasta.  4 oz (113 g) cooked dried beans.  4 oz (113 g) starchy vegetable, such as peas, corn, or potatoes.  4 oz (113 g) hot cereal.  4 oz (113 g) mashed potatoes or  of a large baked potato.  4 oz (113 g) canned or frozen fruit.  4 oz (120 mL) fruit juice.  4-6 crackers.  6 chicken nuggets.  6 oz (170 g) unsweetened dry cereal.  6 oz (170 g) plain fat-free yogurt or yogurt sweetened with artificial sweeteners.  8 oz (240 mL) milk.  8 oz (170 g) fresh fruit or one small piece of fruit.  24 oz (680 g) popped popcorn. Example of carbohydrate counting Sample meal  3 oz (85 g) chicken breast.  6 oz (170 g)   brown rice.  4 oz (113 g) corn.  8 oz (240 mL) milk.  8 oz (170 g) strawberries with sugar-free whipped topping. Carbohydrate calculation 1. Identify the foods that contain carbohydrates: ? Rice. ? Corn. ? Milk. ? Strawberries. 2. Calculate how many servings you have of each food: ? 2 servings rice. ? 1 serving corn. ? 1 serving milk. ? 1 serving strawberries. 3. Multiply each number of servings by 15 g: ? 2 servings rice x 15 g = 30 g. ? 1 serving corn x 15 g = 15 g. ? 1 serving milk x 15 g = 15 g. ? 1 serving  strawberries x 15 g = 15 g. 4. Add together all of the amounts to find the total grams of carbohydrates eaten: ? 30 g + 15 g + 15 g + 15 g = 75 g of carbohydrates total. Summary  Carbohydrate counting is a method of keeping track of how many carbohydrates you eat.  Eating carbohydrates naturally increases the amount of sugar (glucose) in the blood.  Counting how many carbohydrates you eat helps keep your blood glucose within normal limits, which helps you manage your diabetes.  A diet and nutrition specialist (registered dietitian) can help you make a meal plan and calculate how many carbohydrates you should have at each meal and snack. This information is not intended to replace advice given to you by your health care provider. Make sure you discuss any questions you have with your health care provider. Document Revised: 05/26/2017 Document Reviewed: 04/14/2016 Elsevier Patient Education  2020 [employer] DASH Eating Plan DASH stands for "Dietary Approaches to Stop Hypertension." The DASH eating plan is a healthy eating plan that has been shown to reduce high blood pressure (hypertension). It may also reduce your risk for type 2 diabetes, heart disease, and stroke. The DASH eating plan may also help with weight loss. What are tips for following this plan?  General guidelines  Avoid eating more than 2,300 mg (milligrams) of salt (sodium) a day. If you have hypertension, you may need to reduce your sodium intake to 1,500 mg a day.  Limit alcohol intake to no more than 1 drink a day for nonpregnant women and 2 drinks a day for men. One drink equals 12 oz of beer, 5 oz of wine, or 1 oz of hard liquor.  Work with your health care provider to maintain a healthy body weight or to lose weight. Ask what an ideal weight is for you.  Get at least 30 minutes of exercise that causes your heart to beat faster (aerobic exercise) most days of the week. Activities may include walking, swimming, or  biking.  Work with your health care provider or diet and nutrition specialist (dietitian) to adjust your eating plan to your individual calorie needs. Reading food labels   Check food labels for the amount of sodium per serving. Choose foods with less than 5 percent of the Daily Value of sodium. Generally, foods with less than 300 mg of sodium per serving fit into this eating plan.  To find whole grains, look for the word "whole" as the first word in the ingredient list. Shopping  Buy products labeled as "low-sodium" or "no salt added."  Buy fresh foods. Avoid canned foods and premade or frozen meals. Cooking  Avoid adding salt when cooking. Use salt-free seasonings or herbs instead of table salt or sea salt. Check with your health care provider or pharmacist before using salt substitutes.  Do not   fry foods. Cook foods using healthy methods such as baking, boiling, grilling, and broiling instead.  Cook with heart-healthy oils, such as olive, canola, soybean, or sunflower oil. Meal planning  Eat a balanced diet that includes: ? 5 or more servings of fruits and vegetables each day. At each meal, try to fill half of your plate with fruits and vegetables. ? Up to 6-8 servings of whole grains each day. ? Less than 6 oz of lean meat, poultry, or fish each day. A 3-oz serving of meat is about the same size as a deck of cards. One egg equals 1 oz. ? 2 servings of low-fat dairy each day. ? A serving of nuts, seeds, or beans 5 times each week. ? Heart-healthy fats. Healthy fats called Omega-3 fatty acids are found in foods such as flaxseeds and coldwater fish, like sardines, salmon, and mackerel.  Limit how much you eat of the following: ? Canned or prepackaged foods. ? Food that is high in trans fat, such as fried foods. ? Food that is high in saturated fat, such as fatty meat. ? Sweets, desserts, sugary drinks, and other foods with added sugar. ? Full-fat dairy products.  Do not salt  foods before eating.  Try to eat at least 2 vegetarian meals each week.  Eat more home-cooked food and less restaurant, buffet, and fast food.  When eating at a restaurant, ask that your food be prepared with less salt or no salt, if possible. What foods are recommended? The items listed may not be a complete list. Talk with your dietitian about what dietary choices are best for you. Grains Whole-grain or whole-wheat bread. Whole-grain or whole-wheat pasta. Brown rice. Oatmeal. Quinoa. Bulgur. Whole-grain and low-sodium cereals. Pita bread. Low-fat, low-sodium crackers. Whole-wheat flour tortillas. Vegetables Fresh or frozen vegetables (raw, steamed, roasted, or grilled). Low-sodium or reduced-sodium tomato and vegetable juice. Low-sodium or reduced-sodium tomato sauce and tomato paste. Low-sodium or reduced-sodium canned vegetables. Fruits All fresh, dried, or frozen fruit. Canned fruit in natural juice (without added sugar). Meat and other protein foods Skinless chicken or turkey. Ground chicken or turkey. Pork with fat trimmed off. Fish and seafood. Egg whites. Dried beans, peas, or lentils. Unsalted nuts, nut butters, and seeds. Unsalted canned beans. Lean cuts of beef with fat trimmed off. Low-sodium, lean deli meat. Dairy Low-fat (1%) or fat-free (skim) milk. Fat-free, low-fat, or reduced-fat cheeses. Nonfat, low-sodium ricotta or cottage cheese. Low-fat or nonfat yogurt. Low-fat, low-sodium cheese. Fats and oils Soft margarine without trans fats. Vegetable oil. Low-fat, reduced-fat, or light mayonnaise and salad dressings (reduced-sodium). Canola, safflower, olive, soybean, and sunflower oils. Avocado. Seasoning and other foods Herbs. Spices. Seasoning mixes without salt. Unsalted popcorn and pretzels. Fat-free sweets. What foods are not recommended? The items listed may not be a complete list. Talk with your dietitian about what dietary choices are best for you. Grains Baked goods  made with fat, such as croissants, muffins, or some breads. Dry pasta or rice meal packs. Vegetables Creamed or fried vegetables. Vegetables in a cheese sauce. Regular canned vegetables (not low-sodium or reduced-sodium). Regular canned tomato sauce and paste (not low-sodium or reduced-sodium). Regular tomato and vegetable juice (not low-sodium or reduced-sodium). Pickles. Olives. Fruits Canned fruit in a light or heavy syrup. Fried fruit. Fruit in cream or butter sauce. Meat and other protein foods Fatty cuts of meat. Ribs. Fried meat. Bacon. Sausage. Bologna and other processed lunch meats. Salami. Fatback. Hotdogs. Bratwurst. Salted nuts and seeds. Canned beans with added salt.   Canned or smoked fish. Whole eggs or egg yolks. Chicken or turkey with skin. Dairy Whole or 2% milk, cream, and half-and-half. Whole or full-fat cream cheese. Whole-fat or sweetened yogurt. Full-fat cheese. Nondairy creamers. Whipped toppings. Processed cheese and cheese spreads. Fats and oils Butter. Stick margarine. Lard. Shortening. Ghee. Bacon fat. Tropical oils, such as coconut, palm kernel, or palm oil. Seasoning and other foods Salted popcorn and pretzels. Onion salt, garlic salt, seasoned salt, table salt, and sea salt. Worcestershire sauce. Tartar sauce. Barbecue sauce. Teriyaki sauce. Soy sauce, including reduced-sodium. Steak sauce. Canned and packaged gravies. Fish sauce. Oyster sauce. Cocktail sauce. Horseradish that you find on the shelf. Ketchup. Mustard. Meat flavorings and tenderizers. Bouillon cubes. Hot sauce and Tabasco sauce. Premade or packaged marinades. Premade or packaged taco seasonings. Relishes. Regular salad dressings. Where to find more information:  National Heart, Lung, and Blood Institute: www.nhlbi.nih.gov  American Heart Association: www.heart.org Summary  The DASH eating plan is a healthy eating plan that has been shown to reduce high blood pressure (hypertension). It may also reduce  your risk for type 2 diabetes, heart disease, and stroke.  With the DASH eating plan, you should limit salt (sodium) intake to 2,300 mg a day. If you have hypertension, you may need to reduce your sodium intake to 1,500 mg a day.  When on the DASH eating plan, aim to eat more fresh fruits and vegetables, whole grains, lean proteins, low-fat dairy, and heart-healthy fats.  Work with your health care provider or diet and nutrition specialist (dietitian) to adjust your eating plan to your individual calorie needs. This information is not intended to replace advice given to you by your health care provider. Make sure you discuss any questions you have with your health care provider. Document Revised: 10/14/2017 Document Reviewed: 10/25/2016 Elsevier Patient Education  2020 Elsevier Inc.  

## 2020-05-21 NOTE — Progress Notes (Signed)
Established Patient Office Visit  Subjective:  Patient ID: Courtney Stafford, female    DOB: [birth year]  Age: 49 y.o. MRN: [id number]  CC:  Chief Complaint  Patient presents with  . Diabetes    HPI Courtney Stafford presents for follow up of type 1 diabetes, hypertension and left knee pain.   Hypertension: Patient here for follow-up of elevated blood pressure. She checks her blood pressure at home, typically 140s/80s. Patient denies chest pain, dyspnea and syncope.  Cardiovascular risk factors: diabetes mellitus, obesity (BMI >= 30 kg/m2) and smoking/ tobacco exposure. BP is elevated 154/96 today, she reports drinking caffeine this morning. She is working on smoking cessation, now down to 2-3 cigarettes daily from 1ppd.   Diabetes Mellitus: Her last HgbA1c on 04/16/2020 was 8.4% up from 8%, increased Lantus to 30 units at last visit. Symptoms: paresthesia of the feet. Symptoms have been stable. Patient denies foot ulcerations, nausea, polydipsia and vomitting.  Evaluation to date has been included: hemoglobin A1C.  Home sugars: BGs have been labile ranging between 70 and 300. She continues to take insulin as prescribed.   Left knee pain: Patient was seen by orthopedics on 6/28 for hyaluronic acid injection. Pain has been improving since the injection, she rates it as a 6/10. She has continued PT which she reports is helping. She would like a refill of meloxicam, which she takes as needed for pain.   Urinary frequency: Patient reports feeling like she needs to urinate even after she just urinated a normal volume. She states this has been occurring for months. She denies urgency, dysuria or hematuria.    Past Medical History:  Diagnosis Date  . Diabetes mellitus without complication (HCC)   . Hypertension     Past Surgical History:  Procedure Laterality Date  . CARPAL TUNNEL RELEASE  6 years ago   both hands    Family History  Problem Relation Age of Onset  . Cancer Father   . Breast  cancer Neg Hx     Social History   Socioeconomic History  . Marital status: Single    Spouse name: Not on file  . Number of children: 2  . Years of education: Not on file  . Highest education level: High school graduate  Occupational History  . Occupation: unemployed  Tobacco Use  . Smoking status: Current Some Day Smoker    Packs/day: 0.01    Types: Cigarettes  . Smokeless tobacco: Former Neurosurgeon  . Tobacco comment: cutting back  Vaping Use  . Vaping Use: Never used  Substance and Sexual Activity  . Alcohol use: No  . Drug use: No  . Sexual activity: Not on file  Other Topics Concern  . Not on file  Social History Narrative   Completed biopsychosocial assessment, social determinants screening, administered PHQ 9, and GAD 7.       Social determinants screening completed 01/31/2020. HS She receives bus passes [facility] for transportation. She receives food stamps 192 per month and utilizes a Programme researcher, broadcasting/film/video when needed. [employer] pays for housing and utilities.  She follows up with therapist at clinic for stress, anxiety, and depression. HS      Support system is small.   Social Determinants of Health   Financial Resource Strain: Low Risk   . Difficulty of Paying Living Expenses: Not very hard  Food Insecurity: No Food Insecurity  . Worried About Programme researcher, broadcasting/film/video in the Last Year: Never true  . Ran Out  of Food in the Last Year: Never true  Transportation Needs: Unmet Transportation Needs  . Lack of Transportation (Medical): Yes  . Lack of Transportation (Non-Medical): No  Physical Activity: Insufficiently Active  . Days of Exercise per Week: 3 days  . Minutes of Exercise per Session: 40 min  Stress: Stress Concern Present  . Feeling of Stress : Rather much  Social Connections: Socially Isolated  . Frequency of Communication with Friends and Family: Never  . Frequency of Social Gatherings with Friends and Family: Never  . Attends Religious Services:  Never  . Active Member of Clubs or Organizations: No  . Attends BankerClub or Organization Meetings: Never  . Marital Status: Never married  Intimate Partner Violence: Not At Risk  . Fear of Current or Ex-Partner: No  . Emotionally Abused: No  . Physically Abused: No  . Sexually Abused: No    Outpatient Medications Prior to Visit  Medication Sig Dispense Refill  . amLODipine (NORVASC) 10 MG tablet Take 1 tablet (10 mg total) by mouth daily. 90 tablet 0  . atorvastatin (LIPITOR) 10 MG tablet Take 1 tablet (10 mg total) by mouth daily. 90 tablet 1  . carvedilol (COREG) 12.5 MG tablet Take 1 tablet (12.5 mg total) by mouth 2 (two) times daily with a meal. 60 tablet 3  . DULoxetine (CYMBALTA) 60 MG capsule Take 1 capsule (60 mg total) by mouth every morning. 30 capsule 2  . gabapentin (NEURONTIN) 300 MG capsule TAKE 2 CAPSULES (600MG ) BY MOUTH 2 TIMES A DAY AND 3 CAPSULES (900MG )AT NIGHT 210 capsule 0  . hydrochlorothiazide (HYDRODIURIL) 25 MG tablet Take 1 tablet (25 mg total) by mouth daily. 90 tablet 1  . insulin aspart (NOVOLOG FLEXPEN) 100 UNIT/ML FlexPen She states taking 5-7 units with meals and adjust with sliding scale as prescribed by Endocrinology.. 75 mL 3  . insulin glargine (LANTUS) 100 UNIT/ML Solostar Pen Inject 30 Units into the skin at bedtime. 30 mL 4  . lisinopril (ZESTRIL) 40 MG tablet Take 1 tablet (40 mg total) by mouth daily. 90 tablet 0  . mirtazapine (REMERON) 30 MG tablet Take 1 tablet (30 mg total) by mouth at bedtime. 30 tablet 2  . meloxicam (MOBIC) 7.5 MG tablet Take 1 tablet (7.5 mg total) by mouth daily. 30 tablet 0  . Blood Glucose Monitoring Suppl Supplies MISC 1 each by Does not apply route 5 (five) times daily. 500 each 4  . Insulin Pen Needle (BD PEN NEEDLE NANO U/F) 32G X 4 MM MISC 5 times daily 200 each 12  . vitamin B-12 (CYANOCOBALAMIN) 500 MCG tablet Take 1 tablet (500 mcg total) by mouth daily. (Patient not taking: Reported on 05/21/2020) 30 tablet 3   No  facility-administered medications prior to visit.    Allergies  Allergen Reactions  . Phenergan [Promethazine] Hives    ROS Review of Systems  Respiratory: Negative for shortness of breath.   Cardiovascular: Negative for chest pain.  Gastrointestinal: Negative for diarrhea, nausea and vomiting.  Endocrine: Negative for polydipsia and polyuria.  Genitourinary: Positive for frequency. Negative for dysuria, hematuria and urgency.  Musculoskeletal: Positive for arthralgias (left knee).  Neurological: Positive for numbness (BLE).      Objective:    Physical Exam Constitutional:      General: She is not in acute distress.    Appearance: Normal appearance.  HENT:     Head: Normocephalic and atraumatic.  Eyes:     Conjunctiva/sclera: Conjunctivae normal.  Cardiovascular:  Rate and Rhythm: Normal rate and regular rhythm.     Pulses: Normal pulses.          Dorsalis pedis pulses are 2+ on the left side.       Posterior tibial pulses are 2+ on the left side.     Heart sounds: Normal heart sounds. No murmur heard.  No friction rub. No gallop.   Pulmonary:     Effort: Pulmonary effort is normal.     Breath sounds: Normal breath sounds. No wheezing, rhonchi or rales.  Musculoskeletal:       Feet:  Feet:     Left foot:     Skin integrity: No skin breakdown or erythema.  Neurological:     Mental Status: She is alert.     BP (!) 158/99   Pulse 87   Temp 98.7 F (37.1 C)   Ht 5' (1.524 m)   Wt 208 lb 8 oz (94.6 kg)   LMP 01/03/2016 (Within Years)   SpO2 95%   BMI 40.72 kg/m  Wt Readings from Last 3 Encounters:  05/21/20 208 lb 8 oz (94.6 kg)  04/23/20 201 lb (91.2 kg)  04/16/20 207 lb 9.6 oz (94.2 kg)  Encouraged weight loss   Health Maintenance Due  Topic Date Due  . Hepatitis C Screening  Never done  . COVID-19 Vaccine (1) Never done  . HIV Screening  Never done  . TETANUS/TDAP  Never done  . OPHTHALMOLOGY EXAM  03/23/2019  . COLONOSCOPY  Never done     There are no preventive care reminders to display for this patient.  She is due for colonoscopy, however declines at this time because she has no one to accompany her to procedure.   Lab Results  Component Value Date   TSH 0.693 06/27/2019   Lab Results  Component Value Date   WBC 9.9 06/27/2019   HGB 15.8 06/27/2019   HCT 49.3 (H) 06/27/2019   MCV 91 06/27/2019   PLT 340 06/27/2019   Lab Results  Component Value Date   NA 138 10/10/2019   K 3.7 10/10/2019   CO2 25 10/10/2019   GLUCOSE 309 (H) 10/10/2019   BUN 11 10/10/2019   CREATININE 0.89 10/10/2019   BILITOT 0.5 06/27/2019   ALKPHOS 114 06/27/2019   AST 13 06/27/2019   ALT 11 06/27/2019   PROT 6.8 06/27/2019   ALBUMIN 4.2 06/27/2019   CALCIUM 9.2 10/10/2019   ANIONGAP 9 02/12/2014   Lab Results  Component Value Date   CHOL 151 06/27/2019   Lab Results  Component Value Date   HDL 60 06/27/2019   Lab Results  Component Value Date   LDLCALC 68 06/27/2019   Lab Results  Component Value Date   TRIG 115 06/27/2019   Lab Results  Component Value Date   CHOLHDL 2.5 06/27/2019   Lab Results  Component Value Date   HGBA1C 8.4 (H) 04/16/2020      Assessment & Plan:   1. Chronic pain of left knee Improved after hyaluronic acid injection with orthopedics on 6/28. Continue meloxicam 7.5mg  as needed. Continue physical therapy.  - meloxicam (MOBIC) 7.5 MG tablet; Take 1 tablet (7.5 mg total) by mouth daily.  Dispense: 30 tablet; Refill: 0  2. Essential hypertension Her blood pressure is not well controlled. Her goal should be 140/90. She will continue her current medication, advised DASH diet and daily exercise as tolerated. Continue to work on smoking cessation.   3. Type 1 diabetes mellitus  without complication (HCC) Her last HbgA1c on 6/2 was 8.4%. Goal HgbA1c less than 6%. Continue Lantus 30 units at bed time. She was advised to continue low carb/non concentrated sweet diet and exercise as tolerated,  wear slippers at home and continue to check feet.   4. Urinary frequency Will check urinalysis and culture today.  - Urinalysis; Future - UA/M w/rflx Culture, Routine; Future  Follow-up: Return in about 9 weeks (around 07/23/2020), or if symptoms worsen or fail to improve.    Gwenlyn Fudge, Student-PA

## 2020-05-27 ENCOUNTER — Ambulatory Visit: Payer: Self-pay | Admitting: Licensed Clinical Social Worker

## 2020-06-03 ENCOUNTER — Ambulatory Visit: Payer: Self-pay | Admitting: Licensed Clinical Social Worker

## 2020-06-03 ENCOUNTER — Other Ambulatory Visit: Payer: Self-pay

## 2020-06-03 DIAGNOSIS — F331 Major depressive disorder, recurrent, moderate: Secondary | ICD-10-CM

## 2020-06-03 DIAGNOSIS — F4310 Post-traumatic stress disorder, unspecified: Secondary | ICD-10-CM

## 2020-06-03 DIAGNOSIS — F411 Generalized anxiety disorder: Secondary | ICD-10-CM

## 2020-06-03 NOTE — BH Specialist Note (Signed)
Integrated Behavioral Health Follow Up Visit Via Phone  MRN: [id number] Name: Courtney Stafford   Type of Service: Integrated Behavioral Health- Individual/Family Interpretor:No. Interpretor Name and Language: not applicable.   SUBJECTIVE: Courtney Stafford is a 49 y.o. female accompanied by herself. Patient was referred by diabetes clinic for mental health.  Patient reports the following symptoms/concerns: She notes that her knee hurts some but its not hurting as bad as it was. She notes that she has an appointment for physical therapy tomorrow but has not been in the last month. She notes that the physical therapy has been going well. She notes that she walk a little bit better than before. She notes that she does not have too many people, cannot get out too much, and gets bored sitting around at home. She notes that next month it will be two months since her son has been gone. She notes that she can be moody at time and become irritable. She notes that she is aggravated with herself due to gaining weight and not being able to exercise. She denies suicidal and homicidal thoughts.  Duration of problem: ; Severity of problem: moderate  OBJECTIVE: Mood: Euthymic and Affect: Appropriate Risk of harm to self or others: No plan to harm self or others  LIFE CONTEXT: Family and Social: see above. School/Work: see above. Self-Care: see above.  Life Changes: see above.  GOALS ADDRESSED: Patient will: 1.  Reduce symptoms of: stress  2.  Increase knowledge and/or ability of: coping skills, healthy habits and self-management skills  3.  Demonstrate ability to: Increase healthy adjustment to current life circumstances  INTERVENTIONS: Interventions utilized:  Supportive Counseling was utilized by the clinician during today's follow up session. Clinician processed with the patient regarding how she has been doing since the last follow up session. Clinician measured the patient's anxiety and depression  on a numerical scale. Clinician asked the patient how things with physical therapy have been going and has she noticed a difference in her mobility since she started the program. Clinician encouraged the patient to focus on the positives rather than the negatives in her life.  Standardized Assessments completed: GAD-7 and PHQ 9  ASSESSMENT: Patient currently experiencing see above.  Patient may benefit from see above.   PLAN: 1. Follow up with behavioral health clinician on :  2. Behavioral recommendations:  3. Referral(s): Integrated [employer] (In Clinic) 4. "From scale of 1-10, how likely are you to follow plan?":   Althia Forts, LCSW

## 2020-06-04 ENCOUNTER — Ambulatory Visit: Payer: Self-pay | Attending: Gerontology

## 2020-06-04 DIAGNOSIS — M25662 Stiffness of left knee, not elsewhere classified: Secondary | ICD-10-CM | POA: Insufficient documentation

## 2020-06-04 DIAGNOSIS — M25562 Pain in left knee: Secondary | ICD-10-CM | POA: Insufficient documentation

## 2020-06-04 DIAGNOSIS — G8929 Other chronic pain: Secondary | ICD-10-CM | POA: Insufficient documentation

## 2020-06-04 NOTE — Therapy (Signed)
[facility] PH[facility]ICAL AND SP[facility]TS MEDICINE [address] [place], [address] Phone: [phone]   Fax:  [phone]  Physical Therapy Treatment  Patient Details  Name: Courtney Stafford MRN: [id number] Date of Birth: 15-Dec-1970 Referring Provider (PT): Daymon Larsen, NP   Encounter Date: 06/04/2020   PT End of Session - 06/04/20 1608    Visit Number 3    Number of Visits 16    Date for PT Re-Evaluation 06/25/20    Authorization Time Period 04/30/20-06/25/20    PT Start Time 1600    PT Stop Time 1645    PT Time Calculation (min) 45 min    Activity Tolerance Patient tolerated treatment well;No increased pain    Behavior During Therapy WFL for tasks assessed/performed           Past Medical History:  Diagnosis Date  . Diabetes mellitus without complication (HCC)   . Hypertension     Past Surgical History:  Procedure Laterality Date  . CARPAL TUNNEL RELEASE  6 years ago   both hands    There were no vitals filed for this visit.   Subjective Assessment - 06/04/20 1605    Subjective Patient reports that she recieved an injection for her knee 05/12/20 and that it has helped but she continues to have pain in her knee.    Pertinent History Pt presents to OPPT for ongoing progressive Left knee pain. Pt was seen by Allie Bossier in May 2021, who diagnosed knee OA questionable need for TKA in future, has recommended conservative management at this point. Pt had a cortizone injection in May (without avail) and is shceduled for a 'gel' injection on 6/28.    Limitations Walking;Lifting;Standing    How long can you sit comfortably? <30 minutes    How long can you stand comfortably? ~10 minutes    How long can you walk comfortably? ~roughly short community distances    Diagnostic tests [place], Xray (unremarkable); MRI with "bone on bone" action going on    Patient Stated Goals Be able to tolerate walking better, teach independent means to manage  symptoms    Currently in Pain? No/denies           INTERVENTION    Therapeutic Exercise  Seated Knee extension with foot on ball 2x10 L LE  STS on mat table 2x12  Seated SLR 2x12 (about 90 degrees)  Seated Leg Curl with RTB 2x12 L LE  Supine Hip ER with RTB at Knees 2x12  Resisted DF with RTB L LE 2x15 Semi-tandem gait walking pattern ~215ft (2:16 minutes) Stair training with step to pattern ascending and descending while using rails (educated on proper technique for traversing stairs) x3   Performed exercises to improve LE strength and gait speed.    PT Education - 06/04/20 1608    Education Details educated patient on exercises    Person(s) Educated Patient    Methods Explanation;Demonstration    Comprehension Verbalized understanding;Returned demonstration            PT Short Term Goals - 04/30/20 1742      PT SHORT TERM GOAL #1   Title Patient will be able to tolerate walking a distance of >558ft without having to take a rest break.    Baseline ~225 ft at Eval    Time 4    Period Weeks    Status New    Target Date 05/28/20  PT Long Term Goals - 04/30/20 1747      PT LONG TERM GOAL #1   Title Patient will demostrate an increase in activity tolerance by being able to ambulate a distance of >1035ft without rest breaks.    Baseline ~24ft at Eval    Time 8    Period Weeks    Status New    Target Date 06/25/20      PT LONG TERM GOAL #2   Title Patient will have a FOTO score of >56/100 to show an increase in independence while performing functional daily activities.    Baseline 41/100 at Eval.    Time 8    Period Weeks    Status New    Target Date 06/25/20      PT LONG TERM GOAL #3   Title Patient will demonstrate improved facility and steadiness in ambulation while performing at a gait speed of >0.38m/s.    Baseline Self Selected gait speed <0.4    Time 8    Status New    Target Date 06/25/20                 Plan -  06/04/20 1609    Clinical Impression Statement Focused on mobility and strengthening at todays session to address deficits.  Patient tolerated todays exercises well without an increase pain.  Patient continues to have limitations in strength and ROM in her right knee.  Patient will continue to benefit from skilled therapy to progress towards goals and return to prior level of function.    Personal Factors and Comorbidities Age;Behavior Pattern;Fitness;Transportation;Time since onset of injury/illness/exacerbation    Examination-Activity Limitations Toileting;Dressing;Sit;Transfers;Bed Mobility;Lift;Bend;Squat;Locomotion Level;Stairs;Carry;Stand    Examination-Participation Restrictions Yard Work;Community Activity;Shop    Stability/Clinical Decision Making Stable/Uncomplicated    Clinical Decision Making Low    Rehab Potential Good    PT Frequency 2x / week    PT Duration 8 weeks    PT Treatment/Interventions ADLs/Self Care Home Management;Cryotherapy;Electrical Stimulation;Moist Heat;DME Instruction;Gait training;Stair training;Functional mobility training;Therapeutic activities;Therapeutic exercise;Balance training;Neuromuscular re-education;Patient/family education;Manual techniques;Passive range of motion;Dry needling;Taping;Energy conservation;Ultrasound    PT Next Visit Plan Progress strengthening and ROM    PT Home Exercise Plan Hooklying and Seated Heel Slides and SAQ    Consulted and Agree with Plan of Care Patient           Patient will benefit from skilled therapeutic intervention in order to improve the following deficits and impairments:  Decreased endurance, Decreased mobility, Abnormal gait, Difficulty walking, Decreased range of motion, Obesity, Decreased activity tolerance, Decreased knowledge of use of DME, Decreased strength, Postural dysfunction, Pain, Improper body mechanics, Hypomobility  Visit Diagnosis: Chronic pain of left knee  Stiffness of left knee, not elsewhere  classified     Problem List Patient Active Problem List   Diagnosis Date Noted  . Urinary frequency 05/21/2020  . Unilateral primary osteoarthritis, left knee 03/17/2020  . Left knee pain 12/11/2019  . Swelling of lower leg 12/04/2019  . Pain and swelling of lower leg, left 12/04/2019  . Insomnia 08/28/2019  . 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 HTN (hypertension) 10/22/2015  . Type 1 diabetes (HCC) 10/22/2015  . Anemia 10/22/2015  . Restless legs 10/22/2015  . Hematuria 04/09/2015   4:45 PM, 06/04/20 Luanna Salk, SPT Student Physical Therapist [place]  [phone]  Luanna Salk 06/04/2020, 4:38 PM   [facility] PHYSICAL AND SPORTS MEDICINE [address], [place], [address]  Phone: [phone]   Fax:  [phone]  Name: Courtney Stafford MRN: [id number] Date of Birth: 01-25-1971

## 2020-06-09 ENCOUNTER — Telehealth: Payer: Self-pay | Admitting: Pharmacist

## 2020-06-09 NOTE — Telephone Encounter (Signed)
06/09/2020 2:18:49 PM - Lantus Vials refill to [facility]  -- [name] - Monday, June 09, 2020 2:18 PM --Sending [employer] refill request to [facility] for Lantus Vials  Inject 28 units at bedtime # 4 boxes.

## 2020-06-11 ENCOUNTER — Ambulatory Visit: Payer: Self-pay

## 2020-06-16 ENCOUNTER — Ambulatory Visit: Payer: Self-pay | Attending: Gerontology

## 2020-06-16 ENCOUNTER — Other Ambulatory Visit: Payer: Self-pay

## 2020-06-16 DIAGNOSIS — M25662 Stiffness of left knee, not elsewhere classified: Secondary | ICD-10-CM | POA: Insufficient documentation

## 2020-06-16 DIAGNOSIS — M25562 Pain in left knee: Secondary | ICD-10-CM | POA: Insufficient documentation

## 2020-06-16 DIAGNOSIS — G8929 Other chronic pain: Secondary | ICD-10-CM | POA: Insufficient documentation

## 2020-06-16 NOTE — Therapy (Signed)
[facility] PHYS[facility] SPORTS MEDICINE [address] [place], [address] Phone: [phone]   Fax:  [phone]  Physical Therapy Treatment  Patient Details  Name: Courtney Stafford MRN: [id number] Date of Birth: [birth year] Referring Provider (PT): Daymon Larsen, NP   Encounter Date: 06/16/2020   PT End of Session - 06/16/20 1037    Visit Number 4    Number of Visits 16    Date for PT Re-Evaluation 06/25/20    Authorization Time Period 04/30/20-06/25/20    PT Start Time 1030    PT Stop Time 1115    PT Time Calculation (min) 45 min    Activity Tolerance Patient tolerated treatment well;No increased pain    Behavior During Therapy WFL for tasks assessed/performed           Past Medical History:  Diagnosis Date   Diabetes mellitus without complication (HCC)    Hypertension     Past Surgical History:  Procedure Laterality Date   CARPAL TUNNEL RELEASE  6 years ago   both hands    There were no vitals filed for this visit.   Subjective Assessment - 06/16/20 1031    Subjective Patient reports that the shot she received has not been helping with her knee and continues to have stiffness and pain.    Pertinent History Pt presents to OPPT for ongoing progressive Left knee pain. Pt was seen by Allie Bossier in May 2021, who diagnosed knee OA questionable need for TKA in future, has recommended conservative management at this point. Pt had a cortizone injection in May (without avail) and is shceduled for a 'gel' injection on 6/28.    Limitations Walking;Lifting;Standing    How long can you sit comfortably? <30 minutes    How long can you stand comfortably? ~10 minutes    How long can you walk comfortably? ~roughly short community distances    Diagnostic tests [place], Xray (unremarkable); MRI with "bone on bone" action going on    Patient Stated Goals Be able to tolerate walking better, teach independent means to manage symptoms     Currently in Pain? Yes    Pain Score 5     Pain Location Knee           INTERVENTION    Therapeutic Exercise  Seated Knee extension with foot on ball 2x10 L LE  STS on mat table 2x12  SLR 2x10  Seated Leg Curl with RTB 2x12 L LE  Supine Hip ER with RTB at Knees 2x12  Resisted DF with RTB L LE 2x15 Stair training with step to pattern ascending and descending while using rails (educated on proper technique for traversing stairs) x3   Performed exercises to improve LE strength and gait speed.     PT Education - 06/16/20 1036    Education Details form/technique with exercise; injection and effects on symptoms    Person(s) Educated Patient    Methods Explanation;Demonstration    Comprehension Verbalized understanding;Returned demonstration            PT Short Term Goals - 04/30/20 1742      PT SHORT TERM GOAL #1   Title Patient will be able to tolerate walking a distance of >571ft without having to take a rest break.    Baseline ~225 ft at Eval    Time 4    Period Weeks    Status New    Target Date 05/28/20  PT Long Term Goals - 04/30/20 1747      PT LONG TERM GOAL #1   Title Patient will demostrate an increase in activity tolerance by being able to ambulate a distance of >1032ft without rest breaks.    Baseline ~275ft at Eval    Time 8    Period Weeks    Status New    Target Date 06/25/20      PT LONG TERM GOAL #2   Title Patient will have a FOTO score of >56/100 to show an increase in independence while performing functional daily activities.    Baseline 41/100 at Eval.    Time 8    Period Weeks    Status New    Target Date 06/25/20      PT LONG TERM GOAL #3   Title Patient will demonstrate improved facility and steadiness in ambulation while performing at a gait speed of >0.41m/s.    Baseline Self Selected gait speed <0.4    Time 8    Status New    Target Date 06/25/20                 Plan - 06/16/20 1037    Clinical  Impression Statement Continued working on mobility and strengthening during the session.  Patient tolerated todays exercises well with no increase in symptoms during the session.  Patient continues to have limitations and per patient, the injection she received from her doctor has not helped with symptoms during ADLs. Patient educated on how the injection she received works and how it can help with symptoms.  Patient will continue to benefit from skilled therapy to progress towards goals and return to prior level of function.    Personal Factors and Comorbidities Age;Behavior Pattern;Fitness;Transportation;Time since onset of injury/illness/exacerbation    Examination-Activity Limitations Toileting;Dressing;Sit;Transfers;Bed Mobility;Lift;Bend;Squat;Locomotion Level;Stairs;Carry;Stand    Examination-Participation Restrictions Yard Work;Community Activity;Shop    Stability/Clinical Decision Making Stable/Uncomplicated    Clinical Decision Making Low    Rehab Potential Good    PT Frequency 2x / week    PT Duration 8 weeks    PT Treatment/Interventions ADLs/Self Care Home Management;Cryotherapy;Electrical Stimulation;Moist Heat;DME Instruction;Gait training;Stair training;Functional mobility training;Therapeutic activities;Therapeutic exercise;Balance training;Neuromuscular re-education;Patient/family education;Manual techniques;Passive range of motion;Dry needling;Taping;Energy conservation;Ultrasound    PT Next Visit Plan Progress strengthening and ROM    PT Home Exercise Plan Hooklying and Seated Heel Slides and SAQ    Consulted and Agree with Plan of Care Patient           Patient will benefit from skilled therapeutic intervention in order to improve the following deficits and impairments:  Decreased endurance, Decreased mobility, Abnormal gait, Difficulty walking, Decreased range of motion, Obesity, Decreased activity tolerance, Decreased knowledge of use of DME, Decreased strength, Postural  dysfunction, Pain, Improper body mechanics, Hypomobility  Visit Diagnosis: Chronic pain of left knee  Stiffness of left knee, not elsewhere classified     Problem List Patient Active Problem List   Diagnosis Date Noted   Urinary frequency 05/21/2020   Unilateral primary osteoarthritis, left knee 03/17/2020   Left knee pain 12/11/2019   Swelling of lower leg 12/04/2019   Pain and swelling of lower leg, left 12/04/2019   Insomnia 08/28/2019   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 [place], [address] Phone: [phone]   Fax:  [phone]  Name: Courtney Stafford MRN: [id number] Date of Birth: 15-Feb-1971

## 2020-06-17 ENCOUNTER — Ambulatory Visit: Payer: Self-pay | Admitting: Licensed Clinical Social Worker

## 2020-06-17 ENCOUNTER — Other Ambulatory Visit: Payer: Self-pay

## 2020-06-17 DIAGNOSIS — F411 Generalized anxiety disorder: Secondary | ICD-10-CM

## 2020-06-17 DIAGNOSIS — F4310 Post-traumatic stress disorder, unspecified: Secondary | ICD-10-CM

## 2020-06-17 DIAGNOSIS — F331 Major depressive disorder, recurrent, moderate: Secondary | ICD-10-CM

## 2020-06-17 MED ORDER — MIRTAZAPINE 30 MG PO TABS: 30.0000 mg | ORAL_TABLET | Freq: Every day | ORAL | 2 refills | Status: DC

## 2020-06-17 MED ORDER — DULOXETINE HCL 60 MG PO CPEP: 60.0000 mg | ORAL_CAPSULE | Freq: Every morning | ORAL | 2 refills | Status: DC

## 2020-06-17 NOTE — BH Specialist Note (Signed)
Integrated Behavioral Health Follow Up Visit  MRN: [id number] Name: Courtney Stafford  Type of Service: Integrated Behavioral Health- Individual/Family Interpretor:No. Interpretor Name and Language: not applicable.   SUBJECTIVE: [name] is a 49 y.o. female accompanied by herself. Patient was referred by the diabetes clinic. Patient reports the following symptoms/concerns: She notes that despite physical therapy that her knee still hurts sometimes. She explains that the physical therapy helps some. She notes that she has been getting out of the house twice a week for physical therapy and sometimes will go outside to get fresh air. She notes that she nothing new has happened. She reports that things are still the same with anxiety and depression. She notes that this month it will be two years since her son has been gone on her birthday. She notes that she has not considered going to a grief support group. She denies suicidal and homicidal thoughts.  Duration of problem:; Severity of problem: moderate  OBJECTIVE: Mood: Euthymic and Affect: Appropriate Risk of harm to self or others: No plan to harm self or others  LIFE CONTEXT: Family and Social: see above. School/Work: see above. Self-Care: see above. Life Changes:see above.   GOALS ADDRESSED: Patient will: 1.  Reduce symptoms of: depression and grief  2.  Increase knowledge and/or ability of: coping skills and self-management skills  3.  Demonstrate ability to: Increase healthy adjustment to current life circumstances  INTERVENTIONS: Interventions utilized:  Supportive Counseling was utilized by the clinician during today's follow up session. Clinician processed with the patient regarding how she has been doing since the last follow up session. Clinician measured the patient's anxiety and depression on a numerical scale. Clinician discussed with the patient has she thought about joining a grief support group to expand her support  system and be able to process her grief with a group of people going through the same thing.  Standardized Assessments completed: GAD-7 and PHQ 9 GAD 7 12 PHQ 9 12 ASSESSMENT: Patient currently experiencing see above.   Patient may benefit from see above.  PLAN: 1. Follow up with behavioral health clinician on :  2. Behavioral recommendations:  3. Referral(s): Integrated [employer] (In Clinic) 4. "From scale of 1-10, how likely are you to follow plan?":  Althia Forts, LCSW

## 2020-06-18 ENCOUNTER — Ambulatory Visit: Payer: Self-pay

## 2020-06-23 ENCOUNTER — Ambulatory Visit: Payer: Self-pay

## 2020-06-25 ENCOUNTER — Telehealth: Payer: Self-pay | Admitting: Pharmacist

## 2020-06-25 ENCOUNTER — Ambulatory Visit: Payer: Self-pay

## 2020-06-25 NOTE — Telephone Encounter (Signed)
06/25/2020 10:07:09 AM - Faxed updated [employer] refill for Lantus Vials  -- [name] - Wednesday, June 25, 2020 10:06 AM --Arneta Cliche updated [employer] refill request for Lantus Vials Inject 28 units daily at bedtime # 4 boxes.

## 2020-06-30 ENCOUNTER — Ambulatory Visit: Payer: Self-pay

## 2020-06-30 ENCOUNTER — Other Ambulatory Visit: Payer: Self-pay

## 2020-06-30 DIAGNOSIS — M25562 Pain in left knee: Secondary | ICD-10-CM

## 2020-06-30 DIAGNOSIS — M25662 Stiffness of left knee, not elsewhere classified: Secondary | ICD-10-CM

## 2020-06-30 DIAGNOSIS — G8929 Other chronic pain: Secondary | ICD-10-CM

## 2020-06-30 NOTE — Therapy (Signed)
[facility] PHYS[facility]ORTS MEDICINE [address], [place], [address] Phone: [phone]   Fax:  [phone]  Physical Therapy Treatment  Patient Details  Name: Courtney Stafford MRN: [id number] Date of Birth: [birth year] Referring Provider (PT): Daymon Larsen, NP   Encounter Date: 06/30/2020   PT End of Session - 06/30/20 1125    Visit Number 5    Number of Visits 16    Date for PT Re-Evaluation 08/11/20    Authorization Time Period 04/30/20-06/25/20; 06/30/20-08/11/20    PT Start Time 1117    PT Stop Time 1157    PT Time Calculation (min) 40 min    Activity Tolerance Patient tolerated treatment well;No increased pain    Behavior During Therapy WFL for tasks assessed/performed           Past Medical History:  Diagnosis Date  . Diabetes mellitus without complication (HCC)   . Hypertension     Past Surgical History:  Procedure Laterality Date  . CARPAL TUNNEL RELEASE  6 years ago   both hands    There were no vitals filed for this visit.   Subjective Assessment - 06/30/20 1123    Subjective Pt reports doing ok today, 6/10 pain which is typical. Pt says HEP is going fair.    Pertinent History Pt presents to OPPT for ongoing progressive Left knee pain. Pt was seen by Allie Bossier in May 2021, who diagnosed knee OA questionable need for TKA in future, has recommended conservative management at this point. Pt had a cortizone injection in May (without avail) and is shceduled for a 'gel' injection on 6/28.    Currently in Pain? Yes    Pain Score 6     Pain Location Knee    Pain Orientation Left              [facility] PT Assessment - 06/30/20 0001      Assessment   Medical Diagnosis Left knee OA    Referring Provider (PT) Daymon Larsen, NP    Onset Date/Surgical Date --   >60months ago   Hand Dominance Right    Prior Therapy none      Balance Screen   Has the patient fallen in the past 6 months No    Has the  patient had a decrease in activity level because of a fear of falling?  No    Is the patient reluctant to leave their home because of a fear of falling?  No      Observation/Other Assessments   Focus on Therapeutic Outcomes (FOTO)  36/100   41/100 at evaluation      PROM   Left Knee Extension 6 degrees   10 at eval   Left Knee Flexion 127   limited by soft tissue; discomfort at end range     Ambulation/Gait   Ambulation/Gait Yes    Ambulation Distance (Feet) 500 Feet   249ft at eval (0.68m/s)    Gait Pattern --   0.24m/s   Gait velocity 0.58m/s (0.53m/s @ evaluation)            INTERVENTION  -AA/ROM Nustep c BUE assist x4 minutes, Seat 4, Arms 8  -seated LLE LAQ 2x15  -standing bilat heel raise 2x15, BUE supported -Supine SAQ Left 2x15 c 4lb AW  -Supine LLE heel slides on slides 1x20    PT Short Term Goals - 06/30/20 1129      PT SHORT TERM GOAL #1  Title Patient will be able to tolerate walking a distance of >519ft without having to take a rest break.    Baseline ~225 ft at Eval; 8/16: 541ft in 4'54" (heavy lateral sway over an abducted LLE)    Time 4    Period Weeks    Status On-going    Target Date 07/28/20             PT Long Term Goals - 06/30/20 1134      PT LONG TERM GOAL #1   Title Patient will demostrate an increase in activity tolerance by being able to ambulate a distance of >1050ft without rest breaks.    Baseline ~249ft at Eval; 8/16: 554ft    Time 8    Period Weeks    Status On-going    Target Date 08/25/20      PT LONG TERM GOAL #2   Title Patient will have a FOTO score of >56/100 to show an increase in independence while performing functional daily activities.    Baseline 41/100 at Eval; 06/30/20: 36/100    Time 8    Period Weeks    Status On-going    Target Date 08/25/20      PT LONG TERM GOAL #3   Title Patient will demonstrate improved facility and steadiness in ambulation while performing at a gait speed of >0.83m/s.    Baseline  Self Selected gait speed <0.4; 06/30/20: 0.27m/s    Time 8    Period Weeks    Status On-going    Target Date 08/25/20                 Plan - 06/30/20 1128    Clinical Impression Statement Reassessment this date: FOTO score showing a decline in function, however pt has improved tolerance to sustained AMB in both distance and speed. ROM is now WNL. Pt making mild progress thus far but has only been able to make 5 visits due to transportation or medical issues. Will agrees that she is improving with help from therapy and would like to continue therapy.     Personal Factors and Comorbidities Age;Behavior Pattern;Fitness;Transportation;Time since onset of injury/illness/exacerbation    Examination-Activity Limitations Toileting;Dressing;Sit;Transfers;Bed Mobility;Lift;Bend;Squat;Locomotion Level;Stairs;Carry;Stand    Examination-Participation Restrictions Yard Work;Community Activity;Shop    Stability/Clinical Decision Making Stable/Uncomplicated    Clinical Decision Making Low    Rehab Potential Good    PT Frequency 2x / week    PT Duration 8 weeks    PT Treatment/Interventions ADLs/Self Care Home Management;Cryotherapy;Electrical Stimulation;Moist Heat;DME Instruction;Gait training;Stair training;Functional mobility training;Therapeutic activities;Therapeutic exercise;Balance training;Neuromuscular re-education;Patient/family education;Manual techniques;Passive range of motion;Dry needling;Taping;Energy conservation;Ultrasound    PT Next Visit Plan Progress strengthening and ROM    PT Home Exercise Plan Hooklying and Seated Heel Slides and SAQ    Consulted and Agree with Plan of Care Patient           Patient will benefit from skilled therapeutic intervention in order to improve the following deficits and impairments:  Decreased endurance, Decreased mobility, Abnormal gait, Difficulty walking, Decreased range of motion, Obesity, Decreased activity tolerance, Decreased knowledge of use  of DME, Decreased strength, Postural dysfunction, Pain, Improper body mechanics, Hypomobility  Visit Diagnosis: Chronic pain of left knee  Stiffness of left knee, not elsewhere classified     Problem List Patient Active Problem List   Diagnosis Date Noted  . Urinary frequency 05/21/2020  . Unilateral primary osteoarthritis, left knee 03/17/2020  . Left knee pain 12/11/2019  . Swelling of lower leg  12/04/2019  . Pain and swelling of lower leg, left 12/04/2019  . Insomnia 08/28/2019  . 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 HTN (hypertension) 10/22/2015  . Type 1 diabetes (HCC) 10/22/2015  . Anemia 10/22/2015  . Restless legs 10/22/2015  . Hematuria 04/09/2015   12:07 PM, 06/30/20 Rosamaria Lints, PT, DPT Physical Therapist - [place] [phone] (Office)   Neiva Maenza C 06/30/2020, 12:03 PM  [facility] PHYSICAL AND SPORTS MEDICINE [address] [place], [address] Phone: [phone]   Fax:  [phone]  Name: GHAZAL PEVEY MRN: [id number] Date of Birth: Apr 04, 1971

## 2020-07-01 ENCOUNTER — Ambulatory Visit: Payer: Self-pay | Admitting: Licensed Clinical Social Worker

## 2020-07-01 DIAGNOSIS — F411 Generalized anxiety disorder: Secondary | ICD-10-CM

## 2020-07-01 DIAGNOSIS — F4310 Post-traumatic stress disorder, unspecified: Secondary | ICD-10-CM

## 2020-07-01 DIAGNOSIS — F331 Major depressive disorder, recurrent, moderate: Secondary | ICD-10-CM

## 2020-07-01 NOTE — BH Specialist Note (Signed)
Integrated Behavioral Health Follow Up Visit Via Phone  MRN: [id number] Name: Courtney Stafford   Type of Service: Integrated Behavioral Health- Individual/Family Interpretor:No. Interpretor Name and Language: not applicable.   SUBJECTIVE: Courtney Stafford is a 49 y.o. female accompanied by herself. Patient was referred by the endocrinology group for mental health. Patient reports the following symptoms/concerns: She notes that she had to change her physical therapy days due to transportation. She notes that she has been stressed about trying to arrange the transportation. She notes that things with her anxiety is about the same. She notes that she is bummed about not having plans on her birthday and will be the two year anniversary of her son's death. She notes that she has never been really big on birthdays. She notes that she may contact her daughter about doing something for the two year anniversary of her son's death. She denies suicidal and homicidal thoughts.  Duration of problem: ; Severity of problem: moderate  OBJECTIVE: Mood: Euthymic and Affect: Appropriate Risk of harm to self or others: No plan to harm self or others  LIFE CONTEXT: Family and Social: see above. School/Work: see above. Self-Care: see above. Life Changes: see above.   GOALS ADDRESSED: Patient will: 1.  Reduce symptoms of: anxiety  2.  Increase knowledge and/or ability of: self-management skills  3.  Demonstrate ability to: Increase healthy adjustment to current life circumstances  INTERVENTIONS: Interventions utilized:  Supportive Counseling was utilized by the clinician during today's follow up session. Clinician processed with the patient regarding how she has been doing since the last follow up session. Clinician measured the patent's anxiety and depression on a numerical scale. Clinician explained to the patient that it sounds like although she is frustrated with having to make adjustments to her  physical therapy schedule that it sounds like she was able to work on transportation. Clinician explained that she has a feeling due to the pandemic and ramped up delta variant that transportation will continue to remain free for the time being.  Standardized Assessments completed: GAD-7 and PHQ 9 PHQ 9 8  GAD 7 12 ASSESSMENT: Patient currently experiencing see above.  Patient may benefit from see above.  PLAN: 1. Follow up with behavioral health clinician on :  2. Behavioral recommendations: see above.  3. Referral(s): Integrated [employer] (In Clinic) 4. "From scale of 1-10, how likely are you to follow plan?":   Courtney Forts, LCSW

## 2020-07-02 ENCOUNTER — Ambulatory Visit: Payer: Self-pay

## 2020-07-08 ENCOUNTER — Other Ambulatory Visit: Payer: Self-pay

## 2020-07-08 ENCOUNTER — Ambulatory Visit: Payer: Self-pay | Admitting: Gerontology

## 2020-07-08 ENCOUNTER — Ambulatory Visit: Payer: Self-pay

## 2020-07-08 VITALS — Wt 205.5 lb

## 2020-07-08 DIAGNOSIS — G8929 Other chronic pain: Secondary | ICD-10-CM

## 2020-07-08 DIAGNOSIS — E109 Type 1 diabetes mellitus without complications: Secondary | ICD-10-CM

## 2020-07-08 DIAGNOSIS — M25662 Stiffness of left knee, not elsewhere classified: Secondary | ICD-10-CM

## 2020-07-08 NOTE — Patient Instructions (Addendum)
Diabetes Basics  Diabetes (diabetes mellitus) is a long-term (chronic) disease. It occurs when the body does not properly use sugar (glucose) that is released from food after you eat. Diabetes may be caused by one or both of these problems:  Your pancreas does not make enough of a hormone called insulin.  Your body does not react in a normal way to insulin that it makes. Insulin lets sugars (glucose) go into cells in your body. This gives you energy. If you have diabetes, sugars cannot get into cells. This causes high blood sugar (hyperglycemia). Follow these instructions at home: How is diabetes treated? You may need to take insulin or other diabetes medicines daily to keep your blood sugar in balance. Take your diabetes medicines every day as told by your doctor. List your diabetes medicines here: Diabetes medicines  Name of medicine: ______________________________ ? Amount (dose): _______________ Time (a.m./p.m.): _______________ Notes: ___________________________________  Name of medicine: ______________________________ ? Amount (dose): _______________ Time (a.m./p.m.): _______________ Notes: ___________________________________  Name of medicine: ______________________________ ? Amount (dose): _______________ Time (a.m./p.m.): _______________ Notes: ___________________________________ If you use insulin, you will learn how to give yourself insulin by injection. You may need to adjust the amount based on the food that you eat. List the types of insulin you use here: Insulin  Insulin type: ______________________________ ? Amount (dose): _______________ Time (a.m./p.m.): _______________ Notes: ___________________________________  Insulin type: ______________________________ ? Amount (dose): _______________ Time (a.m./p.m.): _______________ Notes: ___________________________________  Insulin type: ______________________________ ? Amount (dose): _______________ Time (a.m./p.m.):  _______________ Notes: ___________________________________  Insulin type: ______________________________ ? Amount (dose): _______________ Time (a.m./p.m.): _______________ Notes: ___________________________________  Insulin type: ______________________________ ? Amount (dose): _______________ Time (a.m./p.m.): _______________ Notes: ___________________________________ How do I manage my blood sugar?  Check your blood sugar levels using a blood glucose monitor as directed by your doctor. Your doctor will set treatment goals for you. Generally, you should have these blood sugar levels:  Before meals (preprandial): 80-130 mg/dL (4.4-7.2 mmol/L).  After meals (postprandial): below 180 mg/dL (10 mmol/L).  A1c level: less than 7%. Write down the times that you will check your blood sugar levels: Blood sugar checks  Time: _______________ Notes: ___________________________________  Time: _______________ Notes: ___________________________________  Time: _______________ Notes: ___________________________________  Time: _______________ Notes: ___________________________________  Time: _______________ Notes: ___________________________________  Time: _______________ Notes: ___________________________________  What do I need to know about low blood sugar? Low blood sugar is called hypoglycemia. This is when blood sugar is at or below 70 mg/dL (3.9 mmol/L). Symptoms may include:  Feeling: ? Hungry. ? Worried or nervous (anxious). ? Sweaty and clammy. ? Confused. ? Dizzy. ? Sleepy. ? Sick to your stomach (nauseous).  Having: ? A fast heartbeat. ? A headache. ? A change in your vision. ? Tingling or no feeling (numbness) around the mouth, lips, or tongue. ? Jerky movements that you cannot control (seizure).  Having trouble with: ? Moving (coordination). ? Sleeping. ? Passing out (fainting). ? Getting upset easily (irritability). Treating low blood sugar To treat low blood  sugar, eat or drink something sugary right away. If you can think clearly and swallow safely, follow the 15:15 rule:  Take 15 grams of a fast-acting carb (carbohydrate). Talk with your doctor about how much you should take.  Some fast-acting carbs are: ? Sugar tablets (glucose pills). Take 3-4 glucose pills. ? 6-8 pieces of hard candy. ? 4-6 oz (120-150 mL) of fruit juice. ? 4-6 oz (120-150 mL) of regular (not diet) soda. ? 1 Tbsp (15 mL) honey or sugar.    Check your blood sugar 15 minutes after you take the carb.  If your blood sugar is still at or below 70 mg/dL (3.9 mmol/L), take 15 grams of a carb again.  If your blood sugar does not go above 70 mg/dL (3.9 mmol/L) after 3 tries, get help right away.  After your blood sugar goes back to normal, eat a meal or a snack within 1 hour. Treating very low blood sugar If your blood sugar is at or below 54 mg/dL (3 mmol/L), you have very low blood sugar (severe hypoglycemia). This is an emergency. Do not wait to see if the symptoms will go away. Get medical help right away. Call your local emergency services (911 in the [place] Do not drive yourself to the hospital. Questions to ask your health care provider  Do I need to meet with a diabetes educator?  What equipment will I need to care for myself at home?  What diabetes medicines do I need? When should I take them?  How often do I need to check my blood sugar?  What number can I call if I have questions?  When is my next doctor's visit?  Where can I find a support group for people with diabetes? Where to find more information  American Diabetes Association: www.diabetes.org  American Association of Diabetes Educators: www.diabeteseducator.org/patient-resources Contact a doctor if:  Your blood sugar is at or above 240 mg/dL (69.6 mmol/L) for 2 days in a row.  You have been sick or have had a fever for 2 days or more, and you are not getting better.  You have any of these  problems for more than 6 hours: ? You cannot eat or drink. ? You feel sick to your stomach (nauseous). ? You throw up (vomit). ? You have watery poop (diarrhea). Get help right away if:  Your blood sugar is lower than 54 mg/dL (3 mmol/L).  You get confused.  You have trouble: ? Thinking clearly. ? Breathing. Summary  Diabetes (diabetes mellitus) is a long-term (chronic) disease. It occurs when the body does not properly use sugar (glucose) that is released from food after digestion.  Take insulin and diabetes medicines as told.  Check your blood sugar every day, as often as told.  Keep all follow-up visits as told by your doctor. This is important. This information is not intended to replace advice given to you by your health care provider. Make sure you discuss any questions you have with your health care provider. Document Revised: 07/25/2019 Document Reviewed: 02/03/2018 Elsevier Patient Education  2020 [employer]  Preventing Diabetes Mellitus Complications You can take action to prevent or slow down problems that are caused by diabetes (diabetes mellitus). Following your diabetes plan and taking care of yourself can reduce your risk of serious or life-threatening complications. What actions can I take to prevent diabetes complications? Manage your diabetes   Follow instructions from your health care providers about managing your diabetes. Your diabetes may be managed by a team of health care providers who can teach you how to care for yourself and can answer questions that you have.  Educate yourself about your condition so you can make healthy choices about eating and physical activity.  Check your blood sugar (glucose) levels as often as directed. Your health care provider will help you decide how often to check your blood glucose level depending on your treatment goals and how well you are meeting them.  Ask your health care provider if you should take  low-dose  aspirin daily and what dose is recommended for you. Taking low-dose aspirin daily is recommended to help prevent cardiovascular disease. Do not use nicotine or tobacco Do not use any products that contain nicotine or tobacco, such as cigarettes and e-cigarettes. If you need help quitting, ask your health care provider. Nicotine raises your risk for diabetes problems. If you quit using nicotine:  You will lower your risk for heart attack, stroke, nerve disease, and kidney disease.  Your cholesterol and blood pressure may improve.  Your blood circulation will improve. Keep your blood pressure under control Your personal target blood pressure is determined based on:  Your age.  Your medicines.  How long you have had diabetes.  Any other medical conditions you have. To control your blood pressure:  Follow instructions from your health care provider about meal planning, exercise, and medicines.  Make sure your health care provider checks your blood pressure at every medical visit.  Monitor your blood pressure at home as told by your health care provider.  Keep your cholesterol under control To control your cholesterol:  Follow instructions from your health care provider about meal planning, exercise, and medicines.  Have your cholesterol checked at least once a year.  You may be prescribed medicine to lower cholesterol (statin). If you are not taking a statin, ask your health care provider if you should be. Controlling your cholesterol may:  Help prevent heart disease and stroke. These are the most common health problems for people with diabetes.  Improve your blood flow. Schedule and keep yearly physical exams and eye exams Your health care provider will tell you how often you need medical visits depending on your diabetes management plan. Keep all follow-up visits as directed. This is important so possible problems can be identified early and complications can be avoided or  treated.  Every visit with your health care provider should include measuring your: ? Weight. ? Blood pressure. ? Blood glucose control.  Your A1c (hemoglobin A1c) level should be checked: ? At least 2 times a year, if you are meeting your treatment goals. ? 4 times a year, if you are not meeting treatment goals or if your treatment goals have changed.  Your blood lipids (lipid profile) should be checked yearly. You should also be checked yearly for protein in your urine (urine microalbumin).  If you have type 1 diabetes, get an eye exam 3-5 years after you are diagnosed, and then once a year after your first exam.  If you have type 2 diabetes, get an eye exam as soon as you are diagnosed, and then once a year after your first exam. Keep your vaccines current It is recommended that you receive:  A flu (influenza) vaccine every year.  A pneumonia (pneumococcal) vaccine and a hepatitis B vaccine. If you are age 51 or older, you may get the pneumonia vaccine as a series of two separate shots. Ask your health care provider which other vaccines may be recommended. Take care of your feet Diabetes may cause you to have poor blood circulation to your legs and feet. Because of this, taking care of your feet is very important. Diabetes can cause:  The skin on the feet to get thinner, break more easily, and heal more slowly.  Nerve damage in your legs and feet, which results in decreased feeling. You may not notice minor injuries that could lead to serious problems. To avoid foot problems:  Check your skin and feet every day for cuts,  bruises, redness, blisters, or sores.  Schedule a foot exam with your health care provider once every year. This exam includes: ? Inspecting of the structure and skin of your feet. ? Checking the pulses and sensation in your feet.  Make sure that your health care provider performs a visual foot exam at every medical visit.  Take care of your teeth People  with poorly controlled diabetes are more likely to have gum (periodontal) disease. Diabetes can make periodontal diseases harder to control. If not treated, periodontal diseases can lead to tooth loss. To prevent this:  Brush your teeth twice a day.  Floss at least once a day.  Visit your dentist 2 times a year. Drink responsibly Limit alcohol intake to no more than 1 drink a day for nonpregnant women and 2 drinks a day for men. One drink equals 12 oz of beer, 5 oz of wine, or 1 oz of hard liquor.  It is important to eat food when you drink alcohol to avoid low blood glucose (hypoglycemia). Avoid alcohol if you:  Have a history of alcohol abuse or dependence.  Are pregnant.  Have liver disease, pancreatitis, advanced neuropathy, or severe hypertriglyceridemia. Lessen stress Living with diabetes can be stressful. When you are experiencing stress, your blood glucose may be affected in two ways:  Stress hormones may cause your blood glucose to rise.  You may be distracted from taking good care of yourself. Be aware of your stress level and make changes to help you manage challenging situations. To lower your stress levels:  Consider joining a support group.  Do planned relaxation or meditation.  Do a hobby that you enjoy.  Maintain healthy relationships.  Exercise regularly.  Work with your health care provider or a mental health professional. Summary  You can take action to prevent or slow down problems that are caused by diabetes (diabetes mellitus). Following your diabetes plan and taking care of yourself can reduce your risk of serious or life-threatening complications.  Follow instructions from your health care providers about managing your diabetes. Your diabetes may be managed by a team of health care providers who can teach you how to care for yourself and can answer questions that you have.  Your health care provider will tell you how often you need medical visits  depending on your diabetes management plan. Keep all follow-up visits as directed. This is important so possible problems can be identified early and complications can be avoided or treated. This information is not intended to replace advice given to you by your health care provider. Make sure you discuss any questions you have with your health care provider. Document Revised: 01/30/2018 Document Reviewed: 07/31/2016 Elsevier Patient Education  2020 [employer].  Exercising to Lose Weight Exercise is structured, repetitive physical activity to improve fitness and health. Getting regular exercise is important for everyone. It is especially important if you are overweight. Being overweight increases your risk of heart disease, stroke, diabetes, high blood pressure, and several types of cancer. Reducing your calorie intake and exercising can help you lose weight. Exercise is usually categorized as moderate or vigorous intensity. To lose weight, most people need to do a certain amount of moderate-intensity or vigorous-intensity exercise each week. Moderate-intensity exercise  Moderate-intensity exercise is any activity that gets you moving enough to burn at least three times more energy (calories) than if you were sitting. Examples of moderate exercise include:  Walking a mile in 15 minutes.  Doing light yard work.  Biking  at an easy pace. Most people should get at least 150 minutes (2 hours and 30 minutes) a week of moderate-intensity exercise to maintain their body weight. Vigorous-intensity exercise Vigorous-intensity exercise is any activity that gets you moving enough to burn at least six times more calories than if you were sitting. When you exercise at this intensity, you should be working hard enough that you are not able to carry on a conversation. Examples of vigorous exercise include:  Running.  Playing a team sport, such as football, basketball, and soccer.  Jumping rope. Most  people should get at least 75 minutes (1 hour and 15 minutes) a week of vigorous-intensity exercise to maintain their body weight. How can exercise affect me? When you exercise enough to burn more calories than you eat, you lose weight. Exercise also reduces body fat and builds muscle. The more muscle you have, the more calories you burn. Exercise also:  Improves mood.  Reduces stress and tension.  Improves your overall fitness, flexibility, and endurance.  Increases bone strength. The amount of exercise you need to lose weight depends on:  Your age.  The type of exercise.  Any health conditions you have.  Your overall physical ability. Talk to your health care provider about how much exercise you need and what types of activities are safe for you. What actions can I take to lose weight? Nutrition   Make changes to your diet as told by your health care provider or diet and nutrition specialist (dietitian). This may include: ? Eating fewer calories. ? Eating more protein. ? Eating less unhealthy fats. ? Eating a diet that includes fresh fruits and vegetables, whole grains, low-fat dairy products, and lean protein. ? Avoiding foods with added fat, salt, and sugar.  Drink plenty of water while you exercise to prevent dehydration or heat stroke. Activity  Choose an activity that you enjoy and set realistic goals. Your health care provider can help you make an exercise plan that works for you.  Exercise at a moderate or vigorous intensity most days of the week. ? The intensity of exercise may vary from person to person. You can tell how intense a workout is for you by paying attention to your breathing and heartbeat. Most people will notice their breathing and heartbeat get faster with more intense exercise.  Do resistance training twice each week, such as: ? Push-ups. ? Sit-ups. ? Lifting weights. ? Using resistance bands.  Getting short amounts of exercise can be just as  helpful as long structured periods of exercise. If you have trouble finding time to exercise, try to include exercise in your daily routine. ? Get up, stretch, and walk around every 30 minutes throughout the day. ? Go for a walk during your lunch break. ? Park your car farther away from your destination. ? If you take public transportation, get off one stop early and walk the rest of the way. ? Make phone calls while standing up and walking around. ? Take the stairs instead of elevators or escalators.  Wear comfortable clothes and shoes with good support.  Do not exercise so much that you hurt yourself, feel dizzy, or get very short of breath. Where to find more information  [place]. Department of Health and Human Services: ThisPath.fi  Centers for Disease Control and Prevention (CDC): FootballExhibition.com.br Contact a health care provider:  Before starting a new exercise program.  If you have questions or concerns about your weight.  If you have a medical problem that  keeps you from exercising. Get help right away if you have any of the following while exercising:  Injury.  Dizziness.  Difficulty breathing or shortness of breath that does not go away when you stop exercising.  Chest pain.  Rapid heartbeat. Summary  Being overweight increases your risk of heart disease, stroke, diabetes, high blood pressure, and several types of cancer.  Losing weight happens when you burn more calories than you eat.  Reducing the amount of calories you eat in addition to getting regular moderate or vigorous exercise each week helps you lose weight. This information is not intended to replace advice given to you by your health care provider. Make sure you discuss any questions you have with your health care provider. Document Revised: 11/14/2017 Document Reviewed: 11/14/2017 Elsevier Patient Education  2020 [employer].

## 2020-07-08 NOTE — Progress Notes (Signed)
[facility]   Progress Note: General Provider: Wolfgang Phoenix, NP  SUBJECTIVE:   Courtney Stafford is a 49 y.o. female who  has a past medical history of Diabetes mellitus without complication ([place]) and Hypertension.. Patient presents today for type I diabetes follow- up. HgbA1c done on 04/16/2020 was 8.4%.  She states that she has been compliant with her medications and checking blood glucose 3-4 times a day. She states that she forgot to bring her log book. Looking at her glucometer history her fasting blood glucose ranges between 186 to 230 with one fasting 66 mg/dl. Her average pre-prandial is 192 mg/dl and her post-prandial is 248 mg/dl-341 mg/dl. She states that since this morning she feels weak/tired and felt like her blood glucose was low. She states that her fasting blood glucose was 83 mg/dl and she ate breakfast, but didn't take Novolog. Her blood glucose at the clinic was 144 mg/dl. She states that she eats low carb/a low concentrated sweets diet, and does not excercise. She reports polydipsia, and polyuria. She states that she takes Novolog 4 units with lunch and breakfast and 2 units with supper instead of taking 5-7 units with meals and adjusting with sliding scale as directed by endocrinology. Overall, she states that she's doing well and offers no further complaints.  Review of Systems  Constitutional: Negative.   Eyes: Negative.   Respiratory: Negative.   Cardiovascular: Negative.   Gastrointestinal: Negative.   Skin: Negative.   Neurological: Negative.   Endo/Heme/Allergies: Positive for polydipsia. Environmental allergies: and polyuria.  Psychiatric/Behavioral: Negative.      OBJECTIVE: Wt 205 lb 8 oz (93.2 kg)   LMP 01/03/2016 (Within Years)   BMI 40.13 kg/m   Wt Readings from Last 3 Encounters:  07/08/20 205 lb 8 oz (93.2 kg)  05/21/20 208 lb 8 oz (94.6 kg)  04/23/20 201 lb (91.2 kg)    She lost 3 pounds in 6 weeks, was encouraged to continue on  her weight loss regimen. Physical Exam Constitutional:      Appearance: Normal appearance.  Cardiovascular:     Rate and Rhythm: Normal rate and regular rhythm.  Skin:    General: Skin is warm and dry.  Neurological:     Mental Status: She is alert and oriented to person, place, and time.     Motor: Weakness (general) present.  Psychiatric:        Mood and Affect: Mood normal.        Behavior: Behavior normal.     ASSESSMENT/PLAN: 1. Type 1 diabetes mellitus without complication ([place])  She will resume current treatment regime, advise to check blood glucose fasting and before administering insulin and to bring blood glucose log to the next appointment. Fasting blood glucose should be between 80 mg/dl to 153m/dl She is advised to continue on low carb/a low concentrated sweets diet and exercise as tolerated. She is advise to do daily foot checks Continue Lantus 30 Units into the skin at bedtime, and Novolog 5-7 units with meals and adjust with sliding scale as prescribed by Endocrinology. Will check Cmet, Vit D, B12 and TSH to rule in/out fatigue. - Comp Met (CMET) - Comp Met (CMET) - Vitamin D (25 hydroxy) - B12 - TSH  Return in about 2 weeks (around 07/22/2020), or if symptoms worsen or fail to improve.    The patient was given clear instructions to go to ER or return to medical center if symptoms do not improve, worsen or new  problems develop. The patient verbalized understanding and agreed with plan of care.  Courtney Stafford, [place]

## 2020-07-08 NOTE — Therapy (Signed)
[facility] [address] [place], [address] Phone: [phone]   Fax:  [phone]  Physical Therapy Treatment  Patient Details  Name: Courtney Stafford MRN: [id number] Date of Birth: 1971-07-17 Referring Provider (PT): Daymon Larsen, NP   Encounter Date: 07/08/2020   PT End of Session - 07/08/20 1439    Visit Number 6    Number of Visits 16    Date for PT Re-Evaluation 08/11/20    Authorization Time Period 04/30/20-06/25/20; 06/30/20-08/11/20    PT Start Time 1435    PT Stop Time 1515    PT Time Calculation (min) 40 min    Activity Tolerance Patient tolerated treatment well;No increased pain    Behavior During Therapy WFL for tasks assessed/performed           Past Medical History:  Diagnosis Date  . Diabetes mellitus without complication (HCC)   . Hypertension     Past Surgical History:  Procedure Laterality Date  . CARPAL TUNNEL RELEASE  6 years ago   both hands    There were no vitals filed for this visit.   Subjective Assessment - 07/08/20 1436    Subjective Patient reports doing "ok" today and went to the doctor this morning due to low blood sugar.    Pertinent History Pt presents to OPPT for ongoing progressive Left knee pain. Pt was seen by Allie Bossier in May 2021, who diagnosed knee OA questionable need for TKA in future, has recommended conservative management at this point. Pt had a cortizone injection in May (without avail) and is shceduled for a 'gel' injection on 6/28.    Limitations Walking;Lifting;Standing    How long can you sit comfortably? <30 minutes    How long can you stand comfortably? ~10 minutes    How long can you walk comfortably? ~roughly short community distances    Diagnostic tests [place], Xray (unremarkable); MRI with "bone on bone" action going on    Patient Stated Goals Be able to tolerate walking better, teach independent means to manage symptoms    Currently in  Pain? Yes    Pain Score 4     Pain Location Knee          INTERVENTION   Therapeutic Exercise  -AA/ROM Nustep c BUE assist x5 minutes, Seat 4, Arms 8  -Total gym (Level 22) x15  -seated LLE LAQ 2x15 #3 AW -standing bilat heel raise 2x15, BUE supported -Step-up 2x8 bilat 6 inch step  -Hamstring Curl Omega 2x15 20lbs    Performed Exercises to improve strength and mobility of LE's   PT Education - 07/08/20 1439    Education Details form/technique with exercise    Person(s) Educated Patient    Methods Explanation;Demonstration    Comprehension Verbalized understanding;Returned demonstration            PT Short Term Goals - 06/30/20 1129      PT SHORT TERM GOAL #1   Title Patient will be able to tolerate walking a distance of >531ft without having to take a rest break.    Baseline ~225 ft at Eval; 8/16: 546ft in 4'54" (heavy lateral sway over an abducted LLE)    Time 4    Period Weeks    Status On-going    Target Date 07/28/20             PT Long Term Goals - 06/30/20 1134      PT LONG TERM GOAL #1  Title Patient will demostrate an increase in activity tolerance by being able to ambulate a distance of >1081ft without rest breaks.    Baseline ~253ft at Eval; 8/16: 558ft    Time 8    Period Weeks    Status On-going    Target Date 08/25/20      PT LONG TERM GOAL #2   Title Patient will have a FOTO score of >56/100 to show an increase in independence while performing functional daily activities.    Baseline 41/100 at Eval; 06/30/20: 36/100    Time 8    Period Weeks    Status On-going    Target Date 08/25/20      PT LONG TERM GOAL #3   Title Patient will demonstrate improved facility and steadiness in ambulation while performing at a gait speed of >0.66m/s.    Baseline Self Selected gait speed <0.4; 06/30/20: 0.62m/s    Time 8    Period Weeks    Status On-going    Target Date 08/25/20                 Plan - 07/08/20 1439    Clinical  Impression Statement Focused on strengthening and improving mobility of patient's knee during todays session.  Patient's knee pain decreased after performing NuStep which allowed patient to perform exercises throughout session.  Patient is making improvements in strength and mobility, but pain continues to be limiting her daily and leisure activities.  Patient will benefit from further skilled therapy to return to prior level of function.    Personal Factors and Comorbidities Age;Behavior Pattern;Fitness;Transportation;Time since onset of injury/illness/exacerbation    Examination-Activity Limitations Toileting;Dressing;Sit;Transfers;Bed Mobility;Lift;Bend;Squat;Locomotion Level;Stairs;Carry;Stand    Examination-Participation Restrictions Yard Work;Community Activity;Shop    Stability/Clinical Decision Making Stable/Uncomplicated    Clinical Decision Making Low    Rehab Potential Good    PT Frequency 2x / week    PT Duration 8 weeks    PT Treatment/Interventions ADLs/Self Care Home Management;Cryotherapy;Electrical Stimulation;Moist Heat;DME Instruction;Gait training;Stair training;Functional mobility training;Therapeutic activities;Therapeutic exercise;Balance training;Neuromuscular re-education;Patient/family education;Manual techniques;Passive range of motion;Dry needling;Taping;Energy conservation;Ultrasound    PT Next Visit Plan Progress strengthening and ROM    PT Home Exercise Plan Hooklying and Seated Heel Slides and SAQ    Consulted and Agree with Plan of Care Patient           Patient will benefit from skilled therapeutic intervention in order to improve the following deficits and impairments:  Decreased endurance, Decreased mobility, Abnormal gait, Difficulty walking, Decreased range of motion, Obesity, Decreased activity tolerance, Decreased knowledge of use of DME, Decreased strength, Postural dysfunction, Pain, Improper body mechanics, Hypomobility  Visit Diagnosis: Chronic pain of  left knee  Stiffness of left knee, not elsewhere classified     Problem List Patient Active Problem List   Diagnosis Date Noted  . Urinary frequency 05/21/2020  . Unilateral primary osteoarthritis, left knee 03/17/2020  . Left knee pain 12/11/2019  . Swelling of lower leg 12/04/2019  . Pain and swelling of lower leg, left 12/04/2019  . Insomnia 08/28/2019  . 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 HTN (hypertension) 10/22/2015  . Type 1 diabetes (HCC) 10/22/2015  . Anemia 10/22/2015  . Restless legs 10/22/2015  . Hematuria 04/09/2015   3:15 PM, 07/08/20 Luanna Salk, SPT Student Physical Therapist [place]  [phone]  Luanna Salk 07/08/2020, 2:55 PM  [facility] PHYSICAL AND SPORTS MEDICINE [address], [place], [address]  Phone: [phone]   Fax:  [phone]  Name: OMEKA HOLBEN MRN: [id number] Date of Birth: 01-25-1971

## 2020-07-10 ENCOUNTER — Other Ambulatory Visit: Payer: Self-pay

## 2020-07-10 ENCOUNTER — Ambulatory Visit: Payer: Self-pay

## 2020-07-10 DIAGNOSIS — M25662 Stiffness of left knee, not elsewhere classified: Secondary | ICD-10-CM

## 2020-07-10 DIAGNOSIS — G8929 Other chronic pain: Secondary | ICD-10-CM

## 2020-07-10 NOTE — Therapy (Signed)
[facility] [address] [place], [address] Phone: [phone]   Fax:  [phone]  Physical Therapy Treatment  Patient Details  Name: Courtney Stafford MRN: [id number] Date of Birth: [birth year] Referring Provider (PT): Daymon Larsen, NP   Encounter Date: 07/10/2020   PT End of Session - 07/10/20 1433    Visit Number 7    Number of Visits 16    Date for PT Re-Evaluation 08/11/20    Authorization Time Period 04/30/20-06/25/20; 06/30/20-08/11/20    PT Start Time 1430    PT Stop Time 1515    PT Time Calculation (min) 45 min    Activity Tolerance Patient tolerated treatment well;No increased pain    Behavior During Therapy WFL for tasks assessed/performed           Past Medical History:  Diagnosis Date   Diabetes mellitus without complication (HCC)    Hypertension     Past Surgical History:  Procedure Laterality Date   CARPAL TUNNEL RELEASE  6 years ago   both hands    There were no vitals filed for this visit.   Subjective Assessment - 07/10/20 1432    Subjective Patient reports that she had soreness in her legs after last session.    Pertinent History Pt presents to OPPT for ongoing progressive Left knee pain. Pt was seen by Allie Bossier in May 2021, who diagnosed knee OA questionable need for TKA in future, has recommended conservative management at this point. Pt had a cortizone injection in May (without avail) and is shceduled for a 'gel' injection on 6/28.    Limitations Walking;Lifting;Standing    How long can you sit comfortably? <30 minutes    How long can you stand comfortably? ~10 minutes    How long can you walk comfortably? ~roughly short community distances    Diagnostic tests [place], Xray (unremarkable); MRI with "bone on bone" action going on    Patient Stated Goals Be able to tolerate walking better, teach independent means to manage symptoms    Currently in Pain? Yes    Pain  Score 5            INTERVENTION    Therapeutic Exercise  -AA/ROM Nustep c BUE assist x5 minutes, Seat 4, Arms 8  -Total gym (Level 23) 3x15  -Gait training with 6 hurdles x5 down and back  -Standing Hip Abduction 2x15 2#, BUE supported -Standing Hip Extension 2x15 2#, BUE supported -Knee Extension Omega 2x15 10lbs    Performed Exercises to improve strength and mobility of LE's    PT Education - 07/10/20 1432    Education Details form/technique with exercise    Person(s) Educated Patient    Methods Explanation;Demonstration    Comprehension Verbalized understanding;Returned demonstration            PT Short Term Goals - 06/30/20 1129      PT SHORT TERM GOAL #1   Title Patient will be able to tolerate walking a distance of >513ft without having to take a rest break.    Baseline ~225 ft at Eval; 8/16: 559ft in 4'54" (heavy lateral sway over an abducted LLE)    Time 4    Period Weeks    Status On-going    Target Date 07/28/20             PT Long Term Goals - 06/30/20 1134      PT LONG TERM GOAL #1   Title Patient  will demostrate an increase in activity tolerance by being able to ambulate a distance of >1071ft without rest breaks.    Baseline ~277ft at Eval; 8/16: 542ft    Time 8    Period Weeks    Status On-going    Target Date 08/25/20      PT LONG TERM GOAL #2   Title Patient will have a FOTO score of >56/100 to show an increase in independence while performing functional daily activities.    Baseline 41/100 at Eval; 06/30/20: 36/100    Time 8    Period Weeks    Status On-going    Target Date 08/25/20      PT LONG TERM GOAL #3   Title Patient will demonstrate improved facility and steadiness in ambulation while performing at a gait speed of >0.74m/s.    Baseline Self Selected gait speed <0.4; 06/30/20: 0.48m/s    Time 8    Period Weeks    Status On-going    Target Date 08/25/20                 Plan - 07/10/20 1509    Clinical Impression  Statement Began working on patients gait during todays session along with strengthening LEs to address deficits affecting patients knee.  Patient responded well to treatment with no increase in symptoms and required verbal cues to ensure correct form while performing exercises. Patient continues to have mild knee pain during walking, and this was demonstrated during hurdle exercise. Also when stepping with right foot over hurdles patient demonstrates a reverse Trendelenburg indicating weak hip musculature on LE.    Personal Factors and Comorbidities Age;Behavior Pattern;Fitness;Transportation;Time since onset of injury/illness/exacerbation    Examination-Activity Limitations Toileting;Dressing;Sit;Transfers;Bed Mobility;Lift;Bend;Squat;Locomotion Level;Stairs;Carry;Stand    Examination-Participation Restrictions Yard Work;Community Activity;Shop    Stability/Clinical Decision Making Stable/Uncomplicated    Clinical Decision Making Low    Rehab Potential Good    PT Frequency 2x / week    PT Duration 8 weeks    PT Treatment/Interventions ADLs/Self Care Home Management;Cryotherapy;Electrical Stimulation;Moist Heat;DME Instruction;Gait training;Stair training;Functional mobility training;Therapeutic activities;Therapeutic exercise;Balance training;Neuromuscular re-education;Patient/family education;Manual techniques;Passive range of motion;Dry needling;Taping;Energy conservation;Ultrasound    PT Next Visit Plan Progress strengthening and ROM    PT Home Exercise Plan Hooklying and Seated Heel Slides and SAQ    Consulted and Agree with Plan of Care Patient           Patient will benefit from skilled therapeutic intervention in order to improve the following deficits and impairments:  Decreased endurance, Decreased mobility, Abnormal gait, Difficulty walking, Decreased range of motion, Obesity, Decreased activity tolerance, Decreased knowledge of use of DME, Decreased strength, Postural dysfunction,  Pain, Improper body mechanics, Hypomobility  Visit Diagnosis: Chronic pain of left knee  Stiffness of left knee, not elsewhere classified     Problem List Patient Active Problem List   Diagnosis Date Noted   Urinary frequency 05/21/2020   Unilateral primary osteoarthritis, left knee 03/17/2020   Left knee pain 12/11/2019   Swelling of lower leg 12/04/2019   Pain and swelling of lower leg, left 12/04/2019   Insomnia 08/28/2019   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, [place], [address] Phone: [phone]   Fax:  [phone]  Name: Courtney Stafford MRN: [id number] Date of Birth: February 16, 1971

## 2020-07-16 ENCOUNTER — Other Ambulatory Visit: Payer: Self-pay

## 2020-07-16 DIAGNOSIS — E109 Type 1 diabetes mellitus without complications: Secondary | ICD-10-CM

## 2020-07-16 DIAGNOSIS — R350 Frequency of micturition: Secondary | ICD-10-CM

## 2020-07-17 LAB — UA/M W/RFLX CULTURE, ROUTINE
Bilirubin, UA: NEGATIVE
Leukocytes,UA: NEGATIVE
Nitrite, UA: NEGATIVE
Protein,UA: NEGATIVE
RBC, UA: NEGATIVE
Specific Gravity, UA: 1.015 (ref 1.005–1.030)
Urobilinogen, Ur: 0.2 mg/dL (ref 0.2–1.0)
pH, UA: 6 (ref 5.0–7.5)

## 2020-07-17 LAB — MICROSCOPIC EXAMINATION
Casts: NONE SEEN /lpf
RBC, Urine: NONE SEEN /hpf (ref 0–2)

## 2020-07-17 LAB — HEMOGLOBIN A1C
Est. average glucose Bld gHb Est-mCnc: 174 mg/dL
Hgb A1c MFr Bld: 7.7 % — ABNORMAL HIGH (ref 4.8–5.6)

## 2020-07-22 ENCOUNTER — Other Ambulatory Visit: Payer: Self-pay

## 2020-07-22 ENCOUNTER — Ambulatory Visit: Payer: Self-pay | Admitting: Licensed Clinical Social Worker

## 2020-07-22 DIAGNOSIS — F411 Generalized anxiety disorder: Secondary | ICD-10-CM

## 2020-07-22 NOTE — BH Specialist Note (Signed)
Integrated Behavioral Health Follow Up Visit  MRN: [id number] Name: Courtney Stafford    Type of Service: Integrated Behavioral Health- Individual/Family Interpretor:No. Interpretor Name and Language: none  SUBJECTIVE: Courtney Stafford is a 49 y.o. female accompanied by self  Patient was referred by Hurman Horn, NP  for mental health Patient reports the following symptoms/concerns: The patient reports she is still having a lot of problems with her knees and sometimes can hardly walk. Patient states she hasn't had physical therapy in two weeks but she is doing her exercises at home. However, she says it is not helping the joint stiffness or the tightness and pain. Patient notes she is not getting out of house other than to come to her doctor appointments. She states she can't walk to [facility] or the store as much as she use to. Patient reports sitting on the porch for a little while every other day. She shared that she has no family and has not spoke to her mother in 15-20 years and has only a handful of friends who she no longer sees regularly. Patient says she is lonely and feels isolated. Patient states she feels the medicine is not helping her sleep and her knee pain is contributing to the issue. She reports getting three to four hours of sleep per night. She doesn't feel like eating and that it is hard to cook with her knee pain. The patient also shared that she is thinking more about her son who passed away two years ago. The patient has thoughts of suicide several times per week, but denies a plan. Patient states she does not have a firearm in the home or access to one. Duration of the problem  ; Severity of problem: moderate  OBJECTIVE: Mood: Depressed and Affect: Appropriate Risk of harm to self or others: Suicidal ideation. Patient denies having a plan, and denies access to firearms.   LIFE CONTEXT: Family and Social: See above School/Work: see above Self-Care: see above Life  Changes: see above  GOALS ADDRESSED: Patient will: 1.  Reduce symptoms of: anxiety, depression and stress  2.  Increase knowledge and/or ability of: coping skills and healthy habits  3.  Demonstrate ability to: Increase healthy adjustment to current life circumstances and Increase adequate support systems for patient/family  INTERVENTIONS: Interventions utilized:  Clinician used reflective listening to allow safe space for the patient to vent her experiences regarding her loniness, isolation, and physical pain. The clinician offered supportive counseling offering praise for contiuning her physical therapy exercises at home and working to improve her health.  The clinician suggested that the patient attend a support group to assist her with the grief over the loss of her son. Further, the clinician offered to help locate a support group and explore transportation options for the patient to consider at the next session. The clinician encouraged the patient to increase her support system and to discussed setting this as a goal to work towards. Clinician informed the patient of how to best reach them through the [facility] main line, and that she was always able to go to the [facility] walk- in or local emergency room if a mental health crisis arose. Clinician measured the patients symptoms on a numerical scale.  Standardized Assessments completed: GAD-7 and PHQ 9  PHQ 9 13 Gad 7 16  ASSESSMENT: Patient currently experiencing see above.   Patient may benefit from above.  PLAN: 1. Follow up with behavioral health clinician on : 08/06/2020 at 10:00  am  2. Behavioral recommendations:  3. Referral(s): Integrated [employer] (In Clinic) 4. "From scale of 1-10, how likely are you to follow plan?":  Judith Part, Student-Social Work

## 2020-07-23 ENCOUNTER — Other Ambulatory Visit: Payer: Self-pay

## 2020-07-23 ENCOUNTER — Ambulatory Visit: Payer: Self-pay | Admitting: Gerontology

## 2020-07-23 ENCOUNTER — Encounter: Payer: Self-pay | Admitting: Gerontology

## 2020-07-23 VITALS — BP 140/90 | HR 83 | Ht 60.0 in | Wt 208.0 lb

## 2020-07-23 DIAGNOSIS — I10 Essential (primary) hypertension: Secondary | ICD-10-CM

## 2020-07-23 DIAGNOSIS — M25562 Pain in left knee: Secondary | ICD-10-CM

## 2020-07-23 DIAGNOSIS — E109 Type 1 diabetes mellitus without complications: Secondary | ICD-10-CM

## 2020-07-23 MED ORDER — MELOXICAM 7.5 MG PO TABS: 7.5000 mg | ORAL_TABLET | Freq: Every day | ORAL | 0 refills | Status: DC

## 2020-07-23 MED ORDER — HYDROCHLOROTHIAZIDE 25 MG PO TABS: 25.0000 mg | ORAL_TABLET | Freq: Every day | ORAL | 1 refills | Status: DC

## 2020-07-23 MED ORDER — AMLODIPINE BESYLATE 10 MG PO TABS: 10.0000 mg | ORAL_TABLET | Freq: Every day | ORAL | 0 refills | Status: DC

## 2020-07-23 MED ORDER — LISINOPRIL 40 MG PO TABS: 40.0000 mg | ORAL_TABLET | Freq: Every day | ORAL | 0 refills | Status: DC

## 2020-07-23 MED ORDER — CARVEDILOL 12.5 MG PO TABS: 12.5000 mg | ORAL_TABLET | Freq: Two times a day (BID) | ORAL | 3 refills | Status: DC

## 2020-07-23 NOTE — Progress Notes (Signed)
Established Patient Office Visit  Subjective:  Patient ID: Courtney Stafford, female    DOB: 1971/10/25  Age: 49 y.o. MRN: [id number]  CC:  Chief Complaint  Patient presents with  . Diabetes  . Knee Pain    left    HPI Courtney Stafford presents for follow up of Type 1 diabetes, left knee pain and medication refill. Her HgbA1c done on 07/16/2020 decreased from 8.4% to  7.7%. She checks her blood glucose tid, her fasting blood glucose check is usually between 11am - 1 pm, and it ranges between 81 mg/dl to 371 mg/dl. Her pre dinner reading ranges between 66 mg/dl- 696 mg/dl and and 10 pm readings ranges between 91 mg/dl to 789 mg/dl. Today's fasting blood glucose reading was 153 mg/dl. She denies hypoglycemic symptoms and occasional polyuria and polyphagia, and she performs daily foot checks and will start walking more. Her blood pressure was elevated, she reports checking it weekly and it fluctuates, it was 140/90 when rechecked manually during visit. She reports that her left knee pain has improved 50%, but she continues to experience stiffness when she wakes up in the morning, and after sitting down for a while, but taking 7.5 mg Meloxicam moderately improves symptoms and she continues with Physical Therapy. Overall, she states that she's doing well and offers no further complaint.  Past Medical History:  Diagnosis Date  . Diabetes mellitus without complication (HCC)   . Hypertension     Past Surgical History:  Procedure Laterality Date  . CARPAL TUNNEL RELEASE  6 years ago   both hands    Family History  Problem Relation Age of Onset  . Cancer Father   . Breast cancer Neg Hx     Social History   Socioeconomic History  . Marital status: Single    Spouse name: Not on file  . Number of children: 2  . Years of education: Not on file  . Highest education level: High school graduate  Occupational History  . Occupation: unemployed  Tobacco Use  . Smoking status: Current Some Day  Smoker    Packs/day: 0.01    Types: Cigarettes  . Smokeless tobacco: Former Neurosurgeon  . Tobacco comment: cutting back  Vaping Use  . Vaping Use: Never used  Substance and Sexual Activity  . Alcohol use: No  . Drug use: No  . Sexual activity: Not on file  Other Topics Concern  . Not on file  Social History Narrative   Completed biopsychosocial assessment, social determinants screening, administered PHQ 9, and GAD 7.       Social determinants screening completed 01/31/2020. HS She receives bus passes [facility] for transportation. She receives food stamps 192 per month and utilizes a Programme researcher, broadcasting/film/video when needed. [employer] pays for housing and utilities.  She follows up with therapist at clinic for stress, anxiety, and depression. HS      Support system is small.   Social Determinants of Health   Financial Resource Strain: Low Risk   . Difficulty of Paying Living Expenses: Not very hard  Food Insecurity: No Food Insecurity  . Worried About Programme researcher, broadcasting/film/video in the Last Year: Never true  . Ran Out of Food in the Last Year: Never true  Transportation Needs: Unmet Transportation Needs  . Lack of Transportation (Medical): Yes  . Lack of Transportation (Non-Medical): No  Physical Activity: Insufficiently Active  . Days of Exercise per Week: 3 days  . Minutes of Exercise  per Session: 40 min  Stress: Stress Concern Present  . Feeling of Stress : Rather much  Social Connections: Socially Isolated  . Frequency of Communication with Friends and Family: Never  . Frequency of Social Gatherings with Friends and Family: Never  . Attends Religious Services: Never  . Active Member of Clubs or Organizations: No  . Attends BankerClub or Organization Meetings: Never  . Marital Status: Never married  Intimate Partner Violence: Not At Risk  . Fear of Current or Ex-Partner: No  . Emotionally Abused: No  . Physically Abused: No  . Sexually Abused: No    Outpatient Medications Prior to  Visit  Medication Sig Dispense Refill  . atorvastatin (LIPITOR) 10 MG tablet Take 1 tablet (10 mg total) by mouth daily. 90 tablet 1  . Blood Glucose Monitoring Suppl Supplies MISC 1 each by Does not apply route 5 (five) times daily. 500 each 4  . DULoxetine (CYMBALTA) 60 MG capsule Take 1 capsule (60 mg total) by mouth every morning. 30 capsule 2  . gabapentin (NEURONTIN) 300 MG capsule TAKE 2 CAPSULES (600MG ) BY MOUTH 2 TIMES A DAY AND 3 CAPSULES (900MG )AT NIGHT 210 capsule 0  . hydrochlorothiazide (HYDRODIURIL) 25 MG tablet Take 1 tablet (25 mg total) by mouth daily. 90 tablet 1  . insulin aspart (NOVOLOG FLEXPEN) 100 UNIT/ML FlexPen She states taking 5-7 units with meals and adjust with sliding scale as prescribed by Endocrinology.. 75 mL 3  . insulin glargine (LANTUS) 100 UNIT/ML Solostar Pen Inject 30 Units into the skin at bedtime. 30 mL 4  . Insulin Pen Needle (BD PEN NEEDLE NANO U/F) 32G X 4 MM MISC 5 times daily 200 each 12  . amLODipine (NORVASC) 10 MG tablet Take 1 tablet (10 mg total) by mouth daily. 90 tablet 0  . carvedilol (COREG) 12.5 MG tablet Take 1 tablet (12.5 mg total) by mouth 2 (two) times daily with a meal. 60 tablet 3  . lisinopril (ZESTRIL) 40 MG tablet Take 1 tablet (40 mg total) by mouth daily. 90 tablet 0  . meloxicam (MOBIC) 7.5 MG tablet Take 1 tablet (7.5 mg total) by mouth daily. 30 tablet 0  . mirtazapine (REMERON) 30 MG tablet Take 1 tablet (30 mg total) by mouth at bedtime. 30 tablet 2  . vitamin B-12 (CYANOCOBALAMIN) 500 MCG tablet Take 1 tablet (500 mcg total) by mouth daily. (Patient not taking: Reported on 07/23/2020) 30 tablet 3   No facility-administered medications prior to visit.    Allergies  Allergen Reactions  . Phenergan [Promethazine] Hives    ROS Review of Systems  Constitutional: Negative.   Respiratory: Negative.   Cardiovascular: Negative.   Endocrine: Negative.   Musculoskeletal: Positive for arthralgias (left knee pain).   Neurological: Negative.   Psychiatric/Behavioral: Negative.       Objective:    Physical Exam HENT:     Head: Normocephalic and atraumatic.  Cardiovascular:     Rate and Rhythm: Normal rate and regular rhythm.     Pulses: Normal pulses.     Heart sounds: Normal heart sounds.  Pulmonary:     Effort: Pulmonary effort is normal.     Breath sounds: Normal breath sounds.  Musculoskeletal:        General: Normal range of motion.  Skin:    General: Skin is warm and dry.  Neurological:     General: No focal deficit present.     Mental Status: She is alert and oriented to person, place, and time.  Mental status is at baseline.  Psychiatric:        Mood and Affect: Mood normal.        Behavior: Behavior normal.        Thought Content: Thought content normal.        Judgment: Judgment normal.     BP 140/90 (BP Location: Left Arm, Patient Position: Sitting, Cuff Size: Large)   Pulse 83   Ht 5' (1.524 m)   Wt 208 lb (94.3 kg)   LMP 01/03/2016 (Within Years)   SpO2 96%   BMI 40.62 kg/m  Wt Readings from Last 3 Encounters:  07/23/20 208 lb (94.3 kg)  07/08/20 205 lb 8 oz (93.2 kg)  05/21/20 208 lb 8 oz (94.6 kg)   She was encouraged to continue on her exercise regimen.  Health Maintenance Due  Topic Date Due  . Hepatitis C Screening  Never done  . COVID-19 Vaccine (1) Never done  . HIV Screening  Never done  . TETANUS/TDAP  Never done  . OPHTHALMOLOGY EXAM  03/23/2019  . COLONOSCOPY  Never done  . INFLUENZA VACCINE  06/15/2020    There are no preventive care reminders to display for this patient.  Lab Results  Component Value Date   TSH 0.693 06/27/2019   Lab Results  Component Value Date   WBC 9.9 06/27/2019   HGB 15.8 06/27/2019   HCT 49.3 (H) 06/27/2019   MCV 91 06/27/2019   PLT 340 06/27/2019   Lab Results  Component Value Date   NA 138 10/10/2019   K 3.7 10/10/2019   CO2 25 10/10/2019   GLUCOSE 309 (H) 10/10/2019   BUN 11 10/10/2019   CREATININE  0.89 10/10/2019   BILITOT 0.5 06/27/2019   ALKPHOS 114 06/27/2019   AST 13 06/27/2019   ALT 11 06/27/2019   PROT 6.8 06/27/2019   ALBUMIN 4.2 06/27/2019   CALCIUM 9.2 10/10/2019   ANIONGAP 9 02/12/2014   Lab Results  Component Value Date   CHOL 151 06/27/2019   Lab Results  Component Value Date   HDL 60 06/27/2019   Lab Results  Component Value Date   LDLCALC 68 06/27/2019   Lab Results  Component Value Date   TRIG 115 06/27/2019   Lab Results  Component Value Date   CHOLHDL 2.5 06/27/2019   Lab Results  Component Value Date   HGBA1C 7.7 (H) 07/16/2020      Assessment & Plan:     1. Essential hypertension - Her blood pressure is not under control, she will continue on current treatment regimen, check blood pressure every other day, record and bring log to follow up appointment. She was encuraged to continue on DASH diet and exercise as tolerated. - amLODipine (NORVASC) 10 MG tablet; Take 1 tablet (10 mg total) by mouth daily.  Dispense: 90 tablet; Refill: 0 - carvedilol (COREG) 12.5 MG tablet; Take 1 tablet (12.5 mg total) by mouth 2 (two) times daily with a meal.  Dispense: 60 tablet; Refill: 3 - lisinopril (ZESTRIL) 40 MG tablet; Take 1 tablet (40 mg total) by mouth daily.  Dispense: 90 tablet; Refill: 0  2. Chronic pain of left knee - She will continue on current treatment regimen and follow up with Physical therapy. - meloxicam (MOBIC) 7.5 MG tablet; Take 1 tablet (7.5 mg total) by mouth daily.  Dispense: 30 tablet; Refill: 0  3. Type 1 diabetes mellitus without complication (HCC) - Her HgbA1c is improving at 7.7%, her goal should be less  than 6%. She was advised to check blood glucose tid, record and bring log to follow up appointment. She was advised to continue on low carb/ non concentrated sweet diet and exercise as tolerated. - Ambulatory referral to Ophthalmology - HgB A1c; Future   Follow-up: Return in about 3 months (around 10/22/2020), or if  symptoms worsen or fail to improve.    Shermon Bozzi Trellis Paganini, NP

## 2020-07-23 NOTE — Patient Instructions (Signed)
DASH Eating Plan DASH stands for "Dietary Approaches to Stop Hypertension." The DASH eating plan is a healthy eating plan that has been shown to reduce high blood pressure (hypertension). It may also reduce your risk for type 2 diabetes, heart disease, and stroke. The DASH eating plan may also help with weight loss. What are tips for following this plan?  General guidelines  Avoid eating more than 2,300 mg (milligrams) of salt (sodium) a day. If you have hypertension, you may need to reduce your sodium intake to 1,500 mg a day.  Limit alcohol intake to no more than 1 drink a day for nonpregnant women and 2 drinks a day for men. One drink equals 12 oz of beer, 5 oz of wine, or 1 oz of hard liquor.  Work with your health care provider to maintain a healthy body weight or to lose weight. Ask what an ideal weight is for you.  Get at least 30 minutes of exercise that causes your heart to beat faster (aerobic exercise) most days of the week. Activities may include walking, swimming, or biking.  Work with your health care provider or diet and nutrition specialist (dietitian) to adjust your eating plan to your individual calorie needs. Reading food labels   Check food labels for the amount of sodium per serving. Choose foods with less than 5 percent of the Daily Value of sodium. Generally, foods with less than 300 mg of sodium per serving fit into this eating plan.  To find whole grains, look for the word "whole" as the first word in the ingredient list. Shopping  Buy products labeled as "low-sodium" or "no salt added."  Buy fresh foods. Avoid canned foods and premade or frozen meals. Cooking  Avoid adding salt when cooking. Use salt-free seasonings or herbs instead of table salt or sea salt. Check with your health care provider or pharmacist before using salt substitutes.  Do not fry foods. Cook foods using healthy methods such as baking, boiling, grilling, and broiling instead.  Cook with  heart-healthy oils, such as olive, canola, soybean, or sunflower oil. Meal planning  Eat a balanced diet that includes: ? 5 or more servings of fruits and vegetables each day. At each meal, try to fill half of your plate with fruits and vegetables. ? Up to 6-8 servings of whole grains each day. ? Less than 6 oz of lean meat, poultry, or fish each day. A 3-oz serving of meat is about the same size as a deck of cards. One egg equals 1 oz. ? 2 servings of low-fat dairy each day. ? A serving of nuts, seeds, or beans 5 times each week. ? Heart-healthy fats. Healthy fats called Omega-3 fatty acids are found in foods such as flaxseeds and coldwater fish, like sardines, salmon, and mackerel.  Limit how much you eat of the following: ? Canned or prepackaged foods. ? Food that is high in trans fat, such as fried foods. ? Food that is high in saturated fat, such as fatty meat. ? Sweets, desserts, sugary drinks, and other foods with added sugar. ? Full-fat dairy products.  Do not salt foods before eating.  Try to eat at least 2 vegetarian meals each week.  Eat more home-cooked food and less restaurant, buffet, and fast food.  When eating at a restaurant, ask that your food be prepared with less salt or no salt, if possible. What foods are recommended? The items listed may not be a complete list. Talk with your dietitian about   what dietary choices are best for you. Grains Whole-grain or whole-wheat bread. Whole-grain or whole-wheat pasta. Brown rice. Oatmeal. Quinoa. Bulgur. Whole-grain and low-sodium cereals. Pita bread. Low-fat, low-sodium crackers. Whole-wheat flour tortillas. Vegetables Fresh or frozen vegetables (raw, steamed, roasted, or grilled). Low-sodium or reduced-sodium tomato and vegetable juice. Low-sodium or reduced-sodium tomato sauce and tomato paste. Low-sodium or reduced-sodium canned vegetables. Fruits All fresh, dried, or frozen fruit. Canned fruit in natural juice (without  added sugar). Meat and other protein foods Skinless chicken or turkey. Ground chicken or turkey. Pork with fat trimmed off. Fish and seafood. Egg whites. Dried beans, peas, or lentils. Unsalted nuts, nut butters, and seeds. Unsalted canned beans. Lean cuts of beef with fat trimmed off. Low-sodium, lean deli meat. Dairy Low-fat (1%) or fat-free (skim) milk. Fat-free, low-fat, or reduced-fat cheeses. Nonfat, low-sodium ricotta or cottage cheese. Low-fat or nonfat yogurt. Low-fat, low-sodium cheese. Fats and oils Soft margarine without trans fats. Vegetable oil. Low-fat, reduced-fat, or light mayonnaise and salad dressings (reduced-sodium). Canola, safflower, olive, soybean, and sunflower oils. Avocado. Seasoning and other foods Herbs. Spices. Seasoning mixes without salt. Unsalted popcorn and pretzels. Fat-free sweets. What foods are not recommended? The items listed may not be a complete list. Talk with your dietitian about what dietary choices are best for you. Grains Baked goods made with fat, such as croissants, muffins, or some breads. Dry pasta or rice meal packs. Vegetables Creamed or fried vegetables. Vegetables in a cheese sauce. Regular canned vegetables (not low-sodium or reduced-sodium). Regular canned tomato sauce and paste (not low-sodium or reduced-sodium). Regular tomato and vegetable juice (not low-sodium or reduced-sodium). Pickles. Olives. Fruits Canned fruit in a light or heavy syrup. Fried fruit. Fruit in cream or butter sauce. Meat and other protein foods Fatty cuts of meat. Ribs. Fried meat. Bacon. Sausage. Bologna and other processed lunch meats. Salami. Fatback. Hotdogs. Bratwurst. Salted nuts and seeds. Canned beans with added salt. Canned or smoked fish. Whole eggs or egg yolks. Chicken or turkey with skin. Dairy Whole or 2% milk, cream, and half-and-half. Whole or full-fat cream cheese. Whole-fat or sweetened yogurt. Full-fat cheese. Nondairy creamers. Whipped toppings.  Processed cheese and cheese spreads. Fats and oils Butter. Stick margarine. Lard. Shortening. Ghee. Bacon fat. Tropical oils, such as coconut, palm kernel, or palm oil. Seasoning and other foods Salted popcorn and pretzels. Onion salt, garlic salt, seasoned salt, table salt, and sea salt. Worcestershire sauce. Tartar sauce. Barbecue sauce. Teriyaki sauce. Soy sauce, including reduced-sodium. Steak sauce. Canned and packaged gravies. Fish sauce. Oyster sauce. Cocktail sauce. Horseradish that you find on the shelf. Ketchup. Mustard. Meat flavorings and tenderizers. Bouillon cubes. Hot sauce and Tabasco sauce. Premade or packaged marinades. Premade or packaged taco seasonings. Relishes. Regular salad dressings. Where to find more information:  National Heart, Lung, and Blood Institute: www.nhlbi.nih.gov  American Heart Association: www.heart.org Summary  The DASH eating plan is a healthy eating plan that has been shown to reduce high blood pressure (hypertension). It may also reduce your risk for type 2 diabetes, heart disease, and stroke.  With the DASH eating plan, you should limit salt (sodium) intake to 2,300 mg a day. If you have hypertension, you may need to reduce your sodium intake to 1,500 mg a day.  When on the DASH eating plan, aim to eat more fresh fruits and vegetables, whole grains, lean proteins, low-fat dairy, and heart-healthy fats.  Work with your health care provider or diet and nutrition specialist (dietitian) to adjust your eating plan to your   individual calorie needs. This information is not intended to replace advice given to you by your health care provider. Make sure you discuss any questions you have with your health care provider. Document Revised: 10/14/2017 Document Reviewed: 10/25/2016 Elsevier Patient Education  2020 Elsevier Inc. Carbohydrate Counting for Diabetes Mellitus, Adult  Carbohydrate counting is a method of keeping track of how many carbohydrates you  eat. Eating carbohydrates naturally increases the amount of sugar (glucose) in the blood. Counting how many carbohydrates you eat helps keep your blood glucose within normal limits, which helps you manage your diabetes (diabetes mellitus). It is important to know how many carbohydrates you can safely have in each meal. This is different for every person. A diet and nutrition specialist (registered dietitian) can help you make a meal plan and calculate how many carbohydrates you should have at each meal and snack. Carbohydrates are found in the following foods:  Grains, such as breads and cereals.  Dried beans and soy products.  Starchy vegetables, such as potatoes, peas, and corn.  Fruit and fruit juices.  Milk and yogurt.  Sweets and snack foods, such as cake, cookies, candy, chips, and soft drinks. How do I count carbohydrates? There are two ways to count carbohydrates in food. You can use either of the methods or a combination of both. Reading "Nutrition Facts" on packaged food The "Nutrition Facts" list is included on the labels of almost all packaged foods and beverages in the [place] It includes:  The serving size.  Information about nutrients in each serving, including the grams (g) of carbohydrate per serving. To use the "Nutrition Facts":  Decide how many servings you will have.  Multiply the number of servings by the number of carbohydrates per serving.  The resulting number is the total amount of carbohydrates that you will be having. Learning standard serving sizes of other foods When you eat carbohydrate foods that are not packaged or do not include "Nutrition Facts" on the label, you need to measure the servings in order to count the amount of carbohydrates:  Measure the foods that you will eat with a food scale or measuring cup, if needed.  Decide how many standard-size servings you will eat.  Multiply the number of servings by 15. Most carbohydrate-rich foods have  about 15 g of carbohydrates per serving. ? For example, if you eat 8 oz (170 g) of strawberries, you will have eaten 2 servings and 30 g of carbohydrates (2 servings x 15 g = 30 g).  For foods that have more than one food mixed, such as soups and casseroles, you must count the carbohydrates in each food that is included. The following list contains standard serving sizes of common carbohydrate-rich foods. Each of these servings has about 15 g of carbohydrates:   hamburger bun or  English muffin.   oz (15 mL) syrup.   oz (14 g) jelly.  1 slice of bread.  1 six-inch tortilla.  3 oz (85 g) cooked rice or pasta.  4 oz (113 g) cooked dried beans.  4 oz (113 g) starchy vegetable, such as peas, corn, or potatoes.  4 oz (113 g) hot cereal.  4 oz (113 g) mashed potatoes or  of a large baked potato.  4 oz (113 g) canned or frozen fruit.  4 oz (120 mL) fruit juice.  4-6 crackers.  6 chicken nuggets.  6 oz (170 g) unsweetened dry cereal.  6 oz (170 g) plain fat-free yogurt or yogurt sweetened with   artificial sweeteners.  8 oz (240 mL) milk.  8 oz (170 g) fresh fruit or one small piece of fruit.  24 oz (680 g) popped popcorn. Example of carbohydrate counting Sample meal  3 oz (85 g) chicken breast.  6 oz (170 g) brown rice.  4 oz (113 g) corn.  8 oz (240 mL) milk.  8 oz (170 g) strawberries with sugar-free whipped topping. Carbohydrate calculation 1. Identify the foods that contain carbohydrates: ? Rice. ? Corn. ? Milk. ? Strawberries. 2. Calculate how many servings you have of each food: ? 2 servings rice. ? 1 serving corn. ? 1 serving milk. ? 1 serving strawberries. 3. Multiply each number of servings by 15 g: ? 2 servings rice x 15 g = 30 g. ? 1 serving corn x 15 g = 15 g. ? 1 serving milk x 15 g = 15 g. ? 1 serving strawberries x 15 g = 15 g. 4. Add together all of the amounts to find the total grams of carbohydrates eaten: ? 30 g + 15 g + 15 g + 15  g = 75 g of carbohydrates total. Summary  Carbohydrate counting is a method of keeping track of how many carbohydrates you eat.  Eating carbohydrates naturally increases the amount of sugar (glucose) in the blood.  Counting how many carbohydrates you eat helps keep your blood glucose within normal limits, which helps you manage your diabetes.  A diet and nutrition specialist (registered dietitian) can help you make a meal plan and calculate how many carbohydrates you should have at each meal and snack. This information is not intended to replace advice given to you by your health care provider. Make sure you discuss any questions you have with your health care provider. Document Revised: 05/26/2017 Document Reviewed: 04/14/2016 Elsevier Patient Education  2020 Elsevier Inc.  

## 2020-07-24 ENCOUNTER — Ambulatory Visit: Payer: Self-pay | Admitting: Gerontology

## 2020-07-25 ENCOUNTER — Telehealth: Payer: Self-pay | Admitting: Pharmacist

## 2020-07-25 NOTE — Telephone Encounter (Signed)
07/25/2020 1:40:56 PM - Refill to [facility]-Novolog Flexpen & tips  -- [name] - Friday, July 25, 2020 1:39 PM --[employer] refill request to [facility] for Novolog Flexpen Inject 5-7 units daily with meals (max 21 units daily) # 2 boxes & Novofine 32G tips # 2 boxes.

## 2020-07-29 ENCOUNTER — Other Ambulatory Visit: Payer: Self-pay

## 2020-07-29 ENCOUNTER — Ambulatory Visit: Payer: Self-pay | Attending: Gerontology

## 2020-07-29 ENCOUNTER — Telehealth: Payer: Self-pay | Admitting: Pharmacist

## 2020-07-29 DIAGNOSIS — G8929 Other chronic pain: Secondary | ICD-10-CM | POA: Insufficient documentation

## 2020-07-29 DIAGNOSIS — R262 Difficulty in walking, not elsewhere classified: Secondary | ICD-10-CM | POA: Insufficient documentation

## 2020-07-29 DIAGNOSIS — M25562 Pain in left knee: Secondary | ICD-10-CM | POA: Insufficient documentation

## 2020-07-29 DIAGNOSIS — M6281 Muscle weakness (generalized): Secondary | ICD-10-CM | POA: Insufficient documentation

## 2020-07-29 DIAGNOSIS — M25662 Stiffness of left knee, not elsewhere classified: Secondary | ICD-10-CM | POA: Insufficient documentation

## 2020-07-29 NOTE — Therapy (Signed)
[facility] [address] [place], [address] Phone: [phone]   Fax:  [phone]  Physical Therapy Treatment  Patient Details  Name: Courtney Stafford MRN: [id number] Date of Birth: [birth year] Referring Provider (PT): Daymon Larsen, NP   Encounter Date: 07/29/2020   PT End of Session - 07/29/20 1012    Visit Number 8    Number of Visits 16    Date for PT Re-Evaluation 08/11/20    Authorization Time Period 04/30/20-06/25/20; 06/30/20-08/11/20    PT Start Time 0900    PT Stop Time 0945    PT Time Calculation (min) 45 min    Activity Tolerance Patient tolerated treatment well;No increased pain    Behavior During Therapy WFL for tasks assessed/performed           Past Medical History:  Diagnosis Date  . Diabetes mellitus without complication (HCC)   . Hypertension     Past Surgical History:  Procedure Laterality Date  . CARPAL TUNNEL RELEASE  6 years ago   both hands    There were no vitals filed for this visit.   Subjective Assessment - 07/29/20 0911    Subjective Patient reports that she is having difficulty walking to the store (~10 min) states sh ehas to stop after around 5 mins secondary to pain and fatigue.    Pertinent History Pt presents to OPPT for ongoing progressive Left knee pain. Pt was seen by Allie Bossier in May 2021, who diagnosed knee OA questionable need for TKA in future, has recommended conservative management at this point. Pt had a cortizone injection in May (without avail) and is shceduled for a 'gel' injection on 6/28.    Limitations Walking;Lifting;Standing    How long can you sit comfortably? <30 minutes    How long can you stand comfortably? ~10 minutes    How long can you walk comfortably? ~roughly short community distances    Diagnostic tests [place], Xray (unremarkable); MRI with "bone on bone" action going on    Patient Stated Goals Be able to tolerate walking better,  teach independent means to manage symptoms    Currently in Pain? Yes    Pain Score 5     Pain Location Knee    Pain Orientation Left    Pain Descriptors / Indicators Aching    Pain Type Chronic pain    Pain Onset More than a month ago    Pain Frequency Intermittent           TREATMENT     Therapeutic Exercise  -AA/ROM Nustep c BUE assist x5 minutes, Seat 4, Arms 4; level: 3 - Sit to stand - with hands on knees x 10, x 10 hands out forward, x 10 arms across chest - SLR - 3 x 10 B -Standing Hip Abduction 2x10 RTB BUE supported -Standing Hip Extension 2x10 RTB BUE supported - Single leg heel raises in standing - 2 x 10 B   Performed Exercises to improve strength and mobility of LE's       PT Education - 07/29/20 0927    Education Details form/technique with exercise    Person(s) Educated Patient    Methods Explanation;Demonstration    Comprehension Verbalized understanding;Returned demonstration            PT Short Term Goals - 06/30/20 1129      PT SHORT TERM GOAL #1   Title Patient will be able to tolerate walking a distance of >  584ft without having to take a rest break.    Baseline ~225 ft at Eval; 8/16: 571ft in 4'54" (heavy lateral sway over an abducted LLE)    Time 4    Period Weeks    Status On-going    Target Date 07/28/20             PT Long Term Goals - 06/30/20 1134      PT LONG TERM GOAL #1   Title Patient will demostrate an increase in activity tolerance by being able to ambulate a distance of >1034ft without rest breaks.    Baseline ~264ft at Eval; 8/16: 537ft    Time 8    Period Weeks    Status On-going    Target Date 08/25/20      PT LONG TERM GOAL #2   Title Patient will have a FOTO score of >56/100 to show an increase in independence while performing functional daily activities.    Baseline 41/100 at Eval; 06/30/20: 36/100    Time 8    Period Weeks    Status On-going    Target Date 08/25/20      PT LONG TERM GOAL #3   Title  Patient will demonstrate improved facility and steadiness in ambulation while performing at a gait speed of >0.50m/s.    Baseline Self Selected gait speed <0.4; 06/30/20: 0.92m/s    Time 8    Period Weeks    Status On-going    Target Date 08/25/20                 Plan - 07/29/20 1013    Clinical Impression Statement Focused on exercises to be performed at home, added exercises to HEP secondary to transportation difficulties she can began to further improve symptoms at home. Focused on standing for majority of the session, patient able to tolerate these difficulties without signfiicant different, however does require sitting rest breaks secondary to fatigue. Patient will benefit form further skilled therapy to return to prior level of function.    Personal Factors and Comorbidities Age;Behavior Pattern;Fitness;Transportation;Time since onset of injury/illness/exacerbation    Examination-Activity Limitations Toileting;Dressing;Sit;Transfers;Bed Mobility;Lift;Bend;Squat;Locomotion Level;Stairs;Carry;Stand    Examination-Participation Restrictions Yard Work;Community Activity;Shop    Stability/Clinical Decision Making Stable/Uncomplicated    Rehab Potential Good    PT Frequency 2x / week    PT Duration 8 weeks    PT Treatment/Interventions ADLs/Self Care Home Management;Cryotherapy;Electrical Stimulation;Moist Heat;DME Instruction;Gait training;Stair training;Functional mobility training;Therapeutic activities;Therapeutic exercise;Balance training;Neuromuscular re-education;Patient/family education;Manual techniques;Passive range of motion;Dry needling;Taping;Energy conservation;Ultrasound    PT Next Visit Plan Progress strengthening and ROM    PT Home Exercise Plan Hooklying and Seated Heel Slides and SAQ    Consulted and Agree with Plan of Care Patient           Patient will benefit from skilled therapeutic intervention in order to improve the following deficits and impairments:   Decreased endurance, Decreased mobility, Abnormal gait, Difficulty walking, Decreased range of motion, Obesity, Decreased activity tolerance, Decreased knowledge of use of DME, Decreased strength, Postural dysfunction, Pain, Improper body mechanics, Hypomobility  Visit Diagnosis: Chronic pain of left knee  Stiffness of left knee, not elsewhere classified     Problem List Patient Active Problem List   Diagnosis Date Noted  . Urinary frequency 05/21/2020  . Unilateral primary osteoarthritis, left knee 03/17/2020  . Left knee pain 12/11/2019  . Swelling of lower leg 12/04/2019  . Pain and swelling of lower leg, left 12/04/2019  . Insomnia 08/28/2019  . 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 HTN (hypertension) 10/22/2015  . Type 1 diabetes (HCC) 10/22/2015  . Anemia 10/22/2015  . Restless legs 10/22/2015  . Hematuria 04/09/2015    Myrene Galas, PT DPT 07/29/2020, 10:16 AM  [facility] SPORTS [facility]IC[facility] [address], [place], [address] Phone: [phone]   Fax:  [phone]  Name: REGGIE WELGE MRN: [id number] Date of Birth: [birth year]

## 2020-07-29 NOTE — Telephone Encounter (Signed)
07/29/2020 2:51:15 PM - Novolog Flexpen & tips refill to [facility]  -- [name] - Tuesday, July 29, 2020 2:49 PM --Faxed Novolog Flexpen & tips to [facility]-Inject 5-7 units daily with meals (max 21 units)  # 2 boxes & tips #2 boxes.

## 2020-07-31 ENCOUNTER — Inpatient Hospital Stay
Admission: EM | Admit: 2020-07-31 | Discharge: 2020-08-02 | DRG: 066 | Disposition: A | Discharge status: Home / Self Care | Payer: Self-pay | Attending: Internal Medicine | Admitting: Internal Medicine

## 2020-07-31 ENCOUNTER — Inpatient Hospital Stay: Payer: Self-pay

## 2020-07-31 ENCOUNTER — Other Ambulatory Visit: Payer: Self-pay

## 2020-07-31 ENCOUNTER — Emergency Department: Payer: Self-pay

## 2020-07-31 ENCOUNTER — Ambulatory Visit: Payer: Self-pay

## 2020-07-31 DIAGNOSIS — R531 Weakness: Secondary | ICD-10-CM | POA: Diagnosis present

## 2020-07-31 DIAGNOSIS — I10 Essential (primary) hypertension: Secondary | ICD-10-CM | POA: Diagnosis present

## 2020-07-31 DIAGNOSIS — E109 Type 1 diabetes mellitus without complications: Secondary | ICD-10-CM

## 2020-07-31 DIAGNOSIS — I6381 Other cerebral infarction due to occlusion or stenosis of small artery: Principal | ICD-10-CM | POA: Diagnosis present

## 2020-07-31 DIAGNOSIS — Z20822 Contact with and (suspected) exposure to covid-19: Secondary | ICD-10-CM | POA: Diagnosis present

## 2020-07-31 DIAGNOSIS — I639 Cerebral infarction, unspecified: Secondary | ICD-10-CM

## 2020-07-31 DIAGNOSIS — R29703 NIHSS score 3: Secondary | ICD-10-CM | POA: Diagnosis present

## 2020-07-31 DIAGNOSIS — M5124 Other intervertebral disc displacement, thoracic region: Secondary | ICD-10-CM

## 2020-07-31 DIAGNOSIS — E1051 Type 1 diabetes mellitus with diabetic peripheral angiopathy without gangrene: Secondary | ICD-10-CM | POA: Diagnosis present

## 2020-07-31 DIAGNOSIS — Z6839 Body mass index (BMI) 39.0-39.9, adult: Secondary | ICD-10-CM

## 2020-07-31 DIAGNOSIS — M25562 Pain in left knee: Secondary | ICD-10-CM

## 2020-07-31 DIAGNOSIS — E669 Obesity, unspecified: Secondary | ICD-10-CM | POA: Diagnosis present

## 2020-07-31 DIAGNOSIS — F17210 Nicotine dependence, cigarettes, uncomplicated: Secondary | ICD-10-CM | POA: Diagnosis present

## 2020-07-31 DIAGNOSIS — Z794 Long term (current) use of insulin: Secondary | ICD-10-CM

## 2020-07-31 DIAGNOSIS — Z79899 Other long term (current) drug therapy: Secondary | ICD-10-CM

## 2020-07-31 HISTORY — DX: Cerebral infarction, unspecified: I63.9

## 2020-07-31 LAB — CBC
HCT: 49.1 % — ABNORMAL HIGH (ref 36.0–46.0)
Hemoglobin: 15.8 g/dL — ABNORMAL HIGH (ref 12.0–15.0)
MCH: 27.6 pg (ref 26.0–34.0)
MCHC: 32.2 g/dL (ref 30.0–36.0)
MCV: 85.7 fL (ref 80.0–100.0)
Platelets: 336 10*3/uL (ref 150–400)
RBC: 5.73 MIL/uL — ABNORMAL HIGH (ref 3.87–5.11)
RDW: 15.1 % (ref 11.5–15.5)
WBC: 9.1 10*3/uL (ref 4.0–10.5)
nRBC: 0 % (ref 0.0–0.2)

## 2020-07-31 LAB — DIFFERENTIAL
Abs Immature Granulocytes: 0.03 10*3/uL (ref 0.00–0.07)
Basophils Absolute: 0.1 10*3/uL (ref 0.0–0.1)
Basophils Relative: 1 %
Eosinophils Absolute: 0.2 10*3/uL (ref 0.0–0.5)
Eosinophils Relative: 3 %
Immature Granulocytes: 0 %
Lymphocytes Relative: 14 %
Lymphs Abs: 1.2 10*3/uL (ref 0.7–4.0)
Monocytes Absolute: 0.6 10*3/uL (ref 0.1–1.0)
Monocytes Relative: 7 %
Neutro Abs: 6.9 10*3/uL (ref 1.7–7.7)
Neutrophils Relative %: 75 %

## 2020-07-31 LAB — HEMOGLOBIN A1C
Hgb A1c MFr Bld: 7.2 % — ABNORMAL HIGH (ref 4.8–5.6)
Mean Plasma Glucose: 159.94 mg/dL

## 2020-07-31 LAB — LIPID PANEL
Cholesterol: 218 mg/dL — ABNORMAL HIGH (ref 0–200)
HDL: 78 mg/dL (ref >40)
LDL Cholesterol: 109 mg/dL — ABNORMAL HIGH (ref 0–99)
Total CHOL/HDL Ratio: 2.8 RATIO
Triglycerides: 157 mg/dL — ABNORMAL HIGH (ref <150)
VLDL: 31 mg/dL (ref 0–40)

## 2020-07-31 LAB — COMPREHENSIVE METABOLIC PANEL
ALT: 18 U/L (ref 0–44)
AST: 18 U/L (ref 15–41)
Albumin: 4 g/dL (ref 3.5–5.0)
Alkaline Phosphatase: 99 U/L (ref 38–126)
Anion gap: 12 (ref 5–15)
BUN: 12 mg/dL (ref 6–20)
CO2: 25 mmol/L (ref 22–32)
Calcium: 9.3 mg/dL (ref 8.9–10.3)
Chloride: 99 mmol/L (ref 98–111)
Creatinine, Ser: 0.77 mg/dL (ref 0.44–1.00)
GFR calc Af Amer: >60 mL/min (ref >60)
GFR calc non Af Amer: >60 mL/min (ref >60)
Glucose, Bld: 281 mg/dL — ABNORMAL HIGH (ref 70–99)
Potassium: 3.4 mmol/L — ABNORMAL LOW (ref 3.5–5.1)
Sodium: 136 mmol/L (ref 135–145)
Total Bilirubin: 0.6 mg/dL (ref 0.3–1.2)
Total Protein: 7.9 g/dL (ref 6.5–8.1)

## 2020-07-31 LAB — APTT: aPTT: 30 seconds (ref 24–36)

## 2020-07-31 LAB — GLUCOSE, CAPILLARY
Glucose-Capillary: 325 mg/dL — ABNORMAL HIGH (ref 70–99)
Glucose-Capillary: 400 mg/dL — ABNORMAL HIGH (ref 70–99)
Glucose-Capillary: 227 mg/dL — ABNORMAL HIGH (ref 70–99)

## 2020-07-31 LAB — TSH: TSH: 0.951 u[IU]/mL (ref 0.350–4.500)

## 2020-07-31 LAB — SARS CORONAVIRUS 2 BY RT PCR (HOSPITAL ORDER, PERFORMED IN ~~LOC~~ HOSPITAL LAB): SARS Coronavirus 2: NEGATIVE

## 2020-07-31 LAB — PROTIME-INR
INR: 0.9 (ref 0.8–1.2)
Prothrombin Time: 11.3 seconds — ABNORMAL LOW (ref 11.4–15.2)

## 2020-07-31 IMAGING — CT CT ANGIO HEAD
3 of 7 series · 10 of 36 positions shown · IV contrast (APPLIED)
Comparison: Plain head CT [JL] hours today.

CLINICAL DATA: 49-year-old female with altered mental status and
right side symptoms with appearance suspicious for acute small
vessel infarct in the left corona radiata on plain head CT today.

EXAM:
CT ANGIOGRAPHY HEAD AND NECK
TECHNIQUE: Multidetector CT imaging of the head and neck was performed using
the standard protocol during bolus administration of intravenous
contrast. Multiplanar CT image reconstructions and MIPs were
obtained to evaluate the vascular anatomy. Carotid stenosis
measurements (when applicable) are obtained utilizing NASCET
criteria, using the distal internal carotid diameter as the
denominator.
CONTRAST:  75mL OMNIPAQUE IOHEXOL 350 MG/ML SOLN

[Series 4: cta head neck · axial · 0.45mm/px · z∈[-225,-123]mm · 2 of 155 slices shown]
[im 52/155  soft-tissue]
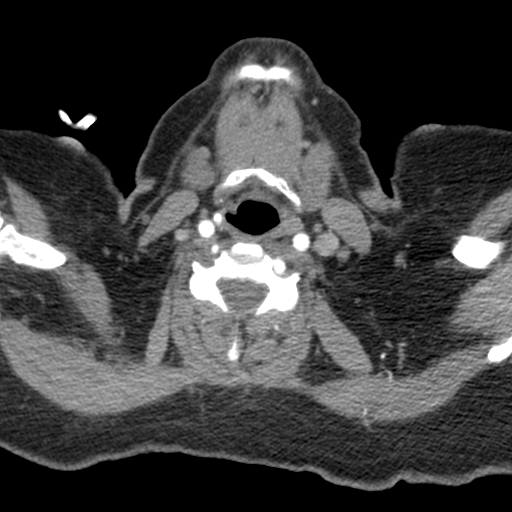
[im 103/155  soft-tissue]
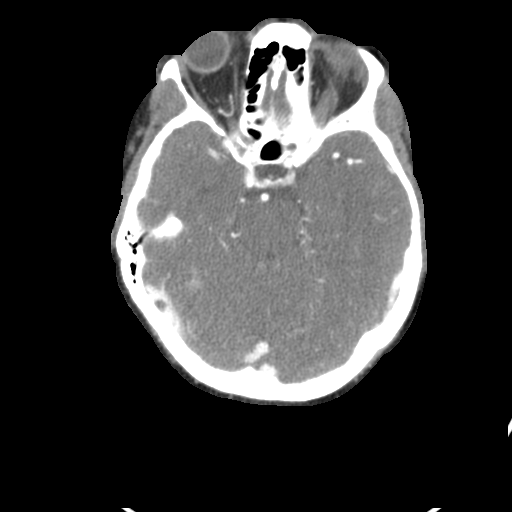

[Series 6: ax thin · axial · 0.54mm/px · z∈[-275,-56]mm · 6 of 309 slices shown]
[im 45/309  soft-tissue]
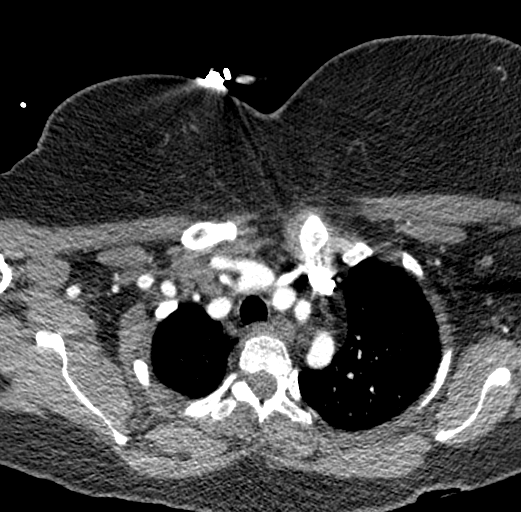
[im 89/309  bone]
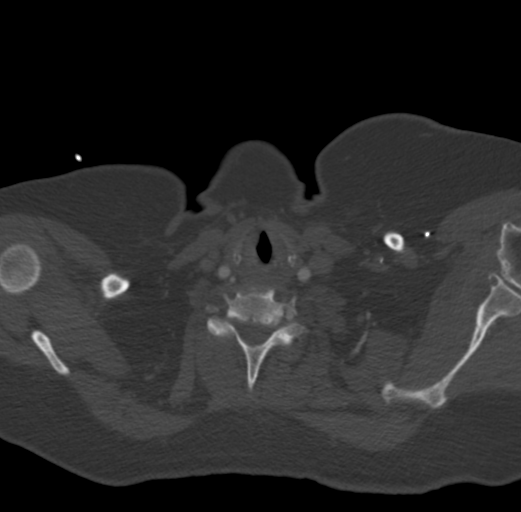
[im 133/309  soft-tissue]
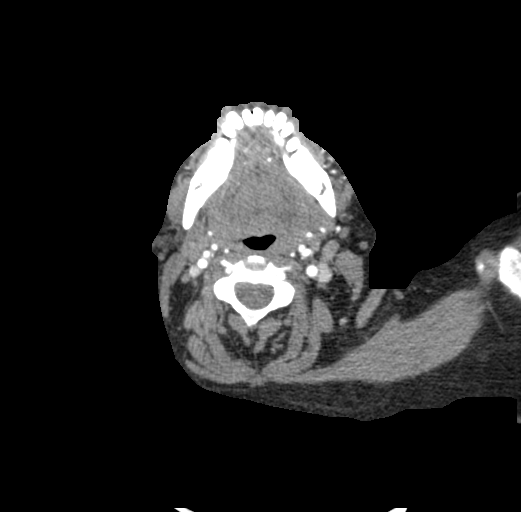
[im 177/309  bone]
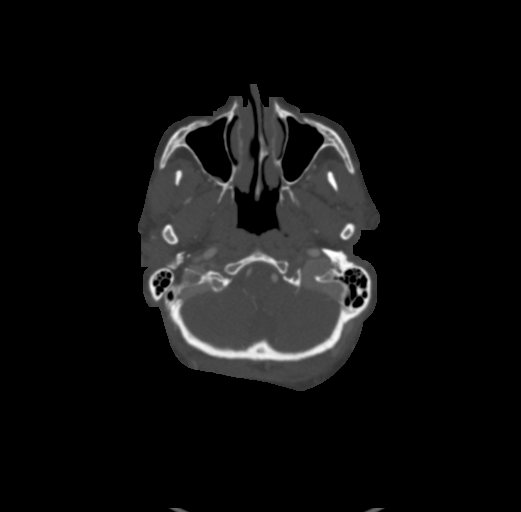
[im 221/309  soft-tissue]
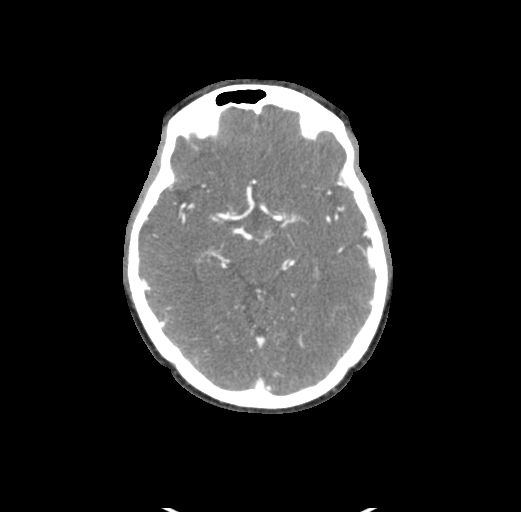
[im 265/309  bone]
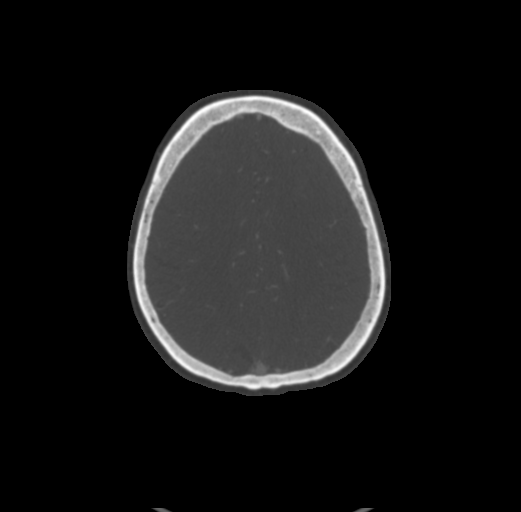

[Series 10: sagittal thin · sagittal · 0.48mm/px · 2 of 273 slices shown]
[im 71/273  soft-tissue]
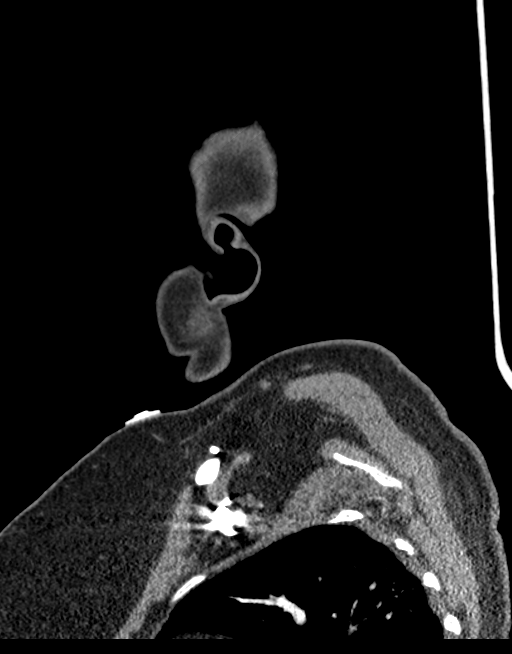
[im 203/273  soft-tissue]
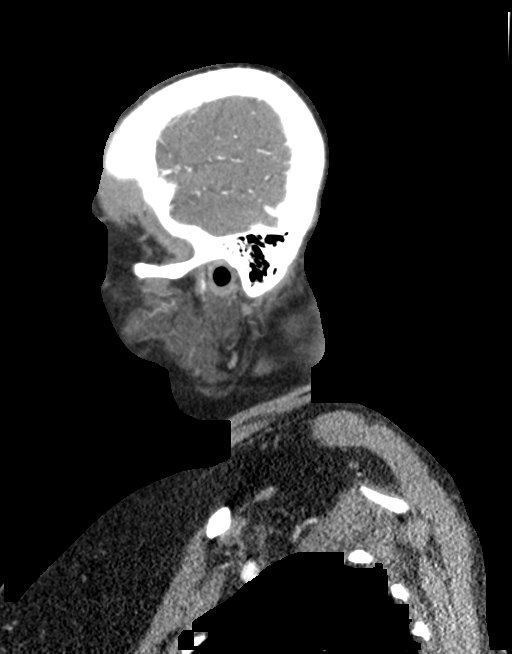

[10 of 36 positions shown; findings below may reference images not displayed]

FINDINGS: CTA NECK

Skeleton: No acute osseous abnormality identified.

Upper chest: Negative.

Other neck: Negative.

Aortic arch: 3 vessel arch configuration with tortuous great
vessels. No arch atherosclerosis.

Right carotid system: Negative aside from tortuous brachiocephalic
artery, cervical right ICA.

Left carotid system: Negative aside from tortuous left CCA and
cervical left ICA.

Vertebral arteries:
Normal proximal right subclavian artery. The right vertebral artery
has an early origin on series 6, image 268, and then the right
vertebral has a late entry into the cervical transverse foramen
(normal variant). The right vertebral is also mildly non dominant
and normal to the skull base.

Tortuous proximal left subclavian artery without stenosis. Dominant
left vertebral artery has a normal origin. The left V2 segment is
tortuous. The left vertebral remains widely patent to the skull
base.

CTA HEAD

Posterior circulation: Dominant left V4 segment. Mild distal
vertebral ectasias a. No distal vertebral plaque or stenosis. Normal
right PICA and dominant appearing left AICA origins. Normal
vertebrobasilar junction and basilar artery. Patent SCA and PCA
origins. Both posterior communicating arteries are present.
Bilateral PCA branches are within normal limits.

Anterior circulation: Trace gas in the left cavernous sinus is
likely intravenous access related. Both ICA siphons are patent with
only minimal plaque. No ICA siphon stenosis. Ophthalmic and
posterior communicating artery origins appear normal. Patent carotid
termini. Normal MCA and ACA origins. Left A1 segment is mildly
dominant. Anterior communicating artery is normal. The right A2 is
dominant, somewhat simulating azygos ACA anatomy. ACA branches are
within normal limits. Left MCA M1 segment and bifurcation are patent
without stenosis. Right MCA M1 segment bifurcates early without
stenosis. Bilateral MCA branches are within normal limits.

Venous sinuses: Patent.

Anatomic variants: Dominant left vertebral artery. Early origin of
the right vertebral. Dominant left A1 and right A2 segments.

Review of the MIP images confirms the above findings
IMPRESSION: Negative for large vessel occlusion. Minimal atherosclerosis with no
stenosis. Generalized arterial tortuosity.

## 2020-07-31 IMAGING — CT CT ANGIO NECK
3 of 7 series · 10 of 36 positions shown · IV contrast (APPLIED)
Comparison: Plain head CT [JL] hours today.

CLINICAL DATA: 49-year-old female with altered mental status and
right side symptoms with appearance suspicious for acute small
vessel infarct in the left corona radiata on plain head CT today.

EXAM:
CT ANGIOGRAPHY HEAD AND NECK
TECHNIQUE: Multidetector CT imaging of the head and neck was performed using
the standard protocol during bolus administration of intravenous
contrast. Multiplanar CT image reconstructions and MIPs were
obtained to evaluate the vascular anatomy. Carotid stenosis
measurements (when applicable) are obtained utilizing NASCET
criteria, using the distal internal carotid diameter as the
denominator.
CONTRAST:  75mL OMNIPAQUE IOHEXOL 350 MG/ML SOLN

[Series 4: cta head neck · axial · 0.45mm/px · z∈[-225,-123]mm · 2 of 155 slices shown]
[im 52/155  soft-tissue]
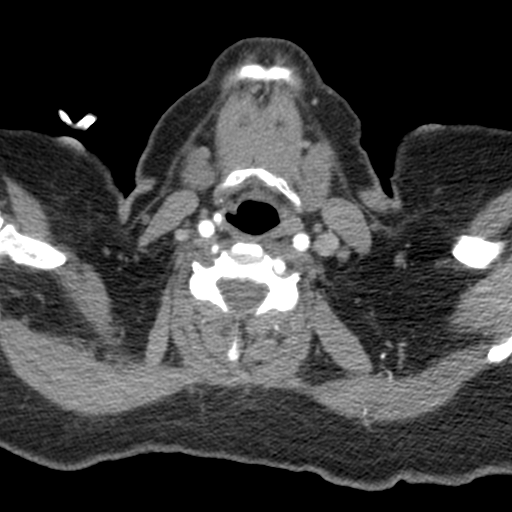
[im 103/155  soft-tissue]
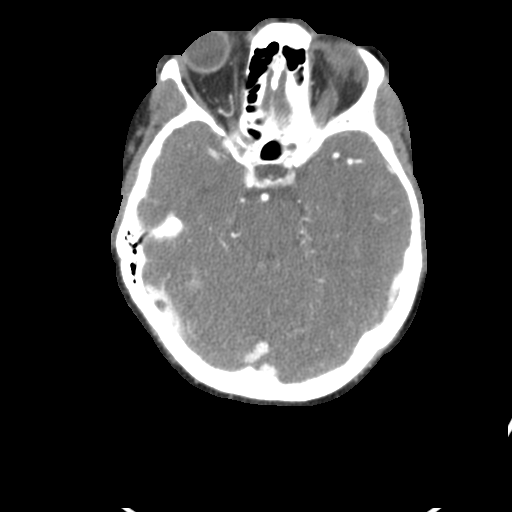

[Series 6: ax thin · axial · 0.54mm/px · z∈[-275,-56]mm · 6 of 309 slices shown]
[im 45/309  soft-tissue]
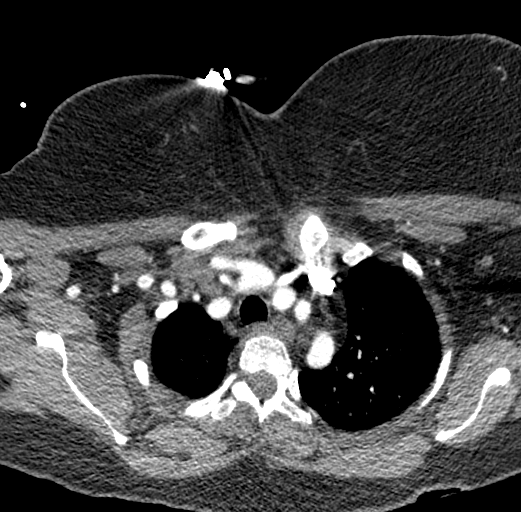
[im 89/309  bone]
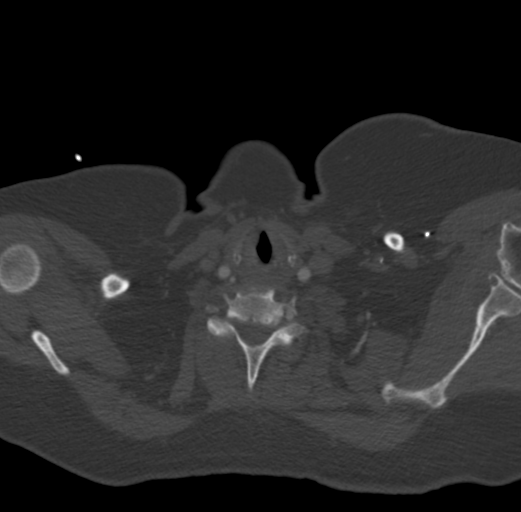
[im 133/309  soft-tissue]
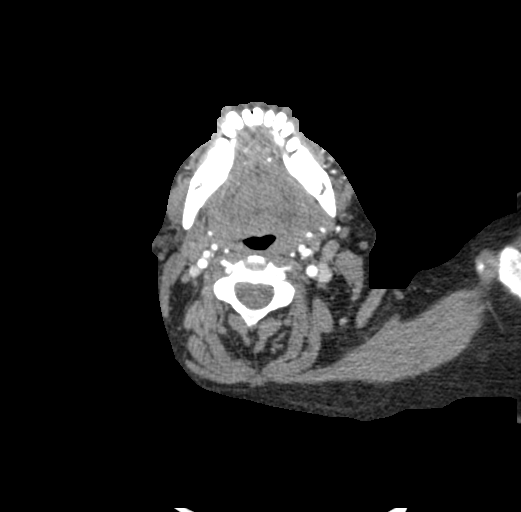
[im 177/309  bone]
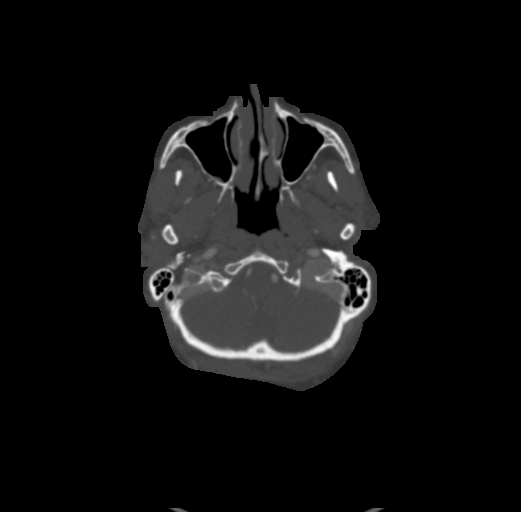
[im 221/309  soft-tissue]
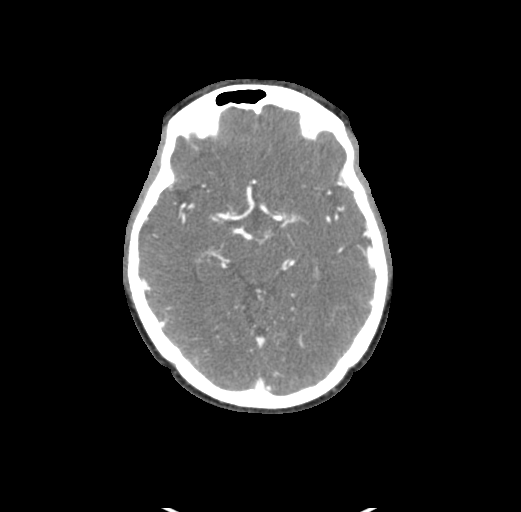
[im 265/309  bone]
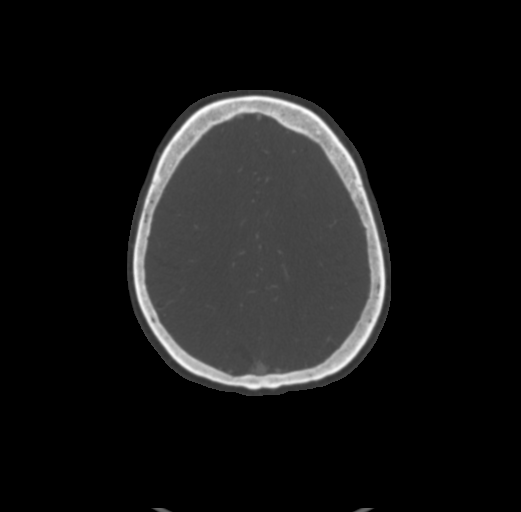

[Series 10: sagittal thin · sagittal · 0.48mm/px · 2 of 273 slices shown]
[im 71/273  soft-tissue]
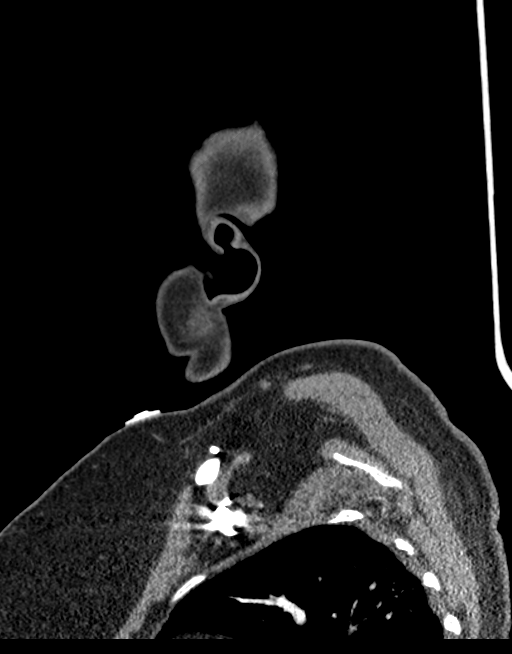
[im 203/273  soft-tissue]
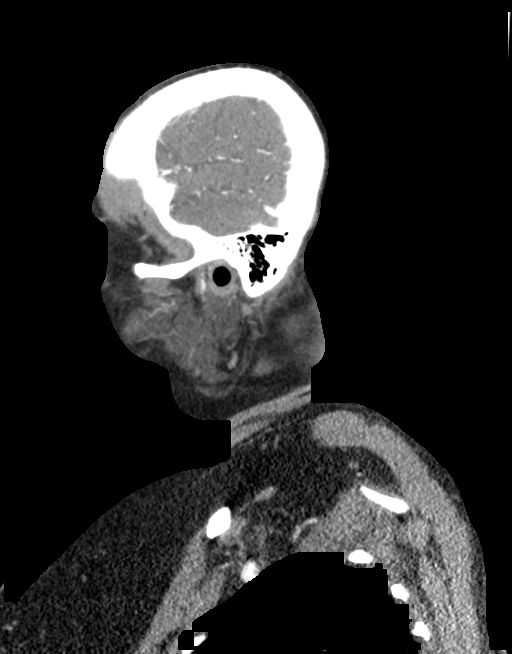

[10 of 36 positions shown; findings below may reference images not displayed]

FINDINGS: CTA NECK

Skeleton: No acute osseous abnormality identified.

Upper chest: Negative.

Other neck: Negative.

Aortic arch: 3 vessel arch configuration with tortuous great
vessels. No arch atherosclerosis.

Right carotid system: Negative aside from tortuous brachiocephalic
artery, cervical right ICA.

Left carotid system: Negative aside from tortuous left CCA and
cervical left ICA.

Vertebral arteries:
Normal proximal right subclavian artery. The right vertebral artery
has an early origin on series 6, image 268, and then the right
vertebral has a late entry into the cervical transverse foramen
(normal variant). The right vertebral is also mildly non dominant
and normal to the skull base.

Tortuous proximal left subclavian artery without stenosis. Dominant
left vertebral artery has a normal origin. The left V2 segment is
tortuous. The left vertebral remains widely patent to the skull
base.

CTA HEAD

Posterior circulation: Dominant left V4 segment. Mild distal
vertebral ectasias a. No distal vertebral plaque or stenosis. Normal
right PICA and dominant appearing left AICA origins. Normal
vertebrobasilar junction and basilar artery. Patent SCA and PCA
origins. Both posterior communicating arteries are present.
Bilateral PCA branches are within normal limits.

Anterior circulation: Trace gas in the left cavernous sinus is
likely intravenous access related. Both ICA siphons are patent with
only minimal plaque. No ICA siphon stenosis. Ophthalmic and
posterior communicating artery origins appear normal. Patent carotid
termini. Normal MCA and ACA origins. Left A1 segment is mildly
dominant. Anterior communicating artery is normal. The right A2 is
dominant, somewhat simulating azygos ACA anatomy. ACA branches are
within normal limits. Left MCA M1 segment and bifurcation are patent
without stenosis. Right MCA M1 segment bifurcates early without
stenosis. Bilateral MCA branches are within normal limits.

Venous sinuses: Patent.

Anatomic variants: Dominant left vertebral artery. Early origin of
the right vertebral. Dominant left A1 and right A2 segments.

Review of the MIP images confirms the above findings
IMPRESSION: Negative for large vessel occlusion. Minimal atherosclerosis with no
stenosis. Generalized arterial tortuosity.

## 2020-07-31 IMAGING — MR MR HEAD W/O CM
13 series · 45 of 48 positions shown · non-contrast
Comparison: None.

CLINICAL DATA: Right-sided weakness

EXAM:
MRI HEAD WITHOUT CONTRAST
TECHNIQUE: Multiplanar, multiecho pulse sequences of the brain and surrounding
structures were obtained without intravenous contrast.

[Series 5: ax dwi_tracew · axial · 3.0mm · 0.60mm/px · z∈[-97,+57]mm · 7 of 96 slices shown]
[im 1/96]
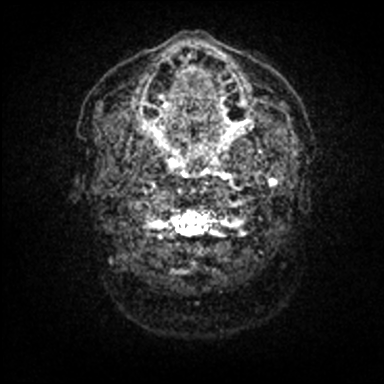
[im 16/96]
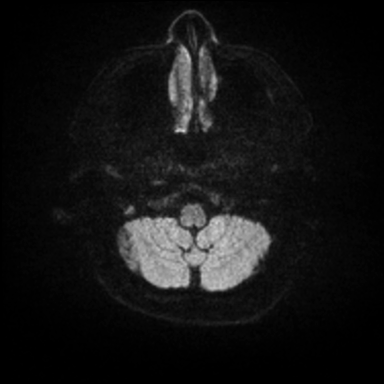
[im 32/96]
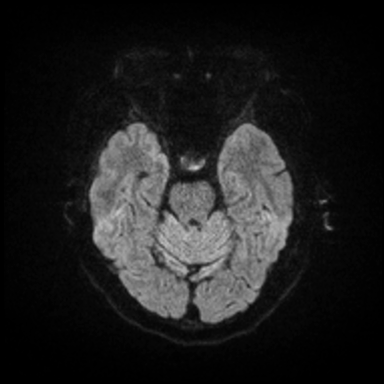
[im 48/96]
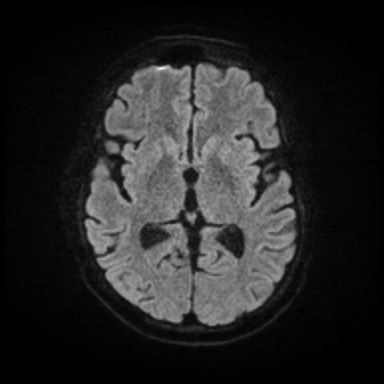
[im 64/96]
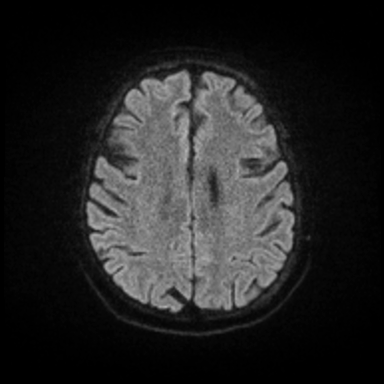
[im 80/96]
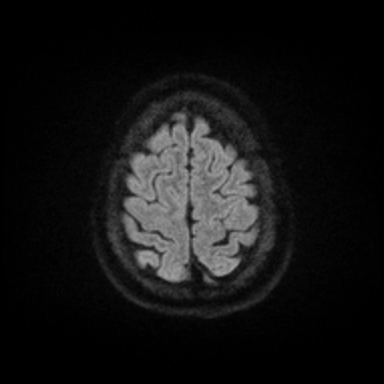
[im 96/96]
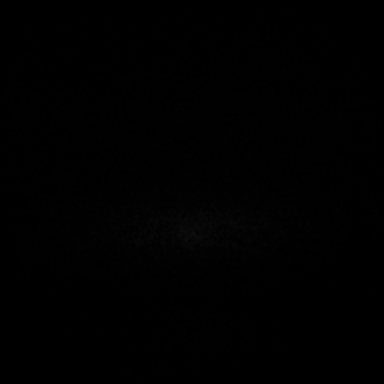

[Series 6: ax dwi_adc · axial · 3.0mm · 0.60mm/px · z∈[-97,+54]mm · 3 of 47 slices shown]
[im 1/47]
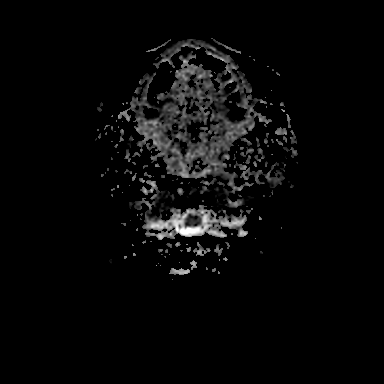
[im 24/47]
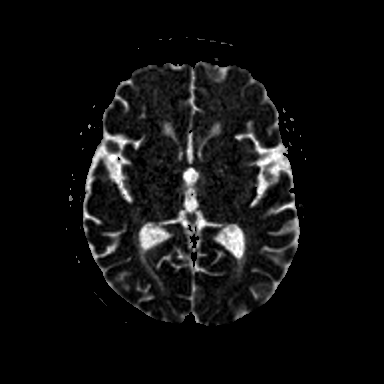
[im 47/47]
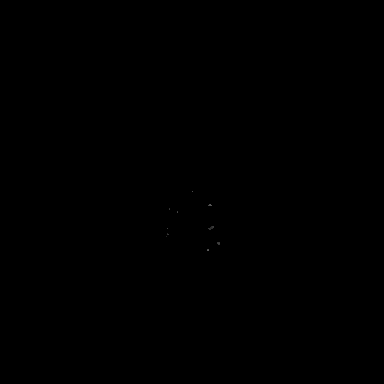

[Series 7: cor dwi_tracew · coronal · 5.0mm · 0.60mm/px · 4 of 68 slices shown]
[im 1/68]
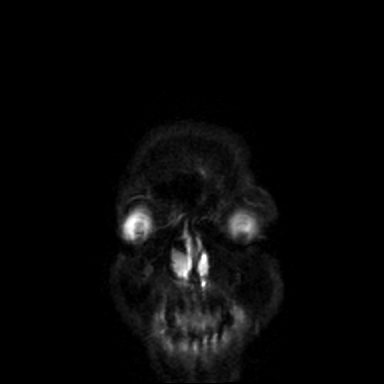
[im 23/68]
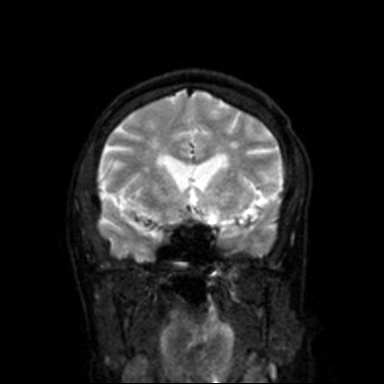
[im 45/68]
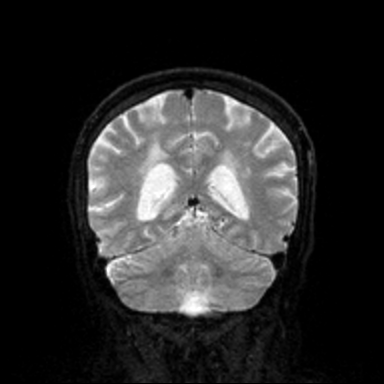
[im 68/68]
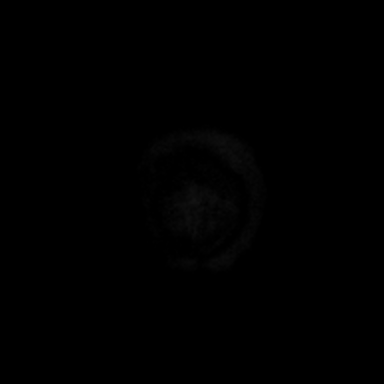

[Series 8: cor dwi_adc · coronal · 5.0mm · 0.60mm/px · 2 of 34 slices shown]
[im 1/34]
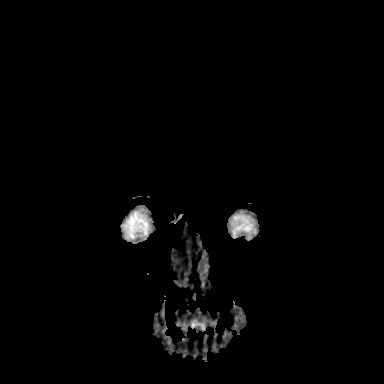
[im 34/34]
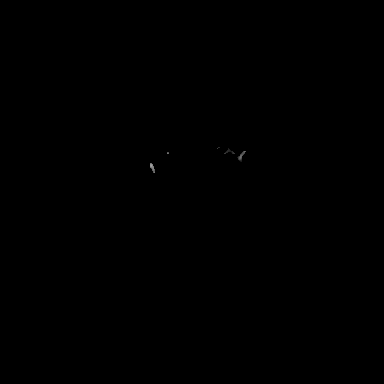

[Series 9: T1 · sagittal · 5.0mm · 0.62mm/px · 1 of 21 slices shown (1 of 2)]
[im 1/21]
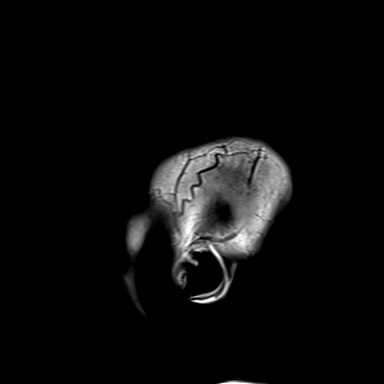

[Series 10: T2 · axial · 5.0mm · 0.53mm/px · z∈[-90,+53]mm · 2 of 25 slices shown (1 of 2)]
[im 1/25]
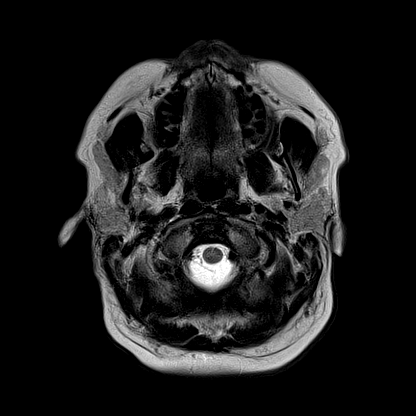
[im 25/25]
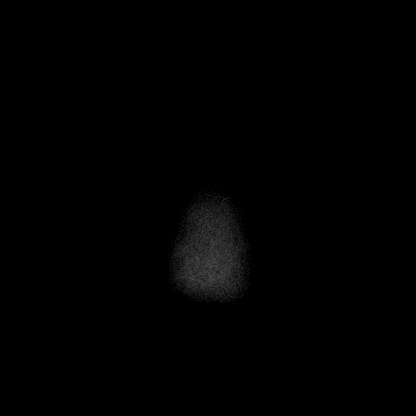

[Series 11: mag_images · axial · 3.0mm · 0.90mm/px · z∈[-107,+69]mm · 4 of 60 slices shown]
[im 1/60]
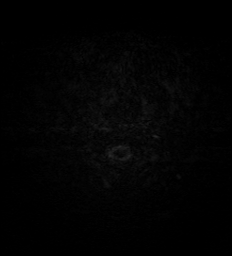
[im 20/60]
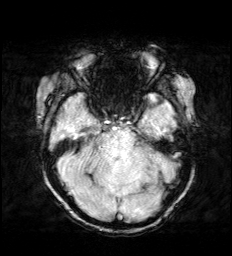
[im 40/60]
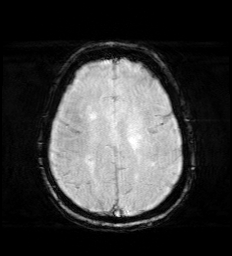
[im 60/60]
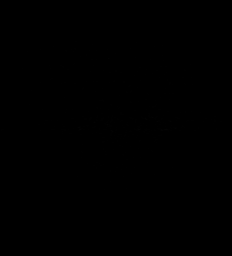

[Series 12: pha_images · axial · 3.0mm · 0.90mm/px · z∈[-104,+54]mm · 3 of 54 slices shown]
[im 1/54]
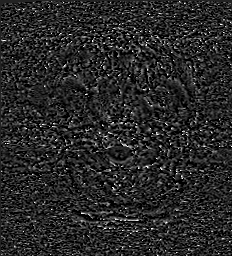
[im 27/54]
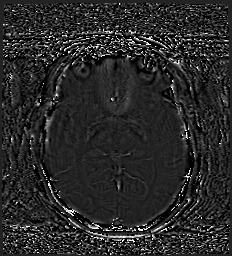
[im 54/54]
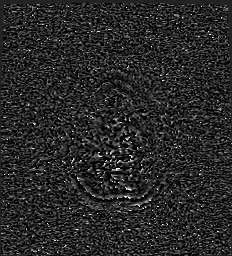

[Series 13: swi_images · axial · 3.0mm · 0.90mm/px · z∈[-107,+69]mm · 4 of 60 slices shown]
[im 1/60]
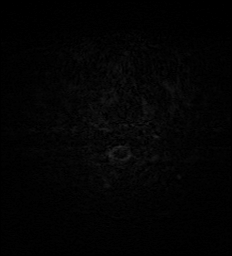
[im 20/60]
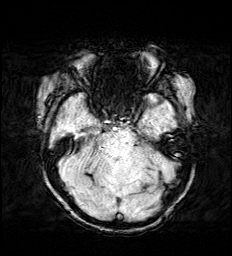
[im 40/60]
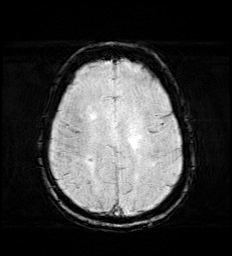
[im 60/60]
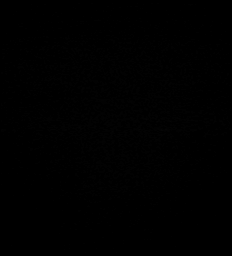

[Series 14: mip_images(sw) · axial · 24.0mm · 0.90mm/px · z∈[-97,-19]mm · 2 of 53 slices shown]
[im 1/53]
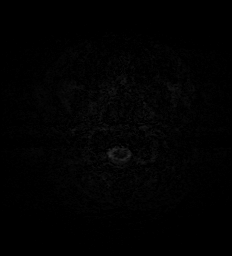
[im 27/53]
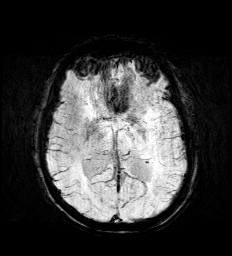

[Series 15: FLAIR · axial · 3.0mm · 0.53mm/px · z∈[-99,+62]mm · 3 of 55 slices shown]
[im 1/55]
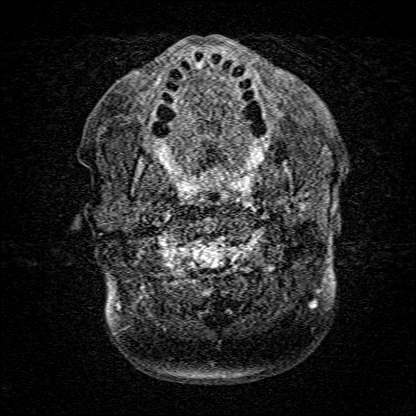
[im 28/55]
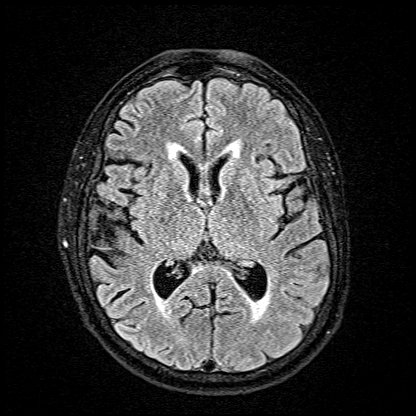
[im 55/55]
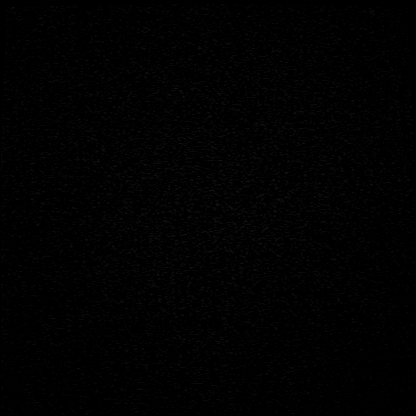

[Series 16: T1 · axial · 1.0mm · 0.98mm/px · z∈[-101,+57]mm · 8 of 160 slices shown (2 of 2)]
[im 1/160]
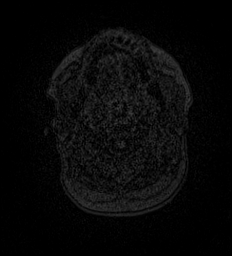
[im 18/160]
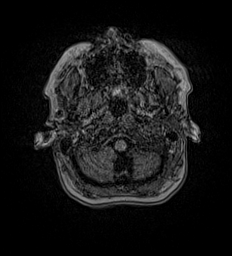
[im 54/160]
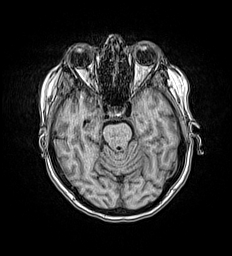
[im 71/160]
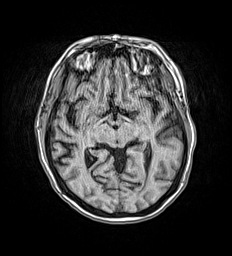
[im 89/160]
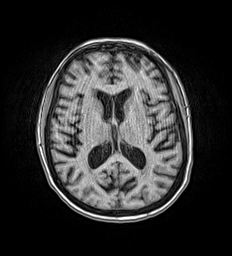
[im 107/160]
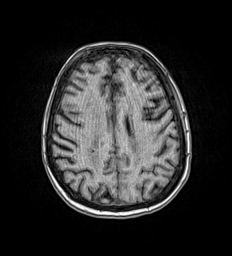
[im 142/160]
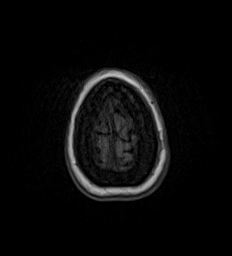
[im 160/160]
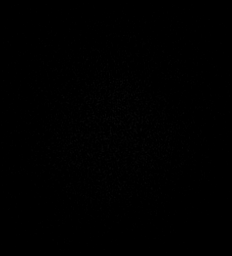

[Series 17: T2 · coronal · 5.0mm · 0.57mm/px · 2 of 27 slices shown (2 of 2)]
[im 1/27]
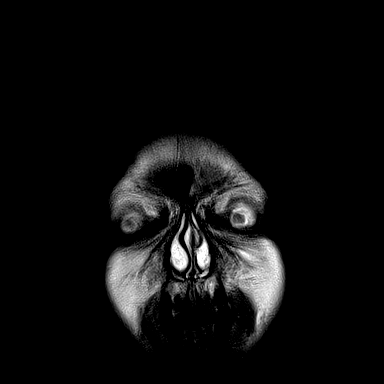
[im 27/27]
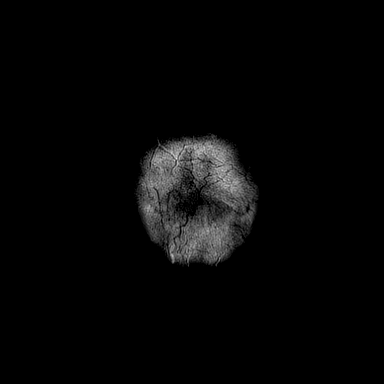

[45 of 48 positions shown; findings below may reference images not displayed]

FINDINGS: Brain: There is a small acute infarct within the posterior aspect of
the left caudate. No other diffusion abnormality. No acute
hemorrhage. Multifocal hyperintense T2-weighted signal within the
white matter. Normal volume of CSF spaces. Single focus of chronic
microhemorrhage in the left pons. Normal midline structures.

Vascular: Normal flow voids.

Skull and upper cervical spine: Normal marrow signal.

Sinuses/Orbits: Negative.

Other: None.
IMPRESSION: 1. Small acute infarct within the posterior aspect of the left
caudate nucleus. No acute hemorrhage or mass effect.
2. Multifocal hyperintense T2-weighted signal within the white
matter, most commonly chronic small vessel ischemia.

## 2020-07-31 IMAGING — CT CT HEAD W/O CM
3 series · 16 of 47 positions shown, 19 images · non-contrast
Comparison: CT head [DATE]

CLINICAL DATA: Mental status change. Weakness and numbness right
body.

EXAM:
CT HEAD WITHOUT CONTRAST
TECHNIQUE: Contiguous axial images were obtained from the base of the skull
through the vertex without intravenous contrast.

[Series 2: head wo · axial · 0.40mm/px · z∈[+500,+625]mm · 10 of 31 slices shown, 13 images]
[im 3/31  brain]
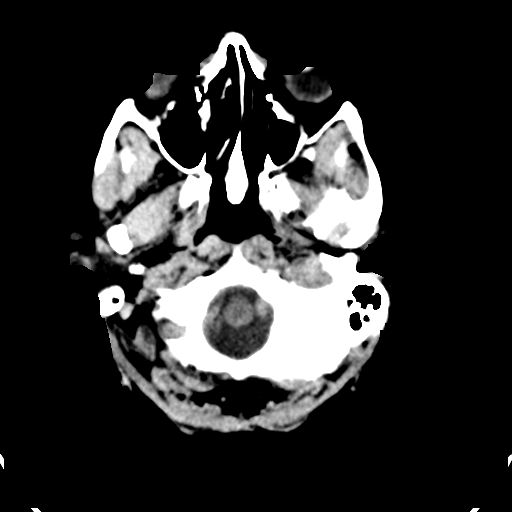
[im 3/31  bone]
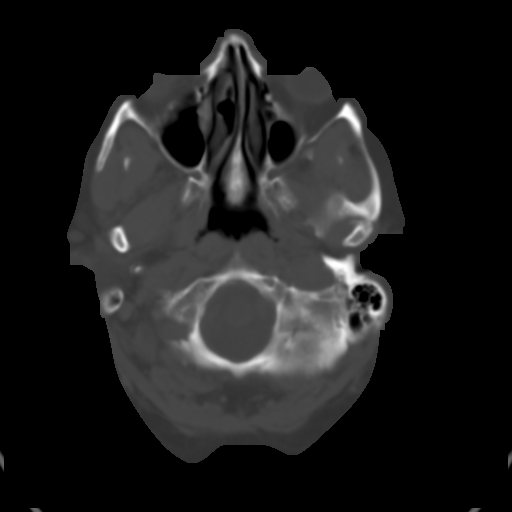
[im 6/31  brain]
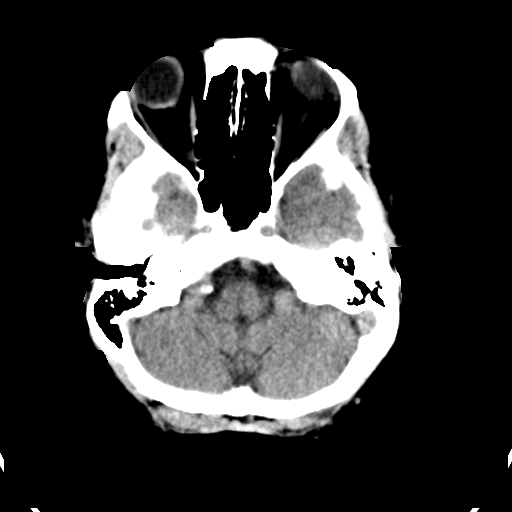
[im 9/31  brain]
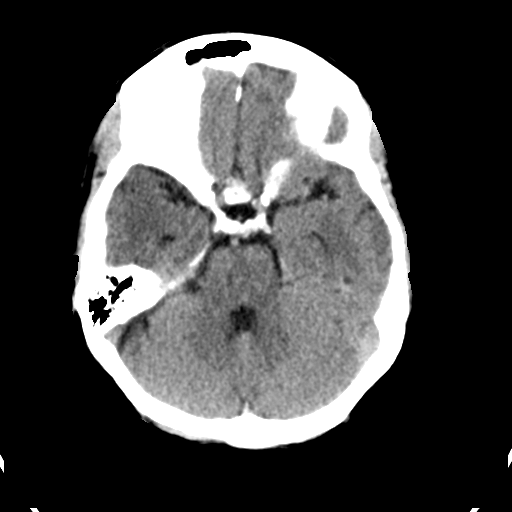
[im 11/31  brain]
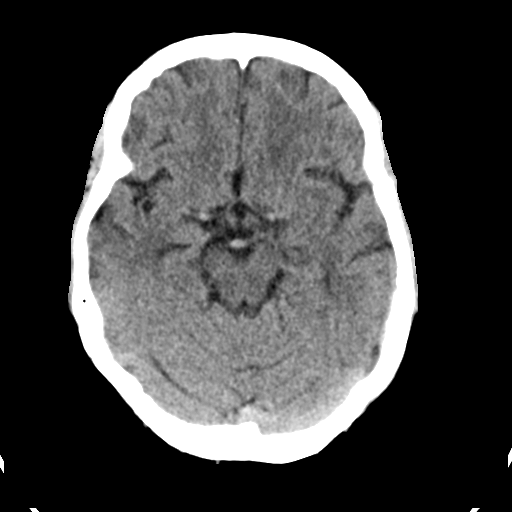
[im 14/31  brain]
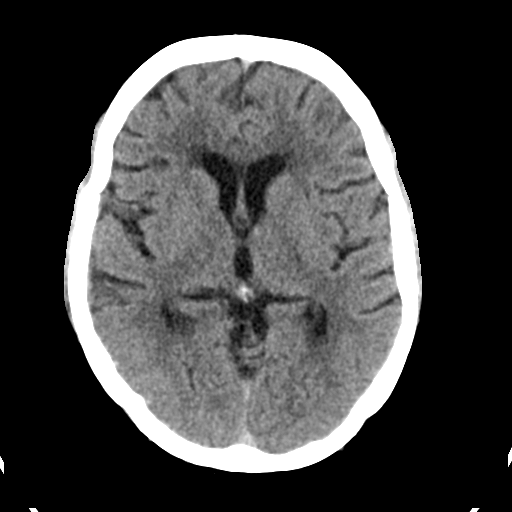
[im 14/31  bone]
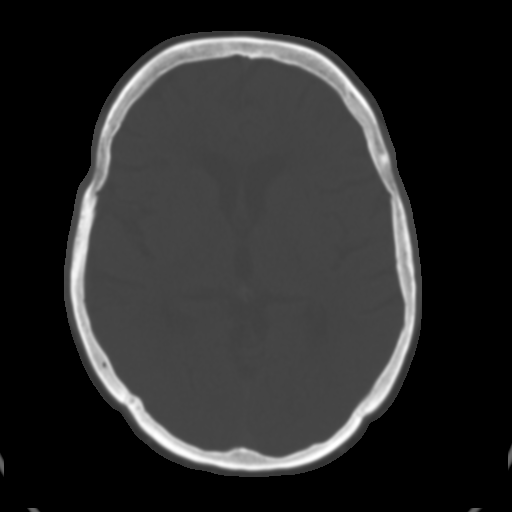
[im 17/31  brain]
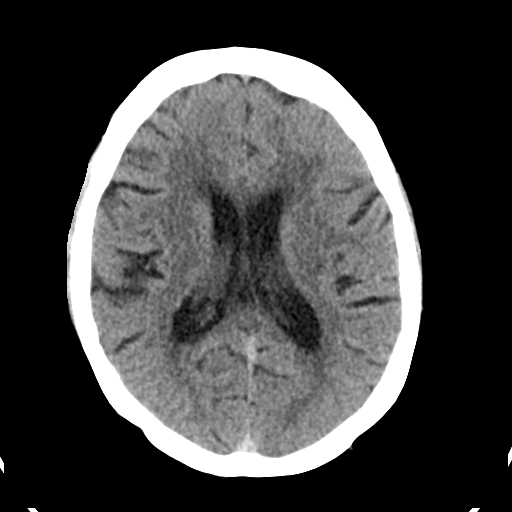
[im 20/31  brain]
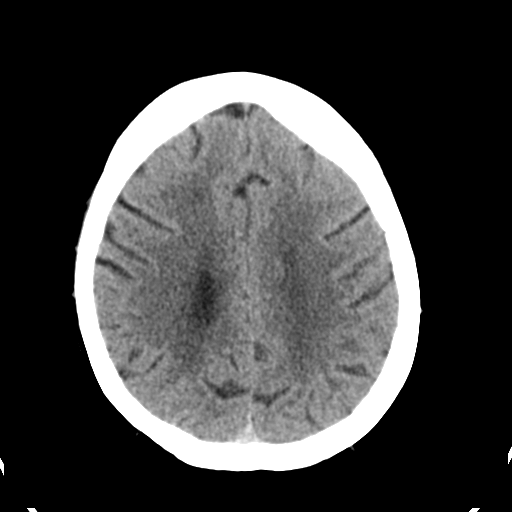
[im 23/31  brain]
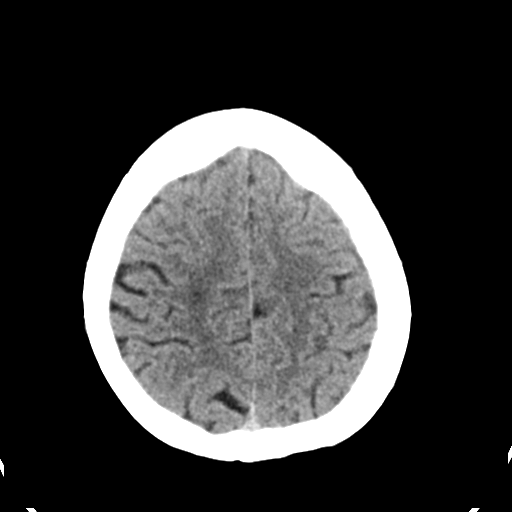
[im 25/31  brain]
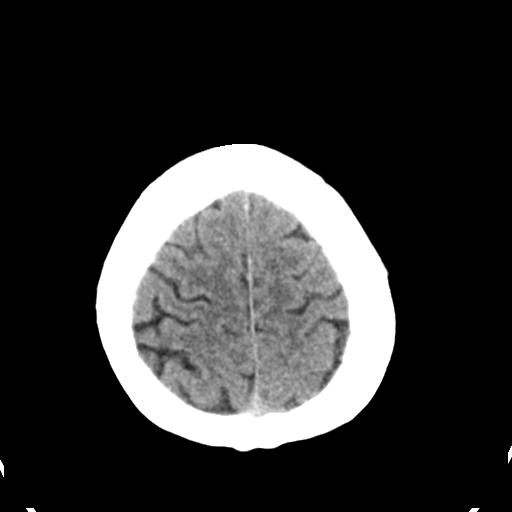
[im 25/31  bone]
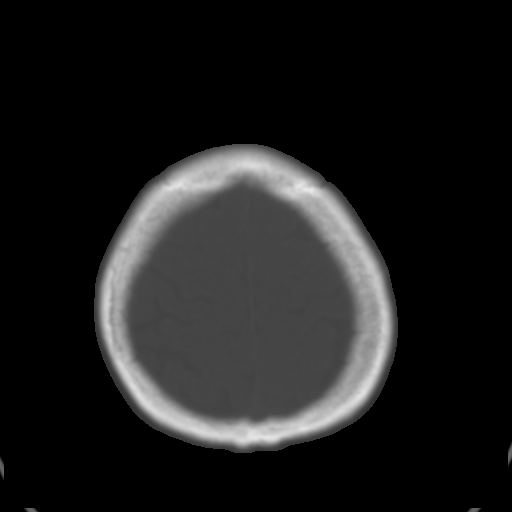
[im 28/31  brain]
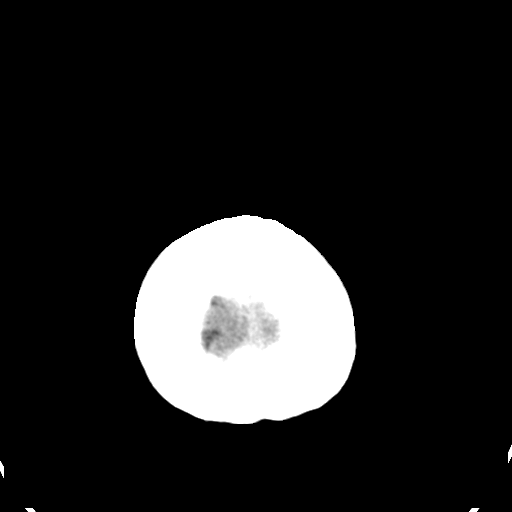

[Series 4: coronal soft tissue · coronal · 0.30mm/px · 3 of 61 slices shown]
[im 21/61  brain]
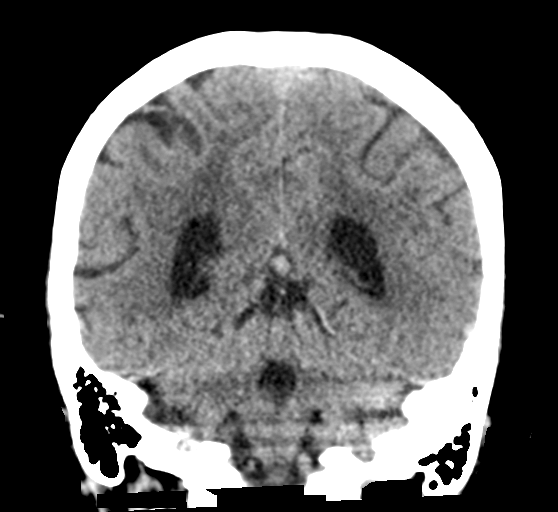
[im 27/61  brain]
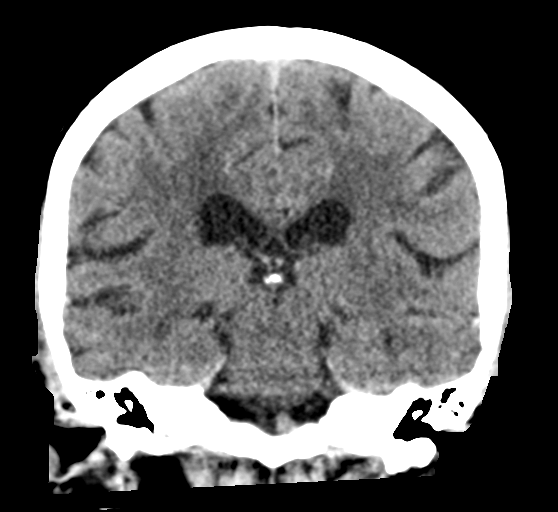
[im 34/61  brain]
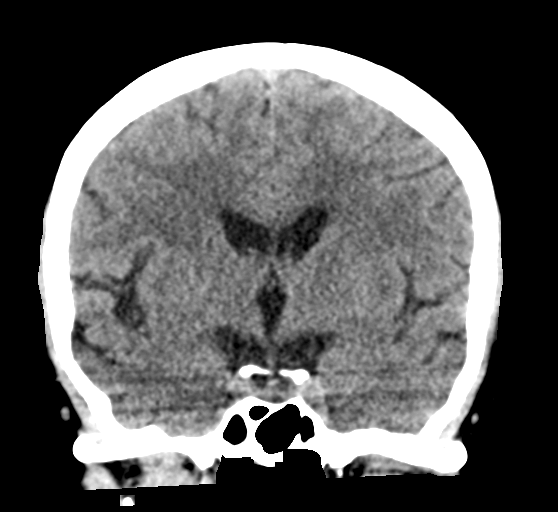

[Series 5: sagittal soft tissue · sagittal · 0.31mm/px · 3 of 55 slices shown]
[im 19/55  brain]
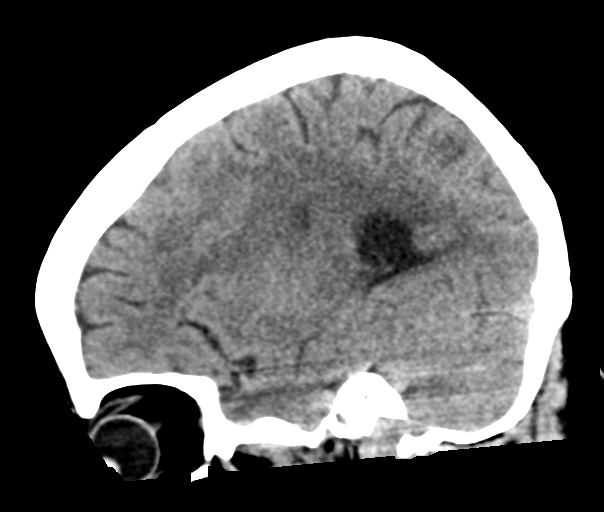
[im 28/55  brain]
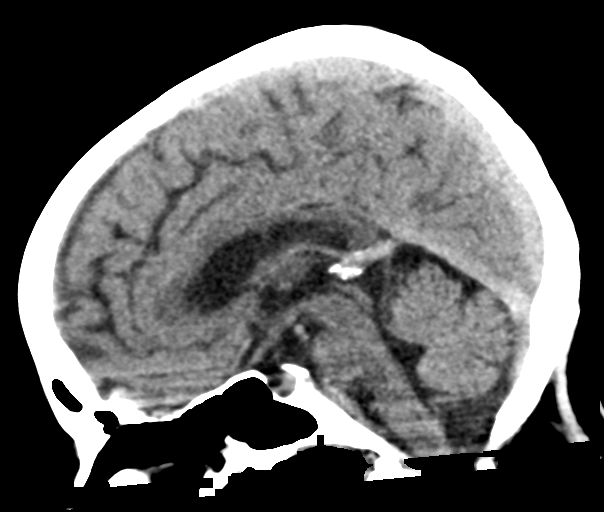
[im 37/55  brain]
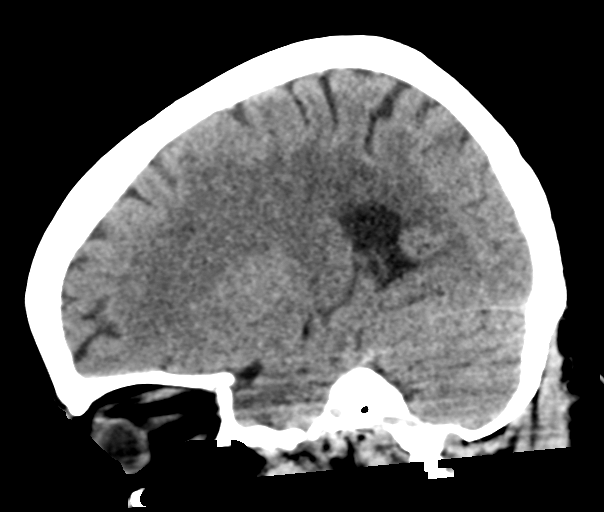

[16 of 47 positions shown; findings below may reference images not displayed]

FINDINGS: Brain: Mild cortical atrophy with progression. Negative for
hydrocephalus. Diffuse white matter hypodensity bilaterally with
progression. Focal hypodensity in the left posterior frontal deep
white matter could be an acute white matter infarct.

Negative for hemorrhage or mass.

Vascular: Negative for hyperdense vessel

Skull: Negative

Sinuses/Orbits: Paranasal sinuses clear.  Negative orbit.

Other: None
IMPRESSION: Progression of atrophy and chronic microvascular ischemic change
since [L4]

Focal 1 cm hypodensity left posterior frontal white matter
consistent with infarct possibly acute.

## 2020-07-31 MED ORDER — ACETAMINOPHEN 325 MG PO TABS: 650.0000 mg | ORAL_TABLET | ORAL | Status: DC | PRN

## 2020-07-31 MED ORDER — INSULIN ASPART 100 UNIT/ML ~~LOC~~ SOLN
7.0000 [IU] | Freq: Once | SUBCUTANEOUS | Status: AC
  Administered 2020-07-31: 7 [IU] via SUBCUTANEOUS
  Filled 2020-07-31: qty 1

## 2020-07-31 MED ORDER — INSULIN ASPART 100 UNIT/ML ~~LOC~~ SOLN
0.0000 [IU] | Freq: Three times a day (TID) | SUBCUTANEOUS | Status: DC
  Administered 2020-07-31: 7 [IU] via SUBCUTANEOUS
  Administered 2020-08-01: 4 [IU] via SUBCUTANEOUS
  Administered 2020-08-01: 20 [IU] via SUBCUTANEOUS
  Administered 2020-08-01: 11 [IU] via SUBCUTANEOUS
  Administered 2020-08-02: 15 [IU] via SUBCUTANEOUS
  Administered 2020-08-02: 3 [IU] via SUBCUTANEOUS
  Filled 2020-07-31 (×6): qty 1

## 2020-07-31 MED ORDER — ENOXAPARIN SODIUM 40 MG/0.4ML ~~LOC~~ SOLN
40.0000 mg | SUBCUTANEOUS | Status: DC
  Administered 2020-07-31 – 2020-08-01 (×2): 40 mg via SUBCUTANEOUS
  Filled 2020-07-31 (×3): qty 0.4

## 2020-07-31 MED ORDER — IOHEXOL 350 MG/ML SOLN
75.0000 mL | Freq: Once | INTRAVENOUS | Status: AC | PRN
  Administered 2020-07-31: 75 mL via INTRAVENOUS

## 2020-07-31 MED ORDER — ACETAMINOPHEN 160 MG/5ML PO SOLN
650.0000 mg | ORAL | Status: DC | PRN
  Filled 2020-07-31: qty 20.3

## 2020-07-31 MED ORDER — STROKE: EARLY STAGES OF RECOVERY BOOK: Freq: Once | Status: DC

## 2020-07-31 MED ORDER — ASPIRIN 81 MG PO CHEW
324.0000 mg | CHEWABLE_TABLET | Freq: Once | ORAL | Status: AC
  Administered 2020-07-31: 324 mg via ORAL
  Filled 2020-07-31: qty 4

## 2020-07-31 MED ORDER — HYDRALAZINE HCL 50 MG PO TABS: 25.0000 mg | ORAL_TABLET | ORAL | Status: DC | PRN

## 2020-07-31 MED ORDER — LABETALOL HCL 5 MG/ML IV SOLN
10.0000 mg | Freq: Once | INTRAVENOUS | Status: AC
  Administered 2020-07-31: 10 mg via INTRAVENOUS
  Filled 2020-07-31: qty 4

## 2020-07-31 MED ORDER — SODIUM CHLORIDE 0.9% FLUSH: 3.0000 mL | Freq: Once | INTRAVENOUS | Status: DC

## 2020-07-31 MED ORDER — INSULIN REGULAR HUMAN 100 UNIT/ML IJ SOLN
7.0000 [IU] | Freq: Once | INTRAMUSCULAR | Status: DC
  Filled 2020-07-31: qty 3

## 2020-07-31 MED ORDER — LABETALOL HCL 5 MG/ML IV SOLN
10.0000 mg | INTRAVENOUS | Status: DC | PRN
  Administered 2020-08-01: 10 mg via INTRAVENOUS
  Filled 2020-07-31: qty 4

## 2020-07-31 MED ORDER — SODIUM CHLORIDE 0.9 % IV SOLN: INTRAVENOUS | Status: DC

## 2020-07-31 MED ORDER — ACETAMINOPHEN 650 MG RE SUPP: 650.0000 mg | RECTAL | Status: DC | PRN

## 2020-07-31 NOTE — ED Provider Notes (Signed)
[facility] Emergency Department Provider Note   ____________________________________________   First MD Initiated Contact with Patient 07/31/20 1505     (approximate)  I have reviewed the triage vital signs and the nursing notes.   HISTORY  Chief Complaint Weakness    HPI Courtney Stafford is a 49 y.o. female with past medical history of hypertension and diabetes who presents to the ED complaining of weakness.  Patient reports that when she woke up yesterday morning she noticed that the right side of her body was numb and weak.  She describes some difficulty using her right arm and some imbalance on her right leg when attempting to walk.  She had issues with transportation and so did not decide to be evaluated at that time.  She states her symptoms have gradually improved since then, but when she spoke with her PCP today they advised her to come to the ED.  She denies any vision changes or speech changes, has otherwise been feeling well with no fevers, cough, chest pain, or shortness of breath.        Past Medical History:  Diagnosis Date  . Diabetes mellitus without complication (HCC)   . Hypertension     Patient Active Problem List   Diagnosis Date Noted  . Urinary frequency 05/21/2020  . Unilateral primary osteoarthritis, left knee 03/17/2020  . Left knee pain 12/11/2019  . Swelling of lower leg 12/04/2019  . Pain and swelling of lower leg, left 12/04/2019  . Insomnia 08/28/2019  . 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 HTN (hypertension) 10/22/2015  . Type 1 diabetes (HCC) 10/22/2015  . Anemia 10/22/2015  . Restless legs 10/22/2015  . Hematuria 04/09/2015    Past Surgical History:  Procedure Laterality Date  . CARPAL TUNNEL RELEASE  6 years ago   both hands    Prior to Admission medications   Medication Sig Start Date End Date Taking? Authorizing Provider  amLODipine (NORVASC) 10 MG  tablet Take 1 tablet (10 mg total) by mouth daily. 07/23/20   Iloabachie, Chioma E, NP  atorvastatin (LIPITOR) 10 MG tablet Take 1 tablet (10 mg total) by mouth daily. 01/17/20   Iloabachie, Chioma E, NP  Blood Glucose Monitoring Suppl Supplies MISC 1 each by Does not apply route 5 (five) times daily. 10/03/18   Arrie Aran, MD  carvedilol (COREG) 12.5 MG tablet Take 1 tablet (12.5 mg total) by mouth 2 (two) times daily with a meal. 07/23/20   Iloabachie, Chioma E, NP  DULoxetine (CYMBALTA) 60 MG capsule Take 1 capsule (60 mg total) by mouth every morning. 06/17/20   Iloabachie, Chioma E, NP  gabapentin (NEURONTIN) 300 MG capsule TAKE 2 CAPSULES (600MG ) BY MOUTH 2 TIMES A DAY AND 3 CAPSULES (900MG )AT NIGHT 02/26/20   Iloabachie, Chioma E, NP  hydrochlorothiazide (HYDRODIURIL) 25 MG tablet Take 1 tablet (25 mg total) by mouth daily. 07/23/20   Iloabachie, Chioma E, NP  insulin aspart (NOVOLOG FLEXPEN) 100 UNIT/ML FlexPen She states taking 5-7 units with meals and adjust with sliding scale as prescribed by Endocrinology.. 02/28/20   Iloabachie, Chioma E, NP  insulin glargine (LANTUS) 100 UNIT/ML Solostar Pen Inject 30 Units into the skin at bedtime. 04/23/20   Iloabachie, Chioma E, NP  Insulin Pen Needle (BD PEN NEEDLE NANO U/F) 32G X 4 MM MISC 5 times daily 10/04/17   06/23/20, MD  lisinopril (ZESTRIL) 40 MG tablet Take 1 tablet (40 mg total)  by mouth daily. 07/23/20   Iloabachie, Chioma E, NP  meloxicam (MOBIC) 7.5 MG tablet Take 1 tablet (7.5 mg total) by mouth daily. 07/23/20   Iloabachie, Chioma E, NP  mirtazapine (REMERON) 30 MG tablet Take 1 tablet (30 mg total) by mouth at bedtime. 06/17/20 07/17/20  Iloabachie, Chioma E, NP    Allergies Phenergan [promethazine]  Family History  Problem Relation Age of Onset  . Cancer Father   . Breast cancer Neg Hx     Social History Social History   Tobacco Use  . Smoking status: Current Some Day Smoker    Packs/day: 0.01    Types: Cigarettes  . Smokeless  tobacco: Former Neurosurgeon  . Tobacco comment: cutting back  Vaping Use  . Vaping Use: Never used  Substance Use Topics  . Alcohol use: No  . Drug use: No    Review of Systems  Constitutional: No fever/chills Eyes: No visual changes. ENT: No sore throat. Cardiovascular: Denies chest pain. Respiratory: Denies shortness of breath. Gastrointestinal: No abdominal pain.  No nausea, no vomiting.  No diarrhea.  No constipation. Genitourinary: Negative for dysuria. Musculoskeletal: Negative for back pain. Skin: Negative for rash. Neurological: Negative for headaches, positive for weakness and numbness.  ____________________________________________   PHYSICAL EXAM:  VITAL SIGNS: ED Triage Vitals  Enc Vitals Group     BP 07/31/20 1149 (!) 157/113     Pulse Rate 07/31/20 1149 95     Resp 07/31/20 1149 12     Temp 07/31/20 1149 98.7 F (37.1 C)     Temp Source 07/31/20 1149 Oral     SpO2 07/31/20 1149 94 %     Weight 07/31/20 1314 200 lb (90.7 kg)     Height 07/31/20 1314 5' (1.524 m)     Head Circumference --      Peak Flow --      Pain Score 07/31/20 1314 0     Pain Loc --      Pain Edu? --      Excl. in GC? --     Constitutional: Alert and oriented. Eyes: Conjunctivae are normal.  Pupils equal round and reactive to light bilaterally, extraocular movements intact. Head: Atraumatic. Nose: No congestion/rhinnorhea. Mouth/Throat: Mucous membranes are moist. Neck: Normal ROM Cardiovascular: Normal rate, regular rhythm. Grossly normal heart sounds. Respiratory: Normal respiratory effort.  No retractions. Lungs CTAB. Gastrointestinal: Soft and nontender. No distention. Genitourinary: deferred Musculoskeletal: No lower extremity tenderness nor edema. Neurologic:  Normal speech and language.  4 out of 5 strength in right upper and lower extremities with decreased sensation on right.  Strength otherwise intact to left upper and lower extremities. Skin:  Skin is warm, dry and intact.  No rash noted. Psychiatric: Mood and affect are normal. Speech and behavior are normal.  ____________________________________________   LABS (all labs ordered are listed, but only abnormal results are displayed)  Labs Reviewed  PROTIME-INR - Abnormal; Notable for the following components:      Result Value   Prothrombin Time 11.3 (*)    All other components within normal limits  CBC - Abnormal; Notable for the following components:   RBC 5.73 (*)    Hemoglobin 15.8 (*)    HCT 49.1 (*)    All other components within normal limits  COMPREHENSIVE METABOLIC PANEL - Abnormal; Notable for the following components:   Potassium 3.4 (*)    Glucose, Bld 281 (*)    All other components within normal limits  SARS CORONAVIRUS 2 BY  RT PCR (HOSPITAL ORDER, PERFORMED IN [facility])  APTT  DIFFERENTIAL  I-STAT CREATININE, ED  CBG MONITORING, ED  POC URINE PREG, ED   ____________________________________________  EKG  ED ECG REPORT I, Chesley Noon, the attending physician, personally viewed and interpreted this ECG.   Date: 07/31/2020  EKG Time: 13:11  Rate: 88  Rhythm: normal sinus rhythm  Axis: LAD  Intervals:none  ST&T Change: None   PROCEDURES  Procedure(s) performed (including Critical Care):  Procedures   ____________________________________________   INITIAL IMPRESSION / ASSESSMENT AND PLAN / ED COURSE       49 year old female with past medical history of hypertension and diabetes who presents to the ED complaining of right-sided numbness and weakness since waking up yesterday morning.  She does have objective weakness on the right side of her exam, vision and speech appears unaffected.  Presentation is concerning for acute stroke, however patient is outside the window for any intervention.  We will load with aspirin and allow for permissive hypertension.  Lab work and EKG are unremarkable, CT scan does show left white matter lesion consistent with  patient's deficits.  Case discussed with hospitalist for admission for further stroke work-up.      ____________________________________________   FINAL CLINICAL IMPRESSION(S) / ED DIAGNOSES  Final diagnoses:  Cerebrovascular accident (CVA), unspecified mechanism (HCC)  Right sided weakness     ED Discharge Orders    None       Note:  This document was prepared using Dragon voice recognition software and may include unintentional dictation errors.   Chesley Noon, MD 07/31/20 [phone]

## 2020-07-31 NOTE — Assessment & Plan Note (Addendum)
-   MRI confirms left caudate nucleus infarct - MRI c spine negative for cord compression - s/p permissive HTN period, will start pursuing better BP control - follow up PT eval - follow up echo: EF 60-65%, Gr 1 DD. No PFO -TSH normal, LDL 109 continue statin, A1c 7.2 better than previous ones -Continue neuro checks and monitoring for another 24 hours

## 2020-07-31 NOTE — ED Notes (Signed)
Pt CBG 400. Dr Para March messaged via secure chat, d/t pt not having insulin orders until 2200

## 2020-07-31 NOTE — ED Triage Notes (Signed)
Pt states she woke up yesterday morning and was having weakness and numbness to the right side of her body. Denies HA of vision changes. Speech is clean.. states she is having difficulty walking and holding things. No noted facial droop.

## 2020-07-31 NOTE — Assessment & Plan Note (Addendum)
-   s/p permissive htn - home meds resumed.  Pressure under better control -Continued on home regimen at discharge

## 2020-07-31 NOTE — H&P (Signed)
History and Physical    Courtney Stafford  KNL:[id number]  DOB: July 22, 1971  DOA: 07/31/2020  PCP: Courtney Gala, NP Patient coming from: home  Chief Complaint: right side weakness  HPI:  Courtney Stafford is a 49 yo CF with PMH HTN, DMII, obesity who presented to the ER with right-sided weakness to her upper and lower extremity.  She states that she went to bed in her normal state on 07/29/2020 and upon wakening up around 10 AM on 07/30/2020 she noticed right-sided weakness.  She ignored this for most of the day thinking it would improve and also spoke to her PCP.  She was ultimately recommended to present to the ER for further evaluation due to lingering symptoms of right-sided weakness.  She denied any other neurologic deficits including paresthesias, facial droop, vision changes.  She does continue to smoke, states it takes approximately 1 week for her to go through half a pack of cigarettes.  Occasional alcohol use and denies illicit drug use.  She does check her blood pressure at home and states it is controlled on her current home regimen.  In the ER, she was found to be hypertensive, 157/113 with ranges up to systolic 232/136.  She was given a dose of labetalol. CT head was obtained in the ER which revealed cerebral atrophy and chronic microvascular ischemic changes that had progressed since 2010.  There was also a 1 cm hypodensity in the left posterior frontal lobe consistent with a possible infarct.  She is admitted for further stroke work-up.   I have personally briefly reviewed patient's old medical records in [facility] and discussed patient with the ER provider when appropriate/indicated.  Assessment/Plan: CVA (cerebral vascular accident) [facility]) -Clinical presentation appears consistent with a probable acute stroke.  Also showing up on CT head which may imply that this has been going on longer than patient may have been aware however she does seem to endorse acute symptoms  yesterday morning upon waking up. -She would technically be over the 24-hour mark of permissive hypertension at this time and blood pressure remains excessively high.  Will start to slowly reduce pressure at this time -Obtain MRI brain, CTA head/neck, TTE -PT/OT/SLP eval's -Continue on fluids for now and will likely stop tomorrow -Follow-up lipid panel, TSH, A1c -Continue neuro checks  HTN (hypertension) - use labetalol/hydralazine for now; allowing slightly higher pressures 180/100 but is >24 hrs from symptom onset so does not need full permissive HTN at this time - resume home meds likely tomorrow and further adjustment as needed  Type 1 diabetes (HCC) - SSI and CBG monitoring - await med rec then resume home meds - follow up A1c   Code Status: Full DVT Prophylaxis:enoxaparin (Lovenox) 40mg  SQ 2 hours prior to surgery then every day Anticipated disposition is to Home  History: Past Medical History:  Diagnosis Date  . Diabetes mellitus without complication (HCC)   . Hypertension     Past Surgical History:  Procedure Laterality Date  . CARPAL TUNNEL RELEASE  6 years ago   both hands     reports that she has been smoking cigarettes. She has been smoking about 0.01 packs per day. She has quit using smokeless tobacco. She reports that she does not drink alcohol and does not use drugs.  Allergies  Allergen Reactions  . Phenergan [Promethazine] Hives    Family History  Problem Relation Age of Onset  . Cancer Father   . Breast cancer Neg Hx  Home Medications: Prior to Admission medications   Medication Sig Start Date End Date Taking? Authorizing Provider  amLODipine (NORVASC) 10 MG tablet Take 1 tablet (10 mg total) by mouth daily. 07/23/20   Iloabachie, Chioma E, NP  atorvastatin (LIPITOR) 10 MG tablet Take 1 tablet (10 mg total) by mouth daily. 01/17/20   Iloabachie, Chioma E, NP  Blood Glucose Monitoring Suppl Supplies MISC 1 each by Does not apply route 5 (five) times  daily. 10/03/18   Courtney Aranhir, Gauri, Stafford  carvedilol (COREG) 12.5 MG tablet Take 1 tablet (12.5 mg total) by mouth 2 (two) times daily with a meal. 07/23/20   Iloabachie, Chioma E, NP  DULoxetine (CYMBALTA) 60 MG capsule Take 1 capsule (60 mg total) by mouth every morning. 06/17/20   Iloabachie, Chioma E, NP  gabapentin (NEURONTIN) 300 MG capsule TAKE 2 CAPSULES (600MG ) BY MOUTH 2 TIMES A DAY AND 3 CAPSULES (900MG )AT NIGHT 02/26/20   Iloabachie, Chioma E, NP  hydrochlorothiazide (HYDRODIURIL) 25 MG tablet Take 1 tablet (25 mg total) by mouth daily. 07/23/20   Iloabachie, Chioma E, NP  insulin aspart (NOVOLOG FLEXPEN) 100 UNIT/ML FlexPen She states taking 5-7 units with meals and adjust with sliding scale as prescribed by Courtney Stafford.. 02/28/20   Iloabachie, Chioma E, NP  insulin glargine (LANTUS) 100 UNIT/ML Solostar Pen Inject 30 Units into the skin at bedtime. 04/23/20   Iloabachie, Chioma E, NP  Insulin Pen Needle (BD PEN NEEDLE NANO U/F) 32G X 4 MM MISC 5 times daily 10/04/17   Courtney RochesterBuse, Courtney Stafford  lisinopril (ZESTRIL) 40 MG tablet Take 1 tablet (40 mg total) by mouth daily. 07/23/20   Iloabachie, Chioma E, NP  meloxicam (MOBIC) 7.5 MG tablet Take 1 tablet (7.5 mg total) by mouth daily. 07/23/20   Iloabachie, Chioma E, NP  mirtazapine (REMERON) 30 MG tablet Take 1 tablet (30 mg total) by mouth at bedtime. 06/17/20 07/17/20  Iloabachie, Chioma E, NP    Review of Systems:  Pertinent items noted in HPI and remainder of comprehensive ROS otherwise negative.  Physical Exam: Vitals:   07/31/20 1500 07/31/20 1530 07/31/20 1600 07/31/20 1615  BP: (!) 172/111 (!) 193/144 (!) 232/136 (!) 217/113  Pulse: 87 93 98 97  Resp: (!) 37 (!) 22 (!) 24   Temp:      TempSrc:      SpO2: 100% 100% 100% 100%  Weight:      Height:       General appearance: alert, cooperative and no distress Head: Normocephalic, without obvious abnormality, atraumatic Eyes: EOMIl, PERRLA Lungs: clear to auscultation bilaterally Heart: regular rate  and rhythm and S1, S2 normal Abdomen: normal findings: bowel sounds normal, soft, non-tender and obese Extremities: no edema Skin: mobility and turgor normal Neurologic: 4/5 RUE/RLE strength; subtle dysmetria with RUE; no paresthesias; gait deferred  Labs on Admission:  I have personally reviewed following labs and imaging studies Results for orders placed or performed during the hospital encounter of 07/31/20 (from the past 24 hour(s))  Protime-INR     Status: Abnormal   Collection Time: 07/31/20  1:17 PM  Result Value Ref Range   Prothrombin Time 11.3 (L) 11.4 - 15.2 seconds   INR 0.9 0.8 - 1.2  APTT     Status: None   Collection Time: 07/31/20  1:17 PM  Result Value Ref Range   aPTT 30 24 - 36 seconds  CBC     Status: Abnormal   Collection Time: 07/31/20  1:17 PM  Result Value  Ref Range   WBC 9.1 4.0 - 10.5 K/uL   RBC 5.73 (H) 3.87 - 5.11 MIL/uL   Hemoglobin 15.8 (H) 12.0 - 15.0 g/dL   HCT 27.6 (H) 36 - 46 %   MCV 85.7 80.0 - 100.0 fL   MCH 27.6 26.0 - 34.0 pg   MCHC 32.2 30.0 - 36.0 g/dL   RDW 14.7 09.2 - 95.7 %   Platelets 336 150 - 400 K/uL   nRBC 0.0 0.0 - 0.2 %  Differential     Status: None   Collection Time: 07/31/20  1:17 PM  Result Value Ref Range   Neutrophils Relative % 75 %   Neutro Abs 6.9 1.7 - 7.7 K/uL   Lymphocytes Relative 14 %   Lymphs Abs 1.2 0.7 - 4.0 K/uL   Monocytes Relative 7 %   Monocytes Absolute 0.6 0 - 1 K/uL   Eosinophils Relative 3 %   Eosinophils Absolute 0.2 0 - 0 K/uL   Basophils Relative 1 %   Basophils Absolute 0.1 0 - 0 K/uL   Immature Granulocytes 0 %   Abs Immature Granulocytes 0.03 0.00 - 0.07 K/uL  Comprehensive metabolic panel     Status: Abnormal   Collection Time: 07/31/20  1:17 PM  Result Value Ref Range   Sodium 136 135 - 145 mmol/L   Potassium 3.4 (L) 3.5 - 5.1 mmol/L   Chloride 99 98 - 111 mmol/L   CO2 25 22 - 32 mmol/L   Glucose, Bld 281 (H) 70 - 99 mg/dL   BUN 12 6 - 20 mg/dL   Creatinine, Ser 4.73 0.44 - 1.00  mg/dL   Calcium 9.3 8.9 - 40.3 mg/dL   Total Protein 7.9 6.5 - 8.1 g/dL   Albumin 4.0 3.5 - 5.0 g/dL   AST 18 15 - 41 U/L   ALT 18 0 - 44 U/L   Alkaline Phosphatase 99 38 - 126 U/L   Total Bilirubin 0.6 0.3 - 1.2 mg/dL   GFR calc non Af Amer >60 >60 mL/min   GFR calc Af Amer >60 >60 mL/min   Anion gap 12 5 - 15  SARS Coronavirus 2 by RT PCR (hospital order, performed in [facility] hospital lab) Nasopharyngeal Nasopharyngeal Swab     Status: None   Collection Time: 07/31/20  3:44 PM   Specimen: Nasopharyngeal Swab  Result Value Ref Range   SARS Coronavirus 2 NEGATIVE NEGATIVE  Glucose, capillary     Status: Abnormal   Collection Time: 07/31/20  3:54 PM  Result Value Ref Range   Glucose-Capillary 325 (H) 70 - 99 mg/dL     Radiological Exams on Admission: CT HEAD WO CONTRAST  Result Date: 07/31/2020 CLINICAL DATA:  Mental status change. Weakness and numbness right body. EXAM: CT HEAD WITHOUT CONTRAST TECHNIQUE: Contiguous axial images were obtained from the base of the skull through the vertex without intravenous contrast. COMPARISON:  CT head 05/21/2009 FINDINGS: Brain: Mild cortical atrophy with progression. Negative for hydrocephalus. Diffuse white matter hypodensity bilaterally with progression. Focal hypodensity in the left posterior frontal deep white matter could be an acute white matter infarct. Negative for hemorrhage or mass. Vascular: Negative for hyperdense vessel Skull: Negative Sinuses/Orbits: Paranasal sinuses clear.  Negative orbit. Other: None IMPRESSION: Progression of atrophy and chronic microvascular ischemic change since 2010 Focal 1 cm hypodensity left posterior frontal white matter consistent with infarct possibly acute. Electronically Signed   By: Marlan Palau M.D.   On: 07/31/2020 13:57   CT  HEAD WO CONTRAST  Final Result      Consults called:  Neurology in am   EKG: Independently reviewed. NSR, LAD   Lewie Chamber, [facility] Pager: Secure  chat  If 7PM-7AM, please contact night-coverage [email or web address] Use universal [place] password for that web site. If you do not have the password, please call the hospital operator.  07/31/2020, 5:32 PM

## 2020-07-31 NOTE — Assessment & Plan Note (Addendum)
-   SSI and CBG monitoring - continue lantus and SSI - A1c 7.2% -Patient recommended to follow-up with PCP regarding possible adjustment of home regimen given hypoglycemia during hospitalization

## 2020-07-31 NOTE — ED Notes (Signed)
Pt assisted to the bathroom. Pt ambulated with steady gait.

## 2020-07-31 NOTE — ED Notes (Signed)
Pt given sandwich tray and ice water 

## 2020-07-31 NOTE — Hospital Course (Addendum)
Ms. Alcaide is a 49 yo CF with PMH HTN, DMII, obesity who presented to the ER with right-sided weakness to her upper and lower extremity.  She states that she went to bed in her normal state on 07/29/2020 and upon wakening up around 10 AM on 07/30/2020 she noticed right-sided weakness.  She ignored this for most of the day thinking it would improve and also spoke to her PCP.  She was ultimately recommended to present to the ER for further evaluation due to lingering symptoms of right-sided weakness.  She denied any other neurologic deficits including paresthesias, facial droop, vision changes.  She does continue to smoke, states it takes approximately 1 week for her to go through half a pack of cigarettes.  Occasional alcohol use and denies illicit drug use.  She does check her blood pressure at home and states it is controlled on her current home regimen.  In the ER, she was found to be hypertensive, 157/113 with ranges up to systolic 232/136.  She was given a dose of labetalol. CT head was obtained in the ER which revealed cerebral atrophy and chronic microvascular ischemic changes that had progressed since 2010.  There was also a 1 cm hypodensity in the left posterior frontal lobe consistent with a possible infarct.  She is admitted for further stroke work-up.  MRI brain was obtained which confirmed acute CVA involving left caudate nucleus. CTA head/neck were negative for significant stenosis.  Due to some incontinence concerns voiced to neurology, she underwent MRI of cervical spine to rule out significant stenosis. There was no obvious cord compression however she was noted to have some moderate bilateral foraminal narrowing at C5-6 and C6-7. There was also some disc protrusions at T1-2 with bilateral for minimal encroachment. She was recommended to have follow-up outpatient with spine surgery at discharge as well.   She was evaluated by neurology and started on asa/plavix for 3 weeks then aspirin  thereafter.   Her glucose levels were initially elevated but after resumed on her home insulin regimen, her glucose decreased down into the 40s-50s. She states that her levels at home are typically controlled on her regimen and she demonstrated/stated proper injection technique. Her A1c returned at 7.2%. She was instructed to discuss her insulin regimen at follow up regarding any further adjustments.

## 2020-07-31 NOTE — ED Triage Notes (Addendum)
Patient arrived by EMS from home for numbness/tingling on right side that started yesterday morning. LKW 10pm 07/29/20. EMS blood pressure 177/147 HX HTN Denies taking blood thinners. Blood sugar 84

## 2020-07-31 NOTE — ED Notes (Signed)
Pt assisted to restroom, standby assist 

## 2020-08-01 ENCOUNTER — Encounter: Payer: Self-pay | Admitting: Internal Medicine

## 2020-08-01 ENCOUNTER — Inpatient Hospital Stay: Payer: Self-pay

## 2020-08-01 ENCOUNTER — Inpatient Hospital Stay (HOSPITAL_COMMUNITY)
Admit: 2020-08-01 | Discharge: 2020-08-01 | Disposition: A | Discharge status: Home / Self Care | Payer: Self-pay | Attending: Internal Medicine | Admitting: Internal Medicine

## 2020-08-01 DIAGNOSIS — I6389 Other cerebral infarction: Secondary | ICD-10-CM

## 2020-08-01 DIAGNOSIS — I361 Nonrheumatic tricuspid (valve) insufficiency: Secondary | ICD-10-CM

## 2020-08-01 LAB — CBC WITH DIFFERENTIAL/PLATELET
Abs Immature Granulocytes: 0.02 10*3/uL (ref 0.00–0.07)
Basophils Absolute: 0.1 10*3/uL (ref 0.0–0.1)
Basophils Relative: 1 %
Eosinophils Absolute: 0.2 10*3/uL (ref 0.0–0.5)
Eosinophils Relative: 3 %
HCT: 46.3 % — ABNORMAL HIGH (ref 36.0–46.0)
Hemoglobin: 14.8 g/dL (ref 12.0–15.0)
Immature Granulocytes: 0 %
Lymphocytes Relative: 27 %
Lymphs Abs: 2.1 10*3/uL (ref 0.7–4.0)
MCH: 27.9 pg (ref 26.0–34.0)
MCHC: 32 g/dL (ref 30.0–36.0)
MCV: 87.4 fL (ref 80.0–100.0)
Monocytes Absolute: 0.8 10*3/uL (ref 0.1–1.0)
Monocytes Relative: 10 %
Neutro Abs: 4.8 10*3/uL (ref 1.7–7.7)
Neutrophils Relative %: 59 %
Platelets: 317 10*3/uL (ref 150–400)
RBC: 5.3 MIL/uL — ABNORMAL HIGH (ref 3.87–5.11)
RDW: 15.4 % (ref 11.5–15.5)
WBC: 8 10*3/uL (ref 4.0–10.5)
nRBC: 0 % (ref 0.0–0.2)

## 2020-08-01 LAB — MAGNESIUM: Magnesium: 2.4 mg/dL (ref 1.7–2.4)

## 2020-08-01 LAB — ECHOCARDIOGRAM COMPLETE
AR max vel: 1.72 cm2
AV Area VTI: 2.28 cm2
AV Area mean vel: 1.84 cm2
AV Mean grad: 5 mmHg
AV Peak grad: 8.4 mmHg
Ao pk vel: 1.45 m/s
Area-P 1/2: 7.29 cm2
Height: 60 in
S' Lateral: 2.22 cm
Weight: 3200 oz

## 2020-08-01 LAB — BASIC METABOLIC PANEL
Anion gap: 11 (ref 5–15)
BUN: 13 mg/dL (ref 6–20)
CO2: 30 mmol/L (ref 22–32)
Calcium: 9.1 mg/dL (ref 8.9–10.3)
Chloride: 99 mmol/L (ref 98–111)
Creatinine, Ser: 0.67 mg/dL (ref 0.44–1.00)
GFR calc Af Amer: >60 mL/min (ref >60)
GFR calc non Af Amer: >60 mL/min (ref >60)
Glucose, Bld: 78 mg/dL (ref 70–99)
Potassium: 3.5 mmol/L (ref 3.5–5.1)
Sodium: 140 mmol/L (ref 135–145)

## 2020-08-01 LAB — GLUCOSE, CAPILLARY
Glucose-Capillary: 88 mg/dL (ref 70–99)
Glucose-Capillary: 153 mg/dL — ABNORMAL HIGH (ref 70–99)
Glucose-Capillary: 287 mg/dL — ABNORMAL HIGH (ref 70–99)
Glucose-Capillary: 418 mg/dL — ABNORMAL HIGH (ref 70–99)
Glucose-Capillary: 169 mg/dL — ABNORMAL HIGH (ref 70–99)
Glucose-Capillary: 287 mg/dL — ABNORMAL HIGH (ref 70–99)

## 2020-08-01 IMAGING — MR MR CERVICAL SPINE W/O CM
1 series · 15 of 15 positions shown · non-contrast
Comparison: CT neck [DATE]

CLINICAL DATA: Myelopathy. Spinal stenosis. Sensory level at C4.
Incontinence and hyper reflexia.

EXAM:
MRI CERVICAL SPINE WITHOUT CONTRAST
TECHNIQUE: Multiplanar, multisequence MR imaging of the cervical spine was
performed. No intravenous contrast was administered.

[Series 5: T2 · sagittal · 3.0mm · 0.62mm/px · 15 of 15 slices shown]
[im 1/15]
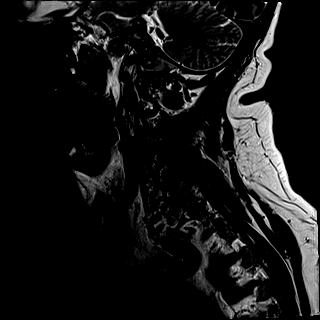
[im 2/15]
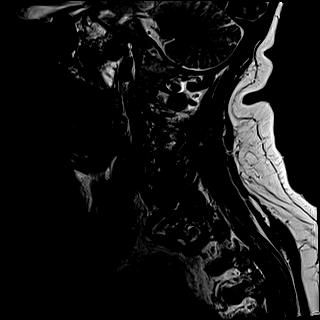
[im 3/15]
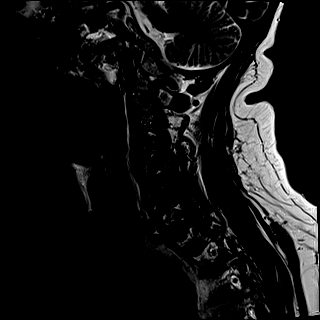
[im 4/15]
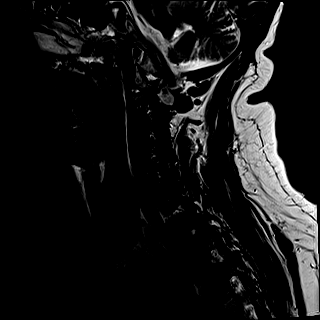
[im 5/15]
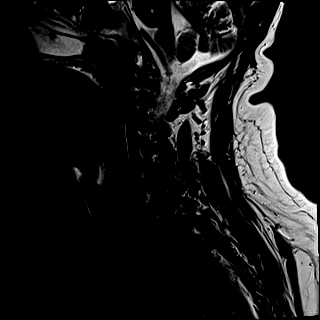
[im 6/15]
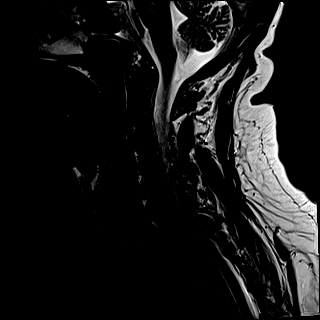
[im 7/15]
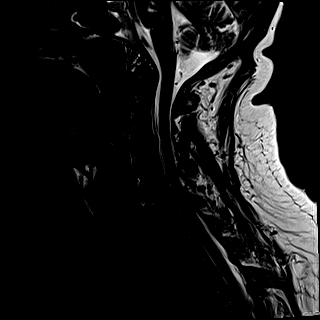
[im 8/15]
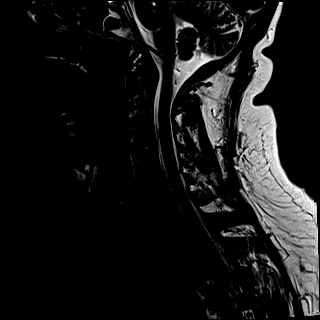
[im 9/15]
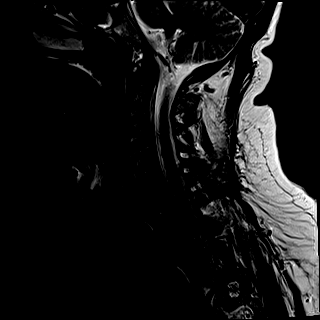
[im 10/15]
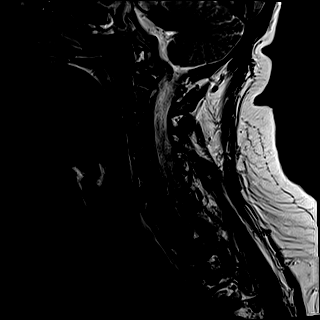
[im 11/15]
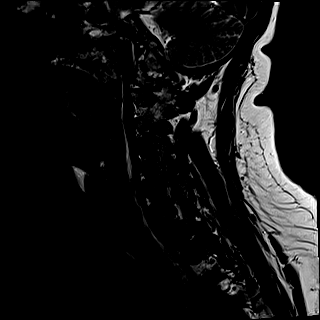
[im 12/15]
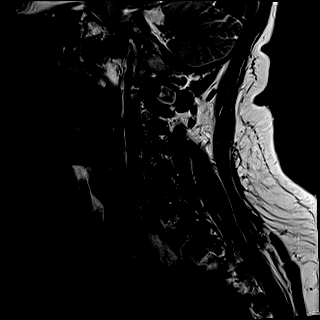
[im 13/15]
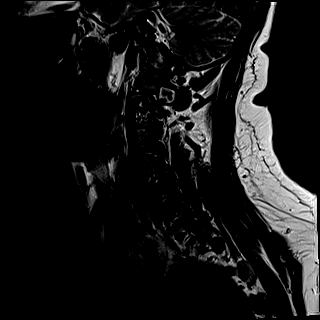
[im 14/15]
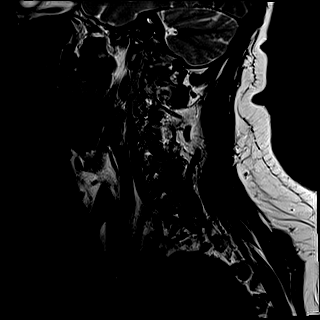
[im 15/15]
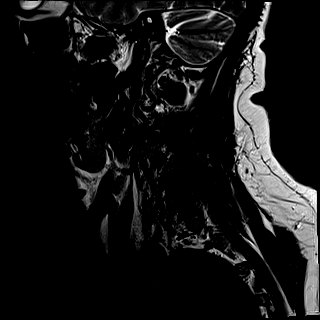

[15 of 15 positions shown; findings below may reference images not displayed]

FINDINGS: The patient would allow completion of only the sagittal T2 imaging.

Alignment: Normal

Vertebrae: No fracture or primary bone lesion.

Cord: No cord compression or primary cord lesion.

Posterior Fossa, vertebral arteries, paraspinal tissues: Old small
vessel infarctions of the pons.

Disc levels:

Foramen magnum widely patent.  C1-2 and C2-3 are normal.

C3-4: Endplate osteophytes and bulging of the disc more prominent
towards the left. No compressive canal or foraminal stenosis
suspected.

C4-5: Normal interspace.

C5-6: Spondylosis with endplate osteophytes and bulging of the disc.
Narrowing of the ventral subarachnoid space but no compression of
the cord. Moderate bilateral foraminal narrowing.

C6-7: Spondylosis with endplate osteophytes and bulging of the disc.
Narrowing of the ventral subarachnoid space but no compressive
effect upon the cord. Moderate bilateral foraminal narrowing.

C7-T1: Normal interspace.

T1-2: Bilateral posterolateral disc protrusions. No compressive
canal stenosis. Bilateral foraminal encroachment could affect either
T1 nerve.

T2-3 and T3-4: Unremarkable.
IMPRESSION: 1. The patient would allow completion of only the sagittal T2
imaging.
2. No cord compression or primary cord lesion.
3. Degenerative cervical spondylosis at C5-6 and C6-7 with moderate
bilateral foraminal narrowing.
4. Bilateral posterolateral disc protrusions at T1-2 with bilateral
foraminal encroachment that could affect either T1 nerve.

## 2020-08-01 MED ORDER — INSULIN GLARGINE 100 UNIT/ML ~~LOC~~ SOLN
30.0000 [IU] | Freq: Every day | SUBCUTANEOUS | Status: DC
  Administered 2020-08-01: 30 [IU] via SUBCUTANEOUS
  Filled 2020-08-01 (×2): qty 0.3

## 2020-08-01 MED ORDER — LABETALOL HCL 5 MG/ML IV SOLN: 10.0000 mg | INTRAVENOUS | Status: DC | PRN

## 2020-08-01 MED ORDER — HYDRALAZINE HCL 25 MG PO TABS: 25.0000 mg | ORAL_TABLET | ORAL | Status: DC | PRN

## 2020-08-01 MED ORDER — INSULIN GLARGINE 100 UNIT/ML ~~LOC~~ SOLN
20.0000 [IU] | Freq: Every day | SUBCUTANEOUS | Status: DC
  Filled 2020-08-01: qty 0.2

## 2020-08-01 MED ORDER — HYDROCHLOROTHIAZIDE 25 MG PO TABS
25.0000 mg | ORAL_TABLET | Freq: Every day | ORAL | Status: DC
  Administered 2020-08-02: 25 mg via ORAL
  Filled 2020-08-01: qty 1

## 2020-08-01 MED ORDER — CARVEDILOL 12.5 MG PO TABS
12.5000 mg | ORAL_TABLET | Freq: Two times a day (BID) | ORAL | Status: DC
  Administered 2020-08-01 – 2020-08-02 (×2): 12.5 mg via ORAL
  Filled 2020-08-01: qty 2
  Filled 2020-08-01: qty 1

## 2020-08-01 MED ORDER — LISINOPRIL 20 MG PO TABS
40.0000 mg | ORAL_TABLET | Freq: Every day | ORAL | Status: DC
  Administered 2020-08-01 – 2020-08-02 (×2): 40 mg via ORAL
  Filled 2020-08-01: qty 4
  Filled 2020-08-01: qty 2

## 2020-08-01 MED ORDER — INSULIN GLARGINE 100 UNIT/ML ~~LOC~~ SOLN
30.0000 [IU] | Freq: Every day | SUBCUTANEOUS | Status: DC
  Administered 2020-08-01: 30 [IU] via SUBCUTANEOUS
  Filled 2020-08-01: qty 0.3

## 2020-08-01 MED ORDER — ASPIRIN EC 81 MG PO TBEC
81.0000 mg | DELAYED_RELEASE_TABLET | Freq: Every day | ORAL | Status: DC
  Administered 2020-08-01 – 2020-08-02 (×2): 81 mg via ORAL
  Filled 2020-08-01 (×2): qty 1

## 2020-08-01 MED ORDER — CLOPIDOGREL BISULFATE 75 MG PO TABS
75.0000 mg | ORAL_TABLET | Freq: Every day | ORAL | Status: DC
  Administered 2020-08-01 – 2020-08-02 (×2): 75 mg via ORAL
  Filled 2020-08-01 (×2): qty 1

## 2020-08-01 MED ORDER — AMLODIPINE BESYLATE 10 MG PO TABS
10.0000 mg | ORAL_TABLET | Freq: Every day | ORAL | Status: DC
  Administered 2020-08-01 – 2020-08-02 (×2): 10 mg via ORAL
  Filled 2020-08-01: qty 1
  Filled 2020-08-01: qty 2

## 2020-08-01 MED ORDER — ATORVASTATIN CALCIUM 20 MG PO TABS
80.0000 mg | ORAL_TABLET | Freq: Every day | ORAL | Status: DC
  Administered 2020-08-01 – 2020-08-02 (×2): 80 mg via ORAL
  Filled 2020-08-01 (×2): qty 4

## 2020-08-01 NOTE — Progress Notes (Signed)
PT Cancellation Note  Patient Details Name: Courtney Stafford MRN: [id number] DOB: 11-26-1970   Cancelled Treatment:    Reason Eval/Treat Not Completed: Medical issues which prohibited therapy Attending MD requested PT eval, however neuro MD performed assessment and request to hold PT until after MRI of the neck.   Malachi Pro, DPT 08/01/2020, 10:07 AM

## 2020-08-01 NOTE — ED Notes (Signed)
Patient transported to MRI 

## 2020-08-01 NOTE — Plan of Care (Signed)
New care plan initiated 

## 2020-08-01 NOTE — Progress Notes (Signed)
OT Cancellation Note  Patient Details Name: Courtney Stafford MRN: [id number] DOB: [birth year]   Cancelled Treatment:    Reason Eval/Treat Not Completed: Patient not medically ready. Per conversation c PT, neuro MD requesting to hold therapy until after MRI of the neck. Will continue to follow and initiate services as able.   Kathie Dike, M.S. OTR/L  08/01/20, 10:26 AM  ascom [phone]

## 2020-08-01 NOTE — ED Notes (Signed)
Messaged pharmacy to please verify Lantus

## 2020-08-01 NOTE — ED Notes (Signed)
Called and ordered tray for pt.  

## 2020-08-01 NOTE — ED Notes (Signed)
Pt given juice

## 2020-08-01 NOTE — Progress Notes (Signed)
PT Cancellation Note  Patient Details Name: Courtney Stafford MRN: [id number] DOB: 04/26/1971   Cancelled Treatment:    Reason Eval/Treat Not Completed: Medical issues which prohibited therapy PT has been attempting to see pt since early this AM.  Eval has been held for various reasons.  This afternoon pt with elevated BP (187/111), glucose (418) and in conversation with MD eager to see how she does and if she is able to go home, however until situation is more controlled to hold on PT.  Pt will likely be transferred up to the floor and barring significant changes today we will likely hold PT until tomorrow AM.  Of note, this PT witnessed her walking (50+ ft) with RN earlier today with no need for AD, but had slow gait with R limp (though no overt foot drop or buckling).   Per discussion with nursing she was able to walk (even farther) to bathroom later in the day as well.   Malachi Pro, DPT 08/01/2020, 3:27 PM

## 2020-08-01 NOTE — Consult Note (Addendum)
NEUROLOGY CONSULTATION NOTE   Date of service: August 01, 2020 Patient Name: Courtney Stafford MRN:  [id number] DOB:  May 14, 1971 Reason for consult: "Stroke"  History of Present Illness  Courtney Stafford is a 49 y.o. female with PMH significant for diabetes, hypertension who presents with right-sided weakness of upper and lower extremity.  Patient went to bed on 07/30/2020 at midnight and woke up at 10 AM on 07/30/2020 with right-sided weakness.  She came to the ED upon recommendation of her PCP and when her symptoms did not improve.  Work-up in the ED with CT head without contrast which demonstrated a potential small left posterior frontal lobe infarct.  MRI brain without contrast demonstrated a small acute infarct within the posterior aspect of the left caudate nucleus and also chronic small vessel disease.  Vessel imaging with CT angio head and neck was negative for large vessel occlusion.  Does demonstrate minimal atherosclerosis with no stenosis.  She is a smoker.  She does not take aspirin at home.  Denies any family history of strokes in her parents or siblings.  Upon further questioning, patient reports that she has difficulty sensing bladder fullness, urge incontinence and incomplete emptying of her bladder very frequently.  She also endorses some constipation and does endorse a shock sensation running down from her neck into her back.  She is unsure when the symptoms started but reported that these have been going on for at least several months.  She also endorses issues with her left knee for which she is undergoing therapy. She denies any falls, no neck manipulations recently, no neck trauma.  LK W: 0000 on 07/30/2020. NIHSS: 3, sensory deficit on the R and R leg weakness. Premorbid MRS: 1 TPA: Not offered as she was out of the window.  Thrombectomy: She was outside of the window for thrombectomy.   ROS   Constitutional Denies weight loss, fever and chills.  HEENT Denies changes in  vision and hearing.  Respiratory Denies SOB and cough.  CV Denies palpitations and CP  GI Denies abdominal pain, nausea, vomiting and diarrhea.  GU Endorses dysuria and urinary frequency.  MSK Denies myalgia and joint pain.  Skin Denies rash and pruritus.  Neurological Denies headache and syncope.  Psychiatric Denies recent changes in mood. Denies anxiety and depression.   Past History   Past Medical History:  Diagnosis Date  . Diabetes mellitus without complication (HCC)   . Hypertension    Past Surgical History:  Procedure Laterality Date  . CARPAL TUNNEL RELEASE  6 years ago   both hands   Family History  Problem Relation Age of Onset  . Cancer Father   . Breast cancer Neg Hx    Social History   Socioeconomic History  . Marital status: Single    Spouse name: Not on file  . Number of children: 2  . Years of education: Not on file  . Highest education level: High school graduate  Occupational History  . Occupation: unemployed  Tobacco Use  . Smoking status: Current Some Day Smoker    Packs/day: 0.01    Types: Cigarettes  . Smokeless tobacco: Former Neurosurgeon  . Tobacco comment: cutting back  Vaping Use  . Vaping Use: Never used  Substance and Sexual Activity  . Alcohol use: No  . Drug use: No  . Sexual activity: Not on file  Other Topics Concern  . Not on file  Social History Narrative   Completed biopsychosocial assessment, social determinants screening, administered  PHQ 9, and GAD 7.       Social determinants screening completed 01/31/2020. HS She receives bus passes [facility] for transportation. She receives food stamps 192 per month and utilizes a Programme researcher, broadcasting/film/video when needed. [employer] pays for housing and utilities.  She follows up with therapist at clinic for stress, anxiety, and depression. HS      Support system is small.   Social Determinants of Health   Financial Resource Strain: Low Risk   . Difficulty of Paying Living Expenses:  Not very hard  Food Insecurity: No Food Insecurity  . Worried About Programme researcher, broadcasting/film/video in the Last Year: Never true  . Ran Out of Food in the Last Year: Never true  Transportation Needs: Unmet Transportation Needs  . Lack of Transportation (Medical): Yes  . Lack of Transportation (Non-Medical): No  Physical Activity: Insufficiently Active  . Days of Exercise per Week: 3 days  . Minutes of Exercise per Session: 40 min  Stress: Stress Concern Present  . Feeling of Stress : Rather much  Social Connections: Socially Isolated  . Frequency of Communication with Friends and Family: Never  . Frequency of Social Gatherings with Friends and Family: Never  . Attends Religious Services: Never  . Active Member of Clubs or Organizations: No  . Attends Banker Meetings: Never  . Marital Status: Never married   Allergies  Allergen Reactions  . Phenergan [Promethazine] Hives    Medications  (Not in a hospital admission)    Vitals  Temp:  [98.4 F (36.9 C)-98.7 F (37.1 C)] 98.4 F (36.9 C) (09/16 1314) Pulse Rate:  [77-98] 83 (09/17 0738) Resp:  [10-37] 18 (09/17 0738) BP: (141-232)/(94-144) 188/107 (09/17 0738) SpO2:  [94 %-100 %] 97 % (09/17 0738) Weight:  [90.7 kg] 90.7 kg (09/16 1314)  Body mass index is 39.06 kg/m.  Physical Exam   General: Laying comfortably in bed; in no acute distress.  HENT: Normal oropharynx and mucosa. Normal external appearance of ears and nose. Neck: Supple, no pain or tenderness CV: No JVD. No peripheral edema. Pulmonary: Symmetric Chest rise. Normal respiratory effort. Abdomen: Soft to touch, non-tender Ext: No cyanosis, edema, or deformity  Skin: No rash. Normal palpation of skin.   Musculoskeletal: Normal digits and nails by inspection. No clubbing.  Neurologic Examination  Mental status/Cognition: Alert, oriented to self, place, month and year, good attention. Speech/language: Fluent, comprehension intact, object naming intact,  repetition intact. Cranial nerves:   CN II Pupils equal and reactive to light, no VF deficits    CN III,IV,VI EOM intact, no gaze preference or deviation, no nystagmus    CN V normal sensation in V1, V2, and V3 segments bilaterally    CN VII no asymmetry, no nasolabial fold flattening    CN VIII normal hearing to speech    CN IX & X normal palatal elevation, no uvular deviation    CN XI 5/5 head turn and 5/5 shoulder shrug bilaterally    CN XII midline tongue protrusion    Motor:  Muscle bulk: normal, tone normal, pronator drift: yes in her RUE. Mvmt Root Nerve  Muscle Right Left Comments  SA C5/6 Ax Deltoid 4+ 4+   EF C5/6 Mc Biceps 5 5   EE C6/7/8 Rad Triceps 4 5   WF C6/7 Med FCR 5 5   WE C7/8 PIN ECU 5 5   F Ab C8/T1 U ADM/FDI 4+ 5   HF L1/2/3 Fem Illopsoas 3-  4+   KE L2/3/4 Fem Quad 4 5   DF L4/5 D Peron Tib Ant 3 5   PF S1/2 Tibial Grc/Sol 3 5    Reflexes:  Right Left Comments  Pectoralis      Biceps (C5/6) 3 3   Brachioradialis (C5/6) 3 3    Triceps (C6/7) 3 3    Patellar (L3/4) 3 3 Cross adductors +   Achilles (S1) 4 4 2-3 beats of clonus BL   Hoffman + +    Plantar withdraws withdraws   Jaw jerk    Sensation:  Light touch Intact throughout   Pin prick Sensory level at C4-5.   Temperature    Vibration   Proprioception    Coordination/Complex Motor:  - Finger to Nose intact BL - Heel to shin intact on the L, unable to test on the R due to weakness. - Rapid alternating movement slowed in RUE. - Gait: Deferred.  Labs   Lab Results  Component Value Date   NA 140 08/01/2020   K 3.5 08/01/2020   CL 99 08/01/2020   CO2 30 08/01/2020   GLUCOSE 78 08/01/2020   BUN 13 08/01/2020   CREATININE 0.67 08/01/2020   CALCIUM 9.1 08/01/2020   ALBUMIN 4.0 07/31/2020   AST 18 07/31/2020   ALT 18 07/31/2020   ALKPHOS 99 07/31/2020   BILITOT 0.6 07/31/2020   GFRNONAA >60 08/01/2020   GFRAA >60 08/01/2020     Imaging and Diagnostic studies  MRI Brain without  contrast: 1. Small acute infarct within the posterior aspect of the left caudate nucleus. No acute hemorrhage or mass effect. 2. Multifocal hyperintense T2-weighted signal within the white matter, most commonly chronic small vessel ischemia.  CT Angio head and neck: Negative for large vessel occlusion. Minimal atherosclerosis with no stenosis. Generalized arterial tortuosity.  Lab Results  Component Value Date   LDLCALC 109 (H) 07/31/2020   Lab Results  Component Value Date   HGBA1C 7.2 (H) 07/31/2020    Impression   Courtney Stafford is a 49 y.o. female with PMH significant for diabetes, hypertension who presents with right-sided weakness of upper and lower extremity. 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 Her neurologic examination is notable for diffuse hyperreflexia with + hoffman BL, R > L weakness and sensory level at C4-5. MRI Cervical spine was limited with only T2 saggital imaging but was negative for any cord compression or primary cord lesion.  The size and the location of the stroke appears to be consistent with small vessel disease.  Recommendations   - Brain imaging- MRI Brain L caudate stroke. - Vascular imaging- CTA Brain/Neck no LVO, minimal atherosclerosis. - TTE - pending - Lipid panel - LDL of 109 - Statin - increased Atorvastatin to 80mg  daily. - A1C - 7.2 - Antithrombotic - I ordered Aspirin 81mg  daily and Plavix 75mg  daily for 3 weeks, then continue Aspirin 81mg  daily after that. - DVT ppx - SQ Heparin - SBP goal - permissive hypertension first 24 h < 220/110. Hold home meds.  - Telemetry monitoring for arrythmia - Swallow screen - Stroke education - PT/OT/SLP consults - Discussed the role of good blood pressure control and glucose control and exercise in decreasing risk of strokes in the future. Discussed increased risk of stroke with tobacco use and she will work on cutting down tobacco use. - MRI Cervical spine without contrast to rule out cord  compression, syrinx taht could explain the sensory level, hyperreflexia and problems with urination.  ______________________________________________________________________   Thank you for the opportunity to take part in the care of this patient. If you have any further questions, please contact the neurology consultation attending.  Signed,  [place] Pager Number [phone]

## 2020-08-01 NOTE — ED Notes (Signed)
Admitting MD at bedside.

## 2020-08-01 NOTE — ED Notes (Signed)
Patient up to bathroom at this time.

## 2020-08-01 NOTE — Progress Notes (Signed)
Inpatient Diabetes Program Recommendations  AACE/ADA: New Consensus Statement on Inpatient Glycemic Control (2015)  Target Ranges:  Prepandial:   less than 140 mg/dL      Peak postprandial:   less than 180 mg/dL (1-2 hours)      Critically ill patients:  140 - 180 mg/dL   Lab Results  Component Value Date   GLUCAP 287 (H) 08/01/2020   HGBA1C 7.2 (H) 07/31/2020    Review of Glycemic Control Results for Courtney Stafford, Courtney Stafford (MRN [id number]) as of 08/01/2020 13:00  Ref. Range 07/31/2020 20:01 07/31/2020 23:29 08/01/2020 03:15 08/01/2020 07:27 08/01/2020 11:23  Glucose-Capillary Latest Ref Range: 70 - 99 mg/dL 619 (H) Novolog 7 units 2141 227 (H) Novolog 7 units  88 153 (H) Novolog 4 units  287 (H)   Diabetes history: DM2 Outpatient Diabetes medications: Lantus 30 units qd + Novolog 5-7 units tid meal coverage Current orders for Inpatient glycemic control: Lantus 20 units qd + Novolog resistant 0-20 units qid  Inpatient Diabetes Program Recommendations:    Noted CBG to 88 post hs correction of 7 units. -Add Novolog 4 units tid meal coverage if eats 50% -Change Novolog hs correction to 0-5 Secure chat to Dr. Lewie Chamber @ 1:09 pm with recommendations.  Thank you, Billy Fischer. Modestine Scherzinger, RN, MSN, CDE  Diabetes Coordinator Inpatient Glycemic Control Team Team Pager [phone] (8am-5pm) 08/01/2020 1:08 PM

## 2020-08-01 NOTE — Progress Notes (Signed)
SLP Cancellation Note  Patient Details Name: Courtney Stafford MRN: [id number] DOB: 1971/06/14   Cancelled treatment:       Reason Eval/Treat Not Completed: SLP screened, no needs identified, will sign off   Chart reviewed and skilled observation of pt while she was with Neurologist and talking with this therapist. At this time, pt's doesn't any acute cognitive linguistic deficits. ST will discharge orders.   Courtney Stafford B. Dreama Saa M.S., CCC-SLP, [facility] Speech-Language Pathologist Rehabilitation Services Office [phone]    Courtney Stafford 08/01/2020, 9:59 AM

## 2020-08-01 NOTE — ED Notes (Signed)
Neurology at bedside.

## 2020-08-01 NOTE — ED Notes (Signed)
Pt assisted to bathroom

## 2020-08-01 NOTE — Progress Notes (Signed)
PROGRESS NOTE    Courtney Stafford   ZOX:[id number]  DOB: 01/03/1971  DOA: 07/31/2020     1  PCP: Rolm Gala, NP  CC: right side weakness  Hospital Course: Ms. Courtney Stafford is a 49 yo CF with PMH HTN, DMII, obesity who presented to the ER with right-sided weakness to her upper and lower extremity.  She states that she went to bed in her normal state on 07/29/2020 and upon wakening up around 10 AM on 07/30/2020 she noticed right-sided weakness.  She ignored this for most of the day thinking it would improve and also spoke to her PCP.  She was ultimately recommended to present to the ER for further evaluation due to lingering symptoms of right-sided weakness.  She denied any other neurologic deficits including paresthesias, facial droop, vision changes.  She does continue to smoke, states it takes approximately 1 week for her to go through half a pack of cigarettes.  Occasional alcohol use and denies illicit drug use.  She does check her blood pressure at home and states it is controlled on her current home regimen.  In the ER, she was found to be hypertensive, 157/113 with ranges up to systolic 232/136.  She was given a dose of labetalol. CT head was obtained in the ER which revealed cerebral atrophy and chronic microvascular ischemic changes that had progressed since 2010.  There was also a 1 cm hypodensity in the left posterior frontal lobe consistent with a possible infarct.  She is admitted for further stroke work-up.  MRI brain was obtained which confirmed acute CVA involving left caudate nucleus. CTA head/neck were negative for significant stenosis.  Due to some incontinence concerns voiced to neurology, she underwent MRI of cervical spine to rule out significant stenosis. There was no obvious cord compression however she was noted to have some moderate bilateral foraminal narrowing at C5-6 and C6-7. There was also some disc protrusions at T1-2 with bilateral for minimal  encroachment. She was recommended to have follow-up outpatient with   Interval History:  No events overnight.  Seen on stretcher in ER waiting on a room.  Still having right-sided weakness but has been able to ambulate relatively well.  Blood pressure remains elevated this morning as well as glucose.  Old records reviewed in assessment of this patient  ROS: Constitutional: negative for chills and fevers, Respiratory: negative for cough, Cardiovascular: negative for chest pain, Gastrointestinal: negative for abdominal pain and Neurological: positive for weakness  Assessment & Plan: CVA (cerebral vascular accident) (HCC) - MRI confirms left caudate nucleus infarct - MRI c spine negative for cord compression - s/p permissive HTN period, will start pursuing better BP control - follow up PT eval - follow up echo: EF 60-65%, Gr 1 DD. No PFO -TSH normal, LDL 109 continue statin, A1c 7.2 better than previous ones -Continue neuro checks and monitoring for another 24 hours  HTN (hypertension) -Severely elevated pressures not unexpected -Permissive hypertension period satisfied.  We will start to further de-escalate blood pressure -Resume home regimen -Continue labetalol and hydralazine as needed  (parameters modified)  Type 1 diabetes (HCC) - SSI and CBG monitoring - continue lantus and SSI - A1c 7.2%   Antimicrobials: none  DVT prophylaxis: Lovenox Code Status: Full Family Communication: none Disposition Plan: Status is: Inpatient  Remains inpatient appropriate because:Unsafe d/c plan, IV treatments appropriate due to intensity of illness or inability to take PO and Inpatient level of care appropriate due to severity of illness  Dispo: The patient is from: Home              Anticipated d/c is to: Home              Anticipated d/c date is: 1 day              Patient currently is not medically stable to d/c.       Objective: Blood pressure (!) 189/106, pulse 98,  temperature 98.4 F (36.9 C), temperature source Oral, resp. rate 18, height 5' (1.524 m), weight 90.7 kg, last menstrual period 01/03/2016, SpO2 98 %.  Examination: General appearance: alert, cooperative and no distress Head: Normocephalic, without obvious abnormality, atraumatic Eyes: EOMIl, PERRLA Lungs: clear to auscultation bilaterally Heart: regular rate and rhythm and S1, S2 normal Abdomen: normal findings: bowel sounds normal, soft, non-tender and obese Extremities: no edema Skin: mobility and turgor normal Neurologic: 4/5 RUE/RLE strength; subtle dysmetria with RUE; no paresthesias; gait deferred  Consultants:   Neurology   Data Reviewed: I have personally reviewed following labs and imaging studies Results for orders placed or performed during the hospital encounter of 07/31/20 (from the past 24 hour(s))  Glucose, capillary     Status: Abnormal   Collection Time: 07/31/20  8:01 PM  Result Value Ref Range   Glucose-Capillary 400 (H) 70 - 99 mg/dL  Glucose, capillary     Status: Abnormal   Collection Time: 07/31/20 11:29 PM  Result Value Ref Range   Glucose-Capillary 227 (H) 70 - 99 mg/dL  Glucose, capillary     Status: None   Collection Time: 08/01/20  3:15 AM  Result Value Ref Range   Glucose-Capillary 88 70 - 99 mg/dL  Basic metabolic panel     Status: None   Collection Time: 08/01/20  3:25 AM  Result Value Ref Range   Sodium 140 135 - 145 mmol/L   Potassium 3.5 3.5 - 5.1 mmol/L   Chloride 99 98 - 111 mmol/L   CO2 30 22 - 32 mmol/L   Glucose, Bld 78 70 - 99 mg/dL   BUN 13 6 - 20 mg/dL   Creatinine, Ser 1.61 0.44 - 1.00 mg/dL   Calcium 9.1 8.9 - 09.6 mg/dL   GFR calc non Af Amer >60 >60 mL/min   GFR calc Af Amer >60 >60 mL/min   Anion gap 11 5 - 15  CBC with Differential/Platelet     Status: Abnormal   Collection Time: 08/01/20  3:25 AM  Result Value Ref Range   WBC 8.0 4.0 - 10.5 K/uL   RBC 5.30 (H) 3.87 - 5.11 MIL/uL   Hemoglobin 14.8 12.0 - 15.0 g/dL    HCT 04.5 (H) 36 - 46 %   MCV 87.4 80.0 - 100.0 fL   MCH 27.9 26.0 - 34.0 pg   MCHC 32.0 30.0 - 36.0 g/dL   RDW 40.9 81.1 - 91.4 %   Platelets 317 150 - 400 K/uL   nRBC 0.0 0.0 - 0.2 %   Neutrophils Relative % 59 %   Neutro Abs 4.8 1.7 - 7.7 K/uL   Lymphocytes Relative 27 %   Lymphs Abs 2.1 0.7 - 4.0 K/uL   Monocytes Relative 10 %   Monocytes Absolute 0.8 0 - 1 K/uL   Eosinophils Relative 3 %   Eosinophils Absolute 0.2 0 - 0 K/uL   Basophils Relative 1 %   Basophils Absolute 0.1 0 - 0 K/uL   Immature Granulocytes 0 %   Abs Immature Granulocytes  0.02 0.00 - 0.07 K/uL  Magnesium     Status: None   Collection Time: 08/01/20  3:25 AM  Result Value Ref Range   Magnesium 2.4 1.7 - 2.4 mg/dL  Glucose, capillary     Status: Abnormal   Collection Time: 08/01/20  7:27 AM  Result Value Ref Range   Glucose-Capillary 153 (H) 70 - 99 mg/dL  Glucose, capillary     Status: Abnormal   Collection Time: 08/01/20 11:23 AM  Result Value Ref Range   Glucose-Capillary 287 (H) 70 - 99 mg/dL   Comment 1 Document in Chart   Glucose, capillary     Status: Abnormal   Collection Time: 08/01/20  2:39 PM  Result Value Ref Range   Glucose-Capillary 418 (H) 70 - 99 mg/dL   Comment 1 Notify RN     Recent Results (from the past 240 hour(s))  SARS Coronavirus 2 by RT PCR (hospital order, performed in [facility] hospital lab) Nasopharyngeal Nasopharyngeal Swab     Status: None   Collection Time: 07/31/20  3:44 PM   Specimen: Nasopharyngeal Swab  Result Value Ref Range Status   SARS Coronavirus 2 NEGATIVE NEGATIVE Final    Comment: (NOTE) SARS-CoV-2 target nucleic acids are NOT DETECTED.  The SARS-CoV-2 RNA is generally detectable in upper and lower respiratory specimens during the acute phase of infection. The lowest concentration of SARS-CoV-2 viral copies this assay can detect is 250 copies / mL. A negative result does not preclude SARS-CoV-2 infection and should not be used as the sole basis for  treatment or other patient management decisions.  A negative result may occur with improper specimen collection / handling, submission of specimen other than nasopharyngeal swab, presence of viral mutation(s) within the areas targeted by this assay, and inadequate number of viral copies (<250 copies / mL). A negative result must be combined with clinical observations, patient history, and epidemiological information.  Fact Sheet for Patients:   BoilerBrush.com.cy  Fact Sheet for Healthcare Providers: https://pope.com/  This test is not yet approved or  cleared by the [place] FDA and has been authorized for detection and/or diagnosis of SARS-CoV-2 by FDA under an Emergency Use Authorization (EUA).  This EUA will remain in effect (meaning this test can be used) for the duration of the COVID-19 declaration under Section 564(b)(1) of the Act, 21 U.S.C. section 360bbb-3(b)(1), unless the authorization is terminated or revoked sooner.  Performed at [facility], [address] [place], [place] [address]      Radiology Studies: CT ANGIO HEAD W OR WO CONTRAST  Result Date: 07/31/2020 CLINICAL DATA:  49 year old female with altered mental status and right side symptoms with appearance suspicious for acute small vessel infarct in the left corona radiata on plain head CT today. EXAM: CT ANGIOGRAPHY HEAD AND NECK TECHNIQUE: Multidetector CT imaging of the head and neck was performed using the standard protocol during bolus administration of intravenous contrast. Multiplanar CT image reconstructions and MIPs were obtained to evaluate the vascular anatomy. Carotid stenosis measurements (when applicable) are obtained utilizing NASCET criteria, using the distal internal carotid diameter as the denominator. CONTRAST:  48mL OMNIPAQUE IOHEXOL 350 MG/ML SOLN COMPARISON:  Plain head CT 1341 hours today. FINDINGS: CTA NECK Skeleton: No acute  osseous abnormality identified. Upper chest: Negative. Other neck: Negative. Aortic arch: 3 vessel arch configuration with tortuous great vessels. No arch atherosclerosis. Right carotid system: Negative aside from tortuous brachiocephalic artery, cervical right ICA. Left carotid system: Negative aside from tortuous left  CCA and cervical left ICA. Vertebral arteries: Normal proximal right subclavian artery. The right vertebral artery has an early origin on series 6, image 268, and then the right vertebral has a late entry into the cervical transverse foramen (normal variant). The right vertebral is also mildly non dominant and normal to the skull base. Tortuous proximal left subclavian artery without stenosis. Dominant left vertebral artery has a normal origin. The left V2 segment is tortuous. The left vertebral remains widely patent to the skull base. CTA HEAD Posterior circulation: Dominant left V4 segment. Mild distal vertebral ectasias a. No distal vertebral plaque or stenosis. Normal right PICA and dominant appearing left AICA origins. Normal vertebrobasilar junction and basilar artery. Patent SCA and PCA origins. Both posterior communicating arteries are present. Bilateral PCA branches are within normal limits. Anterior circulation: Trace gas in the left cavernous sinus is likely intravenous access related. Both ICA siphons are patent with only minimal plaque. No ICA siphon stenosis. Ophthalmic and posterior communicating artery origins appear normal. Patent carotid termini. Normal MCA and ACA origins. Left A1 segment is mildly dominant. Anterior communicating artery is normal. The right A2 is dominant, somewhat simulating azygos ACA anatomy. ACA branches are within normal limits. Left MCA M1 segment and bifurcation are patent without stenosis. Right MCA M1 segment bifurcates early without stenosis. Bilateral MCA branches are within normal limits. Venous sinuses: Patent. Anatomic variants: Dominant left  vertebral artery. Early origin of the right vertebral. Dominant left A1 and right A2 segments. Review of the MIP images confirms the above findings IMPRESSION: Negative for large vessel occlusion. Minimal atherosclerosis with no stenosis. Generalized arterial tortuosity. Electronically Signed   By: Odessa Fleming M.D.   On: 07/31/2020 21:17   CT HEAD WO CONTRAST  Result Date: 07/31/2020 CLINICAL DATA:  Mental status change. Weakness and numbness right body. EXAM: CT HEAD WITHOUT CONTRAST TECHNIQUE: Contiguous axial images were obtained from the base of the skull through the vertex without intravenous contrast. COMPARISON:  CT head 05/21/2009 FINDINGS: Brain: Mild cortical atrophy with progression. Negative for hydrocephalus. Diffuse white matter hypodensity bilaterally with progression. Focal hypodensity in the left posterior frontal deep white matter could be an acute white matter infarct. Negative for hemorrhage or mass. Vascular: Negative for hyperdense vessel Skull: Negative Sinuses/Orbits: Paranasal sinuses clear.  Negative orbit. Other: None IMPRESSION: Progression of atrophy and chronic microvascular ischemic change since 2010 Focal 1 cm hypodensity left posterior frontal white matter consistent with infarct possibly acute. Electronically Signed   By: Marlan Palau M.D.   On: 07/31/2020 13:57   CT ANGIO NECK W OR WO CONTRAST  Result Date: 07/31/2020 CLINICAL DATA:  49 year old female with altered mental status and right side symptoms with appearance suspicious for acute small vessel infarct in the left corona radiata on plain head CT today. EXAM: CT ANGIOGRAPHY HEAD AND NECK TECHNIQUE: Multidetector CT imaging of the head and neck was performed using the standard protocol during bolus administration of intravenous contrast. Multiplanar CT image reconstructions and MIPs were obtained to evaluate the vascular anatomy. Carotid stenosis measurements (when applicable) are obtained utilizing NASCET criteria,  using the distal internal carotid diameter as the denominator. CONTRAST:  70mL OMNIPAQUE IOHEXOL 350 MG/ML SOLN COMPARISON:  Plain head CT 1341 hours today. FINDINGS: CTA NECK Skeleton: No acute osseous abnormality identified. Upper chest: Negative. Other neck: Negative. Aortic arch: 3 vessel arch configuration with tortuous great vessels. No arch atherosclerosis. Right carotid system: Negative aside from tortuous brachiocephalic artery, cervical right ICA. Left carotid system: Negative  aside from tortuous left CCA and cervical left ICA. Vertebral arteries: Normal proximal right subclavian artery. The right vertebral artery has an early origin on series 6, image 268, and then the right vertebral has a late entry into the cervical transverse foramen (normal variant). The right vertebral is also mildly non dominant and normal to the skull base. Tortuous proximal left subclavian artery without stenosis. Dominant left vertebral artery has a normal origin. The left V2 segment is tortuous. The left vertebral remains widely patent to the skull base. CTA HEAD Posterior circulation: Dominant left V4 segment. Mild distal vertebral ectasias a. No distal vertebral plaque or stenosis. Normal right PICA and dominant appearing left AICA origins. Normal vertebrobasilar junction and basilar artery. Patent SCA and PCA origins. Both posterior communicating arteries are present. Bilateral PCA branches are within normal limits. Anterior circulation: Trace gas in the left cavernous sinus is likely intravenous access related. Both ICA siphons are patent with only minimal plaque. No ICA siphon stenosis. Ophthalmic and posterior communicating artery origins appear normal. Patent carotid termini. Normal MCA and ACA origins. Left A1 segment is mildly dominant. Anterior communicating artery is normal. The right A2 is dominant, somewhat simulating azygos ACA anatomy. ACA branches are within normal limits. Left MCA M1 segment and bifurcation are  patent without stenosis. Right MCA M1 segment bifurcates early without stenosis. Bilateral MCA branches are within normal limits. Venous sinuses: Patent. Anatomic variants: Dominant left vertebral artery. Early origin of the right vertebral. Dominant left A1 and right A2 segments. Review of the MIP images confirms the above findings IMPRESSION: Negative for large vessel occlusion. Minimal atherosclerosis with no stenosis. Generalized arterial tortuosity. Electronically Signed   By: Odessa Fleming M.D.   On: 07/31/2020 21:17   MR BRAIN WO CONTRAST  Result Date: 07/31/2020 CLINICAL DATA:  Right-sided weakness EXAM: MRI HEAD WITHOUT CONTRAST TECHNIQUE: Multiplanar, multiecho pulse sequences of the brain and surrounding structures were obtained without intravenous contrast. COMPARISON:  None. FINDINGS: Brain: There is a small acute infarct within the posterior aspect of the left caudate. No other diffusion abnormality. No acute hemorrhage. Multifocal hyperintense T2-weighted signal within the white matter. Normal volume of CSF spaces. Single focus of chronic microhemorrhage in the left pons. Normal midline structures. Vascular: Normal flow voids. Skull and upper cervical spine: Normal marrow signal. Sinuses/Orbits: Negative. Other: None. IMPRESSION: 1. Small acute infarct within the posterior aspect of the left caudate nucleus. No acute hemorrhage or mass effect. 2. Multifocal hyperintense T2-weighted signal within the white matter, most commonly chronic small vessel ischemia. Electronically Signed   By: Deatra Robinson M.D.   On: 07/31/2020 21:46   MR CERVICAL SPINE WO CONTRAST  Result Date: 08/01/2020 CLINICAL DATA:  Myelopathy. Spinal stenosis. Sensory level at C4. Incontinence and hyper reflexia. EXAM: MRI CERVICAL SPINE WITHOUT CONTRAST TECHNIQUE: Multiplanar, multisequence MR imaging of the cervical spine was performed. No intravenous contrast was administered. COMPARISON:  CT neck 07/31/2020 FINDINGS: The patient  would allow completion of only the sagittal T2 imaging. Alignment: Normal Vertebrae: No fracture or primary bone lesion. Cord: No cord compression or primary cord lesion. Posterior Fossa, vertebral arteries, paraspinal tissues: Old small vessel infarctions of the pons. Disc levels: Foramen magnum widely patent.  C1-2 and C2-3 are normal. C3-4: Endplate osteophytes and bulging of the disc more prominent towards the left. No compressive canal or foraminal stenosis suspected. C4-5: Normal interspace. C5-6: Spondylosis with endplate osteophytes and bulging of the disc. Narrowing of the ventral subarachnoid space but no compression of the  cord. Moderate bilateral foraminal narrowing. C6-7: Spondylosis with endplate osteophytes and bulging of the disc. Narrowing of the ventral subarachnoid space but no compressive effect upon the cord. Moderate bilateral foraminal narrowing. C7-T1: Normal interspace. T1-2: Bilateral posterolateral disc protrusions. No compressive canal stenosis. Bilateral foraminal encroachment could affect either T1 nerve. T2-3 and T3-4: Unremarkable. IMPRESSION: 1. The patient would allow completion of only the sagittal T2 imaging. 2. No cord compression or primary cord lesion. 3. 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 4. Bilateral posterolateral disc protrusions at T1-2 with bilateral foraminal encroachment that could affect either T1 nerve. Electronically Signed   By: Paulina FusiMark  Shogry M.D.   On: 08/01/2020 11:27   ECHOCARDIOGRAM COMPLETE  Result Date: 08/01/2020    ECHOCARDIOGRAM REPORT   Patient Name:   Egbert GaribaldiNGELA M Morabito Date of Exam: 08/01/2020 Medical Rec #:  [id number]        Height:       60.0 in Accession #:    [id number]       Weight:       200.0 lb Date of Birth:  04-Feb-1971        BSA:          1.867 m Patient Age:    49 years         BP:           159/114 mmHg Patient Gender: F                HR:           82 bpm. Exam Location:  [facility]  Procedure: 2D Echo, Cardiac Doppler and Color Doppler Indications:     Stroke 434.91  History:         Patient has no prior history of Echocardiogram examinations.                  Risk Factors:Hypertension and Diabetes.  Sonographer:     Cristela BlueJerry Hege RDCS (AE) Referring Phys:  [phone] Shelbie Franken Frederick PeersGIRGUIS Diagnosing Phys: Julien Nordmannimothy Gollan MD IMPRESSIONS  1. Left ventricular ejection fraction, by estimation, is 60 to 65%. The left ventricle has normal function. The left ventricle has no regional wall motion abnormalities. Left ventricular diastolic parameters are consistent with Grade I diastolic dysfunction (impaired relaxation).  2. Right ventricular systolic function is normal. The right ventricular size is normal. There is normal pulmonary artery systolic pressure. FINDINGS  Left Ventricle: Left ventricular ejection fraction, by estimation, is 60 to 65%. The left ventricle has normal function. The left ventricle has no regional wall motion abnormalities. The left ventricular internal cavity size was normal in size. There is  no left ventricular hypertrophy. Left ventricular diastolic parameters are consistent with Grade I diastolic dysfunction (impaired relaxation). Right Ventricle: The right ventricular size is normal. No increase in right ventricular wall thickness. Right ventricular systolic function is normal. There is normal pulmonary artery systolic pressure. The tricuspid regurgitant velocity is 2.04 m/s, and  with an assumed right atrial pressure of 5 mmHg, the estimated right ventricular systolic pressure is 21.6 mmHg. Left Atrium: Left atrial size was normal in size. Right Atrium: Right atrial size was normal in size. Pericardium: There is no evidence of pericardial effusion. Mitral Valve: The mitral valve is normal in structure. No evidence of mitral valve regurgitation. No evidence of mitral valve stenosis. Tricuspid Valve: The tricuspid valve is not well visualized. Tricuspid valve regurgitation is mild . No  evidence of tricuspid stenosis. Aortic Valve: The aortic  valve was not well visualized. Aortic valve regurgitation is not visualized. No aortic stenosis is present. Aortic valve mean gradient measures 5.0 mmHg. Aortic valve peak gradient measures 8.4 mmHg. Aortic valve area, by VTI measures 2.28 cm. Pulmonic Valve: The pulmonic valve was normal in structure. Pulmonic valve regurgitation is not visualized. No evidence of pulmonic stenosis. Aorta: The aortic root is normal in size and structure. Venous: The inferior vena cava is normal in size with greater than 50% respiratory variability, suggesting right atrial pressure of 3 mmHg. IAS/Shunts: No atrial level shunt detected by color flow Doppler.  LEFT VENTRICLE PLAX 2D LVIDd:         3.88 cm  Diastology LVIDs:         2.22 cm  LV e' medial:    7.18 cm/s LV PW:         1.07 cm  LV E/e' medial:  10.4 LV IVS:        0.97 cm  LV e' lateral:   6.85 cm/s LVOT diam:     2.00 cm  LV E/e' lateral: 10.9 LV SV:         65 LV SV Index:   35 LVOT Area:     3.14 cm  RIGHT VENTRICLE RV Basal diam:  2.57 cm RV S prime:     14.60 cm/s TAPSE (M-mode): 3.6 cm LEFT ATRIUM             Index       RIGHT ATRIUM           Index LA diam:        2.60 cm 1.39 cm/m  RA Area:     12.30 cm LA Vol (A2C):   33.5 ml 17.95 ml/m RA Volume:   25.90 ml  13.88 ml/m LA Vol (A4C):   39.3 ml 21.05 ml/m LA Biplane Vol: 37.1 ml 19.88 ml/m  AORTIC VALVE                    PULMONIC VALVE AV Area (Vmax):    1.72 cm     PV Vmax:        0.54 m/s AV Area (Vmean):   1.84 cm     PV Peak grad:   1.2 mmHg AV Area (VTI):     2.28 cm     RVOT Peak grad: 3 mmHg AV Vmax:           145.00 cm/s AV Vmean:          104.500 cm/s AV VTI:            0.284 m AV Peak Grad:      8.4 mmHg AV Mean Grad:      5.0 mmHg LVOT Vmax:         79.60 cm/s LVOT Vmean:        61.300 cm/s LVOT VTI:          0.206 m LVOT/AV VTI ratio: 0.73  AORTA Ao Root diam: 2.60 cm MITRAL VALVE                TRICUSPID VALVE MV Area (PHT): 7.29 cm      TR Peak grad:   16.6 mmHg MV Decel Time: 104 msec     TR Vmax:        204.00 cm/s MV E velocity: 74.60 cm/s MV A velocity: 126.00 cm/s  SHUNTS MV E/A ratio:  0.59         Systemic VTI:  0.21 m                             Systemic Diam: 2.00 cm Julien Nordmann MD Electronically signed by Julien Nordmann MD Signature Date/Time: 08/01/2020/3:05:27 PM    Final    MR CERVICAL SPINE WO CONTRAST  Final Result    MR BRAIN WO CONTRAST  Final Result    CT ANGIO HEAD W OR WO CONTRAST  Final Result    CT ANGIO NECK W OR WO CONTRAST  Final Result    CT HEAD WO CONTRAST  Final Result      Scheduled Meds: .  stroke: mapping our early stages of recovery book   Does not apply Once  . amLODipine  10 mg Oral Daily  . aspirin EC  81 mg Oral Daily  . atorvastatin  80 mg Oral Daily  . carvedilol  12.5 mg Oral BID WC  . clopidogrel  75 mg Oral Daily  . enoxaparin (LOVENOX) injection  40 mg Subcutaneous Q24H  . [START ON 08/02/2020] hydrochlorothiazide  25 mg Oral Daily  . insulin aspart  0-20 Units Subcutaneous TID AC & HS  . insulin glargine  30 Units Subcutaneous Daily  . lisinopril  40 mg Oral Daily  . sodium chloride flush  3 mL Intravenous Once   PRN Meds: acetaminophen **OR** acetaminophen (TYLENOL) oral liquid 160 mg/5 mL **OR** acetaminophen, hydrALAZINE, labetalol Continuous Infusions: . sodium chloride 50 mL/hr at 08/01/20 0523      LOS: 1 day  Time spent: Greater than 50% of the 35 minute visit was spent in counseling/coordination of care for the patient as laid out in the A&P.   Lewie Chamber, MD [facility] 08/01/2020, 5:27 PM  Contact via secure chat.  To contact the attending provider between 7A-7P or the covering provider during after hours 7P-7A, please log into the web site [email or web address] and access using universal [place] password for that web site. If you do not have the password, please call the hospital operator.

## 2020-08-01 NOTE — ED Notes (Signed)
Breakfast tray given to pt 

## 2020-08-01 NOTE — ED Notes (Signed)
Pt requesting Lantus. Med rec has been done, do not see that ordered. Messaged MD letting her know.

## 2020-08-01 NOTE — ED Notes (Signed)
Patient blood sugar was 418 notified Garner Gavel. RN

## 2020-08-01 NOTE — Progress Notes (Signed)
*  PRELIMINARY RESULTS* Echocardiogram 2D Echocardiogram has been performed.  Cristela Blue 08/01/2020, 2:32 PM

## 2020-08-02 ENCOUNTER — Other Ambulatory Visit: Payer: Self-pay | Admitting: Internal Medicine

## 2020-08-02 DIAGNOSIS — M5124 Other intervertebral disc displacement, thoracic region: Secondary | ICD-10-CM

## 2020-08-02 LAB — CBC WITH DIFFERENTIAL/PLATELET
Abs Immature Granulocytes: 0.03 10*3/uL (ref 0.00–0.07)
Basophils Absolute: 0.1 10*3/uL (ref 0.0–0.1)
Basophils Relative: 1 %
Eosinophils Absolute: 0.4 10*3/uL (ref 0.0–0.5)
Eosinophils Relative: 4 %
HCT: 48.5 % — ABNORMAL HIGH (ref 36.0–46.0)
Hemoglobin: 16 g/dL — ABNORMAL HIGH (ref 12.0–15.0)
Immature Granulocytes: 0 %
Lymphocytes Relative: 20 %
Lymphs Abs: 1.9 10*3/uL (ref 0.7–4.0)
MCH: 27.7 pg (ref 26.0–34.0)
MCHC: 33 g/dL (ref 30.0–36.0)
MCV: 83.9 fL (ref 80.0–100.0)
Monocytes Absolute: 0.9 10*3/uL (ref 0.1–1.0)
Monocytes Relative: 10 %
Neutro Abs: 6 10*3/uL (ref 1.7–7.7)
Neutrophils Relative %: 65 %
Platelets: 328 10*3/uL (ref 150–400)
RBC: 5.78 MIL/uL — ABNORMAL HIGH (ref 3.87–5.11)
RDW: 15 % (ref 11.5–15.5)
WBC: 9.2 10*3/uL (ref 4.0–10.5)
nRBC: 0 % (ref 0.0–0.2)

## 2020-08-02 LAB — MAGNESIUM: Magnesium: 2.4 mg/dL (ref 1.7–2.4)

## 2020-08-02 LAB — GLUCOSE, CAPILLARY
Glucose-Capillary: 130 mg/dL — ABNORMAL HIGH (ref 70–99)
Glucose-Capillary: 340 mg/dL — ABNORMAL HIGH (ref 70–99)
Glucose-Capillary: 277 mg/dL — ABNORMAL HIGH (ref 70–99)

## 2020-08-02 LAB — BASIC METABOLIC PANEL
Anion gap: 11 (ref 5–15)
BUN: 13 mg/dL (ref 6–20)
CO2: 25 mmol/L (ref 22–32)
Calcium: 9.1 mg/dL (ref 8.9–10.3)
Chloride: 104 mmol/L (ref 98–111)
Creatinine, Ser: 0.54 mg/dL (ref 0.44–1.00)
GFR calc Af Amer: >60 mL/min (ref >60)
GFR calc non Af Amer: >60 mL/min (ref >60)
Glucose, Bld: 46 mg/dL — ABNORMAL LOW (ref 70–99)
Potassium: 3.4 mmol/L — ABNORMAL LOW (ref 3.5–5.1)
Sodium: 140 mmol/L (ref 135–145)

## 2020-08-02 MED ORDER — CLOPIDOGREL BISULFATE 75 MG PO TABS: 75.0000 mg | ORAL_TABLET | Freq: Every day | ORAL | 0 refills | Status: AC

## 2020-08-02 MED ORDER — INSULIN GLARGINE 100 UNIT/ML ~~LOC~~ SOLN
30.0000 [IU] | Freq: Every day | SUBCUTANEOUS | Status: DC
  Filled 2020-08-02 (×2): qty 0.3

## 2020-08-02 MED ORDER — ATORVASTATIN CALCIUM 80 MG PO TABS
80.0000 mg | ORAL_TABLET | Freq: Every day | ORAL | 3 refills | Status: DC
Start: 2020-08-03 — End: 2020-08-03

## 2020-08-02 MED ORDER — ASPIRIN 81 MG PO TBEC
81.0000 mg | DELAYED_RELEASE_TABLET | Freq: Every day | ORAL | 11 refills | Status: DC
Start: 2020-08-03 — End: 2021-05-19

## 2020-08-02 MED ORDER — MELOXICAM 7.5 MG PO TABS: 7.5000 mg | ORAL_TABLET | Freq: Every day | ORAL | 0 refills | Status: DC | PRN

## 2020-08-02 NOTE — Evaluation (Signed)
Physical Therapy Evaluation Patient Details Name: Courtney Stafford MRN: [id number] DOB: [birth year] Today's Date: 08/02/2020   History of Present Illness  Courtney Stafford is a 49 yo caucasian female with PMH HTN, DMII, obesity who presented to the ER with right-sided weakness to her upper and lower extremity. 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    Clinical Impression  Patient is alert and oriented x 4 and able to provide detailed history. She lives alone in a single floor home with 3+1 steps to enter and left handrail only. Prior to hospitalization she was independent with ambulation for home and short community distances without using an assistive device but mostly just went out of the house to outpatient PT where she was being treated for L knee pain/dysfunction. Does not drive and uses transportation service or friends bring her groceries. Has no assistive equipment at home. Does have difficulty with L knee bucking at times. Upon PT eval, patient is mod I with bed mobility including in elevated bed like the one she has at home and completes transfers and ambulation with RW and supervision. She does have some weakness in her LE that affects mobility, most notably in R hip flexion that affects her gait pattern where she is unable to fully clear the floor during each swing phase. She required CGA to complete stairs with left handrail only and was unsteady with demonstrated weakness that she should have assistance with this at home for safety. Patient has experienced a decrease in functional strength and would benefit from continued PT once she discharges from the hospital. Preferably she would benefit from OP PT where she is already an established patient, but she will need HHPT if she is unable to get assistance with using the stairs to enter and exit home. Patient would benefit from skilled physical therapy to address impairments and functional limitations  (see PT Problem List below) to work towards stated goals and return to PLOF or maximal functional independence.       Follow Up Recommendations Home health PT;Outpatient PT (Patient needs assistance with stairs to navigate them safely, already is seeing PT in OP setting and would benefit from continued care there if able to get assistance with stairs (min A); otherwise needs HHPT)    Equipment Recommendations  Rolling walker with 5" wheels;3in1 (PT)    Recommendations for Other Services       Precautions / Restrictions Precautions Precautions: Fall Restrictions Weight Bearing Restrictions: No      Mobility  Bed Mobility Overal bed mobility: Modified Independent             General bed mobility comments: sit <> supine with Increased time from flat bed at lower level and at high level to simulate home bed.  Transfers Overall transfer level: Needs assistance Equipment used: Rolling walker (2 wheeled) Transfers: Sit to/from Stand Sit to Stand: Supervision         General transfer comment: sit <> stand from bed at lowest position and also elevated position to simulate the height of her bed at home.  Ambulation/Gait Ambulation/Gait assistance: Supervision Gait Distance (Feet): 110 Feet (110 feet x 2) Assistive device: Rolling walker (2 wheeled) Gait Pattern/deviations: Step-through pattern Gait velocity: slow   General Gait Details: ambulated from room to rehab gym, restend, and returned with supervision using RW. decreased foot clearance on R swing through due to hip flexion weakness  Stairs Stairs: Yes Stairs assistance: Min guard Stair Management: One  rail Left;Step to pattern;Sideways;Backwards;Forwards Number of Stairs: 4 (2x4) General stair comments: ascended and descended 4 steps using L handrail only, practicing several techinques. Forward ascent with left LE lead and L UE support, unstable with LE weakness fiurst set; moved to ascent sideways with R LE lead  with BUE, more stable but R LE weak. Descended sideways with L LE lead and B UE, weak and unsure of self each step. 2nd attempt ascent forwards reaching across body for B UE support attempting to lead with L LE, very awkward; descent backwards leading with R LE and B UE, awkward and patient very fearful of stepping backwards onto R LE. Reccomend sideways leading R LE up and L LE down with BUE  Wheelchair Mobility    Modified Rankin (Stroke Patients Only)       Balance Overall balance assessment: Needs assistance Sitting-balance support: No upper extremity supported;Feet supported Sitting balance-Leahy Scale: Good Sitting balance - Comments: able to doff both socks with foot crossed over knee and feet dangling on high bed   Standing balance support: No upper extremity supported;During functional activity Standing balance-Leahy Scale: Fair Standing balance comment: Able to stand with no UE support but reaches for objects for support when attempting to ambulate.                             Pertinent Vitals/Pain Pain Assessment: No/denies pain    Home Living Family/patient expects to be discharged to:: Private residence Living Arrangements: Alone Available Help at Discharge: Friend(s);Available PRN/intermittently (friend brings groceries every ~2weeks  (due to financial restrictions)) Type of Home: Apartment Home Access: Stairs to enter Entrance Stairs-Rails: Left Entrance Stairs-Number of Steps: 3 for porch, 1 threshold  Home Layout: One level Home Equipment: None      Prior Function Level of Independence: Independent         Comments: Does not drive, prior to pandemic volunteered at [employer]. Currently in OPPT for L knee pain      Hand Dominance   Dominant Hand: Right    Extremity/Trunk Assessment   Upper Extremity Assessment Upper Extremity Assessment: Defer to OT evaluation RUE Deficits / Details: From OT eval: Grip and deltoid 3+/5. Bicep/tricep 4-/5.  AROM WFL. 5 finger opposition intact c increased time. Pt reports intermittent numbness to RUE LUE Deficits / Details: From OT eval: 4+/5 grossly. AROM WFL.    Lower Extremity Assessment Lower Extremity Assessment: Generalized weakness RLE Deficits / Details: R DF = 3/5; R hip flexion 2/5, remaining LE grossly 4-/5 LLE Deficits / Details: 4/5 grossly. Pt reports hx of L knee pain, endorses feeling like L knee will buckle during prolonged mobility. Difficulty pushing up with L LE on stairs (needs B UE support for best stability)    Cervical / Trunk Assessment Cervical / Trunk Assessment: Normal  Communication   Communication: No difficulties  Cognition Arousal/Alertness: Awake/alert Behavior During Therapy: WFL for tasks assessed/performed Overall Cognitive Status: Within Functional Limits for tasks assessed                                        General Comments      Exercises Other Exercises Other Exercises: Educated pt on role of PT in acute care, discharge reccomendations, use of RW including how to fold and open (patient demonstrated - weak pinch but able to do), reviewed HEP from  OP PT and how to modify if needed for safety, reviewed importance of mobiltiy for functional strengthening at home and how to complete safey, reviewed need for second person to assist with stairs. Other Exercises: LBD, UBD, grooming, sup<>sitx2, sit<>stand x3, sitting/standing balance/toelrance, toileting, ~30 ft in room mobility   Assessment/Plan    PT Assessment Patient needs continued PT services  PT Problem List Decreased strength;Decreased coordination;Impaired sensation;Decreased activity tolerance;Decreased knowledge of use of DME;Decreased balance;Impaired tone;Decreased safety awareness;Decreased mobility;Decreased knowledge of precautions       PT Treatment Interventions DME instruction;Balance training;Gait training;Neuromuscular re-education;Stair training;Functional  mobility training;Patient/family education;Therapeutic activities;Therapeutic exercise    PT Goals (Current goals can be found in the Care Plan section)  Acute Rehab PT Goals Patient Stated Goal: return home and go back to OP PT PT Goal Formulation: With patient Time For Goal Achievement: 08/16/20 Potential to Achieve Goals: Good    Frequency 7X/week   Barriers to discharge Decreased caregiver support patient needs 2nd person for safety when going up/down stairs to get in/out of house    Co-evaluation               AM-PAC PT "6 Clicks" Mobility  Outcome Measure Help needed turning from your back to your side while in a flat bed without using bedrails?: None Help needed moving from lying on your back to sitting on the side of a flat bed without using bedrails?: None Help needed moving to and from a bed to a chair (including a wheelchair)?: None Help needed standing up from a chair using your arms (e.g., wheelchair or bedside chair)?: None Help needed to walk in hospital room?: A Little Help needed climbing 3-5 steps with a railing? : A Little 6 Click Score: 22    End of Session Equipment Utilized During Treatment: Gait belt Activity Tolerance: Patient tolerated treatment well;No increased pain Patient left: in bed;with call bell/phone within reach;with bed alarm set Nurse Communication: Mobility status PT Visit Diagnosis: Unsteadiness on feet (R26.81);Hemiplegia and hemiparesis Hemiplegia - Right/Left: Right Hemiplegia - dominant/non-dominant: Non-dominant Hemiplegia - caused by: Cerebral infarction    Time: 1100-1145 PT Time Calculation (min) (ACUTE ONLY): 45 min   Charges:   PT Evaluation $PT Eval Low Complexity: 1 Low PT Treatments $Gait Training: 8-22 mins $Therapeutic Activity: 8-22 mins       Luretha Murphy. Ilsa Iha, PT, DPT 08/02/20, 12:18 PM

## 2020-08-02 NOTE — Progress Notes (Signed)
Taxi called for patient.

## 2020-08-02 NOTE — Progress Notes (Signed)
Taxi here to p/u patient.

## 2020-08-02 NOTE — Progress Notes (Addendum)
Patient is being discharged home this afternoon. DC & Rx instructions given and patient acknowledged understanding. IV removed and belongings packed.

## 2020-08-02 NOTE — Evaluation (Signed)
Occupational Therapy Evaluation Patient Details Name: Courtney Stafford MRN: [id number] DOB: August 16, 1971 Today's Date: 08/02/2020    History of Present Illness Courtney Stafford is a 49 yo caucasian female with PMH HTN, DMII, obesity who presented to the ER with right-sided weakness to her upper and lower extremity. 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   Clinical Impression   Courtney Stafford was seen for OT evaluation this date. Prior to hospital admission, pt was Independent in ADLs and mobility, had a friend deliver groceries ~every 2 weeks, and was in OPPT for L knee pain. Pt lives alone in apt c [address] and friends available PRN. Pt presents to acute OT demonstrating impaired ADL performance and functional mobility 2/2 impaired functional use of dominant RUE, decreased LB access, and functional strength/balance deficits. Pt currently requires CGA grooming standing sinkside ~10 mins including hair brushing, tooth brushing, deodorant, and face washing c intermittent single UE support. SBA toileting including perihygiene and clothing mgmt. SUPERVISION doff L sock seated EOB, MIN A doff R sock (posterior lean noted). MOD I bed mobility. CGA + 1HHA for ADL t/fs - pt noted to furniture walk and reports L and R knee "feeling like they will buckle" after prolonged mobility. Pt would benefit from skilled OT to address noted impairments and functional limitations (see below for any additional details) in order to maximize safety and independence while minimizing falls risk and caregiver burden. Upon hospital discharge, recommend HHOT to maximize pt safety and return to functional independence during meaningful occupations of daily life..     Follow Up Recommendations  Home health OT;Supervision - Intermittent    Equipment Recommendations  None recommended by OT    Recommendations for Other Services       Precautions / Restrictions Precautions Precautions:  None Restrictions Weight Bearing Restrictions: No      Mobility Bed Mobility Overal bed mobility: Modified Independent      General bed mobility comments: Increased time from nearly flat bed  Transfers Overall transfer level: Needs assistance Equipment used: 1 person hand held assist Transfers: Sit to/from Stand Sit to Stand: Min guard       Balance Overall balance assessment: Needs assistance Sitting-balance support: No upper extremity supported;Feet supported Sitting balance-Leahy Scale: Good Sitting balance - Comments: Mild posterior lean c functional activity   Standing balance support: No upper extremity supported;During functional activity Standing balance-Leahy Scale: Fair Standing balance comment: Fair balance reaching inside BOS, intermittent furniture walking noted          ADL either performed or assessed with clinical judgement   ADL Overall ADL's : Needs assistance/impaired  General ADL Comments: CGA grooming standing sinkside ~10 mins including hair brushing, tooth brushing, deodorant, and face washing c intermittent single UE support. SBA toileting including perihygiene and clothing mgmt. SUPERVISION doff L sock seated EOB, MIN A doff R sock                   Pertinent Vitals/Pain Pain Assessment: No/denies pain     Hand Dominance Right   Extremity/Trunk Assessment Upper Extremity Assessment Upper Extremity Assessment: RUE deficits/detail;LUE deficits/detail RUE Deficits / Details: Grip and deltoid 3+/5. Bicep/tricep 4-/5. AROM WFL. 5 finger opposition intact c increased time. Pt reports intermittent numbness to RUE LUE Deficits / Details: 4+/5 grossly. AROM WFL.    Lower Extremity Assessment Lower Extremity Assessment: RLE deficits/detail;LLE deficits/detail RLE Deficits / Details: 3-/5 grosly LLE Deficits / Details: 4+/5 grossly. Pt reports hx  of L knee pain, endorses feeling like L knee will buckle during prolonged mobility        Communication Communication Communication: No difficulties   Cognition Arousal/Alertness: Awake/alert Behavior During Therapy: WFL for tasks assessed/performed Overall Cognitive Status: Within Functional Limits for tasks assessed               Exercises Exercises: Other exercises Other Exercises Other Exercises: Pt educated re: OT role, DME recs, d/c recs, falls prevention, neuro re-ed, stroke education, importance of mobility for functional strengthening, RUE HEP Other Exercises: LBD, UBD, grooming, sup<>sitx2, sit<>stand x3, sitting/standing balance/toelrance, toileting, ~30 ft in room mobility   Shoulder Instructions      Home Living Family/patient expects to be discharged to:: Private residence Living Arrangements: Alone Available Help at Discharge: Friend(s);Available PRN/intermittently (friend brings groceries every ~2weeks ) Type of Home: Apartment Home Access: Stairs to enter [employer] of Steps: 3 for porch, 1 threshold  Entrance Stairs-Rails: Left Home Layout: One level     Bathroom Shower/Tub: Chief Strategy Officer: Standard     Home Equipment: None          Prior Functioning/Environment Level of Independence: Independent        Comments: Does not drive, prior to pandemic volunteered at [employer]. Currently in OPPT for L knee pain         OT Problem List: Decreased strength;Decreased activity tolerance;Impaired balance (sitting and/or standing);Decreased coordination;Decreased knowledge of use of DME or AE      OT Treatment/Interventions: Self-care/ADL training;Therapeutic exercise;Energy conservation;Neuromuscular education;DME and/or AE instruction;Therapeutic activities;Patient/family education;Balance training    OT Goals(Current goals can be found in the care plan section) Acute Rehab OT Goals Patient Stated Goal: To return home OT Goal Formulation: With patient Time For Goal Achievement: 08/16/20 Potential to Achieve  Goals: Good ADL Goals Pt Will Perform Grooming: Independently;standing (c LRAD PRN) Pt Will Perform Lower Body Dressing: sit to/from stand;with modified independence (c LRAD PRN) Pt Will Transfer to Toilet: with modified independence;ambulating;regular height toilet (c LRAD PRN) Pt/caregiver will Perform Home Exercise Program: Increased ROM;Increased strength;Right Upper extremity;Independently  OT Frequency: Min 2X/week   Barriers to D/C: Decreased caregiver support             AM-PAC OT "6 Clicks" Daily Activity     Outcome Measure Help from another person eating meals?: A Little Help from another person taking care of personal grooming?: A Little Help from another person toileting, which includes using toliet, bedpan, or urinal?: A Little Help from another person bathing (including washing, rinsing, drying)?: A Little Help from another person to put on and taking off regular upper body clothing?: A Little Help from another person to put on and taking off regular lower body clothing?: A Little 6 Click Score: 18   End of Session    Activity Tolerance: Patient tolerated treatment well Patient left: in bed;with call bell/phone within reach  OT Visit Diagnosis: Hemiplegia and hemiparesis;Muscle weakness (generalized) (M62.81) Hemiplegia - Right/Left: Right Hemiplegia - dominant/non-dominant: Dominant Hemiplegia - caused by: Cerebral infarction                Time: 8003-4917 OT Time Calculation (min): 38 min Charges:  OT General Charges $OT Visit: 1 Visit OT Evaluation $OT Eval Moderate Complexity: 1 Mod OT Treatments $Self Care/Home Management : 23-37 mins  Kathie Dike, M.S. OTR/L  08/02/20, 10:34 AM  ascom [phone]

## 2020-08-02 NOTE — Assessment & Plan Note (Signed)
-   per MRI C-spine: "Bilateral posterolateral disc protrusions at T1-2 with bilateral foraminal encroachment that could affect either T1 nerve." - outpatient referral to spine surgery recommended

## 2020-08-02 NOTE — Discharge Summary (Signed)
Physician Discharge Summary  Courtney Stafford ZOX:[id number] DOB: Aug 25, 1971 DOA: 07/31/2020  PCP: Rolm Gala, NP  Admit date: 07/31/2020 Discharge date: 08/02/2020  Admitted From: home Disposition:  Home  Admitting physician: Lewie Chamber, MD Discharging physician: Lewie Chamber, MD  Recommendations for Outpatient Follow-up:  1. Consider adjustment of Lantus if any further hypoglycemia 2. Neurology recommended outpatient referral regarding disc protrusions at T1-2  Home Health: PT/OT Equipment/Devices: Rolling walker with 5" wheels;3in1   Patient discharged to home in Discharge Condition: stable CODE STATUS: Full Diet recommendation:  Diet Orders (From admission, onward)    Start     Ordered   08/02/20 0000  Diet Carb Modified        08/02/20 1243   07/31/20 1953  Diet heart healthy/carb modified Room service appropriate? Yes; Fluid consistency: Thin  Diet effective now       Question Answer Comment  Diet-HS Snack? Nothing   Room service appropriate? Yes   Fluid consistency: Thin      07/31/20 1952          Hospital Course: Courtney Stafford is a 49 yo CF with PMH HTN, DMII, obesity who presented to the ER with right-sided weakness to her upper and lower extremity.  She states that she went to bed in her normal state on 07/29/2020 and upon wakening up around 10 AM on 07/30/2020 she noticed right-sided weakness.  She ignored this for most of the day thinking it would improve and also spoke to her PCP.  She was ultimately recommended to present to the ER for further evaluation due to lingering symptoms of right-sided weakness.  She denied any other neurologic deficits including paresthesias, facial droop, vision changes.  She does continue to smoke, states it takes approximately 1 week for her to go through half a pack of cigarettes.  Occasional alcohol use and denies illicit drug use.  She does check her blood pressure at home and states it is controlled on her current home  regimen.  In the ER, she was found to be hypertensive, 157/113 with ranges up to systolic 232/136.  She was given a dose of labetalol. CT head was obtained in the ER which revealed cerebral atrophy and chronic microvascular ischemic changes that had progressed since 2010.  There was also a 1 cm hypodensity in the left posterior frontal lobe consistent with a possible infarct.  She is admitted for further stroke work-up.  MRI brain was obtained which confirmed acute CVA involving left caudate nucleus. CTA head/neck were negative for significant stenosis.  Due to some incontinence concerns voiced to neurology, she underwent MRI of cervical spine to rule out significant stenosis. There was no obvious cord compression however she was noted to have some moderate bilateral foraminal narrowing at C5-6 and C6-7. There was also some disc protrusions at T1-2 with bilateral for minimal encroachment. She was recommended to have follow-up outpatient with spine surgery at discharge as well.   She was evaluated by neurology and started on asa/plavix for 3 weeks then aspirin thereafter.   Her glucose levels were initially elevated but after resumed on her home insulin regimen, her glucose decreased down into the 40s-50s. She states that her levels at home are typically controlled on her regimen and she demonstrated/stated proper injection technique. Her A1c returned at 7.2%. She was instructed to discuss her insulin regimen at follow up regarding any further adjustments.    CVA (cerebral vascular accident) [facility]) - MRI confirms left caudate nucleus infarct - MRI  c spine negative for cord compression - s/p permissive HTN period, will start pursuing better BP control - follow up PT eval - follow up echo: EF 60-65%, Gr 1 DD. No PFO -TSH normal, LDL 109 continue statin, A1c 7.2 better than previous ones -Continue neuro checks and monitoring for another 24 hours  HTN (hypertension) - s/p permissive htn - home  meds resumed.  Pressure under better control -Continued on home regimen at discharge  Type 1 diabetes (HCC) - SSI and CBG monitoring - continue lantus and SSI - A1c 7.2% -Patient recommended to follow-up with PCP regarding possible adjustment of home regimen given hypoglycemia during hospitalization  Protrusion of thoracic intervertebral disc - per MRI C-spine: "Bilateral posterolateral disc protrusions at T1-2 with bilateral foraminal encroachment that could affect either T1 nerve." - outpatient referral to spine surgery recommended    The patient's chronic medical conditions were treated accordingly per the patient's home medication regimen except as noted.  On day of discharge, patient was felt deemed stable for discharge. Patient/family member advised to call PCP or come back to ER if needed.   Discharge Diagnoses:   Principal Diagnosis: CVA (cerebral vascular accident) [facility])  Active Hospital Problems   Diagnosis Date Noted  . CVA (cerebral vascular accident) (HCC) 07/31/2020    Priority: High  . Protrusion of thoracic intervertebral disc 08/02/2020  . HTN (hypertension) 10/22/2015  . Type 1 diabetes (HCC) 10/22/2015    Resolved Hospital Problems  No resolved problems to display.    Discharge Instructions    Ambulatory referral to Physical Therapy   Complete by: As directed    Diet Carb Modified   Complete by: As directed    Discharge instructions   Complete by: As directed    Discuss with your primary care about reducing dose of insulin   Increase activity slowly   Complete by: As directed      Allergies as of 08/02/2020      Reactions   Phenergan [promethazine] Hives      Medication List    TAKE these medications   amLODipine 10 MG tablet Commonly known as: NORVASC Take 1 tablet (10 mg total) by mouth daily.   aspirin 81 MG EC tablet Take 1 tablet (81 mg total) by mouth daily. Swallow whole. Start taking on: August 03, 2020   atorvastatin 80 MG  tablet Commonly known as: LIPITOR Take 1 tablet (80 mg total) by mouth daily. Start taking on: August 03, 2020 What changed:   medication strength  how much to take   Blood Glucose Monitoring Suppl Supplies Misc 1 each by Does not apply route 5 (five) times daily.   carvedilol 12.5 MG tablet Commonly known as: COREG Take 1 tablet (12.5 mg total) by mouth 2 (two) times daily with a meal.   clopidogrel 75 MG tablet Commonly known as: PLAVIX Take 1 tablet (75 mg total) by mouth daily for 21 days. Start taking on: August 03, 2020   DULoxetine 60 MG capsule Commonly known as: CYMBALTA Take 1 capsule (60 mg total) by mouth every morning.   gabapentin 300 MG capsule Commonly known as: NEURONTIN TAKE 2 CAPSULES (600MG ) BY MOUTH 2 TIMES A DAY AND 3 CAPSULES (900MG )AT NIGHT   hydrochlorothiazide 25 MG tablet Commonly known as: HYDRODIURIL Take 1 tablet (25 mg total) by mouth daily.   insulin glargine 100 UNIT/ML Solostar Pen Commonly known as: LANTUS Inject 30 Units into the skin at bedtime.   Insulin Pen Needle 32G X 4 MM  Misc Commonly known as: BD Pen Needle Nano U/F 5 times daily   lisinopril 40 MG tablet Commonly known as: ZESTRIL Take 1 tablet (40 mg total) by mouth daily.   meloxicam 7.5 MG tablet Commonly known as: Mobic Take 1 tablet (7.5 mg total) by mouth daily as needed for pain. What changed:   when to take this  reasons to take this   mirtazapine 30 MG tablet Commonly known as: REMERON Take 1 tablet (30 mg total) by mouth at bedtime.   NovoLOG FlexPen 100 UNIT/ML FlexPen Generic drug: insulin aspart She states taking 5-7 units with meals and adjust with sliding scale as prescribed by Endocrinology.[facility]  (From admission, onward)         Start     Ordered   08/02/20 1239  DME 3-in-1  Once        08/02/20 1243          Allergies  Allergen Reactions  . Phenergan [Promethazine] Hives     Consultations: Neurology   Discharge Exam: BP (!) 143/84 (BP Location: Right Arm)   Pulse 86   Temp (!) 97.5 F (36.4 C) (Oral)   Resp 18   Ht 5' (1.524 m)   Wt 90.7 kg   LMP 01/03/2016 (Within Years)   SpO2 100%   BMI 39.06 kg/m  General appearance:alert, cooperative and no distress Head:Normocephalic, without obvious abnormality, atraumatic Eyes:EOMIl, PERRLA Lungs:clear to auscultation bilaterally Heart:regular rate and rhythm and S1, S2 normal Abdomen:normal findings:bowel sounds normal, soft, non-tender andobese Extremities:no edema Skin:mobility and turgor normal Neurologic:4/5 RUE/RLE strength; subtle dysmetria with RUE; no paresthesias; gait deferred  The results of significant diagnostics from this hospitalization (including imaging, microbiology, ancillary and laboratory) are listed below for reference.   Microbiology: Recent Results (from the past 240 hour(s))  SARS Coronavirus 2 by RT PCR (hospital order, performed in [facility] hospital lab) Nasopharyngeal Nasopharyngeal Swab     Status: None   Collection Time: 07/31/20  3:44 PM   Specimen: Nasopharyngeal Swab  Result Value Ref Range Status   SARS Coronavirus 2 NEGATIVE NEGATIVE Final    Comment: (NOTE) SARS-CoV-2 target nucleic acids are NOT DETECTED.  The SARS-CoV-2 RNA is generally detectable in upper and lower respiratory specimens during the acute phase of infection. The lowest concentration of SARS-CoV-2 viral copies this assay can detect is 250 copies / mL. A negative result does not preclude SARS-CoV-2 infection and should not be used as the sole basis for treatment or other patient management decisions.  A negative result may occur with improper specimen collection / handling, submission of specimen other than nasopharyngeal swab, presence of viral mutation(s) within the areas targeted by this assay, and inadequate number of viral copies (<250 copies / mL). A negative result must be  combined with clinical observations, patient history, and epidemiological information.  Fact Sheet for Patients:   BoilerBrush.com.cy  Fact Sheet for Healthcare Providers: https://pope.com/  This test is not yet approved or  cleared by the [place] FDA and has been authorized for detection and/or diagnosis of SARS-CoV-2 by FDA under an Emergency Use Authorization (EUA).  This EUA will remain in effect (meaning this test can be used) for the duration of the COVID-19 declaration under Section 564(b)(1) of the Act, 21 U.S.C. section 360bbb-3(b)(1), unless the authorization is terminated or revoked sooner.  Performed at [facility], [address] [place], [place] [address]  Labs: BNP (last 3 results) No results for input(s): BNP in the last 8760 hours. Basic Metabolic Panel: Recent Labs  Lab 07/31/20 1317 08/01/20 0325 08/02/20 0449  NA 136 140 140  K 3.4* 3.5 3.4*  CL 99 99 104  CO2 25 30 25   GLUCOSE 281* 78 46*  BUN 12 13 13   CREATININE 0.77 0.67 0.54  CALCIUM 9.3 9.1 9.1  MG  --  2.4 2.4   Liver Function Tests: Recent Labs  Lab 07/31/20 1317  AST 18  ALT 18  ALKPHOS 99  BILITOT 0.6  PROT 7.9  ALBUMIN 4.0   No results for input(s): LIPASE, AMYLASE in the last 168 hours. No results for input(s): AMMONIA in the last 168 hours. CBC: Recent Labs  Lab 07/31/20 1317 08/01/20 0325 08/02/20 0449  WBC 9.1 8.0 9.2  NEUTROABS 6.9 4.8 6.0  HGB 15.8* 14.8 16.0*  HCT 49.1* 46.3* 48.5*  MCV 85.7 87.4 83.9  PLT 336 317 328   Cardiac Enzymes: No results for input(s): CKTOTAL, CKMB, CKMBINDEX, TROPONINI in the last 168 hours. BNP: Invalid input(s): POCBNP CBG: Recent Labs  Lab 08/01/20 1439 08/01/20 1836 08/01/20 2042 08/02/20 0810 08/02/20 1138  GLUCAP 418* 169* 287* 130* 340*   D-Dimer No results for input(s): DDIMER in the last 72 hours. Hgb A1c Recent Labs    07/31/20 1317   HGBA1C 7.2*   Lipid Profile Recent Labs    07/31/20 1317  CHOL 218*  HDL 78  LDLCALC 109*  TRIG 157*  CHOLHDL 2.8   Thyroid function studies Recent Labs    07/31/20 1317  TSH 0.951   Anemia work up No results for input(s): VITAMINB12, FOLATE, FERRITIN, TIBC, IRON, RETICCTPCT in the last 72 hours. Urinalysis    Component Value Date/Time   COLORURINE Red 02/12/2014 1444   APPEARANCEUR Clear 07/16/2020 1113   LABSPEC 1.024 02/12/2014 1444   PHURINE 6.0 02/12/2014 1444   GLUCOSEU Trace (A) 07/16/2020 1113   GLUCOSEU >=500 02/12/2014 1444   HGBUR 3+ 02/12/2014 1444   BILIRUBINUR Negative 07/16/2020 1113   BILIRUBINUR Negative 02/12/2014 1444   KETONESUR Trace 02/12/2014 1444   PROTEINUR Negative 07/16/2020 1113   PROTEINUR 100 mg/dL 91/47/8295 6213   NITRITE Negative 07/16/2020 1113   NITRITE Negative 02/12/2014 1444   LEUKOCYTESUR Negative 07/16/2020 1113   LEUKOCYTESUR Negative 02/12/2014 1444   Sepsis Labs Invalid input(s): PROCALCITONIN,  WBC,  LACTICIDVEN Microbiology Recent Results (from the past 240 hour(s))  SARS Coronavirus 2 by RT PCR (hospital order, performed in [facility] hospital lab) Nasopharyngeal Nasopharyngeal Swab     Status: None   Collection Time: 07/31/20  3:44 PM   Specimen: Nasopharyngeal Swab  Result Value Ref Range Status   SARS Coronavirus 2 NEGATIVE NEGATIVE Final    Comment: (NOTE) SARS-CoV-2 target nucleic acids are NOT DETECTED.  The SARS-CoV-2 RNA is generally detectable in upper and lower respiratory specimens during the acute phase of infection. The lowest concentration of SARS-CoV-2 viral copies this assay can detect is 250 copies / mL. A negative result does not preclude SARS-CoV-2 infection and should not be used as the sole basis for treatment or other patient management decisions.  A negative result may occur with improper specimen collection / handling, submission of specimen other than nasopharyngeal swab, presence of  viral mutation(s) within the areas targeted by this assay, and inadequate number of viral copies (<250 copies / mL). A negative result must be combined with clinical observations, patient history, and epidemiological information.  Fact Sheet for Patients:   BoilerBrush.com.cy  Fact Sheet for Healthcare Providers: https://pope.com/  This test is not yet approved or  cleared by the [place] FDA and has been authorized for detection and/or diagnosis of SARS-CoV-2 by FDA under an Emergency Use Authorization (EUA).  This EUA will remain in effect (meaning this test can be used) for the duration of the COVID-19 declaration under Section 564(b)(1) of the Act, 21 U.S.C. section 360bbb-3(b)(1), unless the authorization is terminated or revoked sooner.  Performed at [facility], [address] [place], [place] [address]     Procedures/Studies: CT ANGIO HEAD W OR WO CONTRAST  Result Date: 07/31/2020 CLINICAL DATA:  49 year old female with altered mental status and right side symptoms with appearance suspicious for acute small vessel infarct in the left corona radiata on plain head CT today. EXAM: CT ANGIOGRAPHY HEAD AND NECK TECHNIQUE: Multidetector CT imaging of the head and neck was performed using the standard protocol during bolus administration of intravenous contrast. Multiplanar CT image reconstructions and MIPs were obtained to evaluate the vascular anatomy. Carotid stenosis measurements (when applicable) are obtained utilizing NASCET criteria, using the distal internal carotid diameter as the denominator. CONTRAST:  103mL OMNIPAQUE IOHEXOL 350 MG/ML SOLN COMPARISON:  Plain head CT 1341 hours today. FINDINGS: CTA NECK Skeleton: No acute osseous abnormality identified. Upper chest: Negative. Other neck: Negative. Aortic arch: 3 vessel arch configuration with tortuous great vessels. No arch atherosclerosis. Right carotid system:  Negative aside from tortuous brachiocephalic artery, cervical right ICA. Left carotid system: Negative aside from tortuous left CCA and cervical left ICA. Vertebral arteries: Normal proximal right subclavian artery. The right vertebral artery has an early origin on series 6, image 268, and then the right vertebral has a late entry into the cervical transverse foramen (normal variant). The right vertebral is also mildly non dominant and normal to the skull base. Tortuous proximal left subclavian artery without stenosis. Dominant left vertebral artery has a normal origin. The left V2 segment is tortuous. The left vertebral remains widely patent to the skull base. CTA HEAD Posterior circulation: Dominant left V4 segment. Mild distal vertebral ectasias a. No distal vertebral plaque or stenosis. Normal right PICA and dominant appearing left AICA origins. Normal vertebrobasilar junction and basilar artery. Patent SCA and PCA origins. Both posterior communicating arteries are present. Bilateral PCA branches are within normal limits. Anterior circulation: Trace gas in the left cavernous sinus is likely intravenous access related. Both ICA siphons are patent with only minimal plaque. No ICA siphon stenosis. Ophthalmic and posterior communicating artery origins appear normal. Patent carotid termini. Normal MCA and ACA origins. Left A1 segment is mildly dominant. Anterior communicating artery is normal. The right A2 is dominant, somewhat simulating azygos ACA anatomy. ACA branches are within normal limits. Left MCA M1 segment and bifurcation are patent without stenosis. Right MCA M1 segment bifurcates early without stenosis. Bilateral MCA branches are within normal limits. Venous sinuses: Patent. Anatomic variants: Dominant left vertebral artery. Early origin of the right vertebral. Dominant left A1 and right A2 segments. Review of the MIP images confirms the above findings IMPRESSION: Negative for large vessel occlusion.  Minimal atherosclerosis with no stenosis. Generalized arterial tortuosity. Electronically Signed   By: Odessa Fleming M.D.   On: 07/31/2020 21:17   CT HEAD WO CONTRAST  Result Date: 07/31/2020 CLINICAL DATA:  Mental status change. Weakness and numbness right body. EXAM: CT HEAD WITHOUT CONTRAST TECHNIQUE: Contiguous axial images were obtained from the base of the skull  through the vertex without intravenous contrast. COMPARISON:  CT head 05/21/2009 FINDINGS: Brain: Mild cortical atrophy with progression. Negative for hydrocephalus. Diffuse white matter hypodensity bilaterally with progression. Focal hypodensity in the left posterior frontal deep white matter could be an acute white matter infarct. Negative for hemorrhage or mass. Vascular: Negative for hyperdense vessel Skull: Negative Sinuses/Orbits: Paranasal sinuses clear.  Negative orbit. Other: None IMPRESSION: Progression of atrophy and chronic microvascular ischemic change since 2010 Focal 1 cm hypodensity left posterior frontal white matter consistent with infarct possibly acute. Electronically Signed   By: Marlan Palau M.D.   On: 07/31/2020 13:57   CT ANGIO NECK W OR WO CONTRAST  Result Date: 07/31/2020 CLINICAL DATA:  49 year old female with altered mental status and right side symptoms with appearance suspicious for acute small vessel infarct in the left corona radiata on plain head CT today. EXAM: CT ANGIOGRAPHY HEAD AND NECK TECHNIQUE: Multidetector CT imaging of the head and neck was performed using the standard protocol during bolus administration of intravenous contrast. Multiplanar CT image reconstructions and MIPs were obtained to evaluate the vascular anatomy. Carotid stenosis measurements (when applicable) are obtained utilizing NASCET criteria, using the distal internal carotid diameter as the denominator. CONTRAST:  75mL OMNIPAQUE IOHEXOL 350 MG/ML SOLN COMPARISON:  Plain head CT 1341 hours today. FINDINGS: CTA NECK Skeleton: No acute  osseous abnormality identified. Upper chest: Negative. Other neck: Negative. Aortic arch: 3 vessel arch configuration with tortuous great vessels. No arch atherosclerosis. Right carotid system: Negative aside from tortuous brachiocephalic artery, cervical right ICA. Left carotid system: Negative aside from tortuous left CCA and cervical left ICA. Vertebral arteries: Normal proximal right subclavian artery. The right vertebral artery has an early origin on series 6, image 268, and then the right vertebral has a late entry into the cervical transverse foramen (normal variant). The right vertebral is also mildly non dominant and normal to the skull base. Tortuous proximal left subclavian artery without stenosis. Dominant left vertebral artery has a normal origin. The left V2 segment is tortuous. The left vertebral remains widely patent to the skull base. CTA HEAD Posterior circulation: Dominant left V4 segment. Mild distal vertebral ectasias a. No distal vertebral plaque or stenosis. Normal right PICA and dominant appearing left AICA origins. Normal vertebrobasilar junction and basilar artery. Patent SCA and PCA origins. Both posterior communicating arteries are present. Bilateral PCA branches are within normal limits. Anterior circulation: Trace gas in the left cavernous sinus is likely intravenous access related. Both ICA siphons are patent with only minimal plaque. No ICA siphon stenosis. Ophthalmic and posterior communicating artery origins appear normal. Patent carotid termini. Normal MCA and ACA origins. Left A1 segment is mildly dominant. Anterior communicating artery is normal. The right A2 is dominant, somewhat simulating azygos ACA anatomy. ACA branches are within normal limits. Left MCA M1 segment and bifurcation are patent without stenosis. Right MCA M1 segment bifurcates early without stenosis. Bilateral MCA branches are within normal limits. Venous sinuses: Patent. Anatomic variants: Dominant left  vertebral artery. Early origin of the right vertebral. Dominant left A1 and right A2 segments. Review of the MIP images confirms the above findings IMPRESSION: Negative for large vessel occlusion. Minimal atherosclerosis with no stenosis. Generalized arterial tortuosity. Electronically Signed   By: Odessa Fleming M.D.   On: 07/31/2020 21:17   MR BRAIN WO CONTRAST  Result Date: 07/31/2020 CLINICAL DATA:  Right-sided weakness EXAM: MRI HEAD WITHOUT CONTRAST TECHNIQUE: Multiplanar, multiecho pulse sequences of the brain and surrounding structures were obtained without  intravenous contrast. COMPARISON:  None. FINDINGS: Brain: There is a small acute infarct within the posterior aspect of the left caudate. No other diffusion abnormality. No acute hemorrhage. Multifocal hyperintense T2-weighted signal within the white matter. Normal volume of CSF spaces. Single focus of chronic microhemorrhage in the left pons. Normal midline structures. Vascular: Normal flow voids. Skull and upper cervical spine: Normal marrow signal. Sinuses/Orbits: Negative. Other: None. IMPRESSION: 1. Small acute infarct within the posterior aspect of the left caudate nucleus. No acute hemorrhage or mass effect. 2. Multifocal hyperintense T2-weighted signal within the white matter, most commonly chronic small vessel ischemia. Electronically Signed   By: Deatra RobinsonKevin  Herman M.D.   On: 07/31/2020 21:46   MR CERVICAL SPINE WO CONTRAST  Result Date: 08/01/2020 CLINICAL DATA:  Myelopathy. Spinal stenosis. Sensory level at C4. Incontinence and hyper reflexia. EXAM: MRI CERVICAL SPINE WITHOUT CONTRAST TECHNIQUE: Multiplanar, multisequence MR imaging of the cervical spine was performed. No intravenous contrast was administered. COMPARISON:  CT neck 07/31/2020 FINDINGS: The patient would allow completion of only the sagittal T2 imaging. Alignment: Normal Vertebrae: No fracture or primary bone lesion. Cord: No cord compression or primary cord lesion. Posterior Fossa,  vertebral arteries, paraspinal tissues: Old small vessel infarctions of the pons. Disc levels: Foramen magnum widely patent.  C1-2 and C2-3 are normal. C3-4: Endplate osteophytes and bulging of the disc more prominent towards the left. No compressive canal or foraminal stenosis suspected. C4-5: Normal interspace. C5-6: Spondylosis with endplate osteophytes and bulging of the disc. Narrowing of the ventral subarachnoid space but no compression of the cord. Moderate bilateral foraminal narrowing. C6-7: Spondylosis with endplate osteophytes and bulging of the disc. Narrowing of the ventral subarachnoid space but no compressive effect upon the cord. Moderate bilateral foraminal narrowing. C7-T1: Normal interspace. T1-2: Bilateral posterolateral disc protrusions. No compressive canal stenosis. Bilateral foraminal encroachment could affect either T1 nerve. T2-3 and T3-4: Unremarkable. IMPRESSION: 1. The patient would allow completion of only the sagittal T2 imaging. 2. No cord compression or primary cord lesion. 3. 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 4. Bilateral posterolateral disc protrusions at T1-2 with bilateral foraminal encroachment that could affect either T1 nerve. Electronically Signed   By: Paulina FusiMark  Shogry M.D.   On: 08/01/2020 11:27   ECHOCARDIOGRAM COMPLETE  Result Date: 08/01/2020    ECHOCARDIOGRAM REPORT   Patient Name:   Egbert GaribaldiNGELA M Kloeppel Date of Exam: 08/01/2020 Medical Rec #:  [id number]        Height:       60.0 in Accession #:    [id number]       Weight:       200.0 lb Date of Birth:  [birth year]        BSA:          1.867 m Patient Age:    49 years         BP:           159/114 mmHg Patient Gender: F                HR:           82 bpm. Exam Location:  [facility] Procedure: 2D Echo, Cardiac Doppler and Color Doppler Indications:     Stroke 434.91  History:         Patient has no prior history of Echocardiogram examinations.                  Risk  Factors:Hypertension and Diabetes.  Sonographer:     Cristela Blue RDCS (AE) Referring Phys:  [phone] Braheem Tomasik Frederick Peers Diagnosing Phys: Julien Nordmann MD IMPRESSIONS  1. Left ventricular ejection fraction, by estimation, is 60 to 65%. The left ventricle has normal function. The left ventricle has no regional wall motion abnormalities. Left ventricular diastolic parameters are consistent with Grade I diastolic dysfunction (impaired relaxation).  2. Right ventricular systolic function is normal. The right ventricular size is normal. There is normal pulmonary artery systolic pressure. FINDINGS  Left Ventricle: Left ventricular ejection fraction, by estimation, is 60 to 65%. The left ventricle has normal function. The left ventricle has no regional wall motion abnormalities. The left ventricular internal cavity size was normal in size. There is  no left ventricular hypertrophy. Left ventricular diastolic parameters are consistent with Grade I diastolic dysfunction (impaired relaxation). Right Ventricle: The right ventricular size is normal. No increase in right ventricular wall thickness. Right ventricular systolic function is normal. There is normal pulmonary artery systolic pressure. The tricuspid regurgitant velocity is 2.04 m/s, and  with an assumed right atrial pressure of 5 mmHg, the estimated right ventricular systolic pressure is 21.6 mmHg. Left Atrium: Left atrial size was normal in size. Right Atrium: Right atrial size was normal in size. Pericardium: There is no evidence of pericardial effusion. Mitral Valve: The mitral valve is normal in structure. No evidence of mitral valve regurgitation. No evidence of mitral valve stenosis. Tricuspid Valve: The tricuspid valve is not well visualized. Tricuspid valve regurgitation is mild . No evidence of tricuspid stenosis. Aortic Valve: The aortic valve was not well visualized. Aortic valve regurgitation is not visualized. No aortic stenosis is present. Aortic valve mean  gradient measures 5.0 mmHg. Aortic valve peak gradient measures 8.4 mmHg. Aortic valve area, by VTI measures 2.28 cm. Pulmonic Valve: The pulmonic valve was normal in structure. Pulmonic valve regurgitation is not visualized. No evidence of pulmonic stenosis. Aorta: The aortic root is normal in size and structure. Venous: The inferior vena cava is normal in size with greater than 50% respiratory variability, suggesting right atrial pressure of 3 mmHg. IAS/Shunts: No atrial level shunt detected by color flow Doppler.  LEFT VENTRICLE PLAX 2D LVIDd:         3.88 cm  Diastology LVIDs:         2.22 cm  LV e' medial:    7.18 cm/s LV PW:         1.07 cm  LV E/e' medial:  10.4 LV IVS:        0.97 cm  LV e' lateral:   6.85 cm/s LVOT diam:     2.00 cm  LV E/e' lateral: 10.9 LV SV:         65 LV SV Index:   35 LVOT Area:     3.14 cm  RIGHT VENTRICLE RV Basal diam:  2.57 cm RV S prime:     14.60 cm/s TAPSE (M-mode): 3.6 cm LEFT ATRIUM             Index       RIGHT ATRIUM           Index LA diam:        2.60 cm 1.39 cm/m  RA Area:     12.30 cm LA Vol (A2C):   33.5 ml 17.95 ml/m RA Volume:   25.90 ml  13.88 ml/m LA Vol (A4C):   39.3 ml 21.05 ml/m LA Biplane Vol: 37.1 ml 19.88 ml/m  AORTIC VALVE  PULMONIC VALVE AV Area (Vmax):    1.72 cm     PV Vmax:        0.54 m/s AV Area (Vmean):   1.84 cm     PV Peak grad:   1.2 mmHg AV Area (VTI):     2.28 cm     RVOT Peak grad: 3 mmHg AV Vmax:           145.00 cm/s AV Vmean:          104.500 cm/s AV VTI:            0.284 m AV Peak Grad:      8.4 mmHg AV Mean Grad:      5.0 mmHg LVOT Vmax:         79.60 cm/s LVOT Vmean:        61.300 cm/s LVOT VTI:          0.206 m LVOT/AV VTI ratio: 0.73  AORTA Ao Root diam: 2.60 cm MITRAL VALVE                TRICUSPID VALVE MV Area (PHT): 7.29 cm     TR Peak grad:   16.6 mmHg MV Decel Time: 104 msec     TR Vmax:        204.00 cm/s MV E velocity: 74.60 cm/s MV A velocity: 126.00 cm/s  SHUNTS MV E/A ratio:  0.59         Systemic  VTI:  0.21 m                             Systemic Diam: 2.00 cm Julien Nordmann MD Electronically signed by Julien Nordmann MD Signature Date/Time: 08/01/2020/3:05:27 PM    Final      Time coordinating discharge: Over 30 minutes    Lewie Chamber, MD  [facility] 08/02/2020, 2:24 PM Pager: Secure chat  If 7PM-7AM, please contact night-coverage [email or web address] Password [email or web address]

## 2020-08-05 ENCOUNTER — Ambulatory Visit: Payer: Self-pay

## 2020-08-06 ENCOUNTER — Ambulatory Visit: Payer: Self-pay | Admitting: Licensed Clinical Social Worker

## 2020-08-06 ENCOUNTER — Telehealth: Payer: Self-pay | Admitting: Licensed Clinical Social Worker

## 2020-08-06 NOTE — Telephone Encounter (Signed)
The patient called to let clinician know she tried to answer her phone for her appointment time but was having technical issues. Patient rescheduled her missed appointment,

## 2020-08-07 ENCOUNTER — Ambulatory Visit: Payer: Self-pay

## 2020-08-12 ENCOUNTER — Ambulatory Visit: Payer: Self-pay

## 2020-08-12 ENCOUNTER — Other Ambulatory Visit: Payer: Self-pay

## 2020-08-12 DIAGNOSIS — M6281 Muscle weakness (generalized): Secondary | ICD-10-CM

## 2020-08-12 DIAGNOSIS — R262 Difficulty in walking, not elsewhere classified: Secondary | ICD-10-CM

## 2020-08-12 NOTE — Therapy (Signed)
[facility]ICINE [address], [place], [address] Phone: [phone]   Fax:  [phone]  Physical Therapy Evaluation  Patient Details  Name: Courtney Stafford MRN: [id number] Date of Birth: [birth year] Referring Provider (PT): Lewie Chamber, MD   Encounter Date: 08/12/2020   PT End of Session - 08/12/20 1053    Visit Number 1    Number of Visits 17    Date for PT Re-Evaluation 10/07/20    PT Start Time 1036    PT Stop Time 1115    PT Time Calculation (min) 39 min    Equipment Utilized During Treatment Gait belt    Activity Tolerance Patient tolerated treatment well;No increased pain           Past Medical History:  Diagnosis Date  . Diabetes mellitus without complication (HCC)   . Hypertension     Past Surgical History:  Procedure Laterality Date  . CARPAL TUNNEL RELEASE  6 years ago   both hands    There were no vitals filed for this visit.    Subjective Assessment - 08/12/20 1051    Subjective Patient is a 49 y/o female with a diagnosis of a CVA which occurred on 9/15 when she woke up and could not feel her right side.  Patient is having trouble with sensation and strength in her right side along with pain in her left knee.  Patient requires the use of walker to be able to ambulate and her ability to come to the clinic is not hindered.  She also states that she is having trouble with walking, stairs, getting into her shower due to height of bathtub but was not given a shower chair. Patient states that she has a friend that goes to the store for her to get supplies and groceries.  Patient's goal is to be able to walk again without use of walker.    Pertinent History Recent CVA 07/30/20.  Left knee pain. Pt was seen by Allie Bossier in May 2021, who diagnosed knee OA questionable need for TKA in future, has recommended conservative management at this point. Pt had a cortizone injection in May (without  avail) and is shceduled for a 'gel' injection on 6/28.    Limitations Walking;Lifting;Standing;House hold activities    How long can you sit comfortably? unlimited    How long can you stand comfortably? ~5 minutes    How long can you walk comfortably? ~2-3 minutes    Diagnostic tests Diagnosed CVA    Patient Stated Goals To be able to walk without the use of an assitive device.    Currently in Pain? No/denies              [facility] PT Assessment - 08/12/20 0001      Assessment   Medical Diagnosis CVA    Referring Provider (PT) Lewie Chamber, MD    Hand Dominance Right    Next MD Visit unknown    Prior Therapy yes      Precautions   Precautions Fall      Restrictions   Weight Bearing Restrictions No      Balance Screen   Has the patient fallen in the past 6 months No    Has the patient had a decrease in activity level because of a fear of falling?  Yes    Is the patient reluctant to leave their home because of a fear of falling?  Yes  Home Environment   Living Environment Private residence      Prior Function   Level of Independence Independent with basic ADLs    Vocation --   Does Not Work      Cognition   Overall Cognitive Status Within Functional Limits for tasks assessed      Observation/Other Assessments   Focus on Therapeutic Outcomes (FOTO)  49/100      ROM / Strength   AROM / PROM / Strength Strength      Strength   Overall Strength Deficits    Strength Assessment Site Shoulder;Elbow;Forearm;Wrist;Hand;Hip;Knee;Ankle    Right/Left Shoulder Right    Right Shoulder Flexion 3-/5    Right Shoulder ABduction 3/5    Right/Left Elbow Right    Right Elbow Flexion 3+/5    Right Elbow Extension 3-/5    Right/Left Forearm --    Right/Left Wrist Right    Right Wrist Flexion 4-/5    Right Wrist Extension 3+/5    Right/Left hand Right    Right Hand Gross Grasp Functional    Right/Left Hip Right    Right Hip Flexion 3-/5    Right Hip ABduction 3-/5     Right/Left Knee Right    Right Knee Flexion 4-/5    Right Knee Extension 4-/5    Right/Left Ankle Right    Right Ankle Dorsiflexion 4-/5      Transfers   Five time sit to stand comments  21.5      Ambulation/Gait   Ambulation/Gait Yes    Ambulation/Gait Assistance 6: Modified independent (Device/Increase time)    Gait velocity 0.6850m/s    Stairs Yes    Stairs Assistance 6: Modified independent (Device/Increase time)    Stair Management Technique One rail Right    Number of Stairs 4      Standardized Balance Assessment   Standardized Balance Assessment Timed Up and Go Test;Five Times Sit to Stand;10 meter walk test    Five times sit to stand comments  21.5sec    10 Meter Walk 0.3150m/s      Timed Up and Go Test   Normal TUG (seconds) 27.7           Therapeutic Exercise - Walking with AD x3 minutes -STS x5             Objective measurements completed on examination: See above findings.               PT Education - 08/12/20 1052    Education Details Educated on Exercises and POC    Person(s) Educated Patient    Methods Explanation;Demonstration    Comprehension Verbalized understanding;Returned demonstration            PT Short Term Goals - 08/12/20 1144      PT SHORT TERM GOAL #1   Title Patient will demonstrate independence with HEP to maximize rehab potential.    Time 4    Period Weeks    Status New    Target Date 09/09/20             PT Long Term Goals - 08/12/20 1145      PT LONG TERM GOAL #1   Title Patient will demonstrate independence with progressive HEP to maintain and progress improvements achieved during therapy.    Time 8    Period Weeks    Status New    Target Date 10/07/20      PT LONG TERM GOAL #2   Title Patient will have  a FOTO score of >68/100 to show an increase in independence while performing functional daily activities.    Baseline 08/12/20: 49    Time 8    Period Weeks    Status New    Target Date  10/07/20      PT LONG TERM GOAL #3   Title Patient will demonstrate improved facility and steadiness in ambulation while performing at a gait speed of >0.83m/s.    Baseline 08/12/20: 0.37 m/s    Time 8    Period Weeks    Status New    Target Date 10/07/20      PT LONG TERM GOAL #4   Title Patient will improve 5xSTS and TUG score to show an increase in patient strength/power to decrease patients fall risk.    Baseline 08/12/20: 5xSTS: 21.5sec, TUG: 25.7sec    Time 8    Period Weeks    Status New      PT LONG TERM GOAL #5   Title Patient will be able to reach up to grab items out of her cabinets with R UE at home to show an improvement in her strength and ROM to perform task independently.    Baseline 90 Degrees of Flexion due to weakness/unable to reach up to cabinet    Time 8    Period Weeks    Status New    Target Date 10/07/20                  Plan - 08/12/20 1304    Clinical Impression Statement Patient is a 49 y/o female with a diagnosis of a CVA which occurred 07/30/20 that has affected her right sided strength which has decrease her right sided ROM of both upper and lower extremity ROM. Patient is not able to lift her right UE past 90 degrees due to UE weakness along with not being capable of flexing her hip or abducting her LE appropriate position indicating LE weakness also. Patient's standing, walking, and ability to traverse stairs have all become difficult and has limited her ability to shop independently and now she must use an AD to perform these activities. Patient will benefit from skilled therapy to address limitations discovered, progress towards goals, and return patient to prior level of function.    Personal Factors and Comorbidities Age;Behavior Pattern;Fitness;Transportation;Time since onset of injury/illness/exacerbation    Examination-Activity Limitations Toileting;Dressing;Sit;Transfers;Bed Mobility;Lift;Bend;Squat;Locomotion  Level;Stairs;Carry;Stand;Reach Overhead    Examination-Participation Restrictions Yard Work;Community Activity;Shop;Driving    Stability/Clinical Decision Making Stable/Uncomplicated    Clinical Decision Making Moderate    Rehab Potential Good    PT Frequency 2x / week    PT Duration 8 weeks    PT Treatment/Interventions ADLs/Self Care Home Management;Cryotherapy;Electrical Stimulation;Moist Heat;DME Instruction;Gait training;Stair training;Functional mobility training;Therapeutic activities;Therapeutic exercise;Balance training;Neuromuscular re-education;Patient/family education;Manual techniques;Passive range of motion;Dry needling;Taping;Energy conservation;Ultrasound    PT Next Visit Plan Begin Exercises to increase R strength    PT Home Exercise Plan STS and walking with AD    Consulted and Agree with Plan of Care Patient           Patient will benefit from skilled therapeutic intervention in order to improve the following deficits and impairments:  Decreased endurance, Decreased mobility, Abnormal gait, Difficulty walking, Decreased range of motion, Obesity, Decreased activity tolerance, Decreased knowledge of use of DME, Decreased strength, Postural dysfunction, Pain, Improper body mechanics, Hypomobility, Decreased balance, Impaired perceived functional ability, Decreased safety awareness  Visit Diagnosis: Muscle weakness (generalized)  Difficulty in walking, not elsewhere classified  Problem List Patient Active Problem List   Diagnosis Date Noted  . Protrusion of thoracic intervertebral disc 08/02/2020  . CVA (cerebral vascular accident) (HCC) 07/31/2020  . Urinary frequency 05/21/2020  . Unilateral primary osteoarthritis, left knee 03/17/2020  . Left knee pain 12/11/2019  . Swelling of lower leg 12/04/2019  . Pain and swelling of lower leg, left 12/04/2019  . Insomnia 08/28/2019  . 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  HTN (hypertension) 10/22/2015  . Type 1 diabetes (HCC) 10/22/2015  . Anemia 10/22/2015  . Restless legs 10/22/2015  . Hematuria 04/09/2015   1:46 PM, 08/12/20 Luanna Salk, SPT Student Physical Therapist [place]  [phone]  Luanna Salk 08/12/2020, 1:45 PM  [place] [facility] SP[facility]TS MEDICINE [address] [place], [address] Phone: [phone]   Fax:  [phone]  Name: Courtney Stafford MRN: [id number] Date of Birth: 14-Nov-1971

## 2020-08-13 ENCOUNTER — Ambulatory Visit: Payer: Self-pay | Admitting: Licensed Clinical Social Worker

## 2020-08-13 ENCOUNTER — Other Ambulatory Visit: Payer: Self-pay

## 2020-08-13 ENCOUNTER — Telehealth: Payer: Self-pay | Admitting: Licensed Clinical Social Worker

## 2020-08-13 DIAGNOSIS — F411 Generalized anxiety disorder: Secondary | ICD-10-CM

## 2020-08-13 NOTE — BH Specialist Note (Signed)
Integrated Behavioral Health Follow Up Visit  MRN: [id number] Name: Courtney Stafford    Type of Service: Integrated Behavioral Health- Individual/Family Interpretor:No. Interpretor Name and Language:  SUBJECTIVE: Courtney Stafford is a 49 y.o. female accompanied by herself  Patient was referred by Hurman Horn, NP  for mental health Patient reports the following symptoms/concerns: The patient reports she had a stroke and recalled how she could not move her right side when she woke up a couple of weeks ago. She states she  went to the emergency room on 07/31/2020 and found out she had a slight stroke. The patient states the hospital kept her for three days. She says she is "still feeling weak." The patient states her right hand was affected and she can not write well. She said she was able to resume going to physical therapy yesterday. She said she is "glad they are going to help her work on some stuff."  The patient states her left knee is hurting more because of her stoke. The patient states she does not have anyone to help her around the house while she recovers. She states,  "I am doing the best I can to do little stuff, but is hard and it is very hard to take a shower." The patient reports that it is taking her a long time to fall asleep at night and wakes up a lot. She states she gets about four hours of sleep a night. The patient denies any suicidal or homicidal thoughts.  Duration of problem: ; Severity of problem: moderate  OBJECTIVE: Mood: Euthymic and Affect: Appropriate Risk of harm to self or others: No plan to harm self or others  LIFE CONTEXT: Family and Social: see above School/Work: see above  Self-Care: see above Life Changes: see above  GOALS ADDRESSED: Patient will: 1.  Reduce symptoms of: anxiety and depression  2.  Increase knowledge and/or ability of: coping skills, healthy habits and self-management skills  3.  Demonstrate ability to: Increase healthy adjustment  to current life circumstances  INTERVENTIONS: Interventions utilized:  Supportive Counseling was utilized by the clinician during today's follow up session. Clinician processed with the patient how she has been doing since the last follow up session. Clinician offered praise for the patient contiuning her physical therapy exercises at home and working to improve her health. The clinician suggested that the patient attend a support group to assist her with the grief over the loss of her son. The clinician informed the patient of several local support groups and provided information on each.The clinician encouraged the patient to increase her support system and to discussed setting this as a goal to work towards.The clinician measured the patients anxiety and depression on a numerical scale.  Standardized Assessments completed: GAD-7 and PHQ 9 Gad- 7 16  PHQ- 9 15  ASSESSMENT: Patient currently experiencing see above.   Patient may benefit from see above  PLAN: 1. Follow up with behavioral health clinician on : 08/27/2020 at 2:00 pm  2. Behavioral recommendations:  3. Referral(s): Integrated [employer] (In Clinic) 4. "From scale of 1-10, how likely are you to follow plan?":  Judith Part, Student-Social Work

## 2020-08-13 NOTE — Telephone Encounter (Signed)
Called patient to confirm that she is wanting to take part in Grief Share held at [facility]. [employer].  Patient confirmed that she does want to take part and wants to accept the transportation provided by [facility].  Patient gave me verbal Informed Consent to participate in the [place] [facility] referral program.

## 2020-08-14 ENCOUNTER — Ambulatory Visit: Payer: Self-pay

## 2020-08-14 NOTE — Telephone Encounter (Signed)
Called [employer] and talked to [place] and gave her patients name and informed her that per previous email correspondence with [place], patient needs transportation from her home to [place]. [employer] for a Grief Share Support group beginning this Monday, October 4th and continuing weekly until December 6th from 6:30 pm - 8:00 pm. Informed Sigurd Sos that patient will also need assistance from her door and to the vehicle and vice versa.  Sigurd Sos will check on this and should call back today before 6 pm.

## 2020-08-19 ENCOUNTER — Ambulatory Visit: Payer: Self-pay | Attending: Gerontology

## 2020-08-19 ENCOUNTER — Other Ambulatory Visit: Payer: Self-pay

## 2020-08-19 DIAGNOSIS — M25562 Pain in left knee: Secondary | ICD-10-CM | POA: Insufficient documentation

## 2020-08-19 DIAGNOSIS — M25662 Stiffness of left knee, not elsewhere classified: Secondary | ICD-10-CM | POA: Insufficient documentation

## 2020-08-19 DIAGNOSIS — M6281 Muscle weakness (generalized): Secondary | ICD-10-CM | POA: Insufficient documentation

## 2020-08-19 DIAGNOSIS — G8929 Other chronic pain: Secondary | ICD-10-CM | POA: Insufficient documentation

## 2020-08-19 DIAGNOSIS — R262 Difficulty in walking, not elsewhere classified: Secondary | ICD-10-CM | POA: Insufficient documentation

## 2020-08-19 NOTE — Telephone Encounter (Addendum)
Called Courtney Stafford at [facility] transportation to get clarification on procedures when picking patient up for patient's peace of mind.  Reminded Sigurd Sos that patient has a walker and may need assistance to and from vehicle.

## 2020-08-19 NOTE — Telephone Encounter (Signed)
Called patient to give her the answers to her [facility] transportation questions, Informed her that her transportation will be [employer] and provided her with their phone number.  Also informed patient that if she has any questions or concerns don't hesitate to call [facility] Transportation.

## 2020-08-19 NOTE — Telephone Encounter (Signed)
Called patient to confirm if she still wants to go to the Grief Share support group and informed her that if she does, I can set up [facility] transportation for her and left clinic phone number.

## 2020-08-19 NOTE — Telephone Encounter (Signed)
Patient called back in response to message left earlier for her.  She had some questions about her transportation for next Monday, October 11th.  She will be contacted again, once I get the answers to her questions from transportation.

## 2020-08-19 NOTE — Therapy (Signed)
[facility] PH[facility]ORTS MEDICINE [address], [place], [address] Phone: [phone]   Fax:  [phone]  Physical Therapy Treatment  Patient Details  Name: Courtney Stafford MRN: [id number] Date of Birth: 1971/10/27 Referring Provider (PT): Lewie Chamber, MD   Encounter Date: 08/19/2020   PT End of Session - 08/19/20 1035    Visit Number 2    Number of Visits 17    Date for PT Re-Evaluation 10/07/20    PT Start Time 1030    PT Stop Time 1115    PT Time Calculation (min) 45 min    Equipment Utilized During Treatment Gait belt    Activity Tolerance Patient tolerated treatment well;No increased pain    Behavior During Therapy WFL for tasks assessed/performed           Past Medical History:  Diagnosis Date  . Diabetes mellitus without complication (HCC)   . Hypertension     Past Surgical History:  Procedure Laterality Date  . CARPAL TUNNEL RELEASE  6 years ago   both hands    There were no vitals filed for this visit.   Subjective Assessment - 08/19/20 1034    Subjective Patient reports that she has been compliant with HEP and that she had an episode of waking up on the floor and not knowing how she ended up on the floor last week.    Pertinent History Recent CVA 07/30/20.  Left knee pain. Pt was seen by Allie Bossier in May 2021, who diagnosed knee OA questionable need for TKA in future, has recommended conservative management at this point. Pt had a cortizone injection in May (without avail) and is shceduled for a 'gel' injection on 6/28.    Limitations Walking;Lifting;Standing;House hold activities    How long can you sit comfortably? unlimited    How long can you stand comfortably? ~5 minutes    How long can you walk comfortably? ~2-3 minutes    Diagnostic tests Diagnosed CVA    Patient Stated Goals To be able to walk without the use of an assitive device.    Currently in Pain? No/denies    Pain Onset More than a  month ago           Therapeutic Exercise -NuStep x5 minutes to help increase muscular endurance and strength (Seat 5 and handles 5)  -STS 2x8 -Stairs with AD x2 -Standing Hip Extension 2x10 -Standing Hip Abduction 2x10  -R Shoulder Flexion AAROM with PVC x12  -R Bicep Curl 2x10 3# -R Triceps Extension 2x10 2# -R Forearm Pronation/Supination 2x10 3# -Gait training with AD x39minutes    Grip Strength: L UE: 30lbs      R UE: 25lbs   Performed Exercises to improve endurance and strength of LE's/UE's         PT Education - 08/19/20 1034    Education Details Form/technique with exercise    Person(s) Educated Patient    Methods Explanation;Demonstration    Comprehension Verbalized understanding;Returned demonstration            PT Short Term Goals - 08/12/20 1144      PT SHORT TERM GOAL #1   Title Patient will demonstrate independence with HEP to maximize rehab potential.    Time 4    Period Weeks    Status New    Target Date 09/09/20             PT Long Term Goals - 08/12/20 1145  PT LONG TERM GOAL #1   Title Patient will demonstrate independence with progressive HEP to maintain and progress improvements achieved during therapy.    Time 8    Period Weeks    Status New    Target Date 10/07/20      PT LONG TERM GOAL #2   Title Patient will have a FOTO score of >68/100 to show an increase in independence while performing functional daily activities.    Baseline 08/12/20: 49    Time 8    Period Weeks    Status New    Target Date 10/07/20      PT LONG TERM GOAL #3   Title Patient will demonstrate improved facility and steadiness in ambulation while performing at a gait speed of >0.23m/s.    Baseline 08/12/20: 0.37 m/s    Time 8    Period Weeks    Status New    Target Date 10/07/20      PT LONG TERM GOAL #4   Title Patient will improve 5xSTS and TUG score to show an increase in patient strength/power to decrease patients fall risk.    Baseline  08/12/20: 5xSTS: 21.5sec, TUG: 25.7sec    Time 8    Period Weeks    Status New      PT LONG TERM GOAL #5   Title Patient will be able to reach up to grab items out of her cabinets with R UE at home to show an improvement in her strength and ROM to perform task independently.    Baseline 90 Degrees of Flexion due to weakness/unable to reach up to cabinet    Time 8    Period Weeks    Status New    Target Date 10/07/20                 Plan - 08/19/20 1037    Clinical Impression Statement Review HEP and patient demonstrated good compliance.  Began strengthening for LE's and UE's during today's session at which patient tolerated well. Both patients R UE and LE fatigued easily during exercises, however, patient was able to complete session with appropriate rest breaks. Patient R UE and LE remain limited in both strength and ROM that's due to weakness. Performed Grip Strength during session due to patients concerns of holding onto jugs.  Patients R UE is 5 lbs weaker than her L UE and she is right hand dominant. Patient also presents with some hyperextension of R LE during ambulation. Patient will continue to benefit from skilled therapy to address limitations and return to prior level of function.    Personal Factors and Comorbidities Age;Behavior Pattern;Fitness;Transportation;Time since onset of injury/illness/exacerbation    Examination-Activity Limitations Toileting;Dressing;Sit;Transfers;Bed Mobility;Lift;Bend;Squat;Locomotion Level;Stairs;Carry;Stand;Reach Overhead    Examination-Participation Restrictions Yard Work;Community Activity;Shop;Driving    Stability/Clinical Decision Making Stable/Uncomplicated    Clinical Decision Making Moderate    Rehab Potential Good    PT Frequency 2x / week    PT Duration 8 weeks    PT Treatment/Interventions ADLs/Self Care Home Management;Cryotherapy;Electrical Stimulation;Moist Heat;DME Instruction;Gait training;Stair training;Functional mobility  training;Therapeutic activities;Therapeutic exercise;Balance training;Neuromuscular re-education;Patient/family education;Manual techniques;Passive range of motion;Dry needling;Taping;Energy conservation;Ultrasound    PT Next Visit Plan Exercises to increase R strength    PT Home Exercise Plan STS and walking with AD    Consulted and Agree with Plan of Care Patient           Patient will benefit from skilled therapeutic intervention in order to improve the following deficits and impairments:  Decreased endurance, Decreased mobility, Abnormal gait, Difficulty walking, Decreased range of motion, Obesity, Decreased activity tolerance, Decreased knowledge of use of DME, Decreased strength, Postural dysfunction, Pain, Improper body mechanics, Hypomobility, Decreased balance, Impaired perceived functional ability, Decreased safety awareness  Visit Diagnosis: Muscle weakness (generalized)  Difficulty in walking, not elsewhere classified     Problem List Patient Active Problem List   Diagnosis Date Noted  . Protrusion of thoracic intervertebral disc 08/02/2020  . CVA (cerebral vascular accident) (HCC) 07/31/2020  . Urinary frequency 05/21/2020  . Unilateral primary osteoarthritis, left knee 03/17/2020  . Left knee pain 12/11/2019  . Swelling of lower leg 12/04/2019  . Pain and swelling of lower leg, left 12/04/2019  . Insomnia 08/28/2019  . 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 HTN (hypertension) 10/22/2015  . Type 1 diabetes (HCC) 10/22/2015  . Anemia 10/22/2015  . Restless legs 10/22/2015  . Hematuria 04/09/2015   11:17 AM, 08/19/20 Luanna Salk, SPT Student Physical Therapist [place]  [phone]  Luanna Salk 08/19/2020, 11:16 AM  [place] [facility] PHYSICAL AND SPORTS MEDICINE [address], [place], [address] Phone: [phone]   Fax:  [phone]  Name: Courtney Stafford MRN:  [id number] Date of Birth: 1971-09-12

## 2020-08-19 NOTE — Telephone Encounter (Signed)
Courtney Stafford informed me that patient had been rescheduled for transportation for October 11th to attend Grief Share Group.

## 2020-08-21 ENCOUNTER — Other Ambulatory Visit: Payer: Self-pay

## 2020-08-21 ENCOUNTER — Ambulatory Visit: Payer: Self-pay

## 2020-08-21 DIAGNOSIS — M6281 Muscle weakness (generalized): Secondary | ICD-10-CM

## 2020-08-21 DIAGNOSIS — R262 Difficulty in walking, not elsewhere classified: Secondary | ICD-10-CM

## 2020-08-21 NOTE — Therapy (Signed)
[place] [facility]TS MEDICINE [address], [place], [address] Phone: [phone]   Fax:  [phone]  Physical Therapy Treatment  Patient Details  Name: Courtney Stafford MRN: [id number] Date of Birth: 1971/06/25 Referring Provider (PT): Lewie Chamber, MD   Encounter Date: 08/21/2020   PT End of Session - 08/21/20 0951    Visit Number 3    Number of Visits 17    Date for PT Re-Evaluation 10/07/20    PT Start Time 0945    PT Stop Time 1030    PT Time Calculation (min) 45 min    Equipment Utilized During Treatment Gait belt    Activity Tolerance Patient tolerated treatment well;No increased pain    Behavior During Therapy WFL for tasks assessed/performed           Past Medical History:  Diagnosis Date  . Diabetes mellitus without complication (HCC)   . Hypertension     Past Surgical History:  Procedure Laterality Date  . CARPAL TUNNEL RELEASE  6 years ago   both hands    There were no vitals filed for this visit.   Subjective Assessment - 08/21/20 0950    Subjective Patient reports that shes feeling good and was able to get her shower chair put together and has helped decrease her fear of showering/bathing.    Pertinent History Recent CVA 07/30/20.  Left knee pain. Pt was seen by Allie Bossier in May 2021, who diagnosed knee OA questionable need for TKA in future, has recommended conservative management at this point. Pt had a cortizone injection in May (without avail) and is shceduled for a 'gel' injection on 6/28.    Limitations Walking;Lifting;Standing;House hold activities    How long can you sit comfortably? unlimited    How long can you stand comfortably? ~5 minutes    How long can you walk comfortably? ~2-3 minutes    Diagnostic tests Diagnosed CVA    Patient Stated Goals To be able to walk without the use of an assitive device.    Currently in Pain? No/denies    Pain Onset More than a month ago           Therapeutic Exercise -NuStep x5 minutes to help increase muscular endurance and strength LEVEL 2 (Seat 5 and handles 5)  -STS 2x10 (arms crossed on chest using Green Chair)  -Ambulation with 2WW x2106ft  -Ambulation with SPC  -Stairs with AD x2 -Standing Hip Extension 2x12 -Standing Hip Abduction 2x10 -Seated Flexion in Scaption 2x10 1# -R Bicep Curl 2x12 3# -R Forearm Pronation/Supination 2x10 3# -Wrist Extension x12 3# -Wrist Flexion x12 3#   Performed Exercises to improve endurance and strength of LE's/UE's    PT Education - 08/21/20 0951    Education Details Form/technique with exercise    Person(s) Educated Patient    Methods Explanation;Demonstration    Comprehension Verbalized understanding;Returned demonstration            PT Short Term Goals - 08/12/20 1144      PT SHORT TERM GOAL #1   Title Patient will demonstrate independence with HEP to maximize rehab potential.    Time 4    Period Weeks    Status New    Target Date 09/09/20             PT Long Term Goals - 08/12/20 1145      PT LONG TERM GOAL #1   Title Patient will demonstrate independence with progressive  HEP to maintain and progress improvements achieved during therapy.    Time 8    Period Weeks    Status New    Target Date 10/07/20      PT LONG TERM GOAL #2   Title Patient will have a FOTO score of >68/100 to show an increase in independence while performing functional daily activities.    Baseline 08/12/20: 49    Time 8    Period Weeks    Status New    Target Date 10/07/20      PT LONG TERM GOAL #3   Title Patient will demonstrate improved facility and steadiness in ambulation while performing at a gait speed of >0.62m/s.    Baseline 08/12/20: 0.37 m/s    Time 8    Period Weeks    Status New    Target Date 10/07/20      PT LONG TERM GOAL #4   Title Patient will improve 5xSTS and TUG score to show an increase in patient strength/power to decrease patients fall risk.     Baseline 08/12/20: 5xSTS: 21.5sec, TUG: 25.7sec    Time 8    Period Weeks    Status New      PT LONG TERM GOAL #5   Title Patient will be able to reach up to grab items out of her cabinets with R UE at home to show an improvement in her strength and ROM to perform task independently.    Baseline 90 Degrees of Flexion due to weakness/unable to reach up to cabinet    Time 8    Period Weeks    Status New    Target Date 10/07/20                 Plan - 08/21/20 1035    Clinical Impression Statement Continued with focusing on improving patient's overall endurance along with UE and LE strength of affected side.  Patient required less verbal cueing for ambulation with AD and ascending/descending stairs.  Patient's strength and endurance is improving, however, remains a limiting factor for safe community ambulation. Patient was able to walk with [facility] but required very heavy cueing for gait pattern.  Will continue to address in future sessions. Patient will continue to benefit from skilled therapy to address limitations to return to prior level of function.    Personal Factors and Comorbidities Age;Behavior Pattern;Fitness;Transportation;Time since onset of injury/illness/exacerbation    Examination-Activity Limitations Toileting;Dressing;Sit;Transfers;Bed Mobility;Lift;Bend;Squat;Locomotion Level;Stairs;Carry;Stand;Reach Overhead    Examination-Participation Restrictions Yard Work;Community Activity;Shop;Driving    Stability/Clinical Decision Making Stable/Uncomplicated    Clinical Decision Making Moderate    Rehab Potential Good    PT Frequency 2x / week    PT Duration 8 weeks    PT Treatment/Interventions ADLs/Self Care Home Management;Cryotherapy;Electrical Stimulation;Moist Heat;DME Instruction;Gait training;Stair training;Functional mobility training;Therapeutic activities;Therapeutic exercise;Balance training;Neuromuscular re-education;Patient/family education;Manual techniques;Passive  range of motion;Dry needling;Taping;Energy conservation;Ultrasound    PT Next Visit Plan Exercises to increase R strength    PT Home Exercise Plan STS and walking with AD    Consulted and Agree with Plan of Care Patient           Patient will benefit from skilled therapeutic intervention in order to improve the following deficits and impairments:  Decreased endurance, Decreased mobility, Abnormal gait, Difficulty walking, Decreased range of motion, Obesity, Decreased activity tolerance, Decreased knowledge of use of DME, Decreased strength, Postural dysfunction, Pain, Improper body mechanics, Hypomobility, Decreased balance, Impaired perceived functional ability, Decreased safety awareness  Visit Diagnosis: Muscle weakness (  generalized)  Difficulty in walking, not elsewhere classified     Problem List Patient Active Problem List   Diagnosis Date Noted  . Protrusion of thoracic intervertebral disc 08/02/2020  . CVA (cerebral vascular accident) (HCC) 07/31/2020  . Urinary frequency 05/21/2020  . Unilateral primary osteoarthritis, left knee 03/17/2020  . Left knee pain 12/11/2019  . Swelling of lower leg 12/04/2019  . Pain and swelling of lower leg, left 12/04/2019  . Insomnia 08/28/2019  . 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 HTN (hypertension) 10/22/2015  . Type 1 diabetes (HCC) 10/22/2015  . Anemia 10/22/2015  . Restless legs 10/22/2015  . Hematuria 04/09/2015   10:36 AM, 08/21/20 Luanna Salk, SPT Student Physical Therapist [place]  [phone]  Luanna Salk 08/21/2020, 10:35 AM  [place] [facility] PHYSICAL AND SPORTS MEDICINE [address] [place], [address] Phone: [phone]   Fax:  [phone]  Name: Courtney Stafford MRN: [id number] Date of Birth: [birth year]

## 2020-08-26 ENCOUNTER — Ambulatory Visit: Payer: Self-pay

## 2020-08-26 ENCOUNTER — Telehealth: Payer: Self-pay | Admitting: Licensed Clinical Social Worker

## 2020-08-26 NOTE — Telephone Encounter (Signed)
Called [employer] transportation to schedule a ride to and from Grief share group at [facility] for Monday evening from 6:30 pm - 8 pm, October 18th .  Was informed that ride scheduled for yesterday was cancelled so communicated with transportation that I will call back once I make sure patient is still wanting to attend this group.

## 2020-08-27 ENCOUNTER — Other Ambulatory Visit: Payer: Self-pay

## 2020-08-27 ENCOUNTER — Ambulatory Visit: Payer: Self-pay | Admitting: Licensed Clinical Social Worker

## 2020-08-27 DIAGNOSIS — Z634 Disappearance and death of family member: Secondary | ICD-10-CM

## 2020-08-27 DIAGNOSIS — F411 Generalized anxiety disorder: Secondary | ICD-10-CM

## 2020-08-27 DIAGNOSIS — F4321 Adjustment disorder with depressed mood: Secondary | ICD-10-CM

## 2020-08-27 NOTE — BH Specialist Note (Signed)
Integrated Behavioral Health Follow Up Visit  MRN: [id number] Name: Courtney Stafford   Type of Service: Integrated Behavioral Health- Individual/Family Interpretor:No. Interpretor Name and Language:  SUBJECTIVE: Courtney Stafford is a 49 y.o. female accompanied by herself Patient was referred by Hurman Horn, NP  for mental health  Patient reports the following symptoms/concerns: The patient states she got sick after lunch today and this sometimes happens to her and she doesn't understand why.The patient discussed how she has been doing since her stoke. She states that her knees are hurting so bad she cant walk. She discussed  her health stressors and what she is doing in physical therapy to improve her mobility. She states that she has a shower chair now and is not scared that she is going to fall now. She notes that she is eating microwave meals because she can't stand long. She states she is doing to the best she can but feels that it is not enough. The patient discussed her late son's birthday is coming up and that she has been feeling more depressed. The patient denies suicidal or homicidal thoughts.  Duration of problem: ; Severity of problem: moderate  OBJECTIVE: Mood: Euthymic and Affect: Appropriate Risk of harm to self or others: No plan to harm self or others  LIFE CONTEXT: Family and Social: see above School/Work: see above Self-Care: see above Life Changes: see above  GOALS ADDRESSED: Patient will: 1.  Reduce symptoms of: stress  2.  Increase knowledge and/or ability of: coping skills, healthy habits and self-management skills  3.  Demonstrate ability to: Increase healthy adjustment to current life circumstances and Begin healthy grieving over loss  INTERVENTIONS: Interventions utilized:  Supportive Counseling  was utilized by the clinician during today's follow up session. Clinician processed with the patient regarding how she has been doing since the last follow up  session. Clinician measured the patient's anxiety and depression on a numerical scale. Clinician asked the patient how things with physical therapy have been going and has she noticed a difference in her mobility since she started going back. Clinician encouraged the patient to focus on the positives rather than the negatives in her life. Clinician suggested that the patient write a letter to her late son on his birthday and focus on celebrating his life. Standardized Assessments completed: GAD-7 and PHQ 9  GAD-7 20 PHQ-9  20  ASSESSMENT: Patient currently experiencing see above.   Patient may benefit from see above.  PLAN: 1. Follow up with behavioral health clinician on : 09/17/2020 at 11:00 am 2. Behavioral recommendations:  3. Referral(s): Integrated [employer] (In Clinic) 4. "From scale of 1-10, how likely are you to follow plan?":  Judith Part, Student-Social Work

## 2020-08-27 NOTE — Telephone Encounter (Signed)
Called patient to confirm that she is still wanting to attend the Grief share meetings at [facility] and she would like to wait a while.  I encouraged patient to call social work Tax inspector or social work MSW intern when she is ready to begin attending.  Then called [employer] and advised Sigurd Sos that patient would not need weekly transportation at this time to attend the Grief share meetings.

## 2020-08-28 ENCOUNTER — Ambulatory Visit: Payer: Self-pay

## 2020-08-28 ENCOUNTER — Other Ambulatory Visit: Payer: Self-pay

## 2020-08-28 DIAGNOSIS — M6281 Muscle weakness (generalized): Secondary | ICD-10-CM

## 2020-08-28 DIAGNOSIS — R262 Difficulty in walking, not elsewhere classified: Secondary | ICD-10-CM

## 2020-08-28 NOTE — Therapy (Signed)
[facility] [address] [place], [address] Phone: [phone]   Fax:  [phone]  Physical Therapy Treatment  Patient Details  Name: DESSIREE SZE MRN: [id number] Date of Birth: February 28, 1971 Referring Provider (PT): Lewie Chamber, MD   Encounter Date: 08/28/2020   PT End of Session - 08/28/20 0921    Visit Number 4    Number of Visits 17    Date for PT Re-Evaluation 10/07/20    PT Start Time 0917    PT Stop Time 1002    PT Time Calculation (min) 45 min    Equipment Utilized During Treatment Gait belt    Activity Tolerance Patient tolerated treatment well;No increased pain    Behavior During Therapy WFL for tasks assessed/performed           Past Medical History:  Diagnosis Date  . Diabetes mellitus without complication (HCC)   . Hypertension     Past Surgical History:  Procedure Laterality Date  . CARPAL TUNNEL RELEASE  6 years ago   both hands    There were no vitals filed for this visit.   Subjective Assessment - 08/28/20 0919    Subjective Patient reports tingling down her R arm anterior and posterior aspect.  Patient reports shes been perform ADL's with R arm as much as she can until fatigue.    Pertinent History Recent CVA 07/30/20.  Left knee pain. Pt was seen by Allie Bossier in May 2021, who diagnosed knee OA questionable need for TKA in future, has recommended conservative management at this point. Pt had a cortizone injection in May (without avail) and is shceduled for a 'gel' injection on 6/28.    Limitations Walking;Lifting;Standing;House hold activities    How long can you sit comfortably? unlimited    How long can you stand comfortably? ~5 minutes    How long can you walk comfortably? ~2-3 minutes    Diagnostic tests Diagnosed CVA    Patient Stated Goals To be able to walk without the use of an assitive device.    Currently in Pain? No/denies    Pain Onset More than a month  ago           Therapeutic Exercise -NuStep x5 minutes to help increase muscular endurance and strength LEVEL 3(Seat 5 and handles 5)  -STS 2x10 10# (Grey Chair)   -Ambulation with SPC x336ft -RDL to work on bending 2x10 10# -Stairs with AD x2 (Walker and SPC) -Seated Shoulder Flexion 2x10 2# DB's -Seated Shoulder Abduction 2x10 2# DB's -R Bicep Curl 2x12 3#   Performed Exercises to improve endurance and strength of LE's/UE's    PT Education - 08/28/20 0920    Education Details Form/Technique with Exercise    Person(s) Educated Patient    Methods Explanation;Demonstration    Comprehension Verbalized understanding;Returned demonstration            PT Short Term Goals - 08/12/20 1144      PT SHORT TERM GOAL #1   Title Patient will demonstrate independence with HEP to maximize rehab potential.    Time 4    Period Weeks    Status New    Target Date 09/09/20             PT Long Term Goals - 08/12/20 1145      PT LONG TERM GOAL #1   Title Patient will demonstrate independence with progressive HEP to maintain and progress improvements achieved during therapy.  Time 8    Period Weeks    Status New    Target Date 10/07/20      PT LONG TERM GOAL #2   Title Patient will have a FOTO score of >68/100 to show an increase in independence while performing functional daily activities.    Baseline 08/12/20: 49    Time 8    Period Weeks    Status New    Target Date 10/07/20      PT LONG TERM GOAL #3   Title Patient will demonstrate improved facility and steadiness in ambulation while performing at a gait speed of >0.58m/s.    Baseline 08/12/20: 0.37 m/s    Time 8    Period Weeks    Status New    Target Date 10/07/20      PT LONG TERM GOAL #4   Title Patient will improve 5xSTS and TUG score to show an increase in patient strength/power to decrease patients fall risk.    Baseline 08/12/20: 5xSTS: 21.5sec, TUG: 25.7sec    Time 8    Period Weeks    Status New       PT LONG TERM GOAL #5   Title Patient will be able to reach up to grab items out of her cabinets with R UE at home to show an improvement in her strength and ROM to perform task independently.    Baseline 90 Degrees of Flexion due to weakness/unable to reach up to cabinet    Time 8    Period Weeks    Status New    Target Date 10/07/20                 Plan - 08/28/20 1008    Clinical Impression Statement Continued to focus on improving patient's strength and endurance during today's session.  Patient required verbal cueing to perform exercises correctly to target intended musculature.  Patient educated on using AD when performing stairs and activities out in community to be as safe as possible due to patient report of not using AD correctly. Patient's improving; however, she requires many rest breaks to complete session due to fatigue and SOB.  Patient will benefit from skilled therapy to return to prior level of function.    Personal Factors and Comorbidities Age;Behavior Pattern;Fitness;Transportation;Time since onset of injury/illness/exacerbation    Examination-Activity Limitations Toileting;Dressing;Sit;Transfers;Bed Mobility;Lift;Bend;Squat;Locomotion Level;Stairs;Carry;Stand;Reach Overhead    Examination-Participation Restrictions Yard Work;Community Activity;Shop;Driving    Stability/Clinical Decision Making Stable/Uncomplicated    Clinical Decision Making Moderate    Rehab Potential Good    PT Frequency 2x / week    PT Duration 8 weeks    PT Treatment/Interventions ADLs/Self Care Home Management;Cryotherapy;Electrical Stimulation;Moist Heat;DME Instruction;Gait training;Stair training;Functional mobility training;Therapeutic activities;Therapeutic exercise;Balance training;Neuromuscular re-education;Patient/family education;Manual techniques;Passive range of motion;Dry needling;Taping;Energy conservation;Ultrasound    PT Next Visit Plan Exercises to increase R strength    PT  Home Exercise Plan STS and walking with AD    Consulted and Agree with Plan of Care Patient           Patient will benefit from skilled therapeutic intervention in order to improve the following deficits and impairments:  Decreased endurance, Decreased mobility, Abnormal gait, Difficulty walking, Decreased range of motion, Obesity, Decreased activity tolerance, Decreased knowledge of use of DME, Decreased strength, Postural dysfunction, Pain, Improper body mechanics, Hypomobility, Decreased balance, Impaired perceived functional ability, Decreased safety awareness  Visit Diagnosis: Muscle weakness (generalized)  Difficulty in walking, not elsewhere classified     Problem List Patient  Active Problem List   Diagnosis Date Noted  . Protrusion of thoracic intervertebral disc 08/02/2020  . CVA (cerebral vascular accident) (HCC) 07/31/2020  . Urinary frequency 05/21/2020  . Unilateral primary osteoarthritis, left knee 03/17/2020  . Left knee pain 12/11/2019  . Swelling of lower leg 12/04/2019  . Pain and swelling of lower leg, left 12/04/2019  . Insomnia 08/28/2019  . 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 HTN (hypertension) 10/22/2015  . Type 1 diabetes (HCC) 10/22/2015  . Anemia 10/22/2015  . Restless legs 10/22/2015  . Hematuria 04/09/2015   10:09 AM, 08/28/20 Luanna Salk, SPT Student Physical Therapist [place]  [phone]  Luanna Salk 08/28/2020, 10:08 AM  [place] [facility] PHYSICAL AND SPORTS MEDICINE [address] [place], [address] Phone: [phone]   Fax:  [phone]  Name: DEMEISHA GERAGHTY MRN: [id number] Date of Birth: 1971/07/20

## 2020-09-02 ENCOUNTER — Other Ambulatory Visit: Payer: Self-pay

## 2020-09-02 ENCOUNTER — Ambulatory Visit: Payer: Self-pay

## 2020-09-02 DIAGNOSIS — R262 Difficulty in walking, not elsewhere classified: Secondary | ICD-10-CM

## 2020-09-02 DIAGNOSIS — G8929 Other chronic pain: Secondary | ICD-10-CM

## 2020-09-02 DIAGNOSIS — M25562 Pain in left knee: Secondary | ICD-10-CM

## 2020-09-02 DIAGNOSIS — M6281 Muscle weakness (generalized): Secondary | ICD-10-CM

## 2020-09-02 DIAGNOSIS — M25662 Stiffness of left knee, not elsewhere classified: Secondary | ICD-10-CM

## 2020-09-02 NOTE — Therapy (Signed)
[place] [facility]TS [facility] [address], [place], [address] Phone: [phone]   Fax:  [phone]  Physical Therapy Treatment  Patient Details  Name: Courtney Stafford MRN: [id number] Date of Birth: 02-14-1971 Referring Provider (PT): Lewie Chamber, MD   Encounter Date: 09/02/2020   PT End of Session - 09/02/20 1036    Visit Number 5    Number of Visits 17    Date for PT Re-Evaluation 10/07/20    PT Start Time 1033    PT Stop Time 1115    PT Time Calculation (min) 42 min    Equipment Utilized During Treatment Gait belt    Activity Tolerance Patient tolerated treatment well;No increased pain    Behavior During Therapy WFL for tasks assessed/performed           Past Medical History:  Diagnosis Date  . Diabetes mellitus without complication (HCC)   . Hypertension     Past Surgical History:  Procedure Laterality Date  . CARPAL TUNNEL RELEASE  6 years ago   both hands    There were no vitals filed for this visit.   Subjective Assessment - 09/02/20 1035    Subjective Patient reports that her left knee is painful and that she is seeing progress in her R sided strength.    Pertinent History Recent CVA 07/30/20.  Left knee pain. Pt was seen by Allie Bossier in May 2021, who diagnosed knee OA questionable need for TKA in future, has recommended conservative management at this point. Pt had a cortizone injection in May (without avail) and is shceduled for a 'gel' injection on 6/28.    Limitations Walking;Lifting;Standing;House hold activities    How long can you sit comfortably? unlimited    How long can you stand comfortably? ~5 minutes    How long can you walk comfortably? ~2-3 minutes    Diagnostic tests Diagnosed CVA    Patient Stated Goals To be able to walk without the use of an assitive device.    Currently in Pain? No/denies    Pain Onset More than a month ago           Therapeutic Exercise -NuStep x5  minutes to help increase muscular endurance and strength LEVEL 4(Seat 5 and handles 5)  -STS 2x15 15# Dealer Chair)   -Ambulation with SPC x449ft -RDL to work on bending 1x15 15# -Stairs with AD x4 with SPC  -Seated Shoulder Flexion 2x10 4# DB's -Seated Shoulder Abduction 2x10 2# DB's   Performed Exercises to improve endurance and strength of LE's/UE'   PT Education - 09/02/20 1035    Education Details Form/Technique with Exercise    Person(s) Educated Patient    Methods Explanation;Demonstration    Comprehension Verbalized understanding;Returned demonstration            PT Short Term Goals - 08/12/20 1144      PT SHORT TERM GOAL #1   Title Patient will demonstrate independence with HEP to maximize rehab potential.    Time 4    Period Weeks    Status New    Target Date 09/09/20             PT Long Term Goals - 08/12/20 1145      PT LONG TERM GOAL #1   Title Patient will demonstrate independence with progressive HEP to maintain and progress improvements achieved during therapy.    Time 8    Period Weeks    Status New  Target Date 10/07/20      PT LONG TERM GOAL #2   Title Patient will have a FOTO score of >68/100 to show an increase in independence while performing functional daily activities.    Baseline 08/12/20: 49    Time 8    Period Weeks    Status New    Target Date 10/07/20      PT LONG TERM GOAL #3   Title Patient will demonstrate improved facility and steadiness in ambulation while performing at a gait speed of >0.59m/s.    Baseline 08/12/20: 0.37 m/s    Time 8    Period Weeks    Status New    Target Date 10/07/20      PT LONG TERM GOAL #4   Title Patient will improve 5xSTS and TUG score to show an increase in patient strength/power to decrease patients fall risk.    Baseline 08/12/20: 5xSTS: 21.5sec, TUG: 25.7sec    Time 8    Period Weeks    Status New      PT LONG TERM GOAL #5   Title Patient will be able to reach up to grab items out  of her cabinets with R UE at home to show an improvement in her strength and ROM to perform task independently.    Baseline 90 Degrees of Flexion due to weakness/unable to reach up to cabinet    Time 8    Period Weeks    Status New    Target Date 10/07/20                 Plan - 09/02/20 1037    Clinical Impression Statement Continued to focus on improving patient's strength and endurance during session.  Patient requires verbal cues to perform stairs training with AD and gait training with AD for correct gait pattern.  Patient is showing progress in strength and endurance; however, she is still limited in performing IADL's independently and fatigues quickly with exercises. Patient will continue to benefit from skilled therapy to return to prior level of function.    Personal Factors and Comorbidities Age;Behavior Pattern;Fitness;Transportation;Time since onset of injury/illness/exacerbation    Examination-Activity Limitations Toileting;Dressing;Sit;Transfers;Bed Mobility;Lift;Bend;Squat;Locomotion Level;Stairs;Carry;Stand;Reach Overhead    Examination-Participation Restrictions Yard Work;Community Activity;Shop;Driving    Stability/Clinical Decision Making Stable/Uncomplicated    Clinical Decision Making Moderate    Rehab Potential Good    PT Frequency 2x / week    PT Duration 8 weeks    PT Treatment/Interventions ADLs/Self Care Home Management;Cryotherapy;Electrical Stimulation;Moist Heat;DME Instruction;Gait training;Stair training;Functional mobility training;Therapeutic activities;Therapeutic exercise;Balance training;Neuromuscular re-education;Patient/family education;Manual techniques;Passive range of motion;Dry needling;Taping;Energy conservation;Ultrasound    PT Next Visit Plan Exercises to increase R strength    PT Home Exercise Plan STS and walking with AD    Consulted and Agree with Plan of Care Patient           Patient will benefit from skilled therapeutic intervention  in order to improve the following deficits and impairments:  Decreased endurance, Decreased mobility, Abnormal gait, Difficulty walking, Decreased range of motion, Obesity, Decreased activity tolerance, Decreased knowledge of use of DME, Decreased strength, Postural dysfunction, Pain, Improper body mechanics, Hypomobility, Decreased balance, Impaired perceived functional ability, Decreased safety awareness  Visit Diagnosis: Muscle weakness (generalized)  Difficulty in walking, not elsewhere classified  Chronic pain of left knee  Stiffness of left knee, not elsewhere classified     Problem List Patient Active Problem List   Diagnosis Date Noted  . Protrusion of thoracic intervertebral disc 08/02/2020  .  CVA (cerebral vascular accident) (HCC) 07/31/2020  . Urinary frequency 05/21/2020  . Unilateral primary osteoarthritis, left knee 03/17/2020  . Left knee pain 12/11/2019  . Swelling of lower leg 12/04/2019  . Pain and swelling of lower leg, left 12/04/2019  . Insomnia 08/28/2019  . 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 HTN (hypertension) 10/22/2015  . Type 1 diabetes (HCC) 10/22/2015  . Anemia 10/22/2015  . Restless legs 10/22/2015  . Hematuria 04/09/2015   11:14 AM, 09/02/20 Luanna Salk, SPT Student Physical Therapist [place]  [phone]  Luanna Salk 09/02/2020, 11:12 AM  [place] [facility] [employer] PHYSICAL AND SPORTS MEDICINE [address], [place], [address] Phone: [phone]   Fax:  [phone]  Name: ANISA LEANOS MRN: [id number] Date of Birth: [birth year]

## 2020-09-03 ENCOUNTER — Other Ambulatory Visit: Payer: Self-pay | Admitting: Gerontology

## 2020-09-03 DIAGNOSIS — G2581 Restless legs syndrome: Secondary | ICD-10-CM

## 2020-09-04 ENCOUNTER — Ambulatory Visit: Payer: Self-pay

## 2020-09-08 ENCOUNTER — Telehealth: Payer: Self-pay | Admitting: Pharmacist

## 2020-09-08 NOTE — Telephone Encounter (Signed)
09/08/2020 8:35:16 AM - Lantus Vials refill to provider  -- Annette Johnson - Monday, September 08, 2020 8:33 AM -[employer]  refill  Lantus Vials Inject 28 units daily at bedtime # 4 boxes to [facility] for [place] to sign. 

## 2020-09-08 NOTE — Telephone Encounter (Signed)
09/08/2020 8:35:16 AM - Lantus Vials refill to provider  -- [name] - Monday, September 08, 2020 8:33 AM -[employer]  refill  Lantus Vials Inject 28 units daily at bedtime # 4 boxes to [facility] for [place] to sign.

## 2020-09-09 ENCOUNTER — Ambulatory Visit: Payer: Self-pay

## 2020-09-09 ENCOUNTER — Other Ambulatory Visit: Payer: Self-pay

## 2020-09-09 DIAGNOSIS — R262 Difficulty in walking, not elsewhere classified: Secondary | ICD-10-CM

## 2020-09-09 DIAGNOSIS — M6281 Muscle weakness (generalized): Secondary | ICD-10-CM

## 2020-09-09 NOTE — Therapy (Signed)
[place] [facility] PHYSICAL [facility] SPORTS MEDICINE [address] [place], [address] Phone: [phone]   Fax:  [phone]  Physical Therapy Treatment  Patient Details  Name: Courtney Stafford MRN: [id number] Date of Birth: 01/21/1971 Referring Provider (PT): Lewie Chamber, MD   Encounter Date: 09/09/2020   PT End of Session - 09/09/20 1346    Visit Number 6    Number of Visits 17    Date for PT Re-Evaluation 10/07/20    PT Start Time 1343    PT Stop Time 1428    PT Time Calculation (min) 45 min    Equipment Utilized During Treatment Gait belt    Activity Tolerance Patient tolerated treatment well;No increased pain    Behavior During Therapy WFL for tasks assessed/performed           Past Medical History:  Diagnosis Date  . Diabetes mellitus without complication (HCC)   . Hypertension     Past Surgical History:  Procedure Laterality Date  . CARPAL TUNNEL RELEASE  6 years ago   both hands    There were no vitals filed for this visit.   Subjective Assessment - 09/09/20 1345    Subjective Patient reports shes doing well.    Pertinent History Recent CVA 07/30/20.  Left knee pain. Pt was seen by Allie Bossier in May 2021, who diagnosed knee OA questionable need for TKA in future, has recommended conservative management at this point. Pt had a cortizone injection in May (without avail) and is shceduled for a 'gel' injection on 6/28.    Limitations Walking;Lifting;Standing;House hold activities    How long can you sit comfortably? unlimited    How long can you stand comfortably? ~5 minutes    How long can you walk comfortably? ~2-3 minutes    Diagnostic tests Diagnosed CVA    Patient Stated Goals To be able to walk without the use of an assitive device.    Currently in Pain? No/denies    Pain Onset More than a month ago            Therapeutic Exercise -NuStep x5 minutes to help increase muscular endurance and strength LEVEL 4(Seat 5  and handles 5)  -STS x15 15#, x15 #20 (Green Chair)   -Ambulation with SPC x445ft -RDL to work on bending 1x15 20# -Stairs with AD x4 with SPC  -Seated Shoulder Flexion 2x12 4# DB's -Seated Shoulder Abduction 2x10 3# DB's -Seated Shoulder Press 2x12 3# DB's    Performed Exercises to improve endurance and strength of LE's/UE'     PT Education - 09/09/20 1346    Education Details Form/Technique with Exercise    Person(s) Educated Patient    Methods Explanation;Demonstration    Comprehension Verbalized understanding;Returned demonstration            PT Short Term Goals - 08/12/20 1144      PT SHORT TERM GOAL #1   Title Patient will demonstrate independence with HEP to maximize rehab potential.    Time 4    Period Weeks    Status New    Target Date 09/09/20             PT Long Term Goals - 08/12/20 1145      PT LONG TERM GOAL #1   Title Patient will demonstrate independence with progressive HEP to maintain and progress improvements achieved during therapy.    Time 8    Period Weeks    Status New  Target Date 10/07/20      PT LONG TERM GOAL #2   Title Patient will have a FOTO score of >68/100 to show an increase in independence while performing functional daily activities.    Baseline 08/12/20: 49    Time 8    Period Weeks    Status New    Target Date 10/07/20      PT LONG TERM GOAL #3   Title Patient will demonstrate improved facility and steadiness in ambulation while performing at a gait speed of >0.66m/s.    Baseline 08/12/20: 0.37 m/s    Time 8    Period Weeks    Status New    Target Date 10/07/20      PT LONG TERM GOAL #4   Title Patient will improve 5xSTS and TUG score to show an increase in patient strength/power to decrease patients fall risk.    Baseline 08/12/20: 5xSTS: 21.5sec, TUG: 25.7sec    Time 8    Period Weeks    Status New      PT LONG TERM GOAL #5   Title Patient will be able to reach up to grab items out of her cabinets with R  UE at home to show an improvement in her strength and ROM to perform task independently.    Baseline 90 Degrees of Flexion due to weakness/unable to reach up to cabinet    Time 8    Period Weeks    Status New    Target Date 10/07/20                 Plan - 09/09/20 1422    Clinical Impression Statement Continued to address patient strength and endurance which remain limited demonstrated by fatigue and episodes of SOB throughout session. Patients R sided strength along with endurance is improving, however, she continues to have difficulty with walking long distances.  Patient's gait has improved with SPC but continues to require verbal cueing to correct gait pattern. Patient will continue to benefit from skilled therapy to return to prior level of function.    Personal Factors and Comorbidities Age;Behavior Pattern;Fitness;Transportation;Time since onset of injury/illness/exacerbation    Examination-Activity Limitations Toileting;Dressing;Sit;Transfers;Bed Mobility;Lift;Bend;Squat;Locomotion Level;Stairs;Carry;Stand;Reach Overhead    Examination-Participation Restrictions Yard Work;Community Activity;Shop;Driving    Stability/Clinical Decision Making Stable/Uncomplicated    Clinical Decision Making Moderate    Rehab Potential Good    PT Frequency 2x / week    PT Duration 8 weeks    PT Treatment/Interventions ADLs/Self Care Home Management;Cryotherapy;Electrical Stimulation;Moist Heat;DME Instruction;Gait training;Stair training;Functional mobility training;Therapeutic activities;Therapeutic exercise;Balance training;Neuromuscular re-education;Patient/family education;Manual techniques;Passive range of motion;Dry needling;Taping;Energy conservation;Ultrasound    PT Next Visit Plan Exercises to increase R strength    PT Home Exercise Plan STS and walking with AD    Consulted and Agree with Plan of Care Patient           Patient will benefit from skilled therapeutic intervention in  order to improve the following deficits and impairments:  Decreased endurance, Decreased mobility, Abnormal gait, Difficulty walking, Decreased range of motion, Obesity, Decreased activity tolerance, Decreased knowledge of use of DME, Decreased strength, Postural dysfunction, Pain, Improper body mechanics, Hypomobility, Decreased balance, Impaired perceived functional ability, Decreased safety awareness  Visit Diagnosis: Muscle weakness (generalized)  Difficulty in walking, not elsewhere classified     Problem List Patient Active Problem List   Diagnosis Date Noted  . Protrusion of thoracic intervertebral disc 08/02/2020  . CVA (cerebral vascular accident) (HCC) 07/31/2020  . Urinary frequency 05/21/2020  .  Unilateral primary osteoarthritis, left knee 03/17/2020  . Left knee pain 12/11/2019  . Swelling of lower leg 12/04/2019  . Pain and swelling of lower leg, left 12/04/2019  . Insomnia 08/28/2019  . 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 HTN (hypertension) 10/22/2015  . Type 1 diabetes (HCC) 10/22/2015  . Anemia 10/22/2015  . Restless legs 10/22/2015  . Hematuria 04/09/2015   2:29 PM, 09/09/20 Luanna Salk, SPT Student Physical Therapist [place]  [phone]  Luanna Salk 09/09/2020, 2:23 PM  [place] [facility] PHYSICAL AND SPORTS MEDICINE [address] [place], [address] Phone: [phone]   Fax:  [phone]  Name: Courtney Stafford MRN: [id number] Date of Birth: October 07, 1971

## 2020-09-10 ENCOUNTER — Telehealth: Payer: Self-pay | Admitting: Pharmacist

## 2020-09-10 NOTE — Telephone Encounter (Signed)
09/10/2020 11:57:14 AM - Lantus vials refill to [employer]  -- [name] - Wednesday, September 10, 2020 11:56 AM --Arneta Cliche [employer] refill for Lantus Vials Inject 28 units daily at bedtime # 4 boxes.

## 2020-09-11 ENCOUNTER — Other Ambulatory Visit: Payer: Self-pay

## 2020-09-11 ENCOUNTER — Ambulatory Visit: Payer: Self-pay

## 2020-09-11 DIAGNOSIS — M6281 Muscle weakness (generalized): Secondary | ICD-10-CM

## 2020-09-11 DIAGNOSIS — R262 Difficulty in walking, not elsewhere classified: Secondary | ICD-10-CM

## 2020-09-11 NOTE — Therapy (Signed)
[facility] PH[facility]ORTS MEDICINE [address], [place], [address] Phone: [phone]   Fax:  [phone]  Physical Therapy Treatment  Patient Details  Name: Courtney Stafford MRN: [id number] Date of Birth: 1971-06-11 Referring Provider (PT): Lewie Chamber, MD   Encounter Date: 09/11/2020   PT End of Session - 09/11/20 1519    Visit Number 7    Number of Visits 17    Date for PT Re-Evaluation 10/07/20    PT Start Time 1430    PT Stop Time 1515    PT Time Calculation (min) 45 min    Equipment Utilized During Treatment Gait belt    Activity Tolerance Patient tolerated treatment well;No increased pain    Behavior During Therapy WFL for tasks assessed/performed           Past Medical History:  Diagnosis Date  . Diabetes mellitus without complication (HCC)   . Hypertension     Past Surgical History:  Procedure Laterality Date  . CARPAL TUNNEL RELEASE  6 years ago   both hands    There were no vitals filed for this visit.   Subjective Assessment - 09/11/20 1514    Subjective Patient reports shes been seeing improvements.    Pertinent History Recent CVA 07/30/20.  Left knee pain. Pt was seen by Allie Bossier in May 2021, who diagnosed knee OA questionable need for TKA in future, has recommended conservative management at this point. Pt had a cortizone injection in May (without avail) and is shceduled for a 'gel' injection on 6/28.    Limitations Walking;Lifting;Standing;House hold activities    How long can you sit comfortably? unlimited    How long can you stand comfortably? ~5 minutes    How long can you walk comfortably? ~2-3 minutes    Diagnostic tests Diagnosed CVA    Patient Stated Goals To be able to walk without the use of an assitive device.    Currently in Pain? No/denies    Pain Onset More than a month ago            Therapeutic Exercise -NuStep x5 minutes to help increase muscular endurance and strength  LEVEL 6(Seat 5 and handles 5)  -STS 2x15 #20 (Green Chair)   -Ambulation with SPC x436ft -RDL to work on bending 2x10 20# -Stairs with AD x4 with SPC  -Seated Shoulder Flexion 2x12 4# DB's -Seated Shoulder Abduction 2x10 4# DB's -Seated Shoulder Press 2x12 4# DB's    Performed Exercises to improve endurance and strength of LE's/UE'    PT Education - 09/11/20 1518    Education Details Form/Technique with Exercise    Person(s) Educated Patient    Methods Explanation;Demonstration    Comprehension Verbalized understanding;Returned demonstration            PT Short Term Goals - 08/12/20 1144      PT SHORT TERM GOAL #1   Title Patient will demonstrate independence with HEP to maximize rehab potential.    Time 4    Period Weeks    Status New    Target Date 09/09/20             PT Long Term Goals - 08/12/20 1145      PT LONG TERM GOAL #1   Title Patient will demonstrate independence with progressive HEP to maintain and progress improvements achieved during therapy.    Time 8    Period Weeks    Status New    Target  Date 10/07/20      PT LONG TERM GOAL #2   Title Patient will have a FOTO score of >68/100 to show an increase in independence while performing functional daily activities.    Baseline 08/12/20: 49    Time 8    Period Weeks    Status New    Target Date 10/07/20      PT LONG TERM GOAL #3   Title Patient will demonstrate improved facility and steadiness in ambulation while performing at a gait speed of >0.35m/s.    Baseline 08/12/20: 0.37 m/s    Time 8    Period Weeks    Status New    Target Date 10/07/20      PT LONG TERM GOAL #4   Title Patient will improve 5xSTS and TUG score to show an increase in patient strength/power to decrease patients fall risk.    Baseline 08/12/20: 5xSTS: 21.5sec, TUG: 25.7sec    Time 8    Period Weeks    Status New      PT LONG TERM GOAL #5   Title Patient will be able to reach up to grab items out of her cabinets  with R UE at home to show an improvement in her strength and ROM to perform task independently.    Baseline 90 Degrees of Flexion due to weakness/unable to reach up to cabinet    Time 8    Period Weeks    Status New    Target Date 10/07/20                 Plan - 09/11/20 1520    Clinical Impression Statement Patient is progressing demonstrated by her ability to tolerate a great volume of exercise throughout session.  Patient continues to require verbal cueing with [facility] gait training to correct pattern to become more economic with the [facility].  Patient is able to lift her R UE higher showing improvement in strength and motor control.  Patient's endurance remains limited and requires a seated rest break after ambulating short distances.  Patient will continue to benefit from skilled therapy to return to prior level of function.    Personal Factors and Comorbidities Age;Behavior Pattern;Fitness;Transportation;Time since onset of injury/illness/exacerbation    Examination-Activity Limitations Toileting;Dressing;Sit;Transfers;Bed Mobility;Lift;Bend;Squat;Locomotion Level;Stairs;Carry;Stand;Reach Overhead    Examination-Participation Restrictions Yard Work;Community Activity;Shop;Driving    Stability/Clinical Decision Making Stable/Uncomplicated    Clinical Decision Making Moderate    Rehab Potential Good    PT Frequency 2x / week    PT Duration 8 weeks    PT Treatment/Interventions ADLs/Self Care Home Management;Cryotherapy;Electrical Stimulation;Moist Heat;DME Instruction;Gait training;Stair training;Functional mobility training;Therapeutic activities;Therapeutic exercise;Balance training;Neuromuscular re-education;Patient/family education;Manual techniques;Passive range of motion;Dry needling;Taping;Energy conservation;Ultrasound    PT Next Visit Plan Exercises to increase R strength    PT Home Exercise Plan STS and walking with AD    Consulted and Agree with Plan of Care Patient            Patient will benefit from skilled therapeutic intervention in order to improve the following deficits and impairments:  Decreased endurance, Decreased mobility, Abnormal gait, Difficulty walking, Decreased range of motion, Obesity, Decreased activity tolerance, Decreased knowledge of use of DME, Decreased strength, Postural dysfunction, Pain, Improper body mechanics, Hypomobility, Decreased balance, Impaired perceived functional ability, Decreased safety awareness  Visit Diagnosis: Muscle weakness (generalized)  Difficulty in walking, not elsewhere classified     Problem List Patient Active Problem List   Diagnosis Date Noted  . Protrusion of thoracic intervertebral disc  08/02/2020  . CVA (cerebral vascular accident) (HCC) 07/31/2020  . Urinary frequency 05/21/2020  . Unilateral primary osteoarthritis, left knee 03/17/2020  . Left knee pain 12/11/2019  . Swelling of lower leg 12/04/2019  . Pain and swelling of lower leg, left 12/04/2019  . Insomnia 08/28/2019  . 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 HTN (hypertension) 10/22/2015  . Type 1 diabetes (HCC) 10/22/2015  . Anemia 10/22/2015  . Restless legs 10/22/2015  . Hematuria 04/09/2015   3:21 PM, 09/11/20 Luanna Salk, SPT Student Physical Therapist [place]  [phone]  Luanna Salk 09/11/2020, 3:20 PM  [facility] PHYSICAL AND SPORTS MEDICINE [address] [place], [address] Phone: [phone]   Fax:  [phone]  Name: Courtney Stafford MRN: [id number] Date of Birth: 1971/11/10

## 2020-09-16 ENCOUNTER — Ambulatory Visit: Payer: Self-pay

## 2020-09-17 ENCOUNTER — Ambulatory Visit: Payer: Self-pay | Admitting: Licensed Clinical Social Worker

## 2020-09-18 ENCOUNTER — Ambulatory Visit: Payer: Self-pay | Attending: Gerontology

## 2020-09-18 ENCOUNTER — Other Ambulatory Visit: Payer: Self-pay

## 2020-09-18 ENCOUNTER — Telehealth: Payer: Self-pay

## 2020-09-18 DIAGNOSIS — M6281 Muscle weakness (generalized): Secondary | ICD-10-CM | POA: Insufficient documentation

## 2020-09-18 DIAGNOSIS — R262 Difficulty in walking, not elsewhere classified: Secondary | ICD-10-CM | POA: Insufficient documentation

## 2020-09-18 NOTE — Telephone Encounter (Signed)
Patient called to reschedule her appointment with Rhett Bannister.

## 2020-09-18 NOTE — Therapy (Signed)
[facility] PHYS[facility]ORTS MEDICINE [address] [place], [address] Phone: [phone]   Fax:  [phone]  Physical Therapy Treatment  Patient Details  Name: Courtney Stafford MRN: [id number] Date of Birth: 1971-05-15 Referring Provider (PT): Lewie Chamber, MD   Encounter Date: 09/18/2020   PT End of Session - 09/18/20 1535    Visit Number 8    Number of Visits 17    Date for PT Re-Evaluation 10/07/20    PT Start Time 1530    PT Stop Time 1615    PT Time Calculation (min) 45 min    Equipment Utilized During Treatment Gait belt    Activity Tolerance Patient tolerated treatment well;No increased pain    Behavior During Therapy WFL for tasks assessed/performed           Past Medical History:  Diagnosis Date  . Diabetes mellitus without complication (HCC)   . Hypertension     Past Surgical History:  Procedure Laterality Date  . CARPAL TUNNEL RELEASE  6 years ago   both hands    There were no vitals filed for this visit.   Subjective Assessment - 09/18/20 1534    Subjective Patient reports shes been able to do more tasks around the house.    Pertinent History Recent CVA 07/30/20.  Left knee pain. Pt was seen by Allie Bossier in May 2021, who diagnosed knee OA questionable need for TKA in future, has recommended conservative management at this point. Pt had a cortizone injection in May (without avail) and is shceduled for a 'gel' injection on 6/28.    Limitations Walking;Lifting;Standing;House hold activities    How long can you sit comfortably? unlimited    How long can you stand comfortably? ~5 minutes    How long can you walk comfortably? ~2-3 minutes    Diagnostic tests Diagnosed CVA    Patient Stated Goals To be able to walk without the use of an assitive device.    Currently in Pain? No/denies    Pain Onset More than a month ago           Therapeutic Exercise  -NuStep x5 minutes to help increase muscular  endurance and strength LEVEL 6(Seat 5 and handles 5)  -STS 2x15 #20 (Green Chair)   -Ambulation with SPC x459ft -Hurdles (3 hurdles ~21ft apart) -RDL to work on bending 2x10 20#  -Seated Shoulder Flexion 2x12 4# DB's -Seated Row at [employer] 2x15 15# -Seated Shoulder Press 2x12 4# DB's    Performed Exercises to improve endurance and strength of LE's/UE'   PT Education - 09/18/20 1534    Education Details Form/Technique with Exercise    Person(s) Educated Patient    Methods Explanation;Demonstration    Comprehension Verbalized understanding;Returned demonstration            PT Short Term Goals - 08/12/20 1144      PT SHORT TERM GOAL #1   Title Patient will demonstrate independence with HEP to maximize rehab potential.    Time 4    Period Weeks    Status New    Target Date 09/09/20             PT Long Term Goals - 08/12/20 1145      PT LONG TERM GOAL #1   Title Patient will demonstrate independence with progressive HEP to maintain and progress improvements achieved during therapy.    Time 8    Period Weeks    Status New  Target Date 10/07/20      PT LONG TERM GOAL #2   Title Patient will have a FOTO score of >68/100 to show an increase in independence while performing functional daily activities.    Baseline 08/12/20: 49    Time 8    Period Weeks    Status New    Target Date 10/07/20      PT LONG TERM GOAL #3   Title Patient will demonstrate improved facility and steadiness in ambulation while performing at a gait speed of >0.75m/s.    Baseline 08/12/20: 0.37 m/s    Time 8    Period Weeks    Status New    Target Date 10/07/20      PT LONG TERM GOAL #4   Title Patient will improve 5xSTS and TUG score to show an increase in patient strength/power to decrease patients fall risk.    Baseline 08/12/20: 5xSTS: 21.5sec, TUG: 25.7sec    Time 8    Period Weeks    Status New      PT LONG TERM GOAL #5   Title Patient will be able to reach up to grab items out of  her cabinets with R UE at home to show an improvement in her strength and ROM to perform task independently.    Baseline 90 Degrees of Flexion due to weakness/unable to reach up to cabinet    Time 8    Period Weeks    Status New    Target Date 10/07/20                 Plan - 09/18/20 1620    Clinical Impression Statement Patient is making progress in strength demonstrated by ability to perform exercises with increased resistance.  Ambulation remains difficult for patient due to muscular and cardiovascular endurance.  Patient continues to require verbal cueing to correct gait pattern with SPC.  Patient will benefit from further skilled therapy to return to prior level of function and progress towards goals.    Personal Factors and Comorbidities Age;Behavior Pattern;Fitness;Transportation;Time since onset of injury/illness/exacerbation    Examination-Activity Limitations Toileting;Dressing;Sit;Transfers;Bed Mobility;Lift;Bend;Squat;Locomotion Level;Stairs;Carry;Stand;Reach Overhead    Examination-Participation Restrictions Yard Work;Community Activity;Shop;Driving    Stability/Clinical Decision Making Stable/Uncomplicated    Clinical Decision Making Moderate    Rehab Potential Good    PT Frequency 2x / week    PT Duration 8 weeks    PT Treatment/Interventions ADLs/Self Care Home Management;Cryotherapy;Electrical Stimulation;Moist Heat;DME Instruction;Gait training;Stair training;Functional mobility training;Therapeutic activities;Therapeutic exercise;Balance training;Neuromuscular re-education;Patient/family education;Manual techniques;Passive range of motion;Dry needling;Taping;Energy conservation;Ultrasound    PT Next Visit Plan Exercises to increase R strength, Gait with SPC    PT Home Exercise Plan STS and walking with AD    Consulted and Agree with Plan of Care Patient           Patient will benefit from skilled therapeutic intervention in order to improve the following deficits  and impairments:  Decreased endurance, Decreased mobility, Abnormal gait, Difficulty walking, Decreased range of motion, Obesity, Decreased activity tolerance, Decreased knowledge of use of DME, Decreased strength, Postural dysfunction, Pain, Improper body mechanics, Hypomobility, Decreased balance, Impaired perceived functional ability, Decreased safety awareness  Visit Diagnosis: Muscle weakness (generalized)  Difficulty in walking, not elsewhere classified     Problem List Patient Active Problem List   Diagnosis Date Noted  . Protrusion of thoracic intervertebral disc 08/02/2020  . CVA (cerebral vascular accident) (HCC) 07/31/2020  . Urinary frequency 05/21/2020  . Unilateral primary osteoarthritis, left knee 03/17/2020  .  Left knee pain 12/11/2019  . Swelling of lower leg 12/04/2019  . Pain and swelling of lower leg, left 12/04/2019  . Insomnia 08/28/2019  . 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 HTN (hypertension) 10/22/2015  . Type 1 diabetes (HCC) 10/22/2015  . Anemia 10/22/2015  . Restless legs 10/22/2015  . Hematuria 04/09/2015   4:23 PM, 09/18/20 Luanna Salk, SPT Student Physical Therapist [place]  [phone]  Luanna Salk 09/18/2020, 4:22 PM  [facility] PHYSICAL AND SPORTS MEDICINE [address], [place], [address] Phone: [phone]   Fax:  [phone]  Name: JEMEKA WAGLER MRN: [id number] Date of Birth: October 12, 1971

## 2020-10-01 ENCOUNTER — Ambulatory Visit: Payer: Self-pay | Admitting: Licensed Clinical Social Worker

## 2020-10-01 DIAGNOSIS — F411 Generalized anxiety disorder: Secondary | ICD-10-CM

## 2020-10-01 DIAGNOSIS — F331 Major depressive disorder, recurrent, moderate: Secondary | ICD-10-CM

## 2020-10-01 NOTE — BH Specialist Note (Signed)
Integrated Behavioral Health Follow Up Visit  MRN: [id number] Name: Courtney Stafford    Type of Service: Integrated Behavioral Health- Individual/Family Interpretor:No. Interpretor Name and Language:   SUBJECTIVE: Courtney Stafford is a 49 y.o. female accompanied by herself  Patient was referred by Hurman Horn, NP for mental Health  Patient reports the following symptoms/concerns: The patient states that she has not been doing much since her last session. She said the day of her late son's birthday was difficult and brought up lot of hard emotions. She discussed finical and familial stressors. The patient noted that she has been making progress with her physical therapy.The patient shared concerns that she would not be able to make it to the clinic for her lab work and visit with her primary provider. She notes that having a shower chair has helped her feel more safe and allows her to remain clean. The patient noted that she is looking forward to spending Thanksgiving with her daughter. She stated that she will be able to meet her newest grandson for the first time. The patient denied suicidal or homicidal thoughts.  Duration of problem: ; Severity of problem: moderate  OBJECTIVE: Mood: Euthymic and Affect: Appropriate Risk of harm to self or others: No plan to harm self or others  LIFE CONTEXT: Family and Social:  See above  School/Work: see above Self-Care: see above Life Changes: see above  GOALS ADDRESSED: Patient will: 1.  Reduce symptoms of: anxiety and depression  2.  Increase knowledge and/or ability of: coping skills, healthy habits and self-management skills  3.  Demonstrate ability to: Increase healthy adjustment to current life circumstances  INTERVENTIONS: Interventions utilized:  Supportive Counseling was utilized by the clinician during today's follow up session. Clinician processed with the patient how she has been doing since the last follow up session.Clinician  offered praise for the patient continuing her physical therapy exercises at home and working to improve her health. The clinician measured the patients anxiety and depression on a numerical scale. The clinician offered to put in a [place] transportation request so the patient could come to her lab and doctor appointments. The clinician encouraged the patient to continue to work on self care and increasing her support system.  Standardized Assessments completed: GAD-7 and PHQ 9  ASSESSMENT: Patient currently experiencing see above  Patient may benefit from see above  PLAN: 1. Follow up with behavioral health clinician on : 10/21/2020 at 2:00 PM  2. Behavioral recommendations: 3. Referral(S): Integrated Behavioral Health Services (In Clinic) 4. "From scale of 1-10, how likely are you to follow plan?":  Judith Part, Student-Social Work

## 2020-10-07 ENCOUNTER — Other Ambulatory Visit: Payer: Self-pay

## 2020-10-07 ENCOUNTER — Ambulatory Visit: Payer: Self-pay

## 2020-10-07 ENCOUNTER — Telehealth: Payer: Self-pay

## 2020-10-07 DIAGNOSIS — R262 Difficulty in walking, not elsewhere classified: Secondary | ICD-10-CM

## 2020-10-07 DIAGNOSIS — M6281 Muscle weakness (generalized): Secondary | ICD-10-CM

## 2020-10-07 NOTE — Telephone Encounter (Signed)
Social work Counsellor and talked to [place] to confirm that patient's transportation requests had been received via email.  Drue Stager was able to view the transportation request for December 8th but not for December 1st (both requests were for [facility] appointments).  Social work Tax inspector provided [employer] with the information for the December 1st appointment and was informed by transportation that both of the requests are recorded and the latest the patient should hear from transportation is the day before transportation is needed.  Intern called patient and informed her of the above.

## 2020-10-07 NOTE — Therapy (Signed)
[facility] PH[facility]TS MEDICINE [address] [place], [address] Phone: [phone]   Fax:  [phone]  Physical Therapy Treatment  Patient Details  Name: Courtney Stafford MRN: [id number] Date of Birth: [birth year] Referring Provider (PT): Lewie Chamber, MD   Encounter Date: 10/07/2020   PT End of Session - 10/07/20 1516    Visit Number 9    Number of Visits 17    Date for PT Re-Evaluation 10/07/20    PT Start Time 1115    PT Stop Time 1200    PT Time Calculation (min) 45 min    Equipment Utilized During Treatment Gait belt    Activity Tolerance Patient tolerated treatment well;No increased pain    Behavior During Therapy WFL for tasks assessed/performed           Past Medical History:  Diagnosis Date  . Diabetes mellitus without complication (HCC)   . Hypertension     Past Surgical History:  Procedure Laterality Date  . CARPAL TUNNEL RELEASE  6 years ago   both hands    There were no vitals filed for this visit.   Subjective Assessment - 10/07/20 1346    Subjective Patient states she has intermittent knee pain, however it is not bothering her as much as previously. Patient states she's been trying to exercise when she has time.    Pertinent History Recent CVA 07/30/20.  Left knee pain. Pt was seen by Allie Bossier in May 2021, who diagnosed knee OA questionable need for TKA in future, has recommended conservative management at this point. Pt had a cortizone injection in May (without avail) and is shceduled for a 'gel' injection on 6/28.    Limitations Walking;Lifting;Standing;House hold activities    How long can you sit comfortably? unlimited    How long can you stand comfortably? ~5 minutes    How long can you walk comfortably? ~2-3 minutes    Diagnostic tests Diagnosed CVA    Patient Stated Goals To be able to walk without the use of an assitive device.    Currently in Pain? No/denies    Pain Onset More than  a month ago               TREATMENT    Therapeutic Exercise   -NuStep x10 minutes to help increase muscular endurance and strength LEVEL 3(Seat 5 and handles 5)  -STS 2x15 #20 (Green Chair)  -RDL to work on bending 2x15 20#  -Ambulation around cones diagonally - 2 x 5 (27ft total) -Hurdles (6 hurdles ~48ft apart) x 2  - Monster walks with YTB - x 20    Performed Exercises to improve endurance and strength of LE's/UE'     PT Education - 10/07/20 1516    Education Details form/technique with exercise    Person(s) Educated Patient    Methods Explanation;Demonstration    Comprehension Returned demonstration;Verbalized understanding            PT Short Term Goals - 08/12/20 1144      PT SHORT TERM GOAL #1   Title Patient will demonstrate independence with HEP to maximize rehab potential.    Time 4    Period Weeks    Status New    Target Date 09/09/20             PT Long Term Goals - 08/12/20 1145      PT LONG TERM GOAL #1   Title Patient will demonstrate independence with  progressive HEP to maintain and progress improvements achieved during therapy.    Time 8    Period Weeks    Status New    Target Date 10/07/20      PT LONG TERM GOAL #2   Title Patient will have a FOTO score of >68/100 to show an increase in independence while performing functional daily activities.    Baseline 08/12/20: 49    Time 8    Period Weeks    Status New    Target Date 10/07/20      PT LONG TERM GOAL #3   Title Patient will demonstrate improved facility and steadiness in ambulation while performing at a gait speed of >0.10m/s.    Baseline 08/12/20: 0.37 m/s    Time 8    Period Weeks    Status New    Target Date 10/07/20      PT LONG TERM GOAL #4   Title Patient will improve 5xSTS and TUG score to show an increase in patient strength/power to decrease patients fall risk.    Baseline 08/12/20: 5xSTS: 21.5sec, TUG: 25.7sec    Time 8    Period Weeks    Status New       PT LONG TERM GOAL #5   Title Patient will be able to reach up to grab items out of her cabinets with R UE at home to show an improvement in her strength and ROM to perform task independently.    Baseline 90 Degrees of Flexion due to weakness/unable to reach up to cabinet    Time 8    Period Weeks    Status New    Target Date 10/07/20                 Plan - 10/07/20 1651    Clinical Impression Statement Decreased strength and balance with performing prolonged walking as noted by increased postural sway with performing walking over hurdles. COnitnued to assess SPC usage, good tehcnique noted, however contineus to be less functional than walker. Patient will benefit from further skilled therapy focused one improving limitations to return to prior level of function.    Personal Factors and Comorbidities Age;Behavior Pattern;Fitness;Transportation;Time since onset of injury/illness/exacerbation    Examination-Activity Limitations Toileting;Dressing;Sit;Transfers;Bed Mobility;Lift;Bend;Squat;Locomotion Level;Stairs;Carry;Stand;Reach Overhead    Examination-Participation Restrictions Yard Work;Community Activity;Shop;Driving    Stability/Clinical Decision Making Stable/Uncomplicated    Rehab Potential Good    PT Frequency 2x / week    PT Duration 8 weeks    PT Treatment/Interventions ADLs/Self Care Home Management;Cryotherapy;Electrical Stimulation;Moist Heat;DME Instruction;Gait training;Stair training;Functional mobility training;Therapeutic activities;Therapeutic exercise;Balance training;Neuromuscular re-education;Patient/family education;Manual techniques;Passive range of motion;Dry needling;Taping;Energy conservation;Ultrasound    PT Next Visit Plan Exercises to increase R strength, Gait with SPC    PT Home Exercise Plan STS and walking with AD    Consulted and Agree with Plan of Care Patient           Patient will benefit from skilled therapeutic intervention in order to improve  the following deficits and impairments:  Decreased endurance, Decreased mobility, Abnormal gait, Difficulty walking, Decreased range of motion, Obesity, Decreased activity tolerance, Decreased knowledge of use of DME, Decreased strength, Postural dysfunction, Pain, Improper body mechanics, Hypomobility, Decreased balance, Impaired perceived functional ability, Decreased safety awareness  Visit Diagnosis: Muscle weakness (generalized)  Difficulty in walking, not elsewhere classified     Problem List Patient Active Problem List   Diagnosis Date Noted  . Protrusion of thoracic intervertebral disc 08/02/2020  . CVA (cerebral vascular accident) (  HCC) 07/31/2020  . Urinary frequency 05/21/2020  . Unilateral primary osteoarthritis, left knee 03/17/2020  . Left knee pain 12/11/2019  . Swelling of lower leg 12/04/2019  . Pain and swelling of lower leg, left 12/04/2019  . Insomnia 08/28/2019  . 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 HTN (hypertension) 10/22/2015  . Type 1 diabetes (HCC) 10/22/2015  . Anemia 10/22/2015  . Restless legs 10/22/2015  . Hematuria 04/09/2015    Myrene Galas, PT DPT 10/07/2020, 4:55 PM  [place] [facility] PHYSICAL AND SPORTS MEDICINE [address] [place], [address] Phone: [phone]   Fax:  [phone]  Name: Courtney Stafford MRN: [id number] Date of Birth: 04-04-1971

## 2020-10-14 ENCOUNTER — Ambulatory Visit: Payer: Self-pay

## 2020-10-14 ENCOUNTER — Other Ambulatory Visit: Payer: Self-pay

## 2020-10-14 DIAGNOSIS — R262 Difficulty in walking, not elsewhere classified: Secondary | ICD-10-CM

## 2020-10-14 DIAGNOSIS — M6281 Muscle weakness (generalized): Secondary | ICD-10-CM

## 2020-10-15 ENCOUNTER — Other Ambulatory Visit: Payer: Self-pay

## 2020-10-15 DIAGNOSIS — E109 Type 1 diabetes mellitus without complications: Secondary | ICD-10-CM

## 2020-10-15 NOTE — Therapy (Signed)
[facility]TS [facility] [address], [place], [address] Phone: [phone]   Fax:  [phone]  Physical Therapy Treatment  Patient Details  Name: Courtney Stafford MRN: [id number] Date of Birth: [birth year] Referring Provider (PT): Lewie Chamber, MD   Encounter Date: 10/14/2020   PT End of Session - 10/14/20 1149    Visit Number 10    Number of Visits 17    Date for PT Re-Evaluation 10/07/20    PT Start Time 1118    PT Stop Time 1159    PT Time Calculation (min) 41 min    Equipment Utilized During Treatment Gait belt    Activity Tolerance Patient tolerated treatment well;No increased pain    Behavior During Therapy WFL for tasks assessed/performed           Past Medical History:  Diagnosis Date  . Diabetes mellitus without complication (HCC)   . Hypertension     Past Surgical History:  Procedure Laterality Date  . CARPAL TUNNEL RELEASE  6 years ago   both hands    There were no vitals filed for this visit.   Subjective Assessment - 10/14/20 1147    Subjective Patient reports she feels she is improving, however, states she still continues to have difficulty with walking, most notably without an AD.    Pertinent History Recent CVA 07/30/20.  Left knee pain. Pt was seen by Allie Bossier in May 2021, who diagnosed knee OA questionable need for TKA in future, has recommended conservative management at this point. Pt had a cortizone injection in May (without avail) and is shceduled for a 'gel' injection on 6/28.    Limitations Walking;Lifting;Standing;House hold activities    How long can you sit comfortably? unlimited    How long can you stand comfortably? ~5 minutes    How long can you walk comfortably? ~2-3 minutes    Diagnostic tests Diagnosed CVA    Patient Stated Goals To be able to walk without the use of an assitive device.    Currently in Pain? No/denies    Pain Onset More than a month ago            TREATMENT Therapeutic Exercise Hip extension in standing with YTB - x 20  Hip abduction in standing with YTB - x 20 Side stepping with holding 3kg ball with performing shoulder flexion - 3 x 45ft performed B Walking for speed - 3 x 28m Sit to stands with overhead 3kg ball lifts in standing - x 10  Holding on jumping with UE support - 2 x 10 NuStep in sitting level 5 for 6 minutes; level 7 for 1 minute -  to improve LE strength.   Performed exercises to address strength limitations.     PT Education - 10/14/20 1149    Education Details form/technique with exercise    Person(s) Educated Patient    Methods Explanation;Demonstration    Comprehension Returned demonstration            PT Short Term Goals - 08/12/20 1144      PT SHORT TERM GOAL #1   Title Patient will demonstrate independence with HEP to maximize rehab potential.    Time 4    Period Weeks    Status New    Target Date 09/09/20             PT Long Term Goals - 10/14/20 1136      PT LONG TERM GOAL #1  Title Patient will demonstrate independence with progressive HEP to maintain and progress improvements achieved during therapy.    Baseline moderate cueing with exercise;    Time 8    Period Weeks    Status On-going      PT LONG TERM GOAL #2   Title Patient will have a FOTO score of >68/100 to show an increase in independence while performing functional daily activities.    Baseline 08/12/20: 49; 10/14/2020:    Time 8    Period Weeks    Status On-going      PT LONG TERM GOAL #3   Title Patient will demonstrate improved facility and steadiness in ambulation while performing at a gait speed of >0.85m/s.    Baseline 08/12/20: 0.37 m/s; 10/14/2020: .75 m/s    Time 8    Period Weeks    Status On-going      PT LONG TERM GOAL #4   Title Patient will improve 5xSTS and TUG score to under 12 sec to show an increase in patient strength/power to decrease patients fall risk.    Baseline 08/12/20: 5xSTS:  21.5sec, TUG: 25.7sec; 10/14/20: 5xSTS: 12.6sec, TUG: 15.7sec    Time 8    Period Weeks    Status On-going      PT LONG TERM GOAL #5   Title Patient will be able to reach up to grab items out of her cabinets with R UE at home to show an improvement in her strength and ROM to perform task independently.    Baseline 90 Degrees of Flexion due to weakness/unable to reach up to cabinet    Time 8    Period Weeks    Status New                 Plan - 10/15/20 1013    Clinical Impression Statement Patient is making progress towards long term goals with improvement in TUG, , 5xSTS, and FOTO score; indicating significant improvement in LE strength, balance, and a decrease in fall risk. Although patient is improving she continues have decreased LE functional strength and functional measures below age normative data. This indicates increased fall risk and decreased LE strength. Patient will benefit from further skilled therapy to return to prior level of function.    Personal Factors and Comorbidities Age;Behavior Pattern;Fitness;Transportation;Time since onset of injury/illness/exacerbation    Examination-Activity Limitations Toileting;Dressing;Sit;Transfers;Bed Mobility;Lift;Bend;Squat;Locomotion Level;Stairs;Carry;Stand;Reach Overhead    Examination-Participation Restrictions Yard Work;Community Activity;Shop;Driving    Stability/Clinical Decision Making Stable/Uncomplicated    Rehab Potential Good    PT Frequency 2x / week    PT Duration 8 weeks    PT Treatment/Interventions ADLs/Self Care Home Management;Cryotherapy;Electrical Stimulation;Moist Heat;DME Instruction;Gait training;Stair training;Functional mobility training;Therapeutic activities;Therapeutic exercise;Balance training;Neuromuscular re-education;Patient/family education;Manual techniques;Passive range of motion;Dry needling;Taping;Energy conservation;Ultrasound    PT Next Visit Plan Exercises to increase R strength, Gait with  SPC    PT Home Exercise Plan STS and walking with AD    Consulted and Agree with Plan of Care Patient           Patient will benefit from skilled therapeutic intervention in order to improve the following deficits and impairments:  Decreased endurance, Decreased mobility, Abnormal gait, Difficulty walking, Decreased range of motion, Obesity, Decreased activity tolerance, Decreased knowledge of use of DME, Decreased strength, Postural dysfunction, Pain, Improper body mechanics, Hypomobility, Decreased balance, Impaired perceived functional ability, Decreased safety awareness  Visit Diagnosis: Muscle weakness (generalized)  Difficulty in walking, not elsewhere classified     Problem List Patient Active  Problem List   Diagnosis Date Noted  . Protrusion of thoracic intervertebral disc 08/02/2020  . CVA (cerebral vascular accident) (HCC) 07/31/2020  . Urinary frequency 05/21/2020  . Unilateral primary osteoarthritis, left knee 03/17/2020  . Left knee pain 12/11/2019  . Swelling of lower leg 12/04/2019  . Pain and swelling of lower leg, left 12/04/2019  . Insomnia 08/28/2019  . 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 HTN (hypertension) 10/22/2015  . Type 1 diabetes (HCC) 10/22/2015  . Anemia 10/22/2015  . Restless legs 10/22/2015  . Hematuria 04/09/2015    Myrene Galas, PT DPT 10/15/2020, 10:16 AM  [facility] PHYSICAL AND SPORTS MEDICINE [address], [place], [address] Phone: [phone]   Fax:  [phone]  Name: Courtney Stafford MRN: [id number] Date of Birth: [birth year]

## 2020-10-16 LAB — HEMOGLOBIN A1C
Est. average glucose Bld gHb Est-mCnc: 194 mg/dL
Hgb A1c MFr Bld: 8.4 % — ABNORMAL HIGH (ref 4.8–5.6)

## 2020-10-21 ENCOUNTER — Other Ambulatory Visit: Payer: Self-pay | Admitting: Gerontology

## 2020-10-21 ENCOUNTER — Other Ambulatory Visit: Payer: Self-pay

## 2020-10-21 ENCOUNTER — Ambulatory Visit: Payer: No Typology Code available for payment source

## 2020-10-21 ENCOUNTER — Ambulatory Visit: Payer: Self-pay | Admitting: Licensed Clinical Social Worker

## 2020-10-21 DIAGNOSIS — F331 Major depressive disorder, recurrent, moderate: Secondary | ICD-10-CM

## 2020-10-21 DIAGNOSIS — F411 Generalized anxiety disorder: Secondary | ICD-10-CM

## 2020-10-21 MED ORDER — MIRTAZAPINE 30 MG PO TABS: 30.0000 mg | ORAL_TABLET | Freq: Every day | ORAL | 2 refills | Status: DC

## 2020-10-21 MED ORDER — DULOXETINE HCL 60 MG PO CPEP: 60.0000 mg | ORAL_CAPSULE | Freq: Every morning | ORAL | 2 refills | Status: DC

## 2020-10-21 NOTE — BH Specialist Note (Signed)
Integrated Behavioral Health Follow Up In-Person Visit  MRN: [id number] Name: Courtney Stafford  Total time: 60 minutes  Types of Service: General Behavioral Integrated Care (BHI)  Interpretor:No. Interpretor Name and Language: none  Subjective: Courtney Stafford is a 49 y.o. female accompanied by herself Patient was referred by Hurman Horn, NP for mental health. Patient reports the following symptoms/concerns:  The patient reports she has been sick with a stomach virus and was unable to go to her physical therapy appointment today.She notes that she is doing her exercises that her therapist taught her and feels that she is getting stronger. She states her hand still feels a little weak but can hold things longer, and she could walk further during her last session. She states that she has an appointment at the [facility] to see Dr. Lanora Manis and is concerned because nobody from [facility] transportation services has called her with her pick up time yet. The patient reports that she hopes COVID goes away and  she can spend more time with her daughter and grandchildren. She noted that she enjoyed spending time with them over Thanksgiving. She stated she might get to see them for Christmas but is not sure yet. She notes that she has a friend who use to live near her but moved has been calling her once or twice a week to check in and chat. The patient requested that her medication refills be sent to [facility] so she could pick them up on Friday when she had a ride.  The patient discussed other transportation and health stressors.The patient denied suicidal or homicidal thoughts.  Duration of problem: ; Severity of problem: moderate  Objective: Mood: Euthymic and Affect: Appropriate Risk of harm to self or others: No plan to harm self or others  Life Context: Family and Social: see above School/Work: see above Self-Care: see above Life Changes: see above  Patient and/or  Family's Strengths/Protective Factors: Concrete supports in place (healthy food, safe environments, etc.) and Sense of purpose  Goals Addressed: Patient will: 1.  Reduce symptoms of: anxiety, depression and stress  2.  Increase knowledge and/or ability of: coping skills, healthy habits and self-management skills  3.  Demonstrate ability to: Increase healthy adjustment to current life circumstances  Progress towards Goals: Ongoing  Interventions: Interventions utilized:  Supportive Counseling was utilized by the clinician during today's follow up session. Clinician processed with the patient regarding how she has been doing since the last follow up session. Clinician measured the patient's anxiety and depression on a numerical scale. Clinician offered praise to the patient for growing her social support system and for doing well in physical therapy. The clinician offered to call transportation services on the patient's behalf. The clinician offered to request that her medication refills be made available through [facility] on Friday so she could pick them up.Clinician encouraged the patient to focus on the positives rather than the negatives in her life and to continue to utilize coping skills to address her current life circumstances.  Standardized Assessments completed: GAD-7 and PHQ 9  GAD-7    8 PHQ-9   14    Patient currently experiencing see above   Patient may benefit from see above  Plan: 1. Follow up with behavioral health clinician on : 11/20/2019 at 10:00 AM  2. Behavioral recommendations:  3. Referral(s): [facility] [employer] (In Clinic) 4. "From scale of 1-10, how likely are you to follow plan?":   Judith Part, Student-Social  Work

## 2020-10-22 ENCOUNTER — Ambulatory Visit: Payer: Self-pay | Admitting: Gerontology

## 2020-10-22 ENCOUNTER — Other Ambulatory Visit: Payer: Self-pay

## 2020-10-22 VITALS — BP 145/79 | HR 80 | Temp 97.5°F | Resp 16 | Wt 201.0 lb

## 2020-10-22 DIAGNOSIS — I639 Cerebral infarction, unspecified: Secondary | ICD-10-CM

## 2020-10-22 DIAGNOSIS — E109 Type 1 diabetes mellitus without complications: Secondary | ICD-10-CM

## 2020-10-22 DIAGNOSIS — I10 Essential (primary) hypertension: Secondary | ICD-10-CM

## 2020-10-22 MED ORDER — CARVEDILOL 12.5 MG PO TABS: 12.5000 mg | ORAL_TABLET | Freq: Two times a day (BID) | ORAL | 3 refills | Status: DC

## 2020-10-22 MED ORDER — AMLODIPINE BESYLATE 10 MG PO TABS: 10.0000 mg | ORAL_TABLET | Freq: Every day | ORAL | 0 refills | Status: DC

## 2020-10-22 MED ORDER — HYDRALAZINE HCL 25 MG PO TABS: 25.0000 mg | ORAL_TABLET | Freq: Every day | ORAL | 0 refills | Status: DC

## 2020-10-22 MED ORDER — LISINOPRIL 40 MG PO TABS: 40.0000 mg | ORAL_TABLET | Freq: Every day | ORAL | 0 refills | Status: DC

## 2020-10-22 MED ORDER — INSULIN GLARGINE 100 UNIT/ML SOLOSTAR PEN: 30.0000 [IU] | PEN_INJECTOR | Freq: Every day | SUBCUTANEOUS | 4 refills | Status: DC

## 2020-10-22 MED ORDER — NOVOLOG FLEXPEN 100 UNIT/ML ~~LOC~~ SOPN: PEN_INJECTOR | SUBCUTANEOUS | 3 refills | Status: DC

## 2020-10-22 NOTE — Progress Notes (Signed)
Established Patient Office Visit  Subjective:  Patient ID: Courtney Stafford, female    DOB: 09/14/1971  Age: 49 y.o. MRN: [id number]  CC: No chief complaint on file.   HPI Courtney Garibaldingela M Schneck presents for follow up of type 1 diabetes, hypertension, lab review and medication refill. Her HgbA1c done on 10/15/2020 increased from 7.2% to 8.4%, and she's compliant with medication. She checks her blood glucose three times daily, fasting this morning was 81 mg/dl. She brought her log, her fasting  readings ranges from 81-175 mg/dl, , evening readings [id number] mg/dl, dinner readings from 657167- 261 mg/dl. She denies hypoglycemic symptoms, but admits to polyuria. She reports that her peripheral neuropathy is controlled with taking gabapentin and Cymbalta.als She also checks her blood pressure at home and SBP ranges between129-173 and DBP 103- 118. She denies chest pain, palpitation, light headedness and vision changes. She was discharged from the hospital on 08/02/2020 after been treated for CVA and she states that the weakness to her right hand has improved 60%. She ambulates with a walker. MRI done during hospitalization showed 1. The patient would allow completion of only the sagittal T2 imaging. 2. No cord compression or primary cord lesion. 3. 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 4. Bilateral posterolateral disc protrusions at T1-2 with bilateral foraminal encroachment that could affect either T1 nerve. Per her hospital discharge, Neurology referral was recommended. Overall, she states that she's doing well and offers no further complaint.  Past Medical History:  Diagnosis Date  . Diabetes mellitus without complication (HCC)   . Hypertension     Past Surgical History:  Procedure Laterality Date  . CARPAL TUNNEL RELEASE  6 years ago   both hands    Family History  Problem Relation Age of Onset  . Cancer Father   . Breast cancer Neg Hx      Social History   Socioeconomic History  . Marital status: Single    Spouse name: Not on file  . Number of children: 2  . Years of education: Not on file  . Highest education level: High school graduate  Occupational History  . Occupation: unemployed  Tobacco Use  . Smoking status: Current Some Day Smoker    Packs/day: 0.01    Types: Cigarettes  . Smokeless tobacco: Former NeurosurgeonUser  . Tobacco comment: cutting back  Vaping Use  . Vaping Use: Never used  Substance and Sexual Activity  . Alcohol use: No  . Drug use: No  . Sexual activity: Not on file  Other Topics Concern  . Not on file  Social History Narrative   Completed biopsychosocial assessment, social determinants screening, administered PHQ 9, and GAD 7.       Social determinants screening completed 01/31/2020. HS She receives bus passes [facility] for transportation. She receives food stamps 192 per month and utilizes a Programme researcher, broadcasting/film/videofood pantry when needed. [employer] pays for housing and utilities.  She follows up with therapist at clinic for stress, anxiety, and depression. HS      Support system is small.   Social Determinants of Health   Financial Resource Strain: Low Risk   . Difficulty of Paying Living Expenses: Not very hard  Food Insecurity: No Food Insecurity  . Worried About Programme researcher, broadcasting/film/videounning Out of Food in the Last Year: Never true  . Ran Out of Food in the Last Year: Never true  Transportation Needs: Unmet Transportation Needs  . Lack of Transportation (Medical): Yes  .  Lack of Transportation (Non-Medical): No  Physical Activity: Insufficiently Active  . Days of Exercise per Week: 3 days  . Minutes of Exercise per Session: 40 min  Stress: Stress Concern Present  . Feeling of Stress : Rather much  Social Connections: Socially Isolated  . Frequency of Communication with Friends and Family: Never  . Frequency of Social Gatherings with Friends and Family: Never  . Attends Religious Services: Never  .  Active Member of Clubs or Organizations: No  . Attends Banker Meetings: Never  . Marital Status: Never married  Intimate Partner Violence: Not At Risk  . Fear of Current or Ex-Partner: No  . Emotionally Abused: No  . Physically Abused: No  . Sexually Abused: No    Outpatient Medications Prior to Visit  Medication Sig Dispense Refill  . aspirin EC 81 MG EC tablet Take 1 tablet (81 mg total) by mouth daily. Swallow whole. 30 tablet 11  . atorvastatin (LIPITOR) 80 MG tablet Take 1 tablet (80 mg total) by mouth daily. 90 tablet 3  . DULoxetine (CYMBALTA) 60 MG capsule Take 1 capsule (60 mg total) by mouth every morning. 30 capsule 2  . gabapentin (NEURONTIN) 300 MG capsule TAKE 2 CAPSULES (600MG ) BY MOUTH 2 TIMES A DAY AND 3 CAPSULES (900MG ) AT NIGHT 210 capsule 0  . hydrochlorothiazide (HYDRODIURIL) 25 MG tablet Take 1 tablet (25 mg total) by mouth daily. 90 tablet 1  . Insulin Pen Needle (BD PEN NEEDLE NANO U/F) 32G X 4 MM MISC 5 times daily 200 each 12  . meloxicam (MOBIC) 7.5 MG tablet Take 1 tablet (7.5 mg total) by mouth daily as needed for pain. 30 tablet 0  . mirtazapine (REMERON) 30 MG tablet Take 1 tablet (30 mg total) by mouth at bedtime. 30 tablet 2  . amLODipine (NORVASC) 10 MG tablet Take 1 tablet (10 mg total) by mouth daily. 90 tablet 0  . carvedilol (COREG) 12.5 MG tablet Take 1 tablet (12.5 mg total) by mouth 2 (two) times daily with a meal. 60 tablet 3  . insulin aspart (NOVOLOG FLEXPEN) 100 UNIT/ML FlexPen She states taking 5-7 units with meals and adjust with sliding scale as prescribed by Endocrinology.. 75 mL 3  . insulin glargine (LANTUS) 100 UNIT/ML Solostar Pen Inject 30 Units into the skin at bedtime. 30 mL 4  . lisinopril (ZESTRIL) 40 MG tablet Take 1 tablet (40 mg total) by mouth daily. 90 tablet 0  . Blood Glucose Monitoring Suppl Supplies MISC 1 each by Does not apply route 5 (five) times daily. 500 each 4   No facility-administered medications  prior to visit.    Allergies  Allergen Reactions  . Phenergan [Promethazine] Hives    ROS Review of Systems  Constitutional: Negative.   HENT: Negative.   Eyes: Negative.   Respiratory: Negative.   Endocrine: Positive for polyuria.  Neurological: Positive for weakness (mild to right arm due to stroke).  Psychiatric/Behavioral: Negative.       Objective:    Physical Exam HENT:     Head: Normocephalic and atraumatic.  Eyes:     Extraocular Movements: Extraocular movements intact.     Conjunctiva/sclera: Conjunctivae normal.     Pupils: Pupils are equal, round, and reactive to light.  Cardiovascular:     Rate and Rhythm: Normal rate and regular rhythm.     Pulses: Normal pulses.     Heart sounds: Normal heart sounds.  Pulmonary:     Effort: Pulmonary effort is normal.  Breath sounds: Normal breath sounds.  Skin:    General: Skin is warm.  Neurological:     Mental Status: She is alert.     Motor: Weakness (mild weakness to right arm) present.  Psychiatric:        Mood and Affect: Mood normal.        Behavior: Behavior normal.        Thought Content: Thought content normal.        Judgment: Judgment normal.     BP (!) 145/79 (BP Location: Right Arm, Patient Position: Sitting, Cuff Size: Large)   Pulse 80   Temp (!) 97.5 F (36.4 C)   Resp 16   Wt 201 lb (91.2 kg)   LMP 01/03/2016 (Within Years)   SpO2 97%   BMI 39.26 kg/m  Wt Readings from Last 3 Encounters:  10/22/20 201 lb (91.2 kg)  07/31/20 200 lb (90.7 kg)  07/23/20 208 lb (94.3 kg)   She was encouraged to loose weight.  Health Maintenance Due  Topic Date Due  . Hepatitis C Screening  Never done  . COVID-19 Vaccine (1) Never done  . HIV Screening  Never done  . TETANUS/TDAP  Never done  . OPHTHALMOLOGY EXAM  03/23/2019  . COLONOSCOPY  Never done  . INFLUENZA VACCINE  06/15/2020    There are no preventive care reminders to display for this patient.  Lab Results  Component Value Date    TSH 0.951 07/31/2020   Lab Results  Component Value Date   WBC 9.2 08/02/2020   HGB 16.0 (H) 08/02/2020   HCT 48.5 (H) 08/02/2020   MCV 83.9 08/02/2020   PLT 328 08/02/2020   Lab Results  Component Value Date   NA 140 08/02/2020   K 3.4 (L) 08/02/2020   CO2 25 08/02/2020   GLUCOSE 46 (L) 08/02/2020   BUN 13 08/02/2020   CREATININE 0.54 08/02/2020   BILITOT 0.6 07/31/2020   ALKPHOS 99 07/31/2020   AST 18 07/31/2020   ALT 18 07/31/2020   PROT 7.9 07/31/2020   ALBUMIN 4.0 07/31/2020   CALCIUM 9.1 08/02/2020   ANIONGAP 11 08/02/2020   Lab Results  Component Value Date   CHOL 218 (H) 07/31/2020   Lab Results  Component Value Date   HDL 78 07/31/2020   Lab Results  Component Value Date   LDLCALC 109 (H) 07/31/2020   Lab Results  Component Value Date   TRIG 157 (H) 07/31/2020   Lab Results  Component Value Date   CHOLHDL 2.8 07/31/2020   Lab Results  Component Value Date   HGBA1C 8.4 (H) 10/15/2020      Assessment & Plan:    1. Essential hypertension - Her blood pressure is not controlled, she will start 25 mg Hydralazine, was educated on medication side effects and advised to notify clinic. She will also continue with the rest of her blood pressure medications, DASH diet. - amLODipine (NORVASC) 10 MG tablet; Take 1 tablet (10 mg total) by mouth daily.  Dispense: 90 tablet; Refill: 0 - carvedilol (COREG) 12.5 MG tablet; Take 1 tablet (12.5 mg total) by mouth 2 (two) times daily with a meal.  Dispense: 60 tablet; Refill: 3 - lisinopril (ZESTRIL) 40 MG tablet; Take 1 tablet (40 mg total) by mouth daily.  Dispense: 90 tablet; Refill: 0 - hydrALAZINE (APRESOLINE) 25 MG tablet; Take 1 tablet (25 mg total) by mouth daily.  Dispense: 40 tablet; Refill: 0  2. Type 1 diabetes mellitus without complication (HCC) -  Her HgbA1c was 8.4%, her goal should be less than 6%, she will continue on current treatment regimen, low carb/non concentrated sweet diet. She will continue  to check her blood pressure tid, record and bring log to follow up appointment. - insulin glargine (LANTUS) 100 UNIT/ML Solostar Pen; Inject 30 Units into the skin at bedtime.  Dispense: 30 mL; Refill: 4 - insulin aspart (NOVOLOG FLEXPEN) 100 UNIT/ML FlexPen; She states taking 5-7 units with meals and adjust with sliding scale as prescribed by Endocrinology..  Dispense: 75 mL; Refill: 3 - Ambulatory referral to Ophthalmology   3. Cerebrovascular accident (CVA), unspecified mechanism (HCC) - She will continue to follow up with Physical Therapy and Neurology referral. She was advised to go to the ED for worsening symptoms. - Ambulatory referral to Neurology    Follow-up: Return in about 5 weeks (around 11/26/2020), or if symptoms worsen or fail to improve.    Bowie Delia Trellis Paganini, NP

## 2020-10-22 NOTE — Patient Instructions (Signed)
Carbohydrate Counting for Diabetes Mellitus, Adult  Carbohydrate counting is a method of keeping track of how many carbohydrates you eat. Eating carbohydrates naturally increases the amount of sugar (glucose) in the blood. Counting how many carbohydrates you eat helps keep your blood glucose within normal limits, which helps you manage your diabetes (diabetes mellitus). It is important to know how many carbohydrates you can safely have in each meal. This is different for every person. A diet and nutrition specialist (registered dietitian) can help you make a meal plan and calculate how many carbohydrates you should have at each meal and snack. Carbohydrates are found in the following foods:  Grains, such as breads and cereals.  Dried beans and soy products.  Starchy vegetables, such as potatoes, peas, and corn.  Fruit and fruit juices.  Milk and yogurt.  Sweets and snack foods, such as cake, cookies, candy, chips, and soft drinks. How do I count carbohydrates? There are two ways to count carbohydrates in food. You can use either of the methods or a combination of both. Reading "Nutrition Facts" on packaged food The "Nutrition Facts" list is included on the labels of almost all packaged foods and beverages in the [place] It includes:  The serving size.  Information about nutrients in each serving, including the grams (g) of carbohydrate per serving. To use the "Nutrition Facts":  Decide how many servings you will have.  Multiply the number of servings by the number of carbohydrates per serving.  The resulting number is the total amount of carbohydrates that you will be having. Learning standard serving sizes of other foods When you eat carbohydrate foods that are not packaged or do not include "Nutrition Facts" on the label, you need to measure the servings in order to count the amount of carbohydrates:  Measure the foods that you will eat with a food scale or measuring cup, if needed.   Decide how many standard-size servings you will eat.  Multiply the number of servings by 15. Most carbohydrate-rich foods have about 15 g of carbohydrates per serving. ? For example, if you eat 8 oz (170 g) of strawberries, you will have eaten 2 servings and 30 g of carbohydrates (2 servings x 15 g = 30 g).  For foods that have more than one food mixed, such as soups and casseroles, you must count the carbohydrates in each food that is included. The following list contains standard serving sizes of common carbohydrate-rich foods. Each of these servings has about 15 g of carbohydrates:   hamburger bun or  English muffin.   oz (15 mL) syrup.   oz (14 g) jelly.  1 slice of bread.  1 six-inch tortilla.  3 oz (85 g) cooked rice or pasta.  4 oz (113 g) cooked dried beans.  4 oz (113 g) starchy vegetable, such as peas, corn, or potatoes.  4 oz (113 g) hot cereal.  4 oz (113 g) mashed potatoes or  of a large baked potato.  4 oz (113 g) canned or frozen fruit.  4 oz (120 mL) fruit juice.  4-6 crackers.  6 chicken nuggets.  6 oz (170 g) unsweetened dry cereal.  6 oz (170 g) plain fat-free yogurt or yogurt sweetened with artificial sweeteners.  8 oz (240 mL) milk.  8 oz (170 g) fresh fruit or one small piece of fruit.  24 oz (680 g) popped popcorn. Example of carbohydrate counting Sample meal  3 oz (85 g) chicken breast.  6 oz (170 g)   brown rice.  4 oz (113 g) corn.  8 oz (240 mL) milk.  8 oz (170 g) strawberries with sugar-free whipped topping. Carbohydrate calculation 1. Identify the foods that contain carbohydrates: ? Rice. ? Corn. ? Milk. ? Strawberries. 2. Calculate how many servings you have of each food: ? 2 servings rice. ? 1 serving corn. ? 1 serving milk. ? 1 serving strawberries. 3. Multiply each number of servings by 15 g: ? 2 servings rice x 15 g = 30 g. ? 1 serving corn x 15 g = 15 g. ? 1 serving milk x 15 g = 15 g. ? 1 serving  strawberries x 15 g = 15 g. 4. Add together all of the amounts to find the total grams of carbohydrates eaten: ? 30 g + 15 g + 15 g + 15 g = 75 g of carbohydrates total. Summary  Carbohydrate counting is a method of keeping track of how many carbohydrates you eat.  Eating carbohydrates naturally increases the amount of sugar (glucose) in the blood.  Counting how many carbohydrates you eat helps keep your blood glucose within normal limits, which helps you manage your diabetes.  A diet and nutrition specialist (registered dietitian) can help you make a meal plan and calculate how many carbohydrates you should have at each meal and snack. This information is not intended to replace advice given to you by your health care provider. Make sure you discuss any questions you have with your health care provider. Document Revised: 05/26/2017 Document Reviewed: 04/14/2016 Elsevier Patient Education  2020 [employer] DASH Eating Plan DASH stands for "Dietary Approaches to Stop Hypertension." The DASH eating plan is a healthy eating plan that has been shown to reduce high blood pressure (hypertension). It may also reduce your risk for type 2 diabetes, heart disease, and stroke. The DASH eating plan may also help with weight loss. What are tips for following this plan?  General guidelines  Avoid eating more than 2,300 mg (milligrams) of salt (sodium) a day. If you have hypertension, you may need to reduce your sodium intake to 1,500 mg a day.  Limit alcohol intake to no more than 1 drink a day for nonpregnant women and 2 drinks a day for men. One drink equals 12 oz of beer, 5 oz of wine, or 1 oz of hard liquor.  Work with your health care provider to maintain a healthy body weight or to lose weight. Ask what an ideal weight is for you.  Get at least 30 minutes of exercise that causes your heart to beat faster (aerobic exercise) most days of the week. Activities may include walking, swimming, or  biking.  Work with your health care provider or diet and nutrition specialist (dietitian) to adjust your eating plan to your individual calorie needs. Reading food labels   Check food labels for the amount of sodium per serving. Choose foods with less than 5 percent of the Daily Value of sodium. Generally, foods with less than 300 mg of sodium per serving fit into this eating plan.  To find whole grains, look for the word "whole" as the first word in the ingredient list. Shopping  Buy products labeled as "low-sodium" or "no salt added."  Buy fresh foods. Avoid canned foods and premade or frozen meals. Cooking  Avoid adding salt when cooking. Use salt-free seasonings or herbs instead of table salt or sea salt. Check with your health care provider or pharmacist before using salt substitutes.  Do not   fry foods. Cook foods using healthy methods such as baking, boiling, grilling, and broiling instead.  Cook with heart-healthy oils, such as olive, canola, soybean, or sunflower oil. Meal planning  Eat a balanced diet that includes: ? 5 or more servings of fruits and vegetables each day. At each meal, try to fill half of your plate with fruits and vegetables. ? Up to 6-8 servings of whole grains each day. ? Less than 6 oz of lean meat, poultry, or fish each day. A 3-oz serving of meat is about the same size as a deck of cards. One egg equals 1 oz. ? 2 servings of low-fat dairy each day. ? A serving of nuts, seeds, or beans 5 times each week. ? Heart-healthy fats. Healthy fats called Omega-3 fatty acids are found in foods such as flaxseeds and coldwater fish, like sardines, salmon, and mackerel.  Limit how much you eat of the following: ? Canned or prepackaged foods. ? Food that is high in trans fat, such as fried foods. ? Food that is high in saturated fat, such as fatty meat. ? Sweets, desserts, sugary drinks, and other foods with added sugar. ? Full-fat dairy products.  Do not salt  foods before eating.  Try to eat at least 2 vegetarian meals each week.  Eat more home-cooked food and less restaurant, buffet, and fast food.  When eating at a restaurant, ask that your food be prepared with less salt or no salt, if possible. What foods are recommended? The items listed may not be a complete list. Talk with your dietitian about what dietary choices are best for you. Grains Whole-grain or whole-wheat bread. Whole-grain or whole-wheat pasta. Brown rice. Oatmeal. Quinoa. Bulgur. Whole-grain and low-sodium cereals. Pita bread. Low-fat, low-sodium crackers. Whole-wheat flour tortillas. Vegetables Fresh or frozen vegetables (raw, steamed, roasted, or grilled). Low-sodium or reduced-sodium tomato and vegetable juice. Low-sodium or reduced-sodium tomato sauce and tomato paste. Low-sodium or reduced-sodium canned vegetables. Fruits All fresh, dried, or frozen fruit. Canned fruit in natural juice (without added sugar). Meat and other protein foods Skinless chicken or turkey. Ground chicken or turkey. Pork with fat trimmed off. Fish and seafood. Egg whites. Dried beans, peas, or lentils. Unsalted nuts, nut butters, and seeds. Unsalted canned beans. Lean cuts of beef with fat trimmed off. Low-sodium, lean deli meat. Dairy Low-fat (1%) or fat-free (skim) milk. Fat-free, low-fat, or reduced-fat cheeses. Nonfat, low-sodium ricotta or cottage cheese. Low-fat or nonfat yogurt. Low-fat, low-sodium cheese. Fats and oils Soft margarine without trans fats. Vegetable oil. Low-fat, reduced-fat, or light mayonnaise and salad dressings (reduced-sodium). Canola, safflower, olive, soybean, and sunflower oils. Avocado. Seasoning and other foods Herbs. Spices. Seasoning mixes without salt. Unsalted popcorn and pretzels. Fat-free sweets. What foods are not recommended? The items listed may not be a complete list. Talk with your dietitian about what dietary choices are best for you. Grains Baked goods  made with fat, such as croissants, muffins, or some breads. Dry pasta or rice meal packs. Vegetables Creamed or fried vegetables. Vegetables in a cheese sauce. Regular canned vegetables (not low-sodium or reduced-sodium). Regular canned tomato sauce and paste (not low-sodium or reduced-sodium). Regular tomato and vegetable juice (not low-sodium or reduced-sodium). Pickles. Olives. Fruits Canned fruit in a light or heavy syrup. Fried fruit. Fruit in cream or butter sauce. Meat and other protein foods Fatty cuts of meat. Ribs. Fried meat. Bacon. Sausage. Bologna and other processed lunch meats. Salami. Fatback. Hotdogs. Bratwurst. Salted nuts and seeds. Canned beans with added salt.   Canned or smoked fish. Whole eggs or egg yolks. Chicken or turkey with skin. Dairy Whole or 2% milk, cream, and half-and-half. Whole or full-fat cream cheese. Whole-fat or sweetened yogurt. Full-fat cheese. Nondairy creamers. Whipped toppings. Processed cheese and cheese spreads. Fats and oils Butter. Stick margarine. Lard. Shortening. Ghee. Bacon fat. Tropical oils, such as coconut, palm kernel, or palm oil. Seasoning and other foods Salted popcorn and pretzels. Onion salt, garlic salt, seasoned salt, table salt, and sea salt. Worcestershire sauce. Tartar sauce. Barbecue sauce. Teriyaki sauce. Soy sauce, including reduced-sodium. Steak sauce. Canned and packaged gravies. Fish sauce. Oyster sauce. Cocktail sauce. Horseradish that you find on the shelf. Ketchup. Mustard. Meat flavorings and tenderizers. Bouillon cubes. Hot sauce and Tabasco sauce. Premade or packaged marinades. Premade or packaged taco seasonings. Relishes. Regular salad dressings. Where to find more information:  National Heart, Lung, and Blood Institute: www.nhlbi.nih.gov  American Heart Association: www.heart.org Summary  The DASH eating plan is a healthy eating plan that has been shown to reduce high blood pressure (hypertension). It may also reduce  your risk for type 2 diabetes, heart disease, and stroke.  With the DASH eating plan, you should limit salt (sodium) intake to 2,300 mg a day. If you have hypertension, you may need to reduce your sodium intake to 1,500 mg a day.  When on the DASH eating plan, aim to eat more fresh fruits and vegetables, whole grains, lean proteins, low-fat dairy, and heart-healthy fats.  Work with your health care provider or diet and nutrition specialist (dietitian) to adjust your eating plan to your individual calorie needs. This information is not intended to replace advice given to you by your health care provider. Make sure you discuss any questions you have with your health care provider. Document Revised: 10/14/2017 Document Reviewed: 10/25/2016 Elsevier Patient Education  2020 Elsevier Inc.  

## 2020-10-23 ENCOUNTER — Ambulatory Visit: Payer: No Typology Code available for payment source | Attending: Gerontology

## 2020-10-23 DIAGNOSIS — G8929 Other chronic pain: Secondary | ICD-10-CM | POA: Insufficient documentation

## 2020-10-23 DIAGNOSIS — M25562 Pain in left knee: Secondary | ICD-10-CM | POA: Insufficient documentation

## 2020-10-23 DIAGNOSIS — R262 Difficulty in walking, not elsewhere classified: Secondary | ICD-10-CM | POA: Insufficient documentation

## 2020-10-23 DIAGNOSIS — M6281 Muscle weakness (generalized): Secondary | ICD-10-CM | POA: Insufficient documentation

## 2020-10-23 NOTE — Therapy (Signed)
[facility] [address] [place], [address] Phone: [phone]   Fax:  [phone]  Physical Therapy Treatment  Patient Details  Name: Courtney Stafford MRN: [id number] Date of Birth: [birth year] Referring Provider (PT): Lewie Chamber, MD   Encounter Date: 10/23/2020   PT End of Session - 10/23/20 1554    Visit Number 11    Number of Visits 17    Date for PT Re-Evaluation 11/25/20    PT Start Time 1545    PT Stop Time 1630    PT Time Calculation (min) 45 min    Equipment Utilized During Treatment Gait belt    Activity Tolerance Patient tolerated treatment well;No increased pain    Behavior During Therapy WFL for tasks assessed/performed           Past Medical History:  Diagnosis Date  . Diabetes mellitus without complication (HCC)   . Hypertension     Past Surgical History:  Procedure Laterality Date  . CARPAL TUNNEL RELEASE  6 years ago   both hands    There were no vitals filed for this visit.   Subjective Assessment - 10/23/20 1525    Subjective Patient states she has been performing exercises at home, but states she has had increased difficulty with walking for prolonged periods of time.    Pertinent History Recent CVA 07/30/20.  Left knee pain. Pt was seen by Allie Bossier in May 2021, who diagnosed knee OA questionable need for TKA in future, has recommended conservative management at this point. Pt had a cortizone injection in May (without avail) and is shceduled for a 'gel' injection on 6/28.    Limitations Walking;Lifting;Standing;House hold activities    How long can you sit comfortably? unlimited    How long can you stand comfortably? ~5 minutes    How long can you walk comfortably? ~2-3 minutes    Diagnostic tests Diagnosed CVA    Patient Stated Goals To be able to walk without the use of an assitive device.    Currently in Pain? No/denies    Pain Onset More than a month ago               TREATMENT Therapeutic Exercise NuStep in sitting level 5 for 4 minutes; level 7 for 1 minute -  to improve LE strength. Squats in standing with working to address hip hinging - 2 x 15 Lunges in standing - 2 x 10 B Walking for speed - 255ft Walking with head turns up/down ; left/right - 2 x 17ft in each direction  Side stepping with GTB around knees - 3 x 45ft B Hip extension in standing with YTB - x 15 Hip abduction in standing with YTB - 2 x 15 Holding on jumping with UE support - x 10    Performed exercises to address strength limitations.       PT Education - 10/23/20 1544    Education Details form/technique with exericise    Person(s) Educated Patient    Methods Explanation;Demonstration    Comprehension Verbalized understanding;Returned demonstration            PT Short Term Goals - 08/12/20 1144      PT SHORT TERM GOAL #1   Title Patient will demonstrate independence with HEP to maximize rehab potential.    Time 4    Period Weeks    Status New    Target Date 09/09/20  PT Long Term Goals - 10/14/20 1136      PT LONG TERM GOAL #1   Title Patient will demonstrate independence with progressive HEP to maintain and progress improvements achieved during therapy.    Baseline moderate cueing with exercise;    Time 8    Period Weeks    Status On-going      PT LONG TERM GOAL #2   Title Patient will have a FOTO score of >68/100 to show an increase in independence while performing functional daily activities.    Baseline 08/12/20: 49; 10/14/2020:    Time 8    Period Weeks    Status On-going      PT LONG TERM GOAL #3   Title Patient will demonstrate improved facility and steadiness in ambulation while performing at a gait speed of >0.40m/s.    Baseline 08/12/20: 0.37 m/s; 10/14/2020: .75 m/s    Time 8    Period Weeks    Status On-going      PT LONG TERM GOAL #4   Title Patient will improve 5xSTS and TUG score to under 12 sec to  show an increase in patient strength/power to decrease patients fall risk.    Baseline 08/12/20: 5xSTS: 21.5sec, TUG: 25.7sec; 10/14/20: 5xSTS: 12.6sec, TUG: 15.7sec    Time 8    Period Weeks    Status On-going      PT LONG TERM GOAL #5   Title Patient will be able to reach up to grab items out of her cabinets with R UE at home to show an improvement in her strength and ROM to perform task independently.    Baseline 90 Degrees of Flexion due to weakness/unable to reach up to cabinet    Time 8    Period Weeks    Status New                 Plan - 10/23/20 1613    Clinical Impression Statement Patient able to perform exercises with less assistance overall with walking, with ability to amb without AD 250 ft with CGA indicating improvement in dyanmic balance. Patient also able to amb with head turns without LOB indicating further improvement. Patient does continue to have balance limitations, but is able to tolerate greater challenge. Patient will benefit from further skilled therapy to decrease risk of falls.    Personal Factors and Comorbidities Age;Behavior Pattern;Fitness;Transportation;Time since onset of injury/illness/exacerbation    Examination-Activity Limitations Toileting;Dressing;Sit;Transfers;Bed Mobility;Lift;Bend;Squat;Locomotion Level;Stairs;Carry;Stand;Reach Overhead    Examination-Participation Restrictions Yard Work;Community Activity;Shop;Driving    Stability/Clinical Decision Making Stable/Uncomplicated    Rehab Potential Good    PT Frequency 2x / week    PT Duration 8 weeks    PT Treatment/Interventions ADLs/Self Care Home Management;Cryotherapy;Electrical Stimulation;Moist Heat;DME Instruction;Gait training;Stair training;Functional mobility training;Therapeutic activities;Therapeutic exercise;Balance training;Neuromuscular re-education;Patient/family education;Manual techniques;Passive range of motion;Dry needling;Taping;Energy conservation;Ultrasound    PT Next  Visit Plan Exercises to increase R strength, Gait with SPC    PT Home Exercise Plan STS and walking with AD    Consulted and Agree with Plan of Care Patient           Patient will benefit from skilled therapeutic intervention in order to improve the following deficits and impairments:  Decreased endurance,Decreased mobility,Abnormal gait,Difficulty walking,Decreased range of motion,Obesity,Decreased activity tolerance,Decreased knowledge of use of DME,Decreased strength,Postural dysfunction,Pain,Improper body mechanics,Hypomobility,Decreased balance,Impaired perceived functional ability,Decreased safety awareness  Visit Diagnosis: Muscle weakness (generalized)  Difficulty in walking, not elsewhere classified     Problem List Patient Active Problem List  Diagnosis Date Noted  . Protrusion of thoracic intervertebral disc 08/02/2020  . CVA (cerebral vascular accident) (HCC) 07/31/2020  . Urinary frequency 05/21/2020  . Unilateral primary osteoarthritis, left knee 03/17/2020  . Left knee pain 12/11/2019  . Swelling of lower leg 12/04/2019  . Pain and swelling of lower leg, left 12/04/2019  . Insomnia 08/28/2019  . 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 HTN (hypertension) 10/22/2015  . Type 1 diabetes (HCC) 10/22/2015  . Anemia 10/22/2015  . Restless legs 10/22/2015  . Hematuria 04/09/2015    Myrene Galas, PT DPT 10/23/2020, 4:25 PM  [facility] [address] [place], [address] Phone: [phone]   Fax:  [phone]  Name: Courtney Stafford MRN: [id number] Date of Birth: [birth year]

## 2020-10-23 NOTE — Addendum Note (Signed)
Addended by: Bethanie Dicker on: 10/23/2020 03:48 PM   Modules accepted: Orders

## 2020-10-27 ENCOUNTER — Telehealth: Payer: Self-pay | Admitting: Pharmacist

## 2020-10-27 NOTE — Telephone Encounter (Signed)
10/27/2020 8:32:34 AM - Novolog Flexpen & tips refill to dr  -- [name] - Monday, October 27, 2020 8:31 AM --Taking Novo Nordisk refill request to [facility] for provider to sign: Novolog Flexpen Inject 5-7 units with meals (max. daily 21 units) #2 boxes, Novofine 32G tips # 4 boxes.

## 2020-10-28 ENCOUNTER — Other Ambulatory Visit: Payer: Self-pay

## 2020-10-28 ENCOUNTER — Ambulatory Visit: Payer: No Typology Code available for payment source | Admitting: Pharmacist

## 2020-10-28 ENCOUNTER — Ambulatory Visit: Payer: No Typology Code available for payment source

## 2020-10-28 DIAGNOSIS — Z79899 Other long term (current) drug therapy: Secondary | ICD-10-CM

## 2020-10-28 NOTE — Progress Notes (Signed)
Medication Management Clinic Visit Note  Patient: Courtney Stafford MRN: [id number] Date of Birth: 10-Jan-1971 PCP: Rolm Gala, NP   Courtney Stafford 49 y.o. female with a PMH for T1DM, HTN, and hx CVA (07/2020) who presents for her annual MTM visit today. Confirmed patient ID via name and DOB.   LMP 01/03/2016 (Within Years)   Patient Information   Past Medical History:  Diagnosis Date  . Diabetes mellitus without complication (HCC)   . Hypertension   . Stroke [facility]) 07/31/2020      Past Surgical History:  Procedure Laterality Date  . CARPAL TUNNEL RELEASE  6 years ago   both hands     Family History  Problem Relation Age of Onset  . Cancer Father   . Diabetes Maternal Grandmother   . Breast cancer Neg Hx     New Diagnoses (since last visit): CVA (07/2020)  Family Support: None  Lifestyle Diet: Breakfast: eggs and bacon Lunch: sandwich, noodles Dinner: chicken and vegetables    Current Exercise Habits: The patient does not participate in regular exercise at present  Exercise limited by: orthopedic condition(s) (currently doing physical therapy; requires walker)    Social History   Substance and Sexual Activity  Alcohol Use No      Social History   Tobacco Use  Smoking Status Current Some Day Smoker  . Packs/day: 0.01  . Types: Cigarettes  Smokeless Tobacco Former Neurosurgeon  Tobacco Comment   occasional      Health Maintenance  Topic Date Due  . Hepatitis C Screening  Never done  . COVID-19 Vaccine (1) Never done  . HIV Screening  Never done  . TETANUS/TDAP  Never done  . OPHTHALMOLOGY EXAM  03/23/2019  . COLONOSCOPY  Never done  . INFLUENZA VACCINE  06/15/2020  . HEMOGLOBIN A1C  04/15/2021  . MAMMOGRAM  09/18/2021  . PAP SMEAR-Modifier  09/18/2022  . PNEUMOCOCCAL POLYSACCHARIDE VACCINE AGE 36-64 HIGH RISK  Completed  . FOOT EXAM  Discontinued   Outpatient Encounter Medications as of 10/28/2020  Medication Sig  . amLODipine (NORVASC)  10 MG tablet Take 1 tablet (10 mg total) by mouth daily.  Marland Kitchen aspirin EC 81 MG EC tablet Take 1 tablet (81 mg total) by mouth daily. Swallow whole.  Marland Kitchen atorvastatin (LIPITOR) 80 MG tablet Take 1 tablet (80 mg total) by mouth daily.  . Blood Glucose Monitoring Suppl Supplies MISC 1 each by Does not apply route 5 (five) times daily. (Patient taking differently: 1 each by Does not apply route. Check 3 to 4 times daily)  . carvedilol (COREG) 12.5 MG tablet Take 1 tablet (12.5 mg total) by mouth 2 (two) times daily with a meal.  . DULoxetine (CYMBALTA) 60 MG capsule Take 1 capsule (60 mg total) by mouth every morning.  . gabapentin (NEURONTIN) 300 MG capsule TAKE 2 CAPSULES (600MG ) BY MOUTH 2 TIMES A DAY AND 3 CAPSULES (900MG ) AT NIGHT  . hydrALAZINE (APRESOLINE) 25 MG tablet Take 1 tablet (25 mg total) by mouth daily.  . hydrochlorothiazide (HYDRODIURIL) 25 MG tablet Take 1 tablet (25 mg total) by mouth daily.  . insulin aspart (NOVOLOG FLEXPEN) 100 UNIT/ML FlexPen She states taking 5-7 units with meals and adjust with sliding scale as prescribed by Endocrinology.. (Patient taking differently: She states taking 5-9 units with meals and adjust with sliding scale as prescribed by Endocrinology. )  . insulin glargine (LANTUS) 100 UNIT/ML Solostar Pen Inject 30 Units into the skin at bedtime.  . Insulin Pen  Needle (BD PEN NEEDLE NANO U/F) 32G X 4 MM MISC 5 times daily (Patient taking differently: 3 to 4 times daily)  . lisinopril (ZESTRIL) 40 MG tablet Take 1 tablet (40 mg total) by mouth daily.  . meloxicam (MOBIC) 7.5 MG tablet Take 1 tablet (7.5 mg total) by mouth daily as needed for pain.  . mirtazapine (REMERON) 30 MG tablet Take 1 tablet (30 mg total) by mouth at bedtime.   No facility-administered encounter medications on file as of 10/28/2020.    Assessment and Plan:  Type 1 diabetes mellitus: Patient states she takes Lantus 30 units daily and Novolog 5-9 units for mealtime coverage. Patient states  she usually only needs 5 units of novolog for mealtime. Patient check her BG 3-4 times per day; her morning BG range is 76-152 and BG after meals ranges from 149-224. Her last A1c was 8.4% on 10/15/20. Since last MTM patient seems to have improved diet, including more vegetables. Outside of physical therapy, patient does not exercise due to issues with her left knee and s/p CVA requiring a walker. Patient has not had a recent influenza vaccination; received Pneumovax in 2020. Patient states she has received COVID vaccine (2 doses).  Hypertension: Patient reports checking her blood pressure in the morning at home. Readings are in the 140s/90s. Patient currently takes amlodipine, HCTZ, and lisinopril. Hydralazine 25mg  at bedtime was recently added (~1 week ago) and patient is scheduled for a follow up visit on 11/26/20 to assess blood pressure. Patient does report fatigue after taking hydralazine so she takes this in the evening.   Hx CVA: Patient had a recent stroke in September 2021. She completed 21 days of clopidogrel and is on aspirin 81mg  daily and atorvastatin 80mg . Patient states she is doing better and goes to physical therapy twice per week.   Restless Legs: Patient is on gabapentin 600mg  BID and 900mg  at night and states that she has been on this regimen for years. Patient reports some improvement but says that she does not think this medication works well because she has taken it for so long and inquired about anything stronger that could help. Discussed the possibility of using Mirapex and informed patient that we will reach out to the provider.   Mood/sleep: Patient is on mirtazapine and reports the mirtazapine is helping with her mood and sleep.   Adherence: Patient reports adherence to her medications. She does not have a pill box but reports that she sets out her pill bottles as her method of adherence. Based on pharmacy fill history, HCTZ is due for a refill - will send a refill request.    October 2021, PharmD Pharmacy Resident  10/28/2020 3:07 PM

## 2020-10-29 ENCOUNTER — Telehealth: Payer: Self-pay | Admitting: Pharmacist

## 2020-10-29 NOTE — Telephone Encounter (Signed)
10/29/2020 1:50:12 PM - Novolog Flexpen & tips refill to [facility]  -- [name] - Wednesday, October 29, 2020 1:48 PM --Received a notice from [employer] for refill on Novolog Flexpen Inject 5-7 units with meals  Max 21 units  # 2 boxes & Novofine 32G tips #4 boxes-Faxed today to [employer] to process for refill.

## 2020-10-30 ENCOUNTER — Ambulatory Visit: Payer: No Typology Code available for payment source

## 2020-10-30 ENCOUNTER — Other Ambulatory Visit: Payer: Self-pay

## 2020-10-30 DIAGNOSIS — R262 Difficulty in walking, not elsewhere classified: Secondary | ICD-10-CM

## 2020-10-30 DIAGNOSIS — M25562 Pain in left knee: Secondary | ICD-10-CM

## 2020-10-30 DIAGNOSIS — M6281 Muscle weakness (generalized): Secondary | ICD-10-CM

## 2020-10-30 NOTE — Therapy (Signed)
[facility] [address] [place], [address] Phone: [phone]   Fax:  [phone]  Physical Therapy Treatment  Patient Details  Name: Courtney Stafford MRN: [id number] Date of Birth: [birth year] Referring Provider (PT): Lewie Chamber, MD   Encounter Date: 10/30/2020   PT End of Session - 10/30/20 1538    Visit Number 12    Number of Visits 17    Date for PT Re-Evaluation 11/25/20    PT Start Time 1515    PT Stop Time 1600    PT Time Calculation (min) 45 min    Equipment Utilized During Treatment Gait belt    Activity Tolerance Patient tolerated treatment well;No increased pain    Behavior During Therapy WFL for tasks assessed/performed           Past Medical History:  Diagnosis Date  . Diabetes mellitus without complication (HCC)   . Hypertension   . Stroke [facility]) 07/31/2020    Past Surgical History:  Procedure Laterality Date  . CARPAL TUNNEL RELEASE  6 years ago   both hands    There were no vitals filed for this visit.   Subjective Assessment - 10/30/20 1519    Subjective Patient reports she was sick with a stomach issue which limited ability to attend PT on Tuesday. Patient states she feels tired today.    Pertinent History Recent CVA 07/30/20.  Left knee pain. Pt was seen by Allie Bossier in May 2021, who diagnosed knee OA questionable need for TKA in future, has recommended conservative management at this point. Pt had a cortizone injection in May (without avail) and is shceduled for a 'gel' injection on 6/28.    Limitations Walking;Lifting;Standing;House hold activities    How long can you sit comfortably? unlimited    How long can you stand comfortably? ~5 minutes    How long can you walk comfortably? ~2-3 minutes    Diagnostic tests Diagnosed CVA    Patient Stated Goals To be able to walk without the use of an assitive device.    Currently in Pain? No/denies    Pain Onset More than a  month ago                 TREATMENT Therapeutic Exercise NuStep in sitting level 4 for  5 minutes; level 7 for 1 minute -  to improve LE strength. Holding on jumping with UE support - x 12  Small Skaters on jumping with UE support - x 5 B Seated knee extension with YTB - 2 x 10  Ambulation without AD - x 476ft  with step ups otno  Hip abduction at hip machine - 2 x 10 25# Hip extension at hip machine - -2 x 10 40# B Squats in standing with UE support (chair as a goal) --x 15 Squats without UE support - x 10   Performed exercises to address strength limitations.     PT Education - 10/30/20 1526    Education Details form/technique with exercise    Person(s) Educated Patient    Methods Explanation;Demonstration    Comprehension Verbalized understanding;Returned demonstration            PT Short Term Goals - 08/12/20 1144      PT SHORT TERM GOAL #1   Title Patient will demonstrate independence with HEP to maximize rehab potential.    Time 4    Period Weeks    Status New    Target  Date 09/09/20             PT Long Term Goals - 10/14/20 1136      PT LONG TERM GOAL #1   Title Patient will demonstrate independence with progressive HEP to maintain and progress improvements achieved during therapy.    Baseline moderate cueing with exercise;    Time 8    Period Weeks    Status On-going      PT LONG TERM GOAL #2   Title Patient will have a FOTO score of >68/100 to show an increase in independence while performing functional daily activities.    Baseline 08/12/20: 49; 10/14/2020:    Time 8    Period Weeks    Status On-going      PT LONG TERM GOAL #3   Title Patient will demonstrate improved facility and steadiness in ambulation while performing at a gait speed of >0.53m/s.    Baseline 08/12/20: 0.37 m/s; 10/14/2020: .75 m/s    Time 8    Period Weeks    Status On-going      PT LONG TERM GOAL #4   Title Patient will improve 5xSTS and TUG score to under  12 sec to show an increase in patient strength/power to decrease patients fall risk.    Baseline 08/12/20: 5xSTS: 21.5sec, TUG: 25.7sec; 10/14/20: 5xSTS: 12.6sec, TUG: 15.7sec    Time 8    Period Weeks    Status On-going      PT LONG TERM GOAL #5   Title Patient will be able to reach up to grab items out of her cabinets with R UE at home to show an improvement in her strength and ROM to perform task independently.    Baseline 90 Degrees of Flexion due to weakness/unable to reach up to cabinet    Time 8    Period Weeks    Status New                 Plan - 10/30/20 1549    Clinical Impression Statement Continued to focus on improving hip strength trhoughout exercises performed today. Patient with improved ambulation with ability to perform >466ft of walking without UE support. Patient would benefit from use of a SPC for ambulation vs walker. However, patient has financial limitations to buying a SPC. Patient is making LE strength and walking improvements. Patient will benefit from further skilled therapy to return to prior level of function.    Personal Factors and Comorbidities Age;Behavior Pattern;Fitness;Transportation;Time since onset of injury/illness/exacerbation    Examination-Activity Limitations Toileting;Dressing;Sit;Transfers;Bed Mobility;Lift;Bend;Squat;Locomotion Level;Stairs;Carry;Stand;Reach Overhead    Examination-Participation Restrictions Yard Work;Community Activity;Shop;Driving    Stability/Clinical Decision Making Stable/Uncomplicated    Rehab Potential Good    PT Frequency 2x / week    PT Duration 8 weeks    PT Treatment/Interventions ADLs/Self Care Home Management;Cryotherapy;Electrical Stimulation;Moist Heat;DME Instruction;Gait training;Stair training;Functional mobility training;Therapeutic activities;Therapeutic exercise;Balance training;Neuromuscular re-education;Patient/family education;Manual techniques;Passive range of motion;Dry needling;Taping;Energy  conservation;Ultrasound    PT Next Visit Plan Exercises to increase R strength, Gait with SPC    PT Home Exercise Plan STS and walking with AD    Consulted and Agree with Plan of Care Patient           Patient will benefit from skilled therapeutic intervention in order to improve the following deficits and impairments:  Decreased endurance,Decreased mobility,Abnormal gait,Difficulty walking,Decreased range of motion,Obesity,Decreased activity tolerance,Decreased knowledge of use of DME,Decreased strength,Postural dysfunction,Pain,Improper body mechanics,Hypomobility,Decreased balance,Impaired perceived functional ability,Decreased safety awareness  Visit Diagnosis: Muscle weakness (generalized)  Difficulty in walking, not elsewhere classified  Chronic pain of left knee     Problem List Patient Active Problem List   Diagnosis Date Noted  . Protrusion of thoracic intervertebral disc 08/02/2020  . CVA (cerebral vascular accident) (HCC) 07/31/2020  . Urinary frequency 05/21/2020  . Unilateral primary osteoarthritis, left knee 03/17/2020  . Left knee pain 12/11/2019  . Swelling of lower leg 12/04/2019  . Pain and swelling of lower leg, left 12/04/2019  . Insomnia 08/28/2019  . 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 HTN (hypertension) 10/22/2015  . Type 1 diabetes (HCC) 10/22/2015  . Anemia 10/22/2015  . Restless legs 10/22/2015  . Hematuria 04/09/2015    Myrene Galas, PT DPT 10/30/2020, 3:59 PM  [facility] SPORTS MEDIC[facility] [address] [place], [address] Phone: [phone]   Fax:  [phone]  Name: Courtney Stafford MRN: [id number] Date of Birth: 1970/11/25

## 2020-11-04 ENCOUNTER — Other Ambulatory Visit: Payer: Self-pay

## 2020-11-04 ENCOUNTER — Ambulatory Visit: Payer: No Typology Code available for payment source

## 2020-11-04 DIAGNOSIS — R262 Difficulty in walking, not elsewhere classified: Secondary | ICD-10-CM

## 2020-11-04 DIAGNOSIS — M6281 Muscle weakness (generalized): Secondary | ICD-10-CM

## 2020-11-04 NOTE — Therapy (Signed)
[facility] [address], [place], [address] Phone: [phone]   Fax:  [phone]  Physical Therapy Treatment  Patient Details  Name: Courtney Stafford MRN: [id number] Date of Birth: [birth year] Referring Provider (PT): Lewie Chamber, MD   Encounter Date: 11/04/2020   PT End of Session - 11/04/20 1437    Visit Number 13    Number of Visits 17    Date for PT Re-Evaluation 11/25/20    PT Start Time 1421    PT Stop Time 1505    PT Time Calculation (min) 44 min    Equipment Utilized During Treatment Gait belt    Activity Tolerance Patient tolerated treatment well;No increased pain    Behavior During Therapy WFL for tasks assessed/performed           Past Medical History:  Diagnosis Date   Diabetes mellitus without complication (HCC)    Hypertension    Stroke (HCC) 07/31/2020    Past Surgical History:  Procedure Laterality Date   CARPAL TUNNEL RELEASE  6 years ago   both hands    There were no vitals filed for this visit.   Subjective Assessment - 11/04/20 1433    Subjective Patient states no major changes since the previous session. States she conitnues to have difficulty with walking for prolonged periods of time.    Pertinent History Recent CVA 07/30/20.  Left knee pain. Pt was seen by Allie Bossier in May 2021, who diagnosed knee OA questionable need for TKA in future, has recommended conservative management at this point. Pt had a cortizone injection in May (without avail) and is shceduled for a 'gel' injection on 6/28.    Limitations Walking;Lifting;Standing;House hold activities    How long can you sit comfortably? unlimited    How long can you stand comfortably? ~5 minutes    How long can you walk comfortably? ~2-3 minutes    Diagnostic tests Diagnosed CVA    Patient Stated Goals To be able to walk without the use of an assitive device.    Currently in Pain? No/denies    Pain Onset  More than a month ago               TREATMENT Therapeutic Exercise NuStep in sitting level 5 for 5 minutes; level 7 for 1 minute -  to improve LE strength. Tandem ambulation with CGA - 3 x 38ft Hip abduction at hip machine - 2 x 10 40#; x15 40# Ambulation without AD - x 544ft   Hip extension at hip machine -- 2 x 10 70# B Step ups onto 6 - x 10 B without UE support Monster walks with RTB around knees - 3 x15 ft   Performed exercises to address strength limitations.      PT Education - 11/04/20 1437    Education Details form/technique with exercise    Person(s) Educated Patient    Methods Explanation;Demonstration    Comprehension Verbalized understanding;Returned demonstration            PT Short Term Goals - 08/12/20 1144      PT SHORT TERM GOAL #1   Title Patient will demonstrate independence with HEP to maximize rehab potential.    Time 4    Period Weeks    Status New    Target Date 09/09/20             PT Long Term Goals - 10/14/20 1136  PT LONG TERM GOAL #1   Title Patient will demonstrate independence with progressive HEP to maintain and progress improvements achieved during therapy.    Baseline moderate cueing with exercise;    Time 8    Period Weeks    Status On-going      PT LONG TERM GOAL #2   Title Patient will have a FOTO score of >68/100 to show an increase in independence while performing functional daily activities.    Baseline 08/12/20: 49; 10/14/2020:    Time 8    Period Weeks    Status On-going      PT LONG TERM GOAL #3   Title Patient will demonstrate improved facility and steadiness in ambulation while performing at a gait speed of >0.69m/s.    Baseline 08/12/20: 0.37 m/s; 10/14/2020: .75 m/s    Time 8    Period Weeks    Status On-going      PT LONG TERM GOAL #4   Title Patient will improve 5xSTS and TUG score to under 12 sec to show an increase in patient strength/power to decrease patients fall risk.    Baseline  08/12/20: 5xSTS: 21.5sec, TUG: 25.7sec; 10/14/20: 5xSTS: 12.6sec, TUG: 15.7sec    Time 8    Period Weeks    Status On-going      PT LONG TERM GOAL #5   Title Patient will be able to reach up to grab items out of her cabinets with R UE at home to show an improvement in her strength and ROM to perform task independently.    Baseline 90 Degrees of Flexion due to weakness/unable to reach up to cabinet    Time 8    Period Weeks    Status New                 Plan - 11/04/20 1502    Clinical Impression Statement Patient demonstrates improvement with ability to perform greater amount of walking with less requirement for sitting rest breaks indicating functional carryover between sessions. Although patient is improving, she continues to have limitations in terms of cardiovascular endurance and hip/knee strength. Patient will benefit from further skilled therapy focused on improving limitations to reutrn to prior level of function.    Personal Factors and Comorbidities Age;Behavior Pattern;Fitness;Transportation;Time since onset of injury/illness/exacerbation    Examination-Activity Limitations Toileting;Dressing;Sit;Transfers;Bed Mobility;Lift;Bend;Squat;Locomotion Level;Stairs;Carry;Stand;Reach Overhead    Examination-Participation Restrictions Yard Work;Community Activity;Shop;Driving    Stability/Clinical Decision Making Stable/Uncomplicated    Rehab Potential Good    PT Frequency 2x / week    PT Duration 8 weeks    PT Treatment/Interventions ADLs/Self Care Home Management;Cryotherapy;Electrical Stimulation;Moist Heat;DME Instruction;Gait training;Stair training;Functional mobility training;Therapeutic activities;Therapeutic exercise;Balance training;Neuromuscular re-education;Patient/family education;Manual techniques;Passive range of motion;Dry needling;Taping;Energy conservation;Ultrasound    PT Next Visit Plan Exercises to increase R strength, Gait with SPC    PT Home Exercise Plan STS  and walking with AD    Consulted and Agree with Plan of Care Patient           Patient will benefit from skilled therapeutic intervention in order to improve the following deficits and impairments:  Decreased endurance,Decreased mobility,Abnormal gait,Difficulty walking,Decreased range of motion,Obesity,Decreased activity tolerance,Decreased knowledge of use of DME,Decreased strength,Postural dysfunction,Pain,Improper body mechanics,Hypomobility,Decreased balance,Impaired perceived functional ability,Decreased safety awareness  Visit Diagnosis: Muscle weakness (generalized)  Difficulty in walking, not elsewhere classified     Problem List Patient Active Problem List   Diagnosis Date Noted   Protrusion of thoracic intervertebral disc 08/02/2020   CVA (cerebral vascular accident) (  HCC) 07/31/2020   Urinary frequency 05/21/2020   Unilateral primary osteoarthritis, left knee 03/17/2020   Left knee pain 12/11/2019   Swelling of lower leg 12/04/2019   Pain and swelling of lower leg, left 12/04/2019   Insomnia 08/28/2019   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 [place], [address] Phone: [phone]   Fax:  [phone]  Name: VERLA BRYNGELSON MRN: [id number] Date of Birth: August 05, 1971

## 2020-11-13 ENCOUNTER — Ambulatory Visit: Payer: No Typology Code available for payment source

## 2020-11-18 ENCOUNTER — Other Ambulatory Visit: Payer: Self-pay

## 2020-11-18 ENCOUNTER — Ambulatory Visit: Payer: No Typology Code available for payment source | Attending: Gerontology

## 2020-11-18 DIAGNOSIS — G8929 Other chronic pain: Secondary | ICD-10-CM | POA: Insufficient documentation

## 2020-11-18 DIAGNOSIS — M6281 Muscle weakness (generalized): Secondary | ICD-10-CM | POA: Insufficient documentation

## 2020-11-18 DIAGNOSIS — M25562 Pain in left knee: Secondary | ICD-10-CM | POA: Insufficient documentation

## 2020-11-18 DIAGNOSIS — R262 Difficulty in walking, not elsewhere classified: Secondary | ICD-10-CM | POA: Insufficient documentation

## 2020-11-19 ENCOUNTER — Ambulatory Visit: Payer: Self-pay | Admitting: Licensed Clinical Social Worker

## 2020-11-19 ENCOUNTER — Other Ambulatory Visit: Payer: Self-pay | Admitting: Gerontology

## 2020-11-19 DIAGNOSIS — G2581 Restless legs syndrome: Secondary | ICD-10-CM

## 2020-11-19 NOTE — Therapy (Signed)
[place] [facility] [address], [place], [address] Phone: [phone]   Fax:  [phone]  Physical Therapy Treatment  Patient Details  Name: Courtney Stafford MRN: [id number] Date of Birth: 1971-03-24 Referring Provider (PT): Dwyane Dee, MD   Encounter Date: 11/18/2020   PT End of Session - 11/18/20 1359    Visit Number 14    Number of Visits 17    Date for PT Re-Evaluation 11/25/20    PT Start Time 3875    PT Stop Time 1430    PT Time Calculation (min) 45 min    Equipment Utilized During Treatment Gait belt    Activity Tolerance Patient tolerated treatment well;No increased pain    Behavior During Therapy WFL for tasks assessed/performed           Past Medical History:  Diagnosis Date  . Diabetes mellitus without complication ([place])   . Hypertension   . Stroke [facility]) 07/31/2020    Past Surgical History:  Procedure Laterality Date  . CARPAL TUNNEL RELEASE  6 years ago   both hands    There were no vitals filed for this visit.   Subjective Assessment - 11/18/20 1353    Subjective Patient states things have "been okay". Patient states shes been having less overall pain in the knees but states she "has her days".    Pertinent History Recent CVA 07/30/20.  Left knee pain. Pt was seen by Zollie Beckers in May 2021, who diagnosed knee OA questionable need for TKA in future, has recommended conservative management at this point. Pt had a cortizone injection in May (without avail) and is shceduled for a 'gel' injection on 6/28.    Limitations Walking;Lifting;Standing;House hold activities    How long can you sit comfortably? unlimited    How long can you stand comfortably? ~5 minutes    How long can you walk comfortably? ~2-3 minutes    Diagnostic tests Diagnosed CVA    Patient Stated Goals To be able to walk without the use of an assitive device.    Currently in Pain? No/denies    Pain Onset More than a  month ago              TREATMENT Therapeutic Exercise NuStep in sitting level 6 for 5 minutes; -  to improve LE strength. Tandem ambulation with CGA - 3 x 12ft Hip abduction in standing - 2 x 15 Hip extension in standing - 2 x 15 Sit to stands with 10# weight overhead when in standing - 2 x 10 Lunges in standing - x 10 Ambulation without AD - x 364ft   Step ups onto 6" - x 10 B without UE support Monster walks with RTB around knees - 3 x15 ft   Performed exercises to address strength limitations.     PT Education - 11/18/20 1359    Education Details form/technique with exercise    Person(s) Educated Patient    Methods Explanation;Demonstration    Comprehension Verbalized understanding;Returned demonstration            PT Short Term Goals - 08/12/20 1144      PT SHORT TERM GOAL #1   Title Patient will demonstrate independence with HEP to maximize rehab potential.    Time 4    Period Weeks    Status New    Target Date 09/09/20             PT Long Term Goals -  10/14/20 1136      PT LONG TERM GOAL #1   Title Patient will demonstrate independence with progressive HEP to maintain and progress improvements achieved during therapy.    Baseline moderate cueing with exercise;    Time 8    Period Weeks    Status On-going      PT LONG TERM GOAL #2   Title Patient will have a FOTO score of >68/100 to show an increase in independence while performing functional daily activities.    Baseline 08/12/20: 49; 10/14/2020:    Time 8    Period Weeks    Status On-going      PT LONG TERM GOAL #3   Title Patient will demonstrate improved facility and steadiness in ambulation while performing at a gait speed of >0.49m/s.    Baseline 08/12/20: 0.37 m/s; 10/14/2020: .75 m/s    Time 8    Period Weeks    Status On-going      PT LONG TERM GOAL #4   Title Patient will improve 5xSTS and TUG score to under 12 sec to show an increase in patient strength/power to decrease  patients fall risk.    Baseline 08/12/20: 5xSTS: 21.5sec, TUG: 25.7sec; 10/14/20: 5xSTS: 12.6sec, TUG: 15.7sec    Time 8    Period Weeks    Status On-going      PT LONG TERM GOAL #5   Title Patient will be able to reach up to grab items out of her cabinets with R UE at home to show an improvement in her strength and ROM to perform task independently.    Baseline 90 Degrees of Flexion due to weakness/unable to reach up to cabinet    Time 8    Period Weeks    Status New                 Plan - 11/19/20 0175    Clinical Impression Statement Patient conitnues to have difficulty with performing exercises in standing, secondary to overall cardiovascular fatigue versus musuclar fatigues. Patient continues to have weakness along her quadriceps B, however this is improving overall as indicated by improvement with loaded squats and ability to perform lunges, although with increased difficulty. Patient will benefit from addressing these limitations to return to prior level of function.    Personal Factors and Comorbidities Age;Behavior Pattern;Fitness;Transportation;Time since onset of injury/illness/exacerbation    Examination-Activity Limitations Toileting;Dressing;Sit;Transfers;Bed Mobility;Lift;Bend;Squat;Locomotion Level;Stairs;Carry;Stand;Reach Overhead    Examination-Participation Restrictions Yard Work;Community Activity;Shop;Driving    Stability/Clinical Decision Making Stable/Uncomplicated    Rehab Potential Good    PT Frequency 2x / week    PT Duration 8 weeks    PT Treatment/Interventions ADLs/Self Care Home Management;Cryotherapy;Electrical Stimulation;Moist Heat;DME Instruction;Gait training;Stair training;Functional mobility training;Therapeutic activities;Therapeutic exercise;Balance training;Neuromuscular re-education;Patient/family education;Manual techniques;Passive range of motion;Dry needling;Taping;Energy conservation;Ultrasound    PT Next Visit Plan Exercises to increase R  strength, Gait with SPC    PT Home Exercise Plan STS and walking with AD    Consulted and Agree with Plan of Care Patient           Patient will benefit from skilled therapeutic intervention in order to improve the following deficits and impairments:  Decreased endurance,Decreased mobility,Abnormal gait,Difficulty walking,Decreased range of motion,Obesity,Decreased activity tolerance,Decreased knowledge of use of DME,Decreased strength,Postural dysfunction,Pain,Improper body mechanics,Hypomobility,Decreased balance,Impaired perceived functional ability,Decreased safety awareness  Visit Diagnosis: Muscle weakness (generalized)  Difficulty in walking, not elsewhere classified  Chronic pain of left knee     Problem List Patient Active Problem List  Diagnosis Date Noted  . Protrusion of thoracic intervertebral disc 08/02/2020  . CVA (cerebral vascular accident) (HCC) 07/31/2020  . Urinary frequency 05/21/2020  . Unilateral primary osteoarthritis, left knee 03/17/2020  . Left knee pain 12/11/2019  . Swelling of lower leg 12/04/2019  . Pain and swelling of lower leg, left 12/04/2019  . Insomnia 08/28/2019  . 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 HTN (hypertension) 10/22/2015  . Type 1 diabetes (HCC) 10/22/2015  . Anemia 10/22/2015  . Restless legs 10/22/2015  . Hematuria 04/09/2015    Myrene Galas, PT DPT 11/19/2020, 9:41 AM  [place] [facility] [address] [place], [address] Phone: [phone]   Fax:  [phone]  Name: Courtney Stafford MRN: [id number] Date of Birth: 01-Oct-1971

## 2020-11-25 ENCOUNTER — Ambulatory Visit: Payer: No Typology Code available for payment source

## 2020-11-25 ENCOUNTER — Other Ambulatory Visit: Payer: Self-pay

## 2020-11-25 ENCOUNTER — Other Ambulatory Visit: Payer: Self-pay | Admitting: Gerontology

## 2020-11-25 DIAGNOSIS — G8929 Other chronic pain: Secondary | ICD-10-CM

## 2020-11-25 DIAGNOSIS — M25562 Pain in left knee: Secondary | ICD-10-CM

## 2020-11-25 DIAGNOSIS — R262 Difficulty in walking, not elsewhere classified: Secondary | ICD-10-CM

## 2020-11-25 DIAGNOSIS — M6281 Muscle weakness (generalized): Secondary | ICD-10-CM

## 2020-11-25 DIAGNOSIS — G2581 Restless legs syndrome: Secondary | ICD-10-CM

## 2020-11-26 ENCOUNTER — Encounter: Payer: Self-pay | Admitting: Gerontology

## 2020-11-26 ENCOUNTER — Ambulatory Visit: Payer: Self-pay | Admitting: Gerontology

## 2020-11-26 ENCOUNTER — Other Ambulatory Visit: Payer: Self-pay | Admitting: Gerontology

## 2020-11-26 VITALS — BP 156/103 | HR 89 | Temp 97.0°F | Resp 16 | Wt 193.5 lb

## 2020-11-26 DIAGNOSIS — I10 Essential (primary) hypertension: Secondary | ICD-10-CM

## 2020-11-26 DIAGNOSIS — G2581 Restless legs syndrome: Secondary | ICD-10-CM

## 2020-11-26 DIAGNOSIS — E109 Type 1 diabetes mellitus without complications: Secondary | ICD-10-CM

## 2020-11-26 MED ORDER — HYDRALAZINE HCL 25 MG PO TABS: 25.0000 mg | ORAL_TABLET | Freq: Two times a day (BID) | ORAL | 2 refills | Status: DC

## 2020-11-26 NOTE — Progress Notes (Signed)
Established Patient Office Visit  Subjective:  Patient ID: Courtney Stafford, female    DOB: 05-Nov-1971  Age: 50 y.o. MRN: [id number]  CC: No chief complaint on file.   HPI Courtney Stafford presents for follow up of elevated blood pressure. She states that she's compliant with her medications and denies any medication side effects. She was started on 25 mg Hydralazine 4 weeks ago and brought her one month blood pressure log. Her SBP ranges between 133-173 and DBP ranges between 88-117. She reports that she consumes canned food. She denies chest pain, palpitation and light headedness. Overall, she states that she's doing well and offers no further complaint.  Past Medical History:  Diagnosis Date  . Diabetes mellitus without complication ([place])   . Hypertension   . Stroke [facility]) 07/31/2020    Past Surgical History:  Procedure Laterality Date  . CARPAL TUNNEL RELEASE  6 years ago   both hands    Family History  Problem Relation Age of Onset  . Cancer Father   . Diabetes Maternal Grandmother   . Breast cancer Neg Hx     Social History   Socioeconomic History  . Marital status: Single    Spouse name: Not on file  . Number of children: 2  . Years of education: Not on file  . Highest education level: High school graduate  Occupational History  . Occupation: unemployed  Tobacco Use  . Smoking status: Current Some Day Smoker    Packs/day: 0.01    Types: Cigarettes  . Smokeless tobacco: Former Systems developer  . Tobacco comment: occasional  Vaping Use  . Vaping Use: Never used  Substance and Sexual Activity  . Alcohol use: No  . Drug use: No  . Sexual activity: Not on file  Other Topics Concern  . Not on file  Social History Narrative   Completed biopsychosocial assessment, social determinants screening, administered PHQ 9, and GAD 7.       Social determinants screening completed 01/31/2020. HS She receives bus passes [facility] for [facility]. She receives food  stamps 192 per month and utilizes a Building surveyor when needed. [employer] pays for housing and utilities.  She follows up with therapist at clinic for stress, anxiety, and depression. HS      Support system is small.   Social Determinants of Health   Financial Resource Strain: Low Risk   . Difficulty of Paying Living Expenses: Not very hard  Food Insecurity: No Food Insecurity  . Worried About Charity fundraiser in the Last Year: Never true  . Ran Out of Food in the Last Year: Never true  Transportation Needs: Unmet Transportation Needs  . Lack of Transportation (Medical): Yes  . Lack of Transportation (Non-Medical): No  Physical Activity: Insufficiently Active  . Days of Exercise per Week: 3 days  . Minutes of Exercise per Session: 40 min  Stress: Stress Concern Present  . Feeling of Stress : Rather much  Social Connections: Socially Isolated  . Frequency of Communication with Friends and Family: Never  . Frequency of Social Gatherings with Friends and Family: Never  . Attends Religious Services: Never  . Active Member of Clubs or Organizations: No  . Attends Archivist Meetings: Never  . Marital Status: Never married  Intimate Partner Violence: Not At Risk  . Fear of Current or Ex-Partner: No  . Emotionally Abused: No  . Physically Abused: No  . Sexually Abused: No    Outpatient  Medications Prior to Visit  Medication Sig Dispense Refill  . amLODipine (NORVASC) 10 MG tablet Take 1 tablet (10 mg total) by mouth daily. 90 tablet 0  . aspirin EC 81 MG EC tablet Take 1 tablet (81 mg total) by mouth daily. Swallow whole. 30 tablet 11  . atorvastatin (LIPITOR) 80 MG tablet Take 1 tablet (80 mg total) by mouth daily. 90 tablet 3  . carvedilol (COREG) 12.5 MG tablet Take 1 tablet (12.5 mg total) by mouth 2 (two) times daily with a meal. 60 tablet 3  . DULoxetine (CYMBALTA) 60 MG capsule Take 1 capsule (60 mg total) by mouth every morning. 30 capsule 2  . gabapentin  (NEURONTIN) 300 MG capsule TAKE 2 CAPSULES (600MG) BY MOUTH 2 TIMES A DAY AND 3 CAPSULES (900MG) AT NIGHT 210 capsule 0  . hydrochlorothiazide (HYDRODIURIL) 25 MG tablet Take 1 tablet (25 mg total) by mouth daily. 90 tablet 1  . insulin aspart (NOVOLOG FLEXPEN) 100 UNIT/ML FlexPen She states taking 5-7 units with meals and adjust with sliding scale as prescribed by Endocrinology.. (Patient taking differently: She states taking 5-9 units with meals and adjust with sliding scale as prescribed by Endocrinology.Marland Kitchen) 75 mL 3  . Insulin Pen Needle (BD PEN NEEDLE NANO U/F) 32G X 4 MM MISC 5 times daily (Patient taking differently: 3 to 4 times daily) 200 each 12  . lisinopril (ZESTRIL) 40 MG tablet Take 1 tablet (40 mg total) by mouth daily. 90 tablet 0  . meloxicam (MOBIC) 7.5 MG tablet Take 1 tablet (7.5 mg total) by mouth daily as needed for pain. 30 tablet 0  . hydrALAZINE (APRESOLINE) 25 MG tablet Take 1 tablet (25 mg total) by mouth daily. 40 tablet 0  . Blood Glucose Monitoring Suppl Supplies MISC 1 each by Does not apply route 5 (five) times daily. (Patient taking differently: 1 each by Does not apply route. Check 3 to 4 times daily) 500 each 4  . insulin glargine (LANTUS) 100 UNIT/ML Solostar Pen Inject 30 Units into the skin at bedtime. 30 mL 4  . mirtazapine (REMERON) 30 MG tablet Take 1 tablet (30 mg total) by mouth at bedtime. 30 tablet 2   No facility-administered medications prior to visit.    Allergies  Allergen Reactions  . Phenergan [Promethazine] Hives    ROS Review of Systems  Constitutional: Negative.   Eyes: Negative.   Respiratory: Negative.   Cardiovascular: Negative.   Neurological: Negative.   Psychiatric/Behavioral: Negative.       Objective:    Physical Exam HENT:     Head: Normocephalic and atraumatic.  Eyes:     Pupils: Pupils are equal, round, and reactive to light.  Cardiovascular:     Rate and Rhythm: Normal rate and regular rhythm.     Pulses: Normal  pulses.     Heart sounds: Normal heart sounds.  Pulmonary:     Effort: Pulmonary effort is normal.     Breath sounds: Normal breath sounds.  Neurological:     General: No focal deficit present.     Mental Status: She is alert and oriented to person, place, and time. Mental status is at baseline.  Psychiatric:        Mood and Affect: Mood normal.        Behavior: Behavior normal.        Thought Content: Thought content normal.        Judgment: Judgment normal.     BP (!) 156/103 (BP Location: Left  Arm, Patient Position: Sitting, Cuff Size: Large)   Pulse 89   Temp (!) 97 F (36.1 C)   Resp 16   Wt 193 lb 8 oz (87.8 kg)   LMP 01/03/2016 (Within Years)   SpO2 98%   BMI 37.79 kg/m  Wt Readings from Last 3 Encounters:  11/26/20 193 lb 8 oz (87.8 kg)  10/22/20 201 lb (91.2 kg)  07/31/20 200 lb (90.7 kg)   She lost 8 pounds in one month, she was encouraged to continue on her weight loss regimen.  Health Maintenance Due  Topic Date Due  . Hepatitis C Screening  Never done  . COVID-19 Vaccine (1) Never done  . HIV Screening  Never done  . TETANUS/TDAP  Never done  . COLONOSCOPY (Pts 45-55yr Insurance coverage will need to be confirmed)  Never done  . OPHTHALMOLOGY EXAM  03/23/2019  . INFLUENZA VACCINE  06/15/2020    There are no preventive care reminders to display for this patient.  Lab Results  Component Value Date   TSH 0.951 07/31/2020   Lab Results  Component Value Date   WBC 9.2 08/02/2020   HGB 16.0 (H) 08/02/2020   HCT 48.5 (H) 08/02/2020   MCV 83.9 08/02/2020   PLT 328 08/02/2020   Lab Results  Component Value Date   NA 140 08/02/2020   K 3.4 (L) 08/02/2020   CO2 25 08/02/2020   GLUCOSE 46 (L) 08/02/2020   BUN 13 08/02/2020   CREATININE 0.54 08/02/2020   BILITOT 0.6 07/31/2020   ALKPHOS 99 07/31/2020   AST 18 07/31/2020   ALT 18 07/31/2020   PROT 7.9 07/31/2020   ALBUMIN 4.0 07/31/2020   CALCIUM 9.1 08/02/2020   ANIONGAP 11 08/02/2020   Lab  Results  Component Value Date   CHOL 218 (H) 07/31/2020   Lab Results  Component Value Date   HDL 78 07/31/2020   Lab Results  Component Value Date   LDLCALC 109 (H) 07/31/2020   Lab Results  Component Value Date   TRIG 157 (H) 07/31/2020   Lab Results  Component Value Date   CHOLHDL 2.8 07/31/2020   Lab Results  Component Value Date   HGBA1C 8.4 (H) 10/15/2020      Assessment & Plan:     1. Essential hypertension - Her blood pressure is not under control, Hydralazine was increased to 25 mg bid. She was advised to decrease the consumption of canned food, exercise as tolerated. She is to check her blood pressure, record and bring log to follow up appointment. Her goal blood pressure should be less than 140/90. - hydrALAZINE (APRESOLINE) 25 MG tablet; Take 1 tablet (25 mg total) by mouth 2 (two) times daily.  Dispense: 60 tablet; Refill: 2 - Ambulatory referral to Ophthalmology - Comp Met (CMET); Future  2. Type 1 diabetes mellitus without complication ([place] - She will continue on current treatment regimen, low carb/ non concentrated sweet diet. - HgB A1c; Future - Comp Met (CMET); Future  Follow-up: Return in about 8 weeks (around 01/21/2021), or if symptoms worsen or fail to improve.    Aariz Maish EJerold Coombe NP

## 2020-11-26 NOTE — Therapy (Signed)
[place] [facility] [address] [place], [address] Phone: [phone]   Fax:  [phone]  Physical Therapy Treatment  Patient Details  Name: Courtney Stafford MRN: [id number] Date of Birth: Nov 24, 1970 Referring Provider (PT): Lewie Chamber, MD   Encounter Date: 11/25/2020   PT End of Session - 11/26/20 0814    Visit Number 15    Number of Visits 17    Date for PT Re-Evaluation 11/25/20    PT Start Time 1345    PT Stop Time 1430    PT Time Calculation (min) 45 min    Equipment Utilized During Treatment Gait belt    Activity Tolerance Patient tolerated treatment well;No increased pain    Behavior During Therapy WFL for tasks assessed/performed           Past Medical History:  Diagnosis Date  . Diabetes mellitus without complication (H[facility])   . Hypertension   . Stroke [facility]) 07/31/2020    Past Surgical History:  Procedure Laterality Date  . CARPAL TUNNEL RELEASE  6 years ago   both hands    There were no vitals filed for this visit.   Subjective Assessment - 11/25/20 1412    Subjective Patient states her L knee has been bothering her more starting last night. Patient reports improvement overall.    Pertinent History Recent CVA 07/30/20.  Left knee pain. Pt was seen by Allie Bossier in May 2021, who diagnosed knee OA questionable need for TKA in future, has recommended conservative management at this point. Pt had a cortizone injection in May (without avail) and is shceduled for a 'gel' injection on 6/28.    Limitations Walking;Lifting;Standing;House hold activities    How long can you sit comfortably? unlimited    How long can you stand comfortably? ~5 minutes    How long can you walk comfortably? ~2-3 minutes    Diagnostic tests Diagnosed CVA    Patient Stated Goals To be able to walk without the use of an assitive device.    Currently in Pain? No/denies    Pain Onset More than a month ago                TREATMENT Therapeutic Exercise NuStep in sitting level 5 for 5 minutes; -  to improve LE strength. Hip abduction in standing - 2 x 15 with hip machine 40# Sit to stands with lifting weight overhead - 2 x 12 10#  Monster walks with BTB around knees - 3 x20 ft B RDLs in standing - 2 x 12 SLR in sitting - 2 x 10  Hip abduction education to be performed with tighter band - 2 x 15   Performed exercises to address strength limitations.           PT Education - 11/25/20 1433    Education Details form/technique with exercises    Person(s) Educated Patient    Methods Explanation;Demonstration    Comprehension Verbalized understanding;Returned demonstration            PT Short Term Goals - 08/12/20 1144      PT SHORT TERM GOAL #1   Title Patient will demonstrate independence with HEP to maximize rehab potential.    Time 4    Period Weeks    Status New    Target Date 09/09/20             PT Long Term Goals - 10/14/20 1136      PT  LONG TERM GOAL #1   Title Patient will demonstrate independence with progressive HEP to maintain and progress improvements achieved during therapy.    Baseline moderate cueing with exercise;    Time 8    Period Weeks    Status On-going      PT LONG TERM GOAL #2   Title Patient will have a FOTO score of >68/100 to show an increase in independence while performing functional daily activities.    Baseline 08/12/20: 49; 10/14/2020:    Time 8    Period Weeks    Status On-going      PT LONG TERM GOAL #3   Title Patient will demonstrate improved facility and steadiness in ambulation while performing at a gait speed of >0.5m/s.    Baseline 08/12/20: 0.37 m/s; 10/14/2020: .75 m/s    Time 8    Period Weeks    Status On-going      PT LONG TERM GOAL #4   Title Patient will improve 5xSTS and TUG score to under 12 sec to show an increase in patient strength/power to decrease patients fall risk.    Baseline 08/12/20: 5xSTS: 21.5sec,  TUG: 25.7sec; 10/14/20: 5xSTS: 12.6sec, TUG: 15.7sec    Time 8    Period Weeks    Status On-going      PT LONG TERM GOAL #5   Title Patient will be able to reach up to grab items out of her cabinets with R UE at home to show an improvement in her strength and ROM to perform task independently.    Baseline 90 Degrees of Flexion due to weakness/unable to reach up to cabinet    Time 8    Period Weeks    Status New                 Plan - 11/26/20 0815    Clinical Impression Statement Delayed Reassessment secondary to limited visits since previous progress note, will perform next visit. Patient continues to demonstrates increased difficulty with prolonged exercises that require significant cardiovascular and muscular endurace. Will continue to address this concern considering this is the most limiting factor in return to baseline function before CVA. Patient continues to have L knee pain, however this is improving overall. Patient will benefit from further skilled therapy to return to prior level of function.    Personal Factors and Comorbidities Age;Behavior Pattern;Fitness;Transportation;Time since onset of injury/illness/exacerbation    Examination-Activity Limitations Toileting;Dressing;Sit;Transfers;Bed Mobility;Lift;Bend;Squat;Locomotion Level;Stairs;Carry;Stand;Reach Overhead    Examination-Participation Restrictions Yard Work;Community Activity;Shop;Driving    Stability/Clinical Decision Making Stable/Uncomplicated    Rehab Potential Good    PT Frequency 2x / week    PT Duration 8 weeks    PT Treatment/Interventions ADLs/Self Care Home Management;Cryotherapy;Electrical Stimulation;Moist Heat;DME Instruction;Gait training;Stair training;Functional mobility training;Therapeutic activities;Therapeutic exercise;Balance training;Neuromuscular re-education;Patient/family education;Manual techniques;Passive range of motion;Dry needling;Taping;Energy conservation;Ultrasound    PT Next Visit  Plan Exercises to increase R strength, Gait with SPC    PT Home Exercise Plan STS and walking with AD    Consulted and Agree with Plan of Care Patient           Patient will benefit from skilled therapeutic intervention in order to improve the following deficits and impairments:  Decreased endurance,Decreased mobility,Abnormal gait,Difficulty walking,Decreased range of motion,Obesity,Decreased activity tolerance,Decreased knowledge of use of DME,Decreased strength,Postural dysfunction,Pain,Improper body mechanics,Hypomobility,Decreased balance,Impaired perceived functional ability,Decreased safety awareness  Visit Diagnosis: Muscle weakness (generalized)  Difficulty in walking, not elsewhere classified  Chronic pain of left knee     Problem List  Patient Active Problem List   Diagnosis Date Noted  . Protrusion of thoracic intervertebral disc 08/02/2020  . CVA (cerebral vascular accident) ([facility]) 07/31/2020  . Urinary frequency 05/21/2020  . Unilateral primary osteoarthritis, left knee 03/17/2020  . Left knee pain 12/11/2019  . Swelling of lower leg 12/04/2019  . Pain and swelling of lower leg, left 12/04/2019  . Insomnia 08/28/2019  . 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 HTN (hypertension) 10/22/2015  . Type 1 diabetes (H[facility]) 10/22/2015  . Anemia 10/22/2015  . Restless legs 10/22/2015  . Hematuria 04/09/2015    Myrene Galas, PT DPT 11/26/2020, 8:20 AM  [place] [facility] [address] [place], [address] Phone: [phone]   Fax:  [phone]  Name: GINAMARIE BANFIELD MRN: [id number] Date of Birth: June 15, 1971

## 2020-11-26 NOTE — Patient Instructions (Signed)

## 2020-11-27 ENCOUNTER — Ambulatory Visit: Payer: Self-pay | Admitting: Licensed Clinical Social Worker

## 2020-11-27 ENCOUNTER — Other Ambulatory Visit: Payer: Self-pay

## 2020-11-27 DIAGNOSIS — F411 Generalized anxiety disorder: Secondary | ICD-10-CM

## 2020-11-27 DIAGNOSIS — F331 Major depressive disorder, recurrent, moderate: Secondary | ICD-10-CM

## 2020-11-27 NOTE — BH Specialist Note (Signed)
Integrated Behavioral Health Follow Up In-Person Visit  MRN: [id number] Name: Courtney Stafford   Total time: 60 minutes  Types of Service: Telephone visit  Interpretor:No. Interpretor Name and Language:   Subjective: Courtney Stafford is a 50 y.o. female accompanied by herself Patient was referred by Hurman Horn, NP  for mental health Patient reports the following symptoms/concerns: The patient states that, "nothing exciting has been going." She states that she was able to go see her daughter and grandchildren for Christmas. The patient stated that she is going to her physical therapy appointments and that she is regaining some movement in her right hand. She notes that her knee is getting worse and she is in constant pain. The patient discussed financial and health stressors. She stated that her medicine for her blood pressure has changed and she is very tired throughout the day. The patient reports that she is scared she won't receive her SNAP benefits anymore because she can not physically volunteer since her stroke. The patient reports that without her SNAP benefits she won't be able to afford food. She notes that she wants to apply for SSDI. However she states that she doesn't know how to apply and can not fill out forms very well since her stroke and would like someone to help her. The patient denies any suicidal or homicidal thoughts at this time. Duration of problem: ; Severity of problem: moderate  Objective: Mood: Euthymic and Affect: Appropriate Risk of harm to self or others: No plan to harm self or others  Life Context: Family and Social: see above School/Work: see above Self-Care: see above Life Changes: see above Patient and/or Family's Strengths/Protective Factors: Concrete supports in place (healthy food, safe environments, etc.)  Goals Addressed: Patient will: 1.  Reduce symptoms of: anxiety, depression and stress  2.  Increase knowledge and/or ability of: coping  skills, healthy habits and stress reduction  3.  Demonstrate ability to: Increase healthy adjustment to current life circumstances  Progress towards Goals: Ongoing  Interventions: Interventions utilized:  Supportive Counseling was utilized by the clinician during today's follow up session. Clinician processed with the patient regarding how she has been doing since the last follow up session. Clinician measured the patent's anxiety and depression on a numerical scale. Clinician provided space for the patient to ventilate her frustrations and concerns regarding her current situation. Clinician offered reassurance that she would advocate on behalf of the patient should her SNAP benefits not be renewed and offered to connect the patient to outside resources that may assist her in filling a disability claim. Clinician encouraged the patient to take her medication as prescribed by her physician and to discuss any side effect such as the drowsiness with them or her pharmacist.  Standardized Assessments completed: GAD-7 and PHQ 2  GAD-7     13 PHQ-9     19   Assessment: Patient currently experiencing see above.   Patient may benefit from see above  Plan: 1. Follow up with behavioral health clinician on : 12/10/2020 at 11:00 AM  2. Behavioral recommendations:  3. Referral(s): [facility] [employer] (In Clinic) 4. "From scale of 1-10, how likely are you to follow plan?":  Judith Part, Student-Social Work

## 2020-12-02 ENCOUNTER — Ambulatory Visit: Payer: No Typology Code available for payment source

## 2020-12-04 ENCOUNTER — Ambulatory Visit: Payer: No Typology Code available for payment source

## 2020-12-04 ENCOUNTER — Other Ambulatory Visit: Payer: Self-pay

## 2020-12-04 DIAGNOSIS — M6281 Muscle weakness (generalized): Secondary | ICD-10-CM

## 2020-12-04 DIAGNOSIS — R262 Difficulty in walking, not elsewhere classified: Secondary | ICD-10-CM

## 2020-12-04 NOTE — Therapy (Signed)
[place] [facility] [address], [place], [address] Phone: [phone]   Fax:  [phone]  Physical Therapy Treatment  Patient Details  Name: Courtney Stafford MRN: [id number] Date of Birth: 08-04-1971 Referring Provider (PT): Lewie Chamber, MD   Encounter Date: 12/04/2020   PT End of Session - 12/04/20 1614    Visit Number 16    Number of Visits 17    Date for PT Re-Evaluation 01/15/21    PT Start Time 1515    PT Stop Time 1600    PT Time Calculation (min) 45 min    Equipment Utilized During Treatment Gait belt    Activity Tolerance Patient tolerated treatment well;No increased pain    Behavior During Therapy WFL for tasks assessed/performed           Past Medical History:  Diagnosis Date  . Diabetes mellitus without complication (HCC)   . Hypertension   . Stroke [facility]) 07/31/2020    Past Surgical History:  Procedure Laterality Date  . CARPAL TUNNEL RELEASE  6 years ago   both hands    There were no vitals filed for this visit.   Subjective Assessment - 12/04/20 1612    Subjective Patient states her L knee has been bothering her more starting last night. Patient reports improvement overall.    Pertinent History Recent CVA 07/30/20.  Left knee pain. Pt was seen by Allie Bossier in May 2021, who diagnosed knee OA questionable need for TKA in future, has recommended conservative management at this point. Pt had a cortizone injection in May (without avail) and is shceduled for a 'gel' injection on 6/28.    Limitations Walking;Lifting;Standing;House hold activities    How long can you sit comfortably? unlimited    How long can you stand comfortably? ~5 minutes    How long can you walk comfortably? ~2-3 minutes    Diagnostic tests Diagnosed CVA    Patient Stated Goals To be able to walk without the use of an assitive device.    Currently in Pain? No/denies    Pain Onset More than a month ago            TREATMENT Therapeutic Exercise Sit to stands -- 2 x 5 with speed without UE support Sit to stands from 14.5" height -- x 10 Nustep for improvement in endurance -- x5 min level 6 with  Ambulation with UE support or AD -- x838ft Lumbar extension in standing -- x 15 Step ups without UE support -- x 15 4"  Educated patient on importance of LE strengthening, HEP and perceived versus actual function.     PT Education - 12/04/20 1614    Education Details form/technique with exercise    Person(s) Educated Patient    Methods Explanation;Demonstration    Comprehension Returned demonstration;Verbalized understanding            PT Short Term Goals - 08/12/20 1144      PT SHORT TERM GOAL #1   Title Patient will demonstrate independence with HEP to maximize rehab potential.    Time 4    Period Weeks    Status New    Target Date 09/09/20             PT Long Term Goals - 12/04/20 1530      PT LONG TERM GOAL #1   Title Patient will demonstrate independence with progressive HEP to maintain and progress improvements achieved during therapy.  Baseline moderate cueing with exercise; 1/20/2022minimal cueing to perform    Time 8    Period Weeks    Status On-going      PT LONG TERM GOAL #2   Title Patient will have a FOTO score of >68/100 to show an increase in independence while performing functional daily activities.    Baseline 08/12/20: 49; 10/14/2020: 52; 12/04/2020; 51    Time 8    Period Weeks    Status On-going      PT LONG TERM GOAL #3   Title Patient will demonstrate improved facility and steadiness in ambulation while performing at a gait speed of >0.22m/s.    Baseline 08/12/20: 0.37 m/s; 10/14/2020: .75 m/s; 12/04/2020: .827 m/s    Time 8    Period Weeks    Status Achieved      PT LONG TERM GOAL #4   Title Patient will improve 5xSTS and TUG score to under 12 sec to show an increase in patient strength/power to decrease patients fall risk.    Baseline  08/12/20: 5xSTS: 21.5sec, TUG: 25.7sec; 10/14/20: 5xSTS: 12.6sec, TUG: 15.7sec; 12/05/2019: 5xSTS: 11.25 sec; TUG: 11.75 sec    Time 8    Period Weeks    Status On-going      PT LONG TERM GOAL #5   Title Patient will be able to reach up to grab items out of her cabinets with R UE at home to show an improvement in her strength and ROM to perform task independently.    Baseline 90 Degrees of Flexion due to weakness/unable to reach up to cabinet; 12/04/2020: Full AROM    Time 8    Period Weeks    Status Achieved      Additional Long Term Goals   Additional Long Term Goals Yes      PT LONG TERM GOAL #6   Title Patient will improve to over 1350 ft to indicate significant improvment in cardiovascular endurance.    Baseline 820ft    Time 6    Period Weeks    Status New                 Plan - 12/04/20 1616    Clinical Impression Statement Paitent with signficant improvement with in TUG, 5XSTS, and . Limited with measurement of 877ft, indicating decreased cardiovascular endurance and limitations in muscular endurance. Patient is improving, however FOTO does not demosntrate this, spent time educating on improving these limitations. Patient will benefit from further skilled therapy to return to prior level of function.    Personal Factors and Comorbidities Age;Behavior Pattern;Fitness;Transportation;Time since onset of injury/illness/exacerbation    Examination-Activity Limitations Toileting;Dressing;Sit;Transfers;Bed Mobility;Lift;Bend;Squat;Locomotion Level;Stairs;Carry;Stand;Reach Overhead    Examination-Participation Restrictions Yard Work;Community Activity;Shop;Driving    Stability/Clinical Decision Making Stable/Uncomplicated    Rehab Potential Good    PT Frequency 2x / week    PT Duration 8 weeks    PT Treatment/Interventions ADLs/Self Care Home Management;Cryotherapy;Electrical Stimulation;Moist Heat;DME Instruction;Gait training;Stair training;Functional mobility  training;Therapeutic activities;Therapeutic exercise;Balance training;Neuromuscular re-education;Patient/family education;Manual techniques;Passive range of motion;Dry needling;Taping;Energy conservation;Ultrasound    PT Next Visit Plan Exercises to increase R strength, Gait with SPC    PT Home Exercise Plan STS and walking with AD    Consulted and Agree with Plan of Care Patient           Patient will benefit from skilled therapeutic intervention in order to improve the following deficits and impairments:  Decreased endurance,Decreased mobility,Abnormal gait,Difficulty walking,Decreased range of motion,Obesity,Decreased activity tolerance,Decreased knowledge of  use of DME,Decreased strength,Postural dysfunction,Pain,Improper body mechanics,Hypomobility,Decreased balance,Impaired perceived functional ability,Decreased safety awareness  Visit Diagnosis: Muscle weakness (generalized)  Difficulty in walking, not elsewhere classified     Problem List Patient Active Problem List   Diagnosis Date Noted  . Protrusion of thoracic intervertebral disc 08/02/2020  . CVA (cerebral vascular accident) (HCC) 07/31/2020  . Urinary frequency 05/21/2020  . Unilateral primary osteoarthritis, left knee 03/17/2020  . Left knee pain 12/11/2019  . Swelling of lower leg 12/04/2019  . Pain and swelling of lower leg, left 12/04/2019  . Insomnia 08/28/2019  . 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 HTN (hypertension) 10/22/2015  . Type 1 diabetes (HCC) 10/22/2015  . Anemia 10/22/2015  . Restless legs 10/22/2015  . Hematuria 04/09/2015    Myrene Galas, PT DPT 12/04/2020, 4:23 PM   [facility] PHYSICAL AND SPORTS MEDICINE [address], [place], [address] Phone: [phone]   Fax:  [phone]  Name: SHEKETA ENDE MRN: [id number] Date of Birth: [birth year]

## 2020-12-09 ENCOUNTER — Ambulatory Visit: Payer: No Typology Code available for payment source

## 2020-12-10 ENCOUNTER — Other Ambulatory Visit: Payer: Self-pay

## 2020-12-10 ENCOUNTER — Telehealth: Payer: Self-pay | Admitting: Pharmacist

## 2020-12-10 ENCOUNTER — Telehealth: Payer: Self-pay | Admitting: Licensed Clinical Social Worker

## 2020-12-10 ENCOUNTER — Ambulatory Visit: Payer: Self-pay | Admitting: Licensed Clinical Social Worker

## 2020-12-10 DIAGNOSIS — F331 Major depressive disorder, recurrent, moderate: Secondary | ICD-10-CM

## 2020-12-10 DIAGNOSIS — F411 Generalized anxiety disorder: Secondary | ICD-10-CM

## 2020-12-10 NOTE — Telephone Encounter (Signed)
I informed the patient that I put in a transportation request for her eye appointment on 12/19/2020 and she will receive a call regarding the pick up time on 12/18/2020. In response to the patient request for transportation to get to the store today; I informed her that [employer] was an option.

## 2020-12-10 NOTE — Telephone Encounter (Signed)
12/10/2020 10:29:46 AM - Lantus Dose Change/refill to [facility]  -- [name] - Wednesday, December 10, 2020 10:28 AM --Faxed Lantus refill request to [facility] for Dose Change on Lantus Vials Inject 30 units under the skin daily at bedtime #3.

## 2020-12-10 NOTE — BH Specialist Note (Signed)
Integrated Behavioral Health Follow Up In-Person Visit  MRN: [id number] Name: Courtney Stafford   Total time: 60 minutes  Types of Service: General Behavioral Integrated Care (BHI) and Health & Behavioral Assessment/Intervention  Interpretor:No. Interpretor Name and Language:   Subjective: Courtney Stafford is a 50 y.o. female accompanied by herself  Patient was referred by Hurman Horn, NP  for mental health. Patient reports the following symptoms/concerns: The patient states that she is okay and is taking it day by day. The patient discussed her progress with physical therapy. The patient noted that some days she can sleep and others she finds it very difficult to fall asleep and stay asleep. The patient stated that she received a letter that she was approved for SNAP benefits; however, the next day she received a letter stating that she had to work or Agricultural consultant to qualify. The patient discussed concerns regarding losing her benefits. The patient asked for help with getting transportation to her eye appointment nest week. The patient noted that she needs some things abut has no way to get to the store. She stated that she is afraid to walk that far with her walker. The patient states that she has not walked to the store since she had a stroke. She notes that she may not have another choice since the weather may turn bad again over the weekend. The patient denied any suicidal or homicidal thoughts. Duration of problem: ; Severity of problem: moderate  Objective: Mood: Euthymic and Affect: Appropriate Risk of harm to self or others: No plan to harm self or others  Life Context: Family and Social: see above School/Work: see above Self-Care: see above Life Changes: see above  Patient and/or Family's Strengths/Protective Factors: Concrete supports in place (healthy food, safe environments, etc.)  Goals Addressed: Patient will: 1.  Reduce symptoms of: anxiety, depression and stress   2.  Increase knowledge and/or ability of: coping skills, healthy habits and stress reduction  3.  Demonstrate ability to: Increase healthy adjustment to current life circumstances  Progress towards Goals: Ongoing  Interventions: Interventions utilized:  Supportive Counseling was utilized during today's follow-up session. The clinician processed with the patient how she has been doing since her last follow-up session. Clinician measured the patient's anxiety and depression on a numerical scale. The clinician offered to call transportation services on the patient's behalf for her upcoming eye appointment. Further, clinician offered to check on additional resources for the patient and call her if she found any. Clinician encouraged the patient to focus on the positives rather than the negatives in her life and to continue to utilize coping skills to address her current life circumstances The clinician encouraged the patient to continue to work on self care and increasing her support system.  Standardized Assessments completed: GAD-7 and PHQ 9  PHQ-9    14 Gad- 7    18 Assessment: Patient currently experiencing see above  Patient may benefit from see above  Plan: 1. Follow up with behavioral health clinician on : 12/24/2020 at 9:00 AM  2. Behavioral recommendations:  3. Referral(s): Integrated [employer] (In Clinic) 4. "From scale of 1-10, how likely are you to follow plan?":   Judith Part, Student-Social Work

## 2020-12-11 ENCOUNTER — Telehealth: Payer: Self-pay | Admitting: Licensed Clinical Social Worker

## 2020-12-11 NOTE — Telephone Encounter (Signed)
Spoke to the patient and informed her that she can receive two free rides to the store per month through [employer]. I also informed her that [facility] foundation is providing her a Sales promotion account executive card.

## 2020-12-11 NOTE — Telephone Encounter (Signed)
The patient called requesting assistance with finding transportation to the store and assistance with toiletries and cleaning supplies.  I told the patient I would look for resources and call her back if I found anything.

## 2020-12-16 ENCOUNTER — Other Ambulatory Visit: Payer: Self-pay

## 2020-12-16 ENCOUNTER — Ambulatory Visit: Payer: No Typology Code available for payment source | Attending: Gerontology

## 2020-12-16 DIAGNOSIS — M25562 Pain in left knee: Secondary | ICD-10-CM | POA: Insufficient documentation

## 2020-12-16 DIAGNOSIS — M25662 Stiffness of left knee, not elsewhere classified: Secondary | ICD-10-CM | POA: Insufficient documentation

## 2020-12-16 DIAGNOSIS — M6281 Muscle weakness (generalized): Secondary | ICD-10-CM | POA: Insufficient documentation

## 2020-12-16 DIAGNOSIS — G8929 Other chronic pain: Secondary | ICD-10-CM | POA: Insufficient documentation

## 2020-12-16 DIAGNOSIS — R262 Difficulty in walking, not elsewhere classified: Secondary | ICD-10-CM | POA: Insufficient documentation

## 2020-12-16 NOTE — Therapy (Signed)
[place] [facility] PHYSICAL AND SPORTS MEDICINE [address] [place], [address] Phone: [phone]   Fax:  [phone]  Physical Therapy Treatment  Patient Details  Name: Courtney Stafford MRN: [id number] Date of Birth: 1970/12/24 Referring Provider (PT): Lewie Chamber, MD   Encounter Date: 12/16/2020   PT End of Session - 12/16/20 1603    Visit Number 17    Number of Visits 17    Date for PT Re-Evaluation 01/15/21    PT Start Time 1515    PT Stop Time 1600    PT Time Calculation (min) 45 min    Equipment Utilized During Treatment Gait belt    Activity Tolerance Patient tolerated treatment well;No increased pain    Behavior During Therapy WFL for tasks assessed/performed           Past Medical History:  Diagnosis Date  . Diabetes mellitus without complication (HCC)   . Hypertension   . Stroke [facility]) 07/31/2020    Past Surgical History:  Procedure Laterality Date  . CARPAL TUNNEL RELEASE  6 years ago   both hands    There were no vitals filed for this visit.   Subjective Assessment - 12/16/20 1503    Subjective Patient states she is having some pain in L leg (anterior shin).  She walked to and from the store this week and it took her over an hour; she states she used to do it in 15 minutes prior to the CVA and knee issue.    Pertinent History Recent CVA 07/30/20.  Left knee pain. Pt was seen by Allie Bossier in May 2021, who diagnosed knee OA questionable need for TKA in future, has recommended conservative management at this point. Pt had a cortizone injection in May (without avail) and is shceduled for a 'gel' injection on 6/28.    Limitations Walking;Lifting;Standing;House hold activities    How long can you sit comfortably? unlimited    How long can you stand comfortably? ~5 minutes    How long can you walk comfortably? ~2-3 minutes    Diagnostic tests Diagnosed CVA    Patient Stated Goals To be able to walk without the use of  an assitive device.    Currently in Pain? No/denies    Pain Onset More than a month ago            Therapeutic Exercise Sit to stands -- 2 x 10 with speed without UE support Sit to stands from 14.5" height -- x 10 Nustep for improvement in endurance -- x5 min level 6 with use of UE too  Step ups without UE support -- LRRL front step up 2x10 Stair navigation with R single rail- 2x with PT SBA  Gait training with SPC on flat surface in clinic, PT SBA with gait belt on pt; focused on using SPC in R UE and placing cane at same time L foot initially contacts ground.  She was able to achieve normal gait sequence <25% of the time; she mostly utilized step-to gait pattern with SPC and even with verbal/visual/tactile cues she did not achieve normal gait pattern using SPC consistently during session.  She would benefit from additional practice in clinic.       PT Education - 12/16/20 1603    Education Details sequencing/foot placement with use of SPC    Person(s) Educated Patient    Methods Explanation;Verbal cues    Comprehension Need further instruction  PT Short Term Goals - 08/12/20 1144      PT SHORT TERM GOAL #1   Title Patient will demonstrate independence with HEP to maximize rehab potential.    Time 4    Period Weeks    Status New    Target Date 09/09/20             PT Long Term Goals - 12/04/20 1530      PT LONG TERM GOAL #1   Title Patient will demonstrate independence with progressive HEP to maintain and progress improvements achieved during therapy.    Baseline moderate cueing with exercise; 1/20/2022minimal cueing to perform    Time 8    Period Weeks    Status On-going      PT LONG TERM GOAL #2   Title Patient will have a FOTO score of >68/100 to show an increase in independence while performing functional daily activities.    Baseline 08/12/20: 49; 10/14/2020: 52; 12/04/2020; 51    Time 8    Period Weeks    Status On-going      PT LONG TERM  GOAL #3   Title Patient will demonstrate improved facility and steadiness in ambulation while performing at a gait speed of >0.51m/s.    Baseline 08/12/20: 0.37 m/s; 10/14/2020: .75 m/s; 12/04/2020: .827 m/s    Time 8    Period Weeks    Status Achieved      PT LONG TERM GOAL #4   Title Patient will improve 5xSTS and TUG score to under 12 sec to show an increase in patient strength/power to decrease patients fall risk.    Baseline 08/12/20: 5xSTS: 21.5sec, TUG: 25.7sec; 10/14/20: 5xSTS: 12.6sec, TUG: 15.7sec; 12/05/2019: 5xSTS: 11.25 sec; TUG: 11.75 sec    Time 8    Period Weeks    Status On-going      PT LONG TERM GOAL #5   Title Patient will be able to reach up to grab items out of her cabinets with R UE at home to show an improvement in her strength and ROM to perform task independently.    Baseline 90 Degrees of Flexion due to weakness/unable to reach up to cabinet; 12/04/2020: Full AROM    Time 8    Period Weeks    Status Achieved      Additional Long Term Goals   Additional Long Term Goals Yes      PT LONG TERM GOAL #6   Title Patient will improve to over 1350 ft to indicate significant improvment in cardiovascular endurance.    Baseline 89ft    Time 6    Period Weeks    Status New                 Plan - 12/16/20 1605    Clinical Impression Statement Pt was challenged with LE strengthening exercises today; she does not report increased knee pain with these exercises though.  Focused on amb with SPC in clinic on flat surfaces.  Pt unable to consistently place [facility] with L foot initial contact, and she also demonstrates inconsistent step length and step-to pattern instead of step-through gait pattern with SPC.  PT was CGA for safety during amb.  She would benefit from more practice and training with [facility] in clinic before transitioning to independently using SPC outside of home.    Personal Factors and Comorbidities Age;Behavior Pattern;Fitness;Transportation;Time  since onset of injury/illness/exacerbation    Examination-Activity Limitations Toileting;Dressing;Sit;Transfers;Bed Mobility;Lift;Bend;Squat;Locomotion Level;Stairs;Carry;Stand;Reach Overhead    Examination-Participation Restrictions  Yard Work;Community Activity;Shop;Driving    Stability/Clinical Decision Making Stable/Uncomplicated    Rehab Potential Good    PT Frequency 2x / week    PT Duration 8 weeks    PT Treatment/Interventions ADLs/Self Care Home Management;Cryotherapy;Electrical Stimulation;Moist Heat;DME Instruction;Gait training;Stair training;Functional mobility training;Therapeutic activities;Therapeutic exercise;Balance training;Neuromuscular re-education;Patient/family education;Manual techniques;Passive range of motion;Dry needling;Taping;Energy conservation;Ultrasound    PT Next Visit Plan Exercises to increase R strength, Gait with SPC    PT Home Exercise Plan STS and walking with AD    Consulted and Agree with Plan of Care Patient           Patient will benefit from skilled therapeutic intervention in order to improve the following deficits and impairments:  Decreased endurance,Decreased mobility,Abnormal gait,Difficulty walking,Decreased range of motion,Obesity,Decreased activity tolerance,Decreased knowledge of use of DME,Decreased strength,Postural dysfunction,Pain,Improper body mechanics,Hypomobility,Decreased balance,Impaired perceived functional ability,Decreased safety awareness  Visit Diagnosis: Muscle weakness (generalized)  Difficulty in walking, not elsewhere classified  Chronic pain of left knee  Stiffness of left knee, not elsewhere classified     Problem List Patient Active Problem List   Diagnosis Date Noted  . Protrusion of thoracic intervertebral disc 08/02/2020  . CVA (cerebral vascular accident) (HCC) 07/31/2020  . Urinary frequency 05/21/2020  . Unilateral primary osteoarthritis, left knee 03/17/2020  . Left knee pain 12/11/2019  . Swelling  of lower leg 12/04/2019  . Pain and swelling of lower leg, left 12/04/2019  . Insomnia 08/28/2019  . 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 HTN (hypertension) 10/22/2015  . Type 1 diabetes (HCC) 10/22/2015  . Anemia 10/22/2015  . Restless legs 10/22/2015  . Hematuria 04/09/2015    Ardine Bjork 12/16/2020, 4:16 PM Max Fickle, PT, DPT  [place] [facility] PHYSICAL AND SPORTS MEDICINE [address] [place], [address] Phone: [phone]   Fax:  [phone]  Name: NARJIS MIRA MRN: [id number] Date of Birth: April 22, 1971

## 2020-12-18 ENCOUNTER — Ambulatory Visit: Payer: No Typology Code available for payment source

## 2020-12-23 ENCOUNTER — Ambulatory Visit: Payer: No Typology Code available for payment source

## 2020-12-23 ENCOUNTER — Other Ambulatory Visit: Payer: Self-pay

## 2020-12-23 DIAGNOSIS — G8929 Other chronic pain: Secondary | ICD-10-CM

## 2020-12-23 DIAGNOSIS — M25562 Pain in left knee: Secondary | ICD-10-CM

## 2020-12-23 DIAGNOSIS — M6281 Muscle weakness (generalized): Secondary | ICD-10-CM

## 2020-12-23 DIAGNOSIS — M25662 Stiffness of left knee, not elsewhere classified: Secondary | ICD-10-CM

## 2020-12-23 DIAGNOSIS — R262 Difficulty in walking, not elsewhere classified: Secondary | ICD-10-CM

## 2020-12-23 NOTE — Therapy (Signed)
[place] [facility]IC[facility] [address] [place], [address] Phone: [phone]   Fax:  [phone]  Physical Therapy Treatment  Patient Details  Name: Courtney Stafford MRN: [id number] Date of Birth: 25-Oct-1971 Referring Provider (PT): Lewie Chamber, MD   Encounter Date: 12/23/2020   PT End of Session - 12/23/20 1207    Visit Number 18    Number of Visits 18    Date for PT Re-Evaluation 01/15/21    PT Start Time 1115    PT Stop Time 1200    PT Time Calculation (min) 45 min    Equipment Utilized During Treatment Gait belt    Activity Tolerance Patient tolerated treatment well;No increased pain    Behavior During Therapy WFL for tasks assessed/performed           Past Medical History:  Diagnosis Date  . Diabetes mellitus without complication (HCC)   . Hypertension   . Stroke [facility]) 07/31/2020    Past Surgical History:  Procedure Laterality Date  . CARPAL TUNNEL RELEASE  6 years ago   both hands    There were no vitals filed for this visit.   Subjective Assessment - 12/23/20 1121    Subjective Patient reports she has been practicing at home with the [facility]. No increase in pain in L leg or knee.    Pertinent History Recent CVA 07/30/20.  Left knee pain. Pt was seen by Allie Bossier in May 2021, who diagnosed knee OA questionable need for TKA in future, has recommended conservative management at this point. Pt had a cortizone injection in May (without avail) and is shceduled for a 'gel' injection on 6/28.    Limitations Walking;Lifting;Standing;House hold activities    How long can you sit comfortably? unlimited    How long can you stand comfortably? ~5 minutes    How long can you walk comfortably? ~2-3 minutes    Diagnostic tests Diagnosed CVA    Patient Stated Goals To be able to walk without the use of an assitive device.    Currently in Pain? No/denies    Pain Onset More than a month ago             Sit to  stands from 13" height (grey chair +4in step)-- 2x 5 Nustep for improvement in endurance -- x5 min level 6 with use of UE too   Step ups without UE support -- LRRL front step up 2x10( 4in step + airex)  Stair navigation with R single rail- 2x with PT SBA    Gait training with SPC on flat surface in clinic, PT SBA with gait belt on pt; focused on using SPC in R UE and placing cane at same time L foot initially contacts ground.  She was able to achieve normal gait sequence <60% of the time; she mostly utilized step-to gait pattern with SPC and even with verbal/visual/tactile cues she did not achieve normal gait pattern using SPC consistently during session.  She would benefit from additional practice in clinic.             PT Education - 12/23/20 1122    Education Details form/technique with exercise, sequencing with use of SPC    Person(s) Educated Patient    Methods Explanation;Demonstration    Comprehension Verbalized understanding;Returned demonstration;Need further instruction            PT Short Term Goals - 08/12/20 1144      PT SHORT TERM GOAL #  1   Title Patient will demonstrate independence with HEP to maximize rehab potential.    Time 4    Period Weeks    Status New    Target Date 09/09/20             PT Long Term Goals - 12/04/20 1530      PT LONG TERM GOAL #1   Title Patient will demonstrate independence with progressive HEP to maintain and progress improvements achieved during therapy.    Baseline moderate cueing with exercise; 1/20/2022minimal cueing to perform    Time 8    Period Weeks    Status On-going      PT LONG TERM GOAL #2   Title Patient will have a FOTO score of >68/100 to show an increase in independence while performing functional daily activities.    Baseline 08/12/20: 49; 10/14/2020: 52; 12/04/2020; 51    Time 8    Period Weeks    Status On-going      PT LONG TERM GOAL #3   Title Patient will demonstrate improved facility and steadiness  in ambulation while performing at a gait speed of >0.55m/s.    Baseline 08/12/20: 0.37 m/s; 10/14/2020: .75 m/s; 12/04/2020: .827 m/s    Time 8    Period Weeks    Status Achieved      PT LONG TERM GOAL #4   Title Patient will improve 5xSTS and TUG score to under 12 sec to show an increase in patient strength/power to decrease patients fall risk.    Baseline 08/12/20: 5xSTS: 21.5sec, TUG: 25.7sec; 10/14/20: 5xSTS: 12.6sec, TUG: 15.7sec; 12/05/2019: 5xSTS: 11.25 sec; TUG: 11.75 sec    Time 8    Period Weeks    Status On-going      PT LONG TERM GOAL #5   Title Patient will be able to reach up to grab items out of her cabinets with R UE at home to show an improvement in her strength and ROM to perform task independently.    Baseline 90 Degrees of Flexion due to weakness/unable to reach up to cabinet; 12/04/2020: Full AROM    Time 8    Period Weeks    Status Achieved      Additional Long Term Goals   Additional Long Term Goals Yes      PT LONG TERM GOAL #6   Title Patient will improve to over 1350 ft to indicate significant improvment in cardiovascular endurance.    Baseline 825ft    Time 6    Period Weeks    Status New                 Plan - 12/23/20 1204    Clinical Impression Statement Patient performed LE strengthening exercises with no increase in pain. Lowering the seat height to 13" from 14.5" increased the difficulty for the patient. Pt improved use of SPC to consistently being able to perfomr a step through gate pattern. Pt does not appear to be stable or be able to navigate uneven terrain with just use of SPC at this time. Patient will benefit from skiller therapy to return to prior level of function.    Personal Factors and Comorbidities Age;Behavior Pattern;Fitness;Transportation;Time since onset of injury/illness/exacerbation    Examination-Activity Limitations Toileting;Dressing;Sit;Transfers;Bed Mobility;Lift;Bend;Squat;Locomotion  Level;Stairs;Carry;Stand;Reach Overhead    Examination-Participation Restrictions Yard Work;Community Activity;Shop;Driving    Stability/Clinical Decision Making Stable/Uncomplicated    Rehab Potential Good    PT Frequency 2x / week    PT Duration 8  weeks    PT Treatment/Interventions ADLs/Self Care Home Management;Cryotherapy;Electrical Stimulation;Moist Heat;DME Instruction;Gait training;Stair training;Functional mobility training;Therapeutic activities;Therapeutic exercise;Balance training;Neuromuscular re-education;Patient/family education;Manual techniques;Passive range of motion;Dry needling;Taping;Energy conservation;Ultrasound    PT Next Visit Plan Exercises to increase R strength, Gait with SPC    PT Home Exercise Plan STS and walking with AD    Consulted and Agree with Plan of Care Patient           Patient will benefit from skilled therapeutic intervention in order to improve the following deficits and impairments:  Decreased endurance,Decreased mobility,Abnormal gait,Difficulty walking,Decreased range of motion,Obesity,Decreased activity tolerance,Decreased knowledge of use of DME,Decreased strength,Postural dysfunction,Pain,Improper body mechanics,Hypomobility,Decreased balance,Impaired perceived functional ability,Decreased safety awareness  Visit Diagnosis: Muscle weakness (generalized)  Difficulty in walking, not elsewhere classified  Chronic pain of left knee  Stiffness of left knee, not elsewhere classified     Problem List Patient Active Problem List   Diagnosis Date Noted  . Protrusion of thoracic intervertebral disc 08/02/2020  . CVA (cerebral vascular accident) (HCC) 07/31/2020  . Urinary frequency 05/21/2020  . Unilateral primary osteoarthritis, left knee 03/17/2020  . Left knee pain 12/11/2019  . Swelling of lower leg 12/04/2019  . Pain and swelling of lower leg, left 12/04/2019  . Insomnia 08/28/2019  . 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 HTN (hypertension) 10/22/2015  . Type 1 diabetes (HCC) 10/22/2015  . Anemia 10/22/2015  . Restless legs 10/22/2015  . Hematuria 04/09/2015    Silvano Rusk SPT 12/23/2020, 12:08 PM  [place] [facility] PH[facility]TS MEDICINE [address] [place], [address] Phone: [phone]   Fax:  [phone]  Name: Courtney Stafford MRN: [id number] Date of Birth: [birth year]

## 2020-12-24 ENCOUNTER — Ambulatory Visit: Payer: Self-pay | Admitting: Licensed Clinical Social Worker

## 2020-12-24 DIAGNOSIS — F331 Major depressive disorder, recurrent, moderate: Secondary | ICD-10-CM

## 2020-12-24 DIAGNOSIS — F411 Generalized anxiety disorder: Secondary | ICD-10-CM

## 2020-12-24 NOTE — BH Specialist Note (Signed)
Integrated Behavioral Health Follow Up In-Person Visit  MRN: [id number] Name: Courtney Stafford  Total time: 60 minutes  Types of Service: General Behavioral Integrated Care (BHI)  Interpretor:No. Interpretor Name and Language: N/A.  Patient consents to telephone visit and 2 patient identifiers were used to identify patient.  Subjective: Courtney Stafford is a 50 y.o. female accompanied by herself  Patient was referred by Hurman Horn, NP  for mental health. Patient reports the following symptoms/concerns: The patient reports that she did receive a [employer] card in the mail for $150.00 from the [facility]. She notes that she was able to buy the cleaning and toiletry supplies she needed. She stated that [employer] was able to provide her a ride to the store. The patient noted that she did not feel well last week because her sugar dropped so she rescheduled her eye appointment. The patient requested transportation through [facility] for her eye appointment and for her appointment with Hurman Horn, NP.  The patient notes that everything else is about the same. She states that she is worrying a lot and does not feel loved by anyone. The patient stated," I feel like nothing no more for the past few weeks." The patient stated, " I have my good days and bad days." The patient endorsed having suicidal thoughts over the last few weeks; she denied having a plan or access to means to carry out a plan. The patient denied having access to firearms. The patient repeated the contact information for [facility]  Crisis Line and stated that she was aware that she could call 911 or go to the local Emergency Department if her suicidal thoughts worsened. The patient denied homicidal thoughts.  Duration of problem: ; Severity of problem: moderate  Objective: Mood: Depressed and Euthymic and Affect: Appropriate Risk of harm to self or others: No plan to  harm self or others  Life Context: Family and Social: see above School/Work: see above Self-Care: see above Life Changes: see above  Patient and/or Family's Strengths/Protective Factors: Concrete supports in place (healthy food, safe environments, etc.)  Goals Addressed: Patient will: 1.  Reduce symptoms of: anxiety and depression  2.  Increase knowledge and/or ability of: coping skills, healthy habits and self-management skills  3.  Demonstrate ability to: Increase healthy adjustment to current life circumstances  Progress towards Goals: Ongoing  Interventions: Interventions utilized:  Supportive Counseling was utilized by the clinician during today's follow up session. The clinician processed with the patient how they have been doing since the last follow-up session. The clinician provided a space for the patient to ventilate their frustrations regarding their current life circumstances. Clinician measured the patient's anxiety and depression on a numerical scale. The clinician encouraged the patient to utilize their coping skills to deal with their current life circumstances. Clinician discussed with the patient how since last year she has become more isolated and explored ways to increase her social supports. Clinician provided the patient with [facility] crisis line and information on how to access help should the patient have worsening suicidal thoughts. Clinician offered to put in transportation requests on the patient's behalf. Clinician reviewed the patients psychotropic medication adherence with the patient and discussed the importance of taking the medication as prescribed at the same time each day for it to reach it's full intended effect.  Standardized Assessments completed: GAD-7 and PHQ 9  Gad-7    14 PHQ-9   20   Assessment: Patient currently  experiencing see above   Patient may benefit from see above  Plan: 1. Follow up with behavioral health clinician on : 01/07/2021 at  12:00 pm. 2. Behavioral recommendations:  3. Referral(s): [facility] [employer] (In Clinic) 4. "From scale of 1-10, how likely are you to follow plan?":   Judith Part, Student-Social Work

## 2020-12-25 ENCOUNTER — Ambulatory Visit: Payer: No Typology Code available for payment source

## 2020-12-30 ENCOUNTER — Other Ambulatory Visit: Payer: Self-pay

## 2020-12-30 ENCOUNTER — Ambulatory Visit: Payer: No Typology Code available for payment source

## 2020-12-30 DIAGNOSIS — G8929 Other chronic pain: Secondary | ICD-10-CM

## 2020-12-30 DIAGNOSIS — M25562 Pain in left knee: Secondary | ICD-10-CM

## 2020-12-30 DIAGNOSIS — R262 Difficulty in walking, not elsewhere classified: Secondary | ICD-10-CM

## 2020-12-30 DIAGNOSIS — M6281 Muscle weakness (generalized): Secondary | ICD-10-CM

## 2020-12-30 DIAGNOSIS — M25662 Stiffness of left knee, not elsewhere classified: Secondary | ICD-10-CM

## 2020-12-30 NOTE — Therapy (Signed)
[facility] [address], [place], [address] Phone: [phone]   Fax:  [phone]  Physical Therapy Treatment  Patient Details  Name: Courtney Stafford MRN: [id number] Date of Birth: Mar 25, 1971 Referring Provider (PT): Lewie Chamber, MD   Encounter Date: 12/30/2020   PT End of Session - 12/30/20 1127    Visit Number 19    Number of Visits 19    Date for PT Re-Evaluation 01/15/21    PT Start Time 1030    PT Stop Time 1115    PT Time Calculation (min) 45 min    Equipment Utilized During Treatment Gait belt    Activity Tolerance Patient tolerated treatment well;No increased pain    Behavior During Therapy WFL for tasks assessed/performed           Past Medical History:  Diagnosis Date  . Diabetes mellitus without complication (HCC)   . Hypertension   . Stroke [facility]) 07/31/2020    Past Surgical History:  Procedure Laterality Date  . CARPAL TUNNEL RELEASE  6 years ago   both hands    There were no vitals filed for this visit.   Subjective Assessment - 12/30/20 1125    Subjective Patient reports practicing with the cane at home. Feels more comfortable using the cane before the session today.    Pertinent History Recent CVA 07/30/20.  Left knee pain. Pt was seen by Allie Bossier in May 2021, who diagnosed knee OA questionable need for TKA in future, has recommended conservative management at this point. Pt had a cortizone injection in May (without avail) and is shceduled for a 'gel' injection on 6/28.    Limitations Walking;Lifting;Standing;House hold activities    How long can you sit comfortably? unlimited    How long can you stand comfortably? ~5 minutes    How long can you walk comfortably? ~2-3 minutes    Diagnostic tests Diagnosed CVA    Patient Stated Goals To be able to walk without the use of an assitive device.    Currently in Pain? No/denies    Pain Onset More than a month ago             Therapeutic exercise  Nustep for improvement in endurance -- x5 min level 6 with use of UE too Step ups without UE support -- LRRL front step up 2x10( 6in step)  Cone taps with foot- 4x6 taps    Gait training with SPC on flat surface in clinic as well as navigating around obstacles, over cones, and over airex, and outdoors PT SBA with gait belt on pt;  She was able to achieve normal gait sequence <80% of the time; she mostly utilized step-through pattern with SPC.  She would benefit from additional practice in clinic.              PT Education - 12/30/20 1122    Education Details form/technique with exercise, SPC on unstable surfaces    Person(s) Educated Patient    Methods Explanation;Demonstration    Comprehension Verbalized understanding;Returned demonstration            PT Short Term Goals - 08/12/20 1144      PT SHORT TERM GOAL #1   Title Patient will demonstrate independence with HEP to maximize rehab potential.    Time 4    Period Weeks    Status New    Target Date 09/09/20  PT Long Term Goals - 12/04/20 1530      PT LONG TERM GOAL #1   Title Patient will demonstrate independence with progressive HEP to maintain and progress improvements achieved during therapy.    Baseline moderate cueing with exercise; 1/20/2022minimal cueing to perform    Time 8    Period Weeks    Status On-going      PT LONG TERM GOAL #2   Title Patient will have a FOTO score of >68/100 to show an increase in independence while performing functional daily activities.    Baseline 08/12/20: 49; 10/14/2020: 52; 12/04/2020; 51    Time 8    Period Weeks    Status On-going      PT LONG TERM GOAL #3   Title Patient will demonstrate improved facility and steadiness in ambulation while performing at a gait speed of >0.65m/s.    Baseline 08/12/20: 0.37 m/s; 10/14/2020: .75 m/s; 12/04/2020: .827 m/s    Time 8    Period Weeks    Status Achieved      PT LONG TERM  GOAL #4   Title Patient will improve 5xSTS and TUG score to under 12 sec to show an increase in patient strength/power to decrease patients fall risk.    Baseline 08/12/20: 5xSTS: 21.5sec, TUG: 25.7sec; 10/14/20: 5xSTS: 12.6sec, TUG: 15.7sec; 12/05/2019: 5xSTS: 11.25 sec; TUG: 11.75 sec    Time 8    Period Weeks    Status On-going      PT LONG TERM GOAL #5   Title Patient will be able to reach up to grab items out of her cabinets with R UE at home to show an improvement in her strength and ROM to perform task independently.    Baseline 90 Degrees of Flexion due to weakness/unable to reach up to cabinet; 12/04/2020: Full AROM    Time 8    Period Weeks    Status Achieved      Additional Long Term Goals   Additional Long Term Goals Yes      PT LONG TERM GOAL #6   Title Patient will improve to over 1350 ft to indicate significant improvment in cardiovascular endurance.    Baseline 875ft    Time 6    Period Weeks    Status New                 Plan - 12/30/20 1122    Clinical Impression Statement Patient was able to successfully use SPC to navigate uneven surfaces, turns, curbs, and hills. Patient performed exercises to focus on strengthening without an increase in pain in LE. Dynamic balance without the use of an assitive device is still challenging. Patient will benefit from further skilled therapy to return to prior level of function.    Personal Factors and Comorbidities Age;Behavior Pattern;Fitness;Transportation;Time since onset of injury/illness/exacerbation    Examination-Activity Limitations Toileting;Dressing;Sit;Transfers;Bed Mobility;Lift;Bend;Squat;Locomotion Level;Stairs;Carry;Stand;Reach Overhead    Examination-Participation Restrictions Yard Work;Community Activity;Shop;Driving    Stability/Clinical Decision Making Stable/Uncomplicated    Rehab Potential Good    PT Frequency 2x / week    PT Duration 8 weeks    PT Treatment/Interventions ADLs/Self Care Home  Management;Cryotherapy;Electrical Stimulation;Moist Heat;DME Instruction;Gait training;Stair training;Functional mobility training;Therapeutic activities;Therapeutic exercise;Balance training;Neuromuscular re-education;Patient/family education;Manual techniques;Passive range of motion;Dry needling;Taping;Energy conservation;Ultrasound    PT Next Visit Plan Exercises to increase R strength, Gait with SPC    PT Home Exercise Plan STS and walking with AD    Consulted and Agree with Plan of Care Patient  Patient will benefit from skilled therapeutic intervention in order to improve the following deficits and impairments:  Decreased endurance,Decreased mobility,Abnormal gait,Difficulty walking,Decreased range of motion,Obesity,Decreased activity tolerance,Decreased knowledge of use of DME,Decreased strength,Postural dysfunction,Pain,Improper body mechanics,Hypomobility,Decreased balance,Impaired perceived functional ability,Decreased safety awareness  Visit Diagnosis: No diagnosis found.     Problem List Patient Active Problem List   Diagnosis Date Noted  . Protrusion of thoracic intervertebral disc 08/02/2020  . CVA (cerebral vascular accident) (HCC) 07/31/2020  . Urinary frequency 05/21/2020  . Unilateral primary osteoarthritis, left knee 03/17/2020  . Left knee pain 12/11/2019  . Swelling of lower leg 12/04/2019  . Pain and swelling of lower leg, left 12/04/2019  . Insomnia 08/28/2019  . 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 HTN (hypertension) 10/22/2015  . Type 1 diabetes (HCC) 10/22/2015  . Anemia 10/22/2015  . Restless legs 10/22/2015  . Hematuria 04/09/2015    Silvano Rusk SPT 12/30/2020, 11:28 AM  [place] [facility] [address] [place], [address] Phone: [phone]   Fax:  [phone]  Name: JOLITA HAEFNER MRN: [id number] Date of Birth:  04/07/1971

## 2021-01-06 ENCOUNTER — Other Ambulatory Visit: Payer: Self-pay

## 2021-01-06 ENCOUNTER — Ambulatory Visit: Payer: Self-pay

## 2021-01-06 DIAGNOSIS — M6281 Muscle weakness (generalized): Secondary | ICD-10-CM

## 2021-01-06 DIAGNOSIS — G8929 Other chronic pain: Secondary | ICD-10-CM

## 2021-01-06 DIAGNOSIS — R262 Difficulty in walking, not elsewhere classified: Secondary | ICD-10-CM

## 2021-01-06 DIAGNOSIS — M25662 Stiffness of left knee, not elsewhere classified: Secondary | ICD-10-CM

## 2021-01-07 ENCOUNTER — Ambulatory Visit: Payer: Self-pay | Admitting: Licensed Clinical Social Worker

## 2021-01-07 DIAGNOSIS — F411 Generalized anxiety disorder: Secondary | ICD-10-CM

## 2021-01-07 DIAGNOSIS — F331 Major depressive disorder, recurrent, moderate: Secondary | ICD-10-CM

## 2021-01-07 NOTE — BH Specialist Note (Signed)
Integrated Behavioral Health Follow Up In-Person Visit  MRN: [id number] Name: Courtney Stafford  Total time: 60 minutes  Types of Service: General Behavioral Integrated Care (BHI)  Interpretor:No. Interpretor Name and Language:   Patient consents to telephone visit and 2 patient identifiers were used to identify patient  Subjective: Courtney Stafford is a 50 y.o. female accompanied by herself Patient was referred by Hurman Horn, NP for mental health Patient reports the following symptoms/concerns: The patient stated that she is,  "doing okay and is continuing to have good days and bad days." She states that she feels this is normal for her. She explained that she is frustrated with trying to call the [facility] because nobody will answer the phone. She stated that she called a multiple times on different days and can not get an answer. She discussed other stressors  Regarding her healthcare. The patient noted that she needed transportation for her upcoming appointments to the clinic and asked if clinician would arrange that for her. The patient notes that she is continuing to go to physical therapy, but feels that her progress has slowed down some. She asked if Duloxetine is the same thing as Cymbalta. She stated that she found a bottle but didn't want to take it in case it was different. The patient asked what the hydrochlorothiazide was used for and if she needed it.The patient denied any suicidal of homicidal thoughts.  Duration of problem: ; Severity of problem: moderate  Objective: Mood: Euthymic and Affect: Appropriate Risk of harm to self or others: No plan to harm self or others  Life Context: Family and Social: see above School/Work: see above Self-Care: see above Life Changes: see above  Patient and/or Family's Strengths/Protective Factors: Concrete supports in place (healthy food, safe environments, etc.)  Goals Addressed: Patient will: 1.  Reduce symptoms of:  agitation, anxiety, depression and stress  2.  Increase knowledge and/or ability of: coping skills, healthy habits and self-management skills  3.  Demonstrate ability to: Increase healthy adjustment to current life circumstances  Progress towards Goals: Ongoing  Interventions: Interventions utilized:  Supportive Counseling was utilized by the clinician during today's follow up session. The clinician processed with the patient how they have been doing since the last follow-up session. The clinician provided a space for the patient to ventilate their frustrations regarding their current life circumstances. Clinician measured the patient's anxiety and depression on a numerical scale. Clinician encouraged the patient to reach out to their primary care provider or pharmacist regarding their question about their prescription for hydrochlorothiazide. Clinician encouraged the patient to bring all her medications with her to her upcoming appointment with her primary provider. Clinician offered to put in a transportation request for the patient's upcoming appointments. The clinician encouraged the patient to utilize their coping skills to deal with their current life circumstances.  Standardized Assessments completed: GAD-7 and PHQ 9  GAD-7   13 PHQ-9   16  Assessment: Patient currently experiencing see above  Patient may benefit from see above  Plan: 1. Follow up with behavioral health clinician on : 01/28/2021 at 3:00 PM  2. Behavioral recommendations:  3. Referral(s): Integrated [employer] (In Clinic) 4. "From scale of 1-10, how likely are you to follow plan?":   Judith Part, Student-Social Work

## 2021-01-07 NOTE — Therapy (Signed)
[facility] [address] [place], [address] Phone: [phone]   Fax:  [phone]  Physical Therapy Treatment  Patient Details  Name: Courtney Stafford MRN: [id number] Date of Birth: [birth year] Referring Provider (PT): Lewie Chamber, MD   Encounter Date: 01/06/2021   PT End of Session - 01/07/21 0911    Visit Number 20    Number of Visits 20    Date for PT Re-Evaluation 01/15/21    PT Start Time 1115    PT Stop Time 1200    PT Time Calculation (min) 45 min    Equipment Utilized During Treatment Gait belt    Activity Tolerance Patient tolerated treatment well;No increased pain    Behavior During Therapy WFL for tasks assessed/performed           Past Medical History:  Diagnosis Date  . Diabetes mellitus without complication (HCC)   . Hypertension   . Stroke [facility]) 07/31/2020    Past Surgical History:  Procedure Laterality Date  . CARPAL TUNNEL RELEASE  6 years ago   both hands    There were no vitals filed for this visit.   Subjective Assessment - 01/06/21 1131    Subjective Patient states she was able to walk to the store x 2. Patient states it took 1 hour to perform.    Pertinent History Recent CVA 07/30/20.  Left knee pain. Pt was seen by Allie Bossier in May 2021, who diagnosed knee OA questionable need for TKA in future, has recommended conservative management at this point. Pt had a cortizone injection in May (without avail) and is shceduled for a 'gel' injection on 6/28.    Limitations Walking;Lifting;Standing;House hold activities    How long can you sit comfortably? unlimited    How long can you stand comfortably? ~5 minutes    How long can you walk comfortably? ~2-3 minutes    Diagnostic tests Diagnosed CVA    Patient Stated Goals To be able to walk without the use of an assitive device.    Currently in Pain? No/denies    Pain Onset More than a month ago            TREATMENT Therapeutic Exercise  Sit to stands from 16" chair - 3 x 6 18# (2x 9# dbs) Nustep for improvement in endurance -- x5 min level 6 with use of UE too Step ups without UE support -- LRRL front step up 2x10( 6in step)  Cone pick ups - 2 x 10 with cone laying on their side Monster Walks with RTB around the ankle --    Gait training with SPC on flat surface in clinic, PT SBA with gait belt on pt; focused on using SPC in R UE and placing cane at same time L foot initially contacts ground.  She was able to achieve normal gait sequence <60% of the time; she mostly utilized step-through pattern with [facility] and even with little required verbal/visual/tactile cues   She would benefit from additional practice in clinic.      PT Education - 01/07/21 0911    Education Details form/technique with exercise    Person(s) Educated Patient    Methods Explanation;Demonstration    Comprehension Verbalized understanding;Returned demonstration            PT Short Term Goals - 08/12/20 1144      PT SHORT TERM GOAL #1   Title Patient will demonstrate independence with HEP to maximize rehab  potential.    Time 4    Period Weeks    Status New    Target Date 09/09/20             PT Long Term Goals - 12/04/20 1530      PT LONG TERM GOAL #1   Title Patient will demonstrate independence with progressive HEP to maintain and progress improvements achieved during therapy.    Baseline moderate cueing with exercise; 1/20/2022minimal cueing to perform    Time 8    Period Weeks    Status On-going      PT LONG TERM GOAL #2   Title Patient will have a FOTO score of >68/100 to show an increase in independence while performing functional daily activities.    Baseline 08/12/20: 49; 10/14/2020: 52; 12/04/2020; 51    Time 8    Period Weeks    Status On-going      PT LONG TERM GOAL #3   Title Patient will demonstrate improved facility and steadiness in ambulation while performing at a gait speed  of >0.59m/s.    Baseline 08/12/20: 0.37 m/s; 10/14/2020: .75 m/s; 12/04/2020: .827 m/s    Time 8    Period Weeks    Status Achieved      PT LONG TERM GOAL #4   Title Patient will improve 5xSTS and TUG score to under 12 sec to show an increase in patient strength/power to decrease patients fall risk.    Baseline 08/12/20: 5xSTS: 21.5sec, TUG: 25.7sec; 10/14/20: 5xSTS: 12.6sec, TUG: 15.7sec; 12/05/2019: 5xSTS: 11.25 sec; TUG: 11.75 sec    Time 8    Period Weeks    Status On-going      PT LONG TERM GOAL #5   Title Patient will be able to reach up to grab items out of her cabinets with R UE at home to show an improvement in her strength and ROM to perform task independently.    Baseline 90 Degrees of Flexion due to weakness/unable to reach up to cabinet; 12/04/2020: Full AROM    Time 8    Period Weeks    Status Achieved      Additional Long Term Goals   Additional Long Term Goals Yes      PT LONG TERM GOAL #6   Title Patient will improve to over 1350 ft to indicate significant improvment in cardiovascular endurance.    Baseline 826ft    Time 6    Period Weeks    Status New                 Plan - 01/07/21 0912    Clinical Impression Statement Ambulated ramped surfaces today, no LOB noted however patient performs slowly and with greater guarding behavior. This was improved with reprtitions performed and focus on improving speed indicating an improvement in confidence and a decreased dynamic balance within narrow BOS. Patient is improving and will benefit from further skilled therapy to return to prior level of function.    Personal Factors and Comorbidities Age;Behavior Pattern;Fitness;Transportation;Time since onset of injury/illness/exacerbation    Examination-Activity Limitations Toileting;Dressing;Sit;Transfers;Bed Mobility;Lift;Bend;Squat;Locomotion Level;Stairs;Carry;Stand;Reach Overhead    Examination-Participation Restrictions Yard Work;Community Activity;Shop;Driving     Stability/Clinical Decision Making Stable/Uncomplicated    Rehab Potential Good    PT Frequency 2x / week    PT Duration 8 weeks    PT Treatment/Interventions ADLs/Self Care Home Management;Cryotherapy;Electrical Stimulation;Moist Heat;DME Instruction;Gait training;Stair training;Functional mobility training;Therapeutic activities;Therapeutic exercise;Balance training;Neuromuscular re-education;Patient/family education;Manual techniques;Passive range of motion;Dry needling;Taping;Energy conservation;Ultrasound  PT Next Visit Plan Exercises to increase R strength, Gait with SPC    PT Home Exercise Plan STS and walking with AD    Consulted and Agree with Plan of Care Patient           Patient will benefit from skilled therapeutic intervention in order to improve the following deficits and impairments:  Decreased endurance,Decreased mobility,Abnormal gait,Difficulty walking,Decreased range of motion,Obesity,Decreased activity tolerance,Decreased knowledge of use of DME,Decreased strength,Postural dysfunction,Pain,Improper body mechanics,Hypomobility,Decreased balance,Impaired perceived functional ability,Decreased safety awareness  Visit Diagnosis: Muscle weakness (generalized)  Difficulty in walking, not elsewhere classified  Chronic pain of left knee  Stiffness of left knee, not elsewhere classified     Problem List Patient Active Problem List   Diagnosis Date Noted  . Protrusion of thoracic intervertebral disc 08/02/2020  . CVA (cerebral vascular accident) (HCC) 07/31/2020  . Urinary frequency 05/21/2020  . Unilateral primary osteoarthritis, left knee 03/17/2020  . Left knee pain 12/11/2019  . Swelling of lower leg 12/04/2019  . Pain and swelling of lower leg, left 12/04/2019  . Insomnia 08/28/2019  . 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 HTN (hypertension) 10/22/2015  . Type 1 diabetes (HCC) 10/22/2015  . Anemia 10/22/2015   . Restless legs 10/22/2015  . Hematuria 04/09/2015    Myrene Galas 01/07/2021, 9:35 AM  [place] [facility] SPORTS [facility]IC[facility] [address], [place], [address] Phone: [phone]   Fax:  [phone]  Name: TNIYA BOWDITCH MRN: [id number] Date of Birth: [birth year]

## 2021-01-12 ENCOUNTER — Telehealth: Payer: Self-pay | Admitting: Pharmacist

## 2021-01-12 NOTE — Telephone Encounter (Signed)
01/12/2021 4:03:46 PM - [employer] paperwork for [employer] to pt & dr  -- [name] - Monday, January 12, 2021 4:02 PM --Patient must renew for [employer] Courtney Stafford 01/25/2021--Mailing patient her portion to sign and return with all financials needed for renewal-form to provider to sign.

## 2021-01-14 ENCOUNTER — Other Ambulatory Visit: Payer: Self-pay

## 2021-01-14 DIAGNOSIS — I10 Essential (primary) hypertension: Secondary | ICD-10-CM

## 2021-01-14 DIAGNOSIS — E109 Type 1 diabetes mellitus without complications: Secondary | ICD-10-CM

## 2021-01-15 ENCOUNTER — Ambulatory Visit: Payer: No Typology Code available for payment source

## 2021-01-15 LAB — HEMOGLOBIN A1C
Est. average glucose Bld gHb Est-mCnc: 189 mg/dL
Hgb A1c MFr Bld: 8.2 % — ABNORMAL HIGH (ref 4.8–5.6)

## 2021-01-15 LAB — COMPREHENSIVE METABOLIC PANEL
ALT: 12 IU/L (ref 0–32)
AST: 18 IU/L (ref 0–40)
Albumin/Globulin Ratio: 1.6 (ref 1.2–2.2)
Albumin: 4.5 g/dL (ref 3.8–4.9)
Alkaline Phosphatase: 133 IU/L — ABNORMAL HIGH (ref 44–121)
BUN/Creatinine Ratio: 11 (ref 9–23)
BUN: 10 mg/dL (ref 6–24)
Bilirubin Total: 0.5 mg/dL (ref 0.0–1.2)
CO2: 22 mmol/L (ref 20–29)
Calcium: 9.6 mg/dL (ref 8.7–10.2)
Chloride: 100 mmol/L (ref 96–106)
Creatinine, Ser: 0.94 mg/dL (ref 0.57–1.00)
Globulin, Total: 2.9 g/dL (ref 1.5–4.5)
Glucose: 155 mg/dL — ABNORMAL HIGH (ref 65–99)
Potassium: 3.9 mmol/L (ref 3.5–5.2)
Sodium: 142 mmol/L (ref 134–144)
Total Protein: 7.4 g/dL (ref 6.0–8.5)
eGFR: 73 mL/min/{1.73_m2} (ref >59)

## 2021-01-16 ENCOUNTER — Telehealth: Payer: Self-pay | Admitting: Pharmacy Technician

## 2021-01-16 NOTE — Telephone Encounter (Signed)
Patient requesting assistance with completion of paperwork for re-certification.  Patient requested transportation assistance.  Transportation arranged by Thayer Ohm in [employer].  Patient scheduled for eligibility appointment on 01/23/21 at 2:00p.m.Marland Kitchen  Sherilyn Dacosta Care Manager [facility]

## 2021-01-20 ENCOUNTER — Other Ambulatory Visit: Payer: Self-pay

## 2021-01-20 ENCOUNTER — Ambulatory Visit: Payer: No Typology Code available for payment source | Attending: Gerontology

## 2021-01-20 DIAGNOSIS — R262 Difficulty in walking, not elsewhere classified: Secondary | ICD-10-CM | POA: Insufficient documentation

## 2021-01-20 DIAGNOSIS — M6281 Muscle weakness (generalized): Secondary | ICD-10-CM | POA: Insufficient documentation

## 2021-01-20 DIAGNOSIS — G8929 Other chronic pain: Secondary | ICD-10-CM | POA: Insufficient documentation

## 2021-01-20 DIAGNOSIS — M25562 Pain in left knee: Secondary | ICD-10-CM | POA: Insufficient documentation

## 2021-01-20 NOTE — Therapy (Signed)
[facility] PHYSICAL AND SPORTS MEDICINE [address], [place], [address] Phone: [phone]   Fax:  [phone]  Physical Therapy Treatment/ Reassessment  Reporting Period: 12/04/2020 - 01/20/2021  Patient Details  Name: Courtney Stafford MRN: [id number] Date of Birth: Apr 19, 1971 Referring Provider (PT): Lewie Chamber, MD   Encounter Date: 01/20/2021   PT End of Session - 01/20/21 1226    Visit Number 21    Number of Visits 21    Date for PT Re-Evaluation 01/15/21    PT Start Time 0945    PT Stop Time 1030    PT Time Calculation (min) 45 min    Equipment Utilized During Treatment Gait belt    Activity Tolerance Patient tolerated treatment well;No increased pain    Behavior During Therapy WFL for tasks assessed/performed           Past Medical History:  Diagnosis Date  . Diabetes mellitus without complication (HCC)   . Hypertension   . Stroke [facility]) 07/31/2020    Past Surgical History:  Procedure Laterality Date  . CARPAL TUNNEL RELEASE  6 years ago   both hands    There were no vitals filed for this visit.   Subjective Assessment - 01/20/21 0953    Subjective Patient reports she was able to walk to the store but reports increased fatigue with performance.    Pertinent History Recent CVA 07/30/20.  Left knee pain. Pt was seen by Allie Bossier in May 2021, who diagnosed knee OA questionable need for TKA in future, has recommended conservative management at this point. Pt had a cortizone injection in May (without avail) and is shceduled for a 'gel' injection on 6/28.    Limitations Walking;Lifting;Standing;House hold activities    How long can you sit comfortably? unlimited    How long can you stand comfortably? ~5 minutes    How long can you walk comfortably? ~2-3 minutes    Diagnostic tests Diagnosed CVA    Patient Stated Goals To be able to walk without the use of an assitive device.    Currently in Pain? No/denies    Pain  Onset More than a month ago             TREATMENT Gait training Walking for 87ft with use of SPC Ambulation with focus on performing curbs and unstable surfaces outside - 599ft Side stepping B - x 160ft  Therapeutic Exercise Leg Press B [id number]# -- 3 x 10 Nustep level 6 performed for 5 min to improve LE strength  Performed exercises to address LE strength       PT Education - 01/20/21 1013    Education Details form/technique with exercise    Person(s) Educated Patient    Methods Explanation;Demonstration    Comprehension Verbalized understanding;Returned demonstration            PT Short Term Goals - 08/12/20 1144      PT SHORT TERM GOAL #1   Title Patient will demonstrate independence with HEP to maximize rehab potential.    Time 4    Period Weeks    Status New    Target Date 09/09/20             PT Long Term Goals - 01/20/21 1024      PT LONG TERM GOAL #1   Title Patient will demonstrate independence with progressive HEP to maintain and progress improvements achieved during therapy.    Baseline moderate cueing with exercise; 1/20/2022minimal cueing  to perform    Time 8    Period Weeks    Status Achieved      PT LONG TERM GOAL #2   Title Patient will have a FOTO score of >68/100 to show an increase in independence while performing functional daily activities.    Baseline 08/12/20: 49; 10/14/2020: 52; 12/04/2020; 51; 01/20/2021: 46    Time 8    Period Weeks    Status On-going      PT LONG TERM GOAL #3   Title Patient will demonstrate improved facility and steadiness in ambulation while performing at a gait speed of >0.6m/s.    Baseline 08/12/20: 0.37 m/s; 10/14/2020: .75 m/s; 12/04/2020: .827 m/s    Time 8    Period Weeks    Status Achieved      PT LONG TERM GOAL #4   Title Patient will improve 5xSTS and TUG score to under 12 sec to show an increase in patient strength/power to decrease patients fall risk.    Baseline 08/12/20: 5xSTS: 21.5sec, TUG:  25.7sec; 10/14/20: 5xSTS: 12.6sec, TUG: 15.7sec; 12/05/2019: 5xSTS: 11.25 sec; TUG: 11.75 sec    Time 8    Period Weeks    Status Achieved      PT LONG TERM GOAL #5   Title Patient will be able to reach up to grab items out of her cabinets with R UE at home to show an improvement in her strength and ROM to perform task independently.    Baseline 90 Degrees of Flexion due to weakness/unable to reach up to cabinet; 12/04/2020: Full AROM    Time 8    Period Weeks    Status Achieved      PT LONG TERM GOAL #6   Title Patient will improve to over 1350 ft to indicate significant improvment in cardiovascular endurance.    Baseline 860ft; 01/20/2021: 724ft    Time 6    Period Weeks    Status On-going                 Plan - 01/20/21 1227    Clinical Impression Statement Reassessment during today's session. Patient with two worsening scores of and FOTO. This is most likely seocndary to using a less restrictive AD of a SPC vs a FWW. Patient demonstrates decreased confidence with SPC secondary to dissuse. She is beginning to use [facility] for amb but demonstrates difficulty in terms of speed and stabilization over stepping actions. Patient will benefit from further skiled therapy to return to prior level of function.    Personal Factors and Comorbidities Age;Behavior Pattern;Fitness;Transportation;Time since onset of injury/illness/exacerbation    Examination-Activity Limitations Toileting;Dressing;Sit;Transfers;Bed Mobility;Lift;Bend;Squat;Locomotion Level;Stairs;Carry;Stand;Reach Overhead    Examination-Participation Restrictions Yard Work;Community Activity;Shop;Driving    Stability/Clinical Decision Making Stable/Uncomplicated    Rehab Potential Good    PT Frequency 2x / week    PT Duration 8 weeks    PT Treatment/Interventions ADLs/Self Care Home Management;Cryotherapy;Electrical Stimulation;Moist Heat;DME Instruction;Gait training;Stair training;Functional mobility  training;Therapeutic activities;Therapeutic exercise;Balance training;Neuromuscular re-education;Patient/family education;Manual techniques;Passive range of motion;Dry needling;Taping;Energy conservation;Ultrasound    PT Next Visit Plan Exercises to increase R strength, Gait with SPC    PT Home Exercise Plan STS and walking with AD    Consulted and Agree with Plan of Care Patient           Patient will benefit from skilled therapeutic intervention in order to improve the following deficits and impairments:  Decreased endurance,Decreased mobility,Abnormal gait,Difficulty walking,Decreased range of motion,Obesity,Decreased activity tolerance,Decreased knowledge of use  of DME,Decreased strength,Postural dysfunction,Pain,Improper body mechanics,Hypomobility,Decreased balance,Impaired perceived functional ability,Decreased safety awareness  Visit Diagnosis: Muscle weakness (generalized)  Difficulty in walking, not elsewhere classified  Chronic pain of left knee     Problem List Patient Active Problem List   Diagnosis Date Noted  . Protrusion of thoracic intervertebral disc 08/02/2020  . CVA (cerebral vascular accident) (HCC) 07/31/2020  . Urinary frequency 05/21/2020  . Unilateral primary osteoarthritis, left knee 03/17/2020  . Left knee pain 12/11/2019  . Swelling of lower leg 12/04/2019  . Pain and swelling of lower leg, left 12/04/2019  . Insomnia 08/28/2019  . 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 HTN (hypertension) 10/22/2015  . Type 1 diabetes (HCC) 10/22/2015  . Anemia 10/22/2015  . Restless legs 10/22/2015  . Hematuria 04/09/2015    Myrene Galas, PT DPT 01/20/2021, 12:31 PM  [facility] PHYSICAL AND SPORTS MEDICINE [address] [place], [address] Phone: [phone]   Fax:  [phone]  Name: Courtney Stafford MRN: [id number] Date of Birth: Mar 13, 1971

## 2021-01-21 ENCOUNTER — Ambulatory Visit: Payer: Self-pay | Admitting: Gerontology

## 2021-01-21 ENCOUNTER — Encounter: Payer: Self-pay | Admitting: Gerontology

## 2021-01-21 ENCOUNTER — Other Ambulatory Visit: Payer: Self-pay | Admitting: Gerontology

## 2021-01-21 VITALS — BP 142/92 | HR 84 | Temp 97.1°F | Resp 16 | Wt 194.5 lb

## 2021-01-21 DIAGNOSIS — I10 Essential (primary) hypertension: Secondary | ICD-10-CM

## 2021-01-21 DIAGNOSIS — E109 Type 1 diabetes mellitus without complications: Secondary | ICD-10-CM

## 2021-01-21 DIAGNOSIS — G8929 Other chronic pain: Secondary | ICD-10-CM

## 2021-01-21 DIAGNOSIS — M25562 Pain in left knee: Secondary | ICD-10-CM

## 2021-01-21 MED ORDER — HYDROCHLOROTHIAZIDE 25 MG PO TABS: 25.0000 mg | ORAL_TABLET | Freq: Every day | ORAL | 1 refills | Status: DC

## 2021-01-21 MED ORDER — MELOXICAM 7.5 MG PO TABS: 7.5000 mg | ORAL_TABLET | Freq: Every day | ORAL | 0 refills | Status: DC | PRN

## 2021-01-21 MED ORDER — LISINOPRIL 40 MG PO TABS: 40.0000 mg | ORAL_TABLET | Freq: Every day | ORAL | 0 refills | Status: DC

## 2021-01-21 MED ORDER — AMLODIPINE BESYLATE 10 MG PO TABS: 10.0000 mg | ORAL_TABLET | Freq: Every day | ORAL | 0 refills | Status: DC

## 2021-01-21 MED ORDER — CARVEDILOL 12.5 MG PO TABS: 12.5000 mg | ORAL_TABLET | Freq: Two times a day (BID) | ORAL | 3 refills | Status: DC

## 2021-01-21 NOTE — Progress Notes (Signed)
Established Patient Office Visit  Subjective:  Patient ID: Courtney Stafford, female    DOB: 02-26-1971  Age: 50 y.o. MRN: [id number]  CC: No chief complaint on file.   HPI Courtney Stafford  50 y/o female with history of Type 1 diabetes, hypertension,and stroke presents for routine follow up visit, lab review and medication refill.  Her HgbA1c done on 01/14/21 decreased from 8.4% to 8.2%. She states that she's compliant with her medications and continues to make healthy lifestyle modifications. She checks her blood glucose tid and blood pressure daily, but she apologized for not bringing her log. Currently, she states that her blood glucose was 77 mg/dl prior to bedtime, she ate sandwich before bed, but she fell out of her bed when she rolled over in the morning. She states that she was dizzy, shaky, but conscious. She didn't check her blood glucose until after eating hard candy and it was 153 mg/dl. She states that she won't be able to have Colonoscopy done but was provided with an Occult stool card.  She continues to experience intermittent pain to her left knee, follows up with PT weekly and Meloxicam relieves symptoms.Overall, she states that she's doing well and offers no further complaint.  Past Medical History:  Diagnosis Date  . Diabetes mellitus without complication (HCC)   . Hypertension   . Stroke [facility]) 07/31/2020    Past Surgical History:  Procedure Laterality Date  . CARPAL TUNNEL RELEASE  6 years ago   both hands    Family History  Problem Relation Age of Onset  . Cancer Father   . Diabetes Maternal Grandmother   . Breast cancer Neg Hx     Social History   Socioeconomic History  . Marital status: Single    Spouse name: Not on file  . Number of children: 2  . Years of education: Not on file  . Highest education level: High school graduate  Occupational History  . Occupation: unemployed  Tobacco Use  . Smoking status: Current Some Day Smoker    Packs/day: 0.01     Types: Cigarettes  . Smokeless tobacco: Former Neurosurgeon  . Tobacco comment: occasional  Vaping Use  . Vaping Use: Never used  Substance and Sexual Activity  . Alcohol use: No  . Drug use: No  . Sexual activity: Not on file  Other Topics Concern  . Not on file  Social History Narrative   Completed biopsychosocial assessment, social determinants screening, administered PHQ 9, and GAD 7.       Social determinants screening completed 01/31/2020. HS She receives bus passes [facility] for transportation. She receives food stamps 192 per month and utilizes a Programme researcher, broadcasting/film/video when needed. [employer] pays for housing and utilities.  She follows up with therapist at clinic for stress, anxiety, and depression. HS      Support system is small.   Social Determinants of Health   Financial Resource Strain: Low Risk   . Difficulty of Paying Living Expenses: Not very hard  Food Insecurity: No Food Insecurity  . Worried About Programme researcher, broadcasting/film/video in the Last Year: Never true  . Ran Out of Food in the Last Year: Never true  Transportation Needs: Unmet Transportation Needs  . Lack of Transportation (Medical): Yes  . Lack of Transportation (Non-Medical): No  Physical Activity: Insufficiently Active  . Days of Exercise per Week: 3 days  . Minutes of Exercise per Session: 40 min  Stress: Stress Concern Present  .  Feeling of Stress : Rather much  Social Connections: Socially Isolated  . Frequency of Communication with Friends and Family: Never  . Frequency of Social Gatherings with Friends and Family: Never  . Attends Religious Services: Never  . Active Member of Clubs or Organizations: No  . Attends BankerClub or Organization Meetings: Never  . Marital Status: Never married  Intimate Partner Violence: Not At Risk  . Fear of Current or Ex-Partner: No  . Emotionally Abused: No  . Physically Abused: No  . Sexually Abused: No    Outpatient Medications Prior to Visit  Medication Sig Dispense  Refill  . aspirin EC 81 MG EC tablet Take 1 tablet (81 mg total) by mouth daily. Swallow whole. 30 tablet 11  . atorvastatin (LIPITOR) 80 MG tablet Take 1 tablet (80 mg total) by mouth daily. 90 tablet 3  . DULoxetine (CYMBALTA) 60 MG capsule Take 1 capsule (60 mg total) by mouth every morning. 30 capsule 2  . hydrALAZINE (APRESOLINE) 25 MG tablet Take 1 tablet (25 mg total) by mouth 2 (two) times daily. 60 tablet 2  . insulin aspart (NOVOLOG FLEXPEN) 100 UNIT/ML FlexPen She states taking 5-7 units with meals and adjust with sliding scale as prescribed by Endocrinology.. (Patient taking differently: She states taking 5-9 units with meals and adjust with sliding scale as prescribed by Endocrinology.Marland Kitchen.) 75 mL 3  . insulin glargine (LANTUS) 100 UNIT/ML Solostar Pen Inject 30 Units into the skin at bedtime. 30 mL 4  . amLODipine (NORVASC) 10 MG tablet Take 1 tablet (10 mg total) by mouth daily. 90 tablet 0  . carvedilol (COREG) 12.5 MG tablet Take 1 tablet (12.5 mg total) by mouth 2 (two) times daily with a meal. 60 tablet 3  . hydrochlorothiazide (HYDRODIURIL) 25 MG tablet Take 1 tablet (25 mg total) by mouth daily. 90 tablet 1  . lisinopril (ZESTRIL) 40 MG tablet Take 1 tablet (40 mg total) by mouth daily. 90 tablet 0  . meloxicam (MOBIC) 7.5 MG tablet Take 1 tablet (7.5 mg total) by mouth daily as needed for pain. 30 tablet 0  . Blood Glucose Monitoring Suppl Supplies MISC 1 each by Does not apply route 5 (five) times daily. (Patient taking differently: 1 each by Does not apply route. Check 3 to 4 times daily) 500 each 4  . gabapentin (NEURONTIN) 300 MG capsule TAKE 2 CAPSULES (600MG ) BY MOUTH 2 TIMES A DAY AND 3 CAPSULES (900MG ) AT NIGHT 210 capsule 0  . Insulin Pen Needle (BD PEN NEEDLE NANO U/F) 32G X 4 MM MISC 5 times daily (Patient taking differently: 3 to 4 times daily) 200 each 12  . mirtazapine (REMERON) 30 MG tablet Take 1 tablet (30 mg total) by mouth at bedtime. 30 tablet 2   No  facility-administered medications prior to visit.    Allergies  Allergen Reactions  . Phenergan [Promethazine] Hives    ROS Review of Systems  Constitutional: Negative.   Eyes: Negative.   Respiratory: Negative.   Cardiovascular: Negative.   Endocrine: Negative.   Skin: Negative.   Neurological: Negative.   Psychiatric/Behavioral: Negative.       Objective:    Physical Exam HENT:     Head: Normocephalic and atraumatic.  Eyes:     Extraocular Movements: Extraocular movements intact.     Conjunctiva/sclera: Conjunctivae normal.     Pupils: Pupils are equal, round, and reactive to light.  Cardiovascular:     Rate and Rhythm: Normal rate and regular rhythm.  Pulses: Normal pulses.     Heart sounds: Normal heart sounds.  Pulmonary:     Effort: Pulmonary effort is normal.     Breath sounds: Normal breath sounds.  Musculoskeletal:        General: Tenderness (mild tenderness with palpation) present.  Skin:    General: Skin is warm.  Neurological:     General: No focal deficit present.     Mental Status: She is alert and oriented to person, place, and time. Mental status is at baseline.  Psychiatric:        Mood and Affect: Mood normal.        Behavior: Behavior normal.        Thought Content: Thought content normal.        Judgment: Judgment normal.     BP (!) 142/92 (BP Location: Right Arm, Patient Position: Sitting, Cuff Size: Large)   Pulse 84   Temp (!) 97.1 F (36.2 C)   Resp 16   Wt 194 lb 8 oz (88.2 kg)   LMP 01/03/2016 (Within Years)   SpO2 97%   BMI 37.99 kg/m  Wt Readings from Last 3 Encounters:  01/21/21 194 lb 8 oz (88.2 kg)  01/14/21 195 lb 8 oz (88.7 kg)  11/26/20 193 lb 8 oz (87.8 kg)   Encouraged weight loss  Health Maintenance Due  Topic Date Due  . Hepatitis C Screening  Never done  . COVID-19 Vaccine (1) Never done  . HIV Screening  Never done  . TETANUS/TDAP  Never done  . COLONOSCOPY (Pts 45-54yrs Insurance coverage will need  to be confirmed)  Never done  . OPHTHALMOLOGY EXAM  03/23/2019  . INFLUENZA VACCINE  06/15/2020    There are no preventive care reminders to display for this patient.  Lab Results  Component Value Date   TSH 0.951 07/31/2020   Lab Results  Component Value Date   WBC 9.2 08/02/2020   HGB 16.0 (H) 08/02/2020   HCT 48.5 (H) 08/02/2020   MCV 83.9 08/02/2020   PLT 328 08/02/2020   Lab Results  Component Value Date   NA 142 01/14/2021   K 3.9 01/14/2021   CO2 22 01/14/2021   GLUCOSE 155 (H) 01/14/2021   BUN 10 01/14/2021   CREATININE 0.94 01/14/2021   BILITOT 0.5 01/14/2021   ALKPHOS 133 (H) 01/14/2021   AST 18 01/14/2021   ALT 12 01/14/2021   PROT 7.4 01/14/2021   ALBUMIN 4.5 01/14/2021   CALCIUM 9.6 01/14/2021   ANIONGAP 11 08/02/2020   Lab Results  Component Value Date   CHOL 218 (H) 07/31/2020   Lab Results  Component Value Date   HDL 78 07/31/2020   Lab Results  Component Value Date   LDLCALC 109 (H) 07/31/2020   Lab Results  Component Value Date   TRIG 157 (H) 07/31/2020   Lab Results  Component Value Date   CHOLHDL 2.8 07/31/2020   Lab Results  Component Value Date   HGBA1C 8.2 (H) 01/14/2021      Assessment & Plan:   1. Essential hypertension - Her blood pressure is improving, she will continue on current treatment regimen, DASH diet and exercise as tolerated. - amLODipine (NORVASC) 10 MG tablet; Take 1 tablet (10 mg total) by mouth daily.  Dispense: 90 tablet; Refill: 0 - carvedilol (COREG) 12.5 MG tablet; Take 1 tablet (12.5 mg total) by mouth 2 (two) times daily with a meal.  Dispense: 60 tablet; Refill: 3 - hydrochlorothiazide (HYDRODIURIL) 25 MG  tablet; Take 1 tablet (25 mg total) by mouth daily.  Dispense: 90 tablet; Refill: 1 - lisinopril (ZESTRIL) 40 MG tablet; Take 1 tablet (40 mg total) by mouth daily.  Dispense: 90 tablet; Refill: 0  2. Chronic pain of left knee - She will continue on Mobic and PT - meloxicam (MOBIC) 7.5 MG tablet;  Take 1 tablet (7.5 mg total) by mouth daily as needed for pain.  Dispense: 30 tablet; Refill: 0  3. Type 1 diabetes mellitus without complication (HCC) - Her HgbA1c was 8.2% and her goal is less than 6%. She will continue on current medication, low carb/non concentrated sweet diet and exercise as tolerated. - HgB A1c; Future     Follow-up: Return in about 13 weeks (around 04/22/2021), or if symptoms worsen or fail to improve.    Chaise Passarella Trellis Paganini, NP

## 2021-01-21 NOTE — Patient Instructions (Signed)
https://www.nhlbi.nih.gov/files/docs/public/heart/dash_brief.pdf">  DASH Eating Plan DASH stands for Dietary Approaches to Stop Hypertension. The DASH eating plan is a healthy eating plan that has been shown to:  Reduce high blood pressure (hypertension).  Reduce your risk for type 2 diabetes, heart disease, and stroke.  Help with weight loss. What are tips for following this plan? Reading food labels  Check food labels for the amount of salt (sodium) per serving. Choose foods with less than 5 percent of the Daily Value of sodium. Generally, foods with less than 300 milligrams (mg) of sodium per serving fit into this eating plan.  To find whole grains, look for the word "whole" as the first word in the ingredient list. Shopping  Buy products labeled as "low-sodium" or "no salt added."  Buy fresh foods. Avoid canned foods and pre-made or frozen meals. Cooking  Avoid adding salt when cooking. Use salt-free seasonings or herbs instead of table salt or sea salt. Check with your health care provider or pharmacist before using salt substitutes.  Do not fry foods. Cook foods using healthy methods such as baking, boiling, grilling, roasting, and broiling instead.  Cook with heart-healthy oils, such as olive, canola, avocado, soybean, or sunflower oil. Meal planning  Eat a balanced diet that includes: ? 4 or more servings of fruits and 4 or more servings of vegetables each day. Try to fill one-half of your plate with fruits and vegetables. ? 6-8 servings of whole grains each day. ? Less than 6 oz (170 g) of lean meat, poultry, or fish each day. A 3-oz (85-g) serving of meat is about the same size as a deck of cards. One egg equals 1 oz (28 g). ? 2-3 servings of low-fat dairy each day. One serving is 1 cup (237 mL). ? 1 serving of nuts, seeds, or beans 5 times each week. ? 2-3 servings of heart-healthy fats. Healthy fats called omega-3 fatty acids are found in foods such as walnuts,  flaxseeds, fortified milks, and eggs. These fats are also found in cold-water fish, such as sardines, salmon, and mackerel.  Limit how much you eat of: ? Canned or prepackaged foods. ? Food that is high in trans fat, such as some fried foods. ? Food that is high in saturated fat, such as fatty meat. ? Desserts and other sweets, sugary drinks, and other foods with added sugar. ? Full-fat dairy products.  Do not salt foods before eating.  Do not eat more than 4 egg yolks a week.  Try to eat at least 2 vegetarian meals a week.  Eat more home-cooked food and less restaurant, buffet, and fast food.   Lifestyle  When eating at a restaurant, ask that your food be prepared with less salt or no salt, if possible.  If you drink alcohol: ? Limit how much you use to:  0-1 drink a day for women who are not pregnant.  0-2 drinks a day for men. ? Be aware of how much alcohol is in your drink. In the [place] one drink equals one 12 oz bottle of beer (355 mL), one 5 oz glass of wine (148 mL), or one 1 oz glass of hard liquor (44 mL). General information  Avoid eating more than 2,300 mg of salt a day. If you have hypertension, you may need to reduce your sodium intake to 1,500 mg a day.  Work with your health care provider to maintain a healthy body weight or to lose weight. Ask what an ideal weight is for   you.  Get at least 30 minutes of exercise that causes your heart to beat faster (aerobic exercise) most days of the week. Activities may include walking, swimming, or biking.  Work with your health care provider or dietitian to adjust your eating plan to your individual calorie needs. What foods should I eat? Fruits All fresh, dried, or frozen fruit. Canned fruit in natural juice (without added sugar). Vegetables Fresh or frozen vegetables (raw, steamed, roasted, or grilled). Low-sodium or reduced-sodium tomato and vegetable juice. Low-sodium or reduced-sodium tomato sauce and tomato paste.  Low-sodium or reduced-sodium canned vegetables. Grains Whole-grain or whole-wheat bread. Whole-grain or whole-wheat pasta. Brown rice. Oatmeal. Quinoa. Bulgur. Whole-grain and low-sodium cereals. Pita bread. Low-fat, low-sodium crackers. Whole-wheat flour tortillas. Meats and other proteins Skinless chicken or turkey. Ground chicken or turkey. Pork with fat trimmed off. Fish and seafood. Egg whites. Dried beans, peas, or lentils. Unsalted nuts, nut butters, and seeds. Unsalted canned beans. Lean cuts of beef with fat trimmed off. Low-sodium, lean precooked or cured meat, such as sausages or meat loaves. Dairy Low-fat (1%) or fat-free (skim) milk. Reduced-fat, low-fat, or fat-free cheeses. Nonfat, low-sodium ricotta or cottage cheese. Low-fat or nonfat yogurt. Low-fat, low-sodium cheese. Fats and oils Soft margarine without trans fats. Vegetable oil. Reduced-fat, low-fat, or light mayonnaise and salad dressings (reduced-sodium). Canola, safflower, olive, avocado, soybean, and sunflower oils. Avocado. Seasonings and condiments Herbs. Spices. Seasoning mixes without salt. Other foods Unsalted popcorn and pretzels. Fat-free sweets. The items listed above may not be a complete list of foods and beverages you can eat. Contact a dietitian for more information. What foods should I avoid? Fruits Canned fruit in a light or heavy syrup. Fried fruit. Fruit in cream or butter sauce. Vegetables Creamed or fried vegetables. Vegetables in a cheese sauce. Regular canned vegetables (not low-sodium or reduced-sodium). Regular canned tomato sauce and paste (not low-sodium or reduced-sodium). Regular tomato and vegetable juice (not low-sodium or reduced-sodium). Pickles. Olives. Grains Baked goods made with fat, such as croissants, muffins, or some breads. Dry pasta or rice meal packs. Meats and other proteins Fatty cuts of meat. Ribs. Fried meat. Bacon. Bologna, salami, and other precooked or cured meats, such as  sausages or meat loaves. Fat from the back of a pig (fatback). Bratwurst. Salted nuts and seeds. Canned beans with added salt. Canned or smoked fish. Whole eggs or egg yolks. Chicken or turkey with skin. Dairy Whole or 2% milk, cream, and half-and-half. Whole or full-fat cream cheese. Whole-fat or sweetened yogurt. Full-fat cheese. Nondairy creamers. Whipped toppings. Processed cheese and cheese spreads. Fats and oils Butter. Stick margarine. Lard. Shortening. Ghee. Bacon fat. Tropical oils, such as coconut, palm kernel, or palm oil. Seasonings and condiments Onion salt, garlic salt, seasoned salt, table salt, and sea salt. Worcestershire sauce. Tartar sauce. Barbecue sauce. Teriyaki sauce. Soy sauce, including reduced-sodium. Steak sauce. Canned and packaged gravies. Fish sauce. Oyster sauce. Cocktail sauce. Store-bought horseradish. Ketchup. Mustard. Meat flavorings and tenderizers. Bouillon cubes. Hot sauces. Pre-made or packaged marinades. Pre-made or packaged taco seasonings. Relishes. Regular salad dressings. Other foods Salted popcorn and pretzels. The items listed above may not be a complete list of foods and beverages you should avoid. Contact a dietitian for more information. Where to find more information  National Heart, Lung, and Blood Institute: www.nhlbi.nih.gov  American Heart Association: www.heart.org  Academy of Nutrition and Dietetics: www.eatright.org  National Kidney Foundation: www.kidney.org Summary  The DASH eating plan is a healthy eating plan that has been shown to   reduce high blood pressure (hypertension). It may also reduce your risk for type 2 diabetes, heart disease, and stroke.  When on the DASH eating plan, aim to eat more fresh fruits and vegetables, whole grains, lean proteins, low-fat dairy, and heart-healthy fats.  With the DASH eating plan, you should limit salt (sodium) intake to 2,300 mg a day. If you have hypertension, you may need to reduce your  sodium intake to 1,500 mg a day.  Work with your health care provider or dietitian to adjust your eating plan to your individual calorie needs. This information is not intended to replace advice given to you by your health care provider. Make sure you discuss any questions you have with your health care provider. Document Revised: 10/05/2019 Document Reviewed: 10/05/2019 Elsevier Patient Education  2021 Elsevier Inc. https://www.diabeteseducator.org/docs/default-source/living-with-diabetes/conquering-the-grocery-store-v1.pdf?sfvrsn=4">  Carbohydrate Counting for Diabetes Mellitus, Adult Carbohydrate counting is a method of keeping track of how many carbohydrates you eat. Eating carbohydrates naturally increases the amount of sugar (glucose) in the blood. Counting how many carbohydrates you eat improves your blood glucose control, which helps you manage your diabetes. It is important to know how many carbohydrates you can safely have in each meal. This is different for every person. A dietitian can help you make a meal plan and calculate how many carbohydrates you should have at each meal and snack. What foods contain carbohydrates? Carbohydrates are found in the following foods:  Grains, such as breads and cereals.  Dried beans and soy products.  Starchy vegetables, such as potatoes, peas, and corn.  Fruit and fruit juices.  Milk and yogurt.  Sweets and snack foods, such as cake, cookies, candy, chips, and soft drinks.   How do I count carbohydrates in foods? There are two ways to count carbohydrates in food. You can read food labels or learn standard serving sizes of foods. You can use either of the methods or a combination of both. Using the Nutrition Facts label The Nutrition Facts list is included on the labels of almost all packaged foods and beverages in the [place] It includes:  The serving size.  Information about nutrients in each serving, including the grams (g) of carbohydrate  per serving. To use the Nutrition Facts:  Decide how many servings you will have.  Multiply the number of servings by the number of carbohydrates per serving.  The resulting number is the total amount of carbohydrates that you will be having. Learning the standard serving sizes of foods When you eat carbohydrate foods that are not packaged or do not include Nutrition Facts on the label, you need to measure the servings in order to count the amount of carbohydrates.  Measure the foods that you will eat with a food scale or measuring cup, if needed.  Decide how many standard-size servings you will eat.  Multiply the number of servings by 15. For foods that contain carbohydrates, one serving equals 15 g of carbohydrates. ? For example, if you eat 2 cups or 10 oz (300 g) of strawberries, you will have eaten 2 servings and 30 g of carbohydrates (2 servings x 15 g = 30 g).  For foods that have more than one food mixed, such as soups and casseroles, you must count the carbohydrates in each food that is included. The following list contains standard serving sizes of common carbohydrate-rich foods. Each of these servings has about 15 g of carbohydrates:  1 slice of bread.  1 six-inch (15 cm) tortilla.  ? cup or 2   oz (53 g) cooked rice or pasta.   cup or 3 oz (85 g) cooked or canned, drained and rinsed beans or lentils.   cup or 3 oz (85 g) starchy vegetable, such as peas, corn, or squash.   cup or 4 oz (120 g) hot cereal.   cup or 3 oz (85 g) boiled or mashed potatoes, or  or 3 oz (85 g) of a large baked potato.   cup or 4 fl oz (118 mL) fruit juice.  1 cup or 8 fl oz (237 mL) milk.  1 small or 4 oz (106 g) apple.   or 2 oz (63 g) of a medium banana.  1 cup or 5 oz (150 g) strawberries.  3 cups or 1 oz (24 g) popped popcorn. What is an example of carbohydrate counting? To calculate the number of carbohydrates in this sample meal, follow the steps shown below. Sample  meal  3 oz (85 g) chicken breast.  ? cup or 4 oz (106 g) brown rice.   cup or 3 oz (85 g) corn.  1 cup or 8 fl oz (237 mL) milk.  1 cup or 5 oz (150 g) strawberries with sugar-free whipped topping. Carbohydrate calculation 1. Identify the foods that contain carbohydrates: ? Rice. ? Corn. ? Milk. ? Strawberries. 2. Calculate how many servings you have of each food: ? 2 servings rice. ? 1 serving corn. ? 1 serving milk. ? 1 serving strawberries. 3. Multiply each number of servings by 15 g: ? 2 servings rice x 15 g = 30 g. ? 1 serving corn x 15 g = 15 g. ? 1 serving milk x 15 g = 15 g. ? 1 serving strawberries x 15 g = 15 g. 4. Add together all of the amounts to find the total grams of carbohydrates eaten: ? 30 g + 15 g + 15 g + 15 g = 75 g of carbohydrates total. What are tips for following this plan? Shopping  Develop a meal plan and then make a shopping list.  Buy fresh and frozen vegetables, fresh and frozen fruit, dairy, eggs, beans, lentils, and whole grains.  Look at food labels. Choose foods that have more fiber and less sugar.  Avoid processed foods and foods with added sugars. Meal planning  Aim to have the same amount of carbohydrates at each meal and for each snack time.  Plan to have regular, balanced meals and snacks. Where to find more information  American Diabetes Association: www.diabetes.org  Centers for Disease Control and Prevention: www.cdc.gov Summary  Carbohydrate counting is a method of keeping track of how many carbohydrates you eat.  Eating carbohydrates naturally increases the amount of sugar (glucose) in the blood.  Counting how many carbohydrates you eat improves your blood glucose control, which helps you manage your diabetes.  A dietitian can help you make a meal plan and calculate how many carbohydrates you should have at each meal and snack. This information is not intended to replace advice given to you by your health care  provider. Make sure you discuss any questions you have with your health care provider. Document Revised: 11/01/2019 Document Reviewed: 11/02/2019 Elsevier Patient Education  2021 Elsevier Inc.  

## 2021-01-23 ENCOUNTER — Ambulatory Visit: Payer: No Typology Code available for payment source

## 2021-01-28 ENCOUNTER — Ambulatory Visit: Payer: Self-pay | Admitting: Licensed Clinical Social Worker

## 2021-01-28 ENCOUNTER — Telehealth: Payer: Self-pay | Admitting: Pharmacist

## 2021-01-28 ENCOUNTER — Other Ambulatory Visit: Payer: Self-pay

## 2021-01-28 DIAGNOSIS — F331 Major depressive disorder, recurrent, moderate: Secondary | ICD-10-CM

## 2021-01-28 DIAGNOSIS — F411 Generalized anxiety disorder: Secondary | ICD-10-CM

## 2021-01-28 NOTE — BH Specialist Note (Signed)
Integrated Behavioral Health Follow Up In-Person Visit  MRN: [id number] Name: Courtney Stafford   Total time: 30 minutes  Types of Service: General Behavioral Integrated Care (BHI)  Interpretor:No. Interpretor Name and Language:   Subjective: Courtney Stafford is a 50 y.o. female accompanied by herself  Patient was referred by Hurman Horn, NP for mental health. Patient reports the following symptoms/concerns: The patient stated that her knees are still hurting a little bit. She noted that physical therapy has been reduced to once a week. She shared that she heard the [place] service to physical therapy is being discontinued next month and is not sure how she can get to her physical therapy session. She discussed that she recently fell off her bed onto the floor because her sugar dropped. She stated that because she had her walker beside the bed she was able to struggle and get back up. She discussed how she is able to use a cane to walk a short distance currently. She stated that she is isolated in her home and rarely leaves except for going to a medical appointment.  She feels this is why she is getting more irritable lately. The patient notes that sh would like to go to the store and other places without having to worry so much about how she is going to get there and get back home. The patient denied any suicidal or homicidal thoughts. Duration of problem: ; Severity of problem: moderate  Objective: Mood: Euthymic and Affect: Appropriate Risk of harm to self or others: No plan to harm self or others  Life Context: Family and Social: see above School/Work: see above Self-Care: see above Life Changes: see above  Patient and/or Family's Strengths/Protective Factors: Concrete supports in place (healthy food, safe environments, etc.)  Goals Addressed: Patient will: 1.  Reduce symptoms of: anxiety, depression and stress  2.  Increase knowledge and/or ability of: coping skills, healthy  habits and stress reduction  3.  Demonstrate ability to: Increase healthy adjustment to current life circumstances  Progress towards Goals: Ongoing  Interventions: Interventions utilized:  CBT Cognitive Behavioral Therapy was utilized by the clinician during today's follow up session. The clinician processed with the patient how they have been doing since the last follow-up session. The clinician provided a space for the patient to ventilate their frustrations regarding their current life circumstances. Clinician measured the patient's anxiety and depression on a numerical scale. was utilized by the clinician during today's follow up session. Clinician encouraged the patient to schedule a "worry time" to write her concerns in a journal during that time. Clinician explained to the patient when she starts to worry she can utilize relaxation techniques until it passes and write the worry in the journal later to share at her next follow-up session.  Standardized Assessments completed: GAD-7 and PHQ 9  GAD-7   17 PHQ-9  16  Assessment: Patient currently experiencing see above  Patient may benefit from see above  Plan: 1. Follow up with behavioral health clinician on : 02/24/2021 at 2:00 PM  2. Behavioral recommendations:  3. Referral(s): [facility] [facility] (In Clinic) 4. "From scale of 1-10, how likely are you to follow plan?":   Judith Part, Student-Social Work

## 2021-01-28 NOTE — Telephone Encounter (Signed)
Provided 2022 proof of income. Approved to receive medication assistance at [facility] until time for re-certification in 5364, and as long as eligibility criteria continues to be met.   [place]

## 2021-01-29 ENCOUNTER — Ambulatory Visit: Payer: No Typology Code available for payment source

## 2021-02-03 ENCOUNTER — Other Ambulatory Visit: Payer: Self-pay

## 2021-02-03 ENCOUNTER — Ambulatory Visit: Payer: No Typology Code available for payment source

## 2021-02-03 DIAGNOSIS — M6281 Muscle weakness (generalized): Secondary | ICD-10-CM

## 2021-02-03 DIAGNOSIS — G8929 Other chronic pain: Secondary | ICD-10-CM

## 2021-02-03 DIAGNOSIS — R262 Difficulty in walking, not elsewhere classified: Secondary | ICD-10-CM

## 2021-02-03 NOTE — Therapy (Signed)
[facility]ICINE [address] [place], [address] Phone: [phone]   Fax:  [phone]  Physical Therapy Treatment  Patient Details  Name: Courtney Stafford MRN: [id number] Date of Birth: June 21, 1971 Referring Provider (PT): Lewie Chamber, MD   Encounter Date: 02/03/2021   PT End of Session - 02/03/21 1630    Visit Number 22    Number of Visits 30    Date for PT Re-Evaluation 04/04/21    PT Start Time 0945    PT Stop Time 1030    PT Time Calculation (min) 45 min    Equipment Utilized During Treatment Gait belt    Activity Tolerance Patient tolerated treatment well;No increased pain    Behavior During Therapy WFL for tasks assessed/performed           Past Medical History:  Diagnosis Date  . Diabetes mellitus without complication (HCC)   . Hypertension   . Stroke [facility]) 07/31/2020    Past Surgical History:  Procedure Laterality Date  . CARPAL TUNNEL RELEASE  6 years ago   both hands    There were no vitals filed for this visit.   Subjective Assessment - 02/03/21 1551    Subjective Patient states she continues to have difficulty with performing walking for prolonged periods of time secondary to fatigue.    Pertinent History Recent CVA 07/30/20.  Left knee pain. Pt was seen by Allie Bossier in May 2021, who diagnosed knee OA questionable need for TKA in future, has recommended conservative management at this point. Pt had a cortizone injection in May (without avail) and is shceduled for a 'gel' injection on 6/28.    Limitations Walking;Lifting;Standing;House hold activities    How long can you sit comfortably? unlimited    How long can you stand comfortably? ~5 minutes    How long can you walk comfortably? ~2-3 minutes    Diagnostic tests Diagnosed CVA    Patient Stated Goals To be able to walk without the use of an assitive device.    Currently in Pain? No/denies    Pain Onset More than a month ago            TREATMENT Gait training Walking for 366ft without UE support up and down ramp Ambulation with focus on performing curbs and unstable surfaces outside - 521ft   Therapeutic Exercise Leg Press B LE 55# -- 2 x 20 Nustep level 6 performed for 5 min to improve LE strength Side stepping B on airex beam- 10 x 32ft   Airex Beam tandem amb on airex beam--5 x 75ft B Step ups onto curbs without UE support - x 10    Performed exercises to address LE strength    PT Education - 02/03/21 1556    Education Details form/technique with exercise    Person(s) Educated Patient    Methods Explanation;Demonstration    Comprehension Verbalized understanding;Returned demonstration            PT Short Term Goals - 08/12/20 1144      PT SHORT TERM GOAL #1   Title Patient will demonstrate independence with HEP to maximize rehab potential.    Time 4    Period Weeks    Status New    Target Date 09/09/20             PT Long Term Goals - 01/20/21 1024      PT LONG TERM GOAL #1   Title Patient will demonstrate  independence with progressive HEP to maintain and progress improvements achieved during therapy.    Baseline moderate cueing with exercise; 1/20/2022minimal cueing to perform    Time 8    Period Weeks    Status Achieved      PT LONG TERM GOAL #2   Title Patient will have a FOTO score of >68/100 to show an increase in independence while performing functional daily activities.    Baseline 08/12/20: 49; 10/14/2020: 52; 12/04/2020; 51; 01/20/2021: 46    Time 8    Period Weeks    Status On-going      PT LONG TERM GOAL #3   Title Patient will demonstrate improved facility and steadiness in ambulation while performing at a gait speed of >0.51m/s.    Baseline 08/12/20: 0.37 m/s; 10/14/2020: .75 m/s; 12/04/2020: .827 m/s    Time 8    Period Weeks    Status Achieved      PT LONG TERM GOAL #4   Title Patient will improve 5xSTS and TUG score to under 12 sec to show an increase  in patient strength/power to decrease patients fall risk.    Baseline 08/12/20: 5xSTS: 21.5sec, TUG: 25.7sec; 10/14/20: 5xSTS: 12.6sec, TUG: 15.7sec; 12/05/2019: 5xSTS: 11.25 sec; TUG: 11.75 sec    Time 8    Period Weeks    Status Achieved      PT LONG TERM GOAL #5   Title Patient will be able to reach up to grab items out of her cabinets with R UE at home to show an improvement in her strength and ROM to perform task independently.    Baseline 90 Degrees of Flexion due to weakness/unable to reach up to cabinet; 12/04/2020: Full AROM    Time 8    Period Weeks    Status Achieved      PT LONG TERM GOAL #6   Title Patient will improve to over 1350 ft to indicate significant improvment in cardiovascular endurance.    Baseline 864ft; 01/20/2021: 776ft    Time 6    Period Weeks    Status On-going                 Plan - 02/03/21 1632    Clinical Impression Statement Patient demonstrates no difficulty or requirement of guarding to perform step ups onto curbs from 6-8" in height. She continues to report subjective limitations however this is not realized by functional performance. Patient is improving overall however continues ambulate slowly. Spent significant time educating on performing exercise at home. Patient will benefit form furhter skilled therapy to return to prior level of function.    Personal Factors and Comorbidities Age;Behavior Pattern;Fitness;Transportation;Time since onset of injury/illness/exacerbation    Examination-Activity Limitations Toileting;Dressing;Sit;Transfers;Bed Mobility;Lift;Bend;Squat;Locomotion Level;Stairs;Carry;Stand;Reach Overhead    Examination-Participation Restrictions Yard Work;Community Activity;Shop;Driving    Stability/Clinical Decision Making Stable/Uncomplicated    Clinical Decision Making Moderate    Rehab Potential Good    PT Frequency 2x / week    PT Duration 8 weeks    PT Treatment/Interventions ADLs/Self Care Home  Management;Cryotherapy;Electrical Stimulation;Moist Heat;DME Instruction;Gait training;Stair training;Functional mobility training;Therapeutic activities;Therapeutic exercise;Balance training;Neuromuscular re-education;Patient/family education;Manual techniques;Passive range of motion;Dry needling;Taping;Energy conservation;Ultrasound    PT Next Visit Plan Exercises to increase R strength, Gait with SPC    PT Home Exercise Plan STS and walking with AD    Consulted and Agree with Plan of Care Patient           Patient will benefit from skilled therapeutic intervention in order to improve  the following deficits and impairments:  Decreased endurance,Decreased mobility,Abnormal gait,Difficulty walking,Decreased range of motion,Obesity,Decreased activity tolerance,Decreased knowledge of use of DME,Decreased strength,Postural dysfunction,Pain,Improper body mechanics,Hypomobility,Decreased balance,Impaired perceived functional ability,Decreased safety awareness  Visit Diagnosis: Muscle weakness (generalized)  Difficulty in walking, not elsewhere classified  Chronic pain of left knee     Problem List Patient Active Problem List   Diagnosis Date Noted  . Protrusion of thoracic intervertebral disc 08/02/2020  . CVA (cerebral vascular accident) (HCC) 07/31/2020  . Urinary frequency 05/21/2020  . Unilateral primary osteoarthritis, left knee 03/17/2020  . Left knee pain 12/11/2019  . Swelling of lower leg 12/04/2019  . Pain and swelling of lower leg, left 12/04/2019  . Insomnia 08/28/2019  . 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 HTN (hypertension) 10/22/2015  . Type 1 diabetes (HCC) 10/22/2015  . Anemia 10/22/2015  . Restless legs 10/22/2015  . Hematuria 04/09/2015    Myrene Galas, PT DPT 02/03/2021, 4:46 PM  [facility] PHYSICAL AND SPORTS MEDICINE [address], [place], [address] Phone:  [phone]   Fax:  [phone]  Name: MICHAELAH CREDEUR MRN: [id number] Date of Birth: [birth year]

## 2021-02-12 ENCOUNTER — Ambulatory Visit: Payer: No Typology Code available for payment source

## 2021-02-16 ENCOUNTER — Other Ambulatory Visit: Payer: Self-pay

## 2021-02-17 ENCOUNTER — Other Ambulatory Visit: Payer: Self-pay | Admitting: Gerontology

## 2021-02-17 ENCOUNTER — Ambulatory Visit: Payer: No Typology Code available for payment source

## 2021-02-17 ENCOUNTER — Other Ambulatory Visit: Payer: Self-pay

## 2021-02-17 DIAGNOSIS — F411 Generalized anxiety disorder: Secondary | ICD-10-CM

## 2021-02-17 MED ORDER — MIRTAZAPINE 30 MG PO TABS
30.0000 mg | ORAL_TABLET | Freq: Every day | ORAL | 2 refills | Status: DC
  Filled 2021-02-17: qty 30, 30d supply, fill #0
  Filled 2021-03-30: qty 30, 30d supply, fill #1
  Filled 2021-05-08: qty 30, 30d supply, fill #2

## 2021-02-17 MED ORDER — CYMBALTA 60 MG PO CPEP
ORAL_CAPSULE | Freq: Every morning | ORAL | 2 refills | Status: DC
  Filled 2021-02-17: qty 30, 30d supply, fill #0
  Filled 2021-03-30: qty 30, 30d supply, fill #1
  Filled 2021-05-08: qty 30, 30d supply, fill #2

## 2021-02-18 ENCOUNTER — Other Ambulatory Visit: Payer: Self-pay

## 2021-02-18 MED FILL — Gabapentin Cap 300 MG: ORAL | 30 days supply | Qty: 210 | Fill #0 | Status: AC

## 2021-02-18 MED FILL — Glucose Blood Test Strip: 30 days supply | Qty: 100 | Fill #0 | Status: AC

## 2021-02-19 ENCOUNTER — Other Ambulatory Visit: Payer: Self-pay

## 2021-02-23 ENCOUNTER — Other Ambulatory Visit: Payer: Self-pay

## 2021-02-24 ENCOUNTER — Telehealth: Payer: Self-pay | Admitting: Licensed Clinical Social Worker

## 2021-02-24 ENCOUNTER — Encounter: Payer: Self-pay | Admitting: Licensed Clinical Social Worker

## 2021-02-24 ENCOUNTER — Ambulatory Visit: Payer: Self-pay | Admitting: Licensed Clinical Social Worker

## 2021-02-24 NOTE — Progress Notes (Signed)
This encounter was created in error - please disregard.

## 2021-02-24 NOTE — Telephone Encounter (Signed)
Called the patient twice during today's scheduled appointment; no answer, left a voicemail with the clinic contact information.

## 2021-02-25 ENCOUNTER — Ambulatory Visit: Payer: Self-pay | Admitting: Licensed Clinical Social Worker

## 2021-02-26 ENCOUNTER — Ambulatory Visit: Payer: No Typology Code available for payment source | Attending: Gerontology

## 2021-02-26 ENCOUNTER — Other Ambulatory Visit: Payer: Self-pay

## 2021-02-26 DIAGNOSIS — R262 Difficulty in walking, not elsewhere classified: Secondary | ICD-10-CM | POA: Insufficient documentation

## 2021-02-26 DIAGNOSIS — M6281 Muscle weakness (generalized): Secondary | ICD-10-CM | POA: Insufficient documentation

## 2021-02-26 DIAGNOSIS — M25562 Pain in left knee: Secondary | ICD-10-CM | POA: Insufficient documentation

## 2021-02-26 DIAGNOSIS — G8929 Other chronic pain: Secondary | ICD-10-CM | POA: Insufficient documentation

## 2021-02-26 DIAGNOSIS — M25662 Stiffness of left knee, not elsewhere classified: Secondary | ICD-10-CM | POA: Insufficient documentation

## 2021-02-26 NOTE — Therapy (Signed)
[facility]TS [facility] [address] [place], [address] Phone: [phone]   Fax:  [phone]  Physical Therapy Treatment  Patient Details  Name: Courtney Stafford MRN: [id number] Date of Birth: 07-18-1971 Referring Provider (PT): Lewie Chamber, MD   Encounter Date: 02/26/2021   PT End of Session - 02/26/21 1007    Visit Number 23    Number of Visits 30    Date for PT Re-Evaluation 04/04/21    Authorization Type [facility]    Authorization Time Period 01/20/21-04/14/21    PT Start Time 0940    PT Stop Time 1025    PT Time Calculation (min) 45 min    Equipment Utilized During Treatment Gait belt    Activity Tolerance Patient tolerated treatment well;No increased pain    Behavior During Therapy WFL for tasks assessed/performed           Past Medical History:  Diagnosis Date  . Diabetes mellitus without complication (HCC)   . Hypertension   . Stroke [facility]) 07/31/2020    Past Surgical History:  Procedure Laterality Date  . CARPAL TUNNEL RELEASE  6 years ago   both hands    There were no vitals filed for this visit.   Subjective Assessment - 02/26/21 0951    Subjective Pt doing well in general, no falls since last session. No medication changes. Pt reports 4x daily CBG monitoring, typically 120s.    Pertinent History Recent CVA 07/30/20.  Left knee pain. Pt was seen by Allie Bossier in May 2021, who diagnosed knee OA questionable need for TKA in future, has recommended conservative management at this point. Pt had a cortizone injection in May (without avail) and is shceduled for a 'gel' injection on 6/28.    Limitations Walking;Lifting;Standing;House hold activities    How long can you sit comfortably? unlimited    How long can you stand comfortably? ~20 minutes    How long can you walk comfortably? 9 minutes    Diagnostic tests Diagnosed CVA    Patient Stated Goals To be able to walk without the use of  an assitive device.    Currently in Pain? No/denies           INTERVENTION THIS DATE: Continuous overground AMB c SPC in RUE (modified independent level assistance)  -486ft CCW with alternate 2-point/3-point pattern (often sequences SPC/RUE with LLE when in 2-point)  *SPC adjustment up 1 spring-pin hole  -494ft CW, reverts to consistent 2-point pattern (as described above) without any cues *pt reports improved ergonomics and utility of the SPC in gait, denies any overt fatigue or new pain  *hemiweakness in gait is stable over 8 minutes 24 seconds (0.78m/s)   Overground AMB c SPC outside, minGuard assist -up/down curb c SPC 4x each, verbal cues for sequence correction to [facility] 1st prior to stepping (no LOB)  -AMB on grass, slight uphill and downhill, 1x144ft (no LOB)  -AMB on soft hardwood mulch, slight inversion/eversion, 1x128ft  -Forward downhill AMB, firm surface, downhill x44ft -Backward uphill AMB, firm surface, downhill x96ft -Left lateral stepping downhill, firm surface x80ft -Right lateral stepping uphill, firm surface x63ft  -Right lateral stepping downhill, firm surface x77ft -Left lateral stepping uphill, firm surface x28ft -tower climb (deferred to later session)    PT Short Term Goals - 08/12/20 1144      PT SHORT TERM GOAL #1   Title Patient will demonstrate independence with HEP to maximize rehab potential.  Time 4    Period Weeks    Status New    Target Date 09/09/20             PT Long Term Goals - 01/20/21 1024      PT LONG TERM GOAL #1   Title Patient will demonstrate independence with progressive HEP to maintain and progress improvements achieved during therapy.    Baseline moderate cueing with exercise; 1/20/2022minimal cueing to perform    Time 8    Period Weeks    Status Achieved      PT LONG TERM GOAL #2   Title Patient will have a FOTO score of >68/100 to show an increase in independence while performing functional daily activities.     Baseline 08/12/20: 49; 10/14/2020: 52; 12/04/2020; 51; 01/20/2021: 46    Time 8    Period Weeks    Status On-going      PT LONG TERM GOAL #3   Title Patient will demonstrate improved facility and steadiness in ambulation while performing at a gait speed of >0.92m/s.    Baseline 08/12/20: 0.37 m/s; 10/14/2020: .75 m/s; 12/04/2020: .827 m/s    Time 8    Period Weeks    Status Achieved      PT LONG TERM GOAL #4   Title Patient will improve 5xSTS and TUG score to under 12 sec to show an increase in patient strength/power to decrease patients fall risk.    Baseline 08/12/20: 5xSTS: 21.5sec, TUG: 25.7sec; 10/14/20: 5xSTS: 12.6sec, TUG: 15.7sec; 12/05/2019: 5xSTS: 11.25 sec; TUG: 11.75 sec    Time 8    Period Weeks    Status Achieved      PT LONG TERM GOAL #5   Title Patient will be able to reach up to grab items out of her cabinets with R UE at home to show an improvement in her strength and ROM to perform task independently.    Baseline 90 Degrees of Flexion due to weakness/unable to reach up to cabinet; 12/04/2020: Full AROM    Time 8    Period Weeks    Status Achieved      PT LONG TERM GOAL #6   Title Patient will improve to over 1350 ft to indicate significant improvment in cardiovascular endurance.    Baseline 84ft; 01/20/2021: 732ft    Time 6    Period Weeks    Status On-going                 Plan - 02/26/21 1032    Clinical Impression Statement Continued with overground gait training with SPC, this date progressing to multi-direction, multi-surface, and varrying gradation. Over prolonged AMB, pt demonstrates stable hemiweakness defciits without fatigue related progression. Pt is methodical and careful with other scenarios, has good safety awareness, attempts safe use of SPC without SPC most of time. Author adjusts SPC height slightly, which is favorable but objectively and subjectively over 421ft as well as during step-up/down at a curb. Heavy education emphasis on the  importance of daily AMB at home to progress her ability to perform community distances in a safe, ergonomic, and -speed appropriate manner. Discussed safety recommendations. Pt conitnues to progress toward treatment goals in general, has remained limited in progress due to only making 3-4 visits/month, however has already achieved many of her long term goals. Will continue to progress as able to maximize independence and safety in performance of IADL and community distance AMB.    Personal Factors and Comorbidities Age;Behavior Pattern;Fitness;Transportation;Time since  onset of injury/illness/exacerbation    Examination-Activity Limitations Toileting;Dressing;Sit;Transfers;Bed Mobility;Lift;Bend;Squat;Locomotion Level;Stairs;Carry;Stand;Reach Overhead    Examination-Participation Restrictions Yard Work;Community Activity;Shop;Driving    Stability/Clinical Decision Making Stable/Uncomplicated    Clinical Decision Making Moderate    Rehab Potential Good    PT Frequency 2x / week    PT Duration 8 weeks    PT Treatment/Interventions ADLs/Self Care Home Management;Cryotherapy;Electrical Stimulation;Moist Heat;DME Instruction;Gait training;Stair training;Functional mobility training;Therapeutic activities;Therapeutic exercise;Balance training;Neuromuscular re-education;Patient/family education;Manual techniques;Passive range of motion;Dry needling;Taping;Energy conservation;Ultrasound    PT Next Visit Plan Follow up on recent recommendations to increased AMB practice at home; continue with advanced SPC gait training as in prior cessions; consider inclusion of device-free static to dynamic balance training.    PT Home Exercise Plan STS and walking with AD, on 4/14 asked to start walking outside daily.    Consulted and Agree with Plan of Care Patient           Patient will benefit from skilled therapeutic intervention in order to improve the following deficits and impairments:  Decreased  endurance,Decreased mobility,Abnormal gait,Difficulty walking,Decreased range of motion,Obesity,Decreased activity tolerance,Decreased knowledge of use of DME,Decreased strength,Postural dysfunction,Pain,Improper body mechanics,Hypomobility,Decreased balance,Impaired perceived functional ability,Decreased safety awareness  Visit Diagnosis: Muscle weakness (generalized)  Difficulty in walking, not elsewhere classified  Chronic pain of left knee  Stiffness of left knee, not elsewhere classified     Problem List Patient Active Problem List   Diagnosis Date Noted  . Protrusion of thoracic intervertebral disc 08/02/2020  . CVA (cerebral vascular accident) (HCC) 07/31/2020  . Urinary frequency 05/21/2020  . Unilateral primary osteoarthritis, left knee 03/17/2020  . Left knee pain 12/11/2019  . Swelling of lower leg 12/04/2019  . Pain and swelling of lower leg, left 12/04/2019  . Insomnia 08/28/2019  . 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 HTN (hypertension) 10/22/2015  . Type 1 diabetes (HCC) 10/22/2015  . Anemia 10/22/2015  . Restless legs 10/22/2015  . Hematuria 04/09/2015   10:54 AM, 02/26/21 Rosamaria Lints, PT, DPT Physical Therapist - [place] [phone] (Office)   Jessyca Sloan C 02/26/2021, 10:39 AM  [place] [facility] PHYSICAL AND SPORTS MEDICINE [address] [place], [address] Phone: [phone]   Fax:  [phone]  Name: FALLYNN GRAVETT MRN: [id number] Date of Birth: August 06, 1971

## 2021-03-03 ENCOUNTER — Ambulatory Visit: Payer: No Typology Code available for payment source

## 2021-03-03 ENCOUNTER — Other Ambulatory Visit: Payer: Self-pay

## 2021-03-03 DIAGNOSIS — M6281 Muscle weakness (generalized): Secondary | ICD-10-CM

## 2021-03-03 DIAGNOSIS — R262 Difficulty in walking, not elsewhere classified: Secondary | ICD-10-CM

## 2021-03-03 NOTE — Therapy (Signed)
[place] [facility] PHYSICAL AND SPORTS MEDICINE [address] [place], [address] Phone: [phone]   Fax:  [phone]  Physical Therapy Treatment  Patient Details  Name: Courtney Stafford MRN: [id number] Date of Birth: 05-Oct-1971 Referring Provider (PT): Lewie Chamber, MD   Encounter Date: 03/03/2021   PT End of Session - 03/03/21 1601    Visit Number 24    Number of Visits 30    Date for PT Re-Evaluation 04/04/21    Authorization Type [facility]    Authorization Time Period 01/20/21-04/14/21    PT Start Time 1432    PT Stop Time 1517    PT Time Calculation (min) 45 min    Equipment Utilized During Treatment Gait belt    Activity Tolerance Patient tolerated treatment well;No increased pain    Behavior During Therapy WFL for tasks assessed/performed           Past Medical History:  Diagnosis Date  . Diabetes mellitus without complication (HCC)   . Hypertension   . Stroke [facility]) 07/31/2020    Past Surgical History:  Procedure Laterality Date  . CARPAL TUNNEL RELEASE  6 years ago   both hands    There were no vitals filed for this visit.   Subjective Assessment - 03/03/21 1559    Subjective Pt reports no falls. The raising of the [facility] from previous session has been helpful.    Pertinent History Recent CVA 07/30/20.  Left knee pain. Pt was seen by Allie Bossier in May 2021, who diagnosed knee OA questionable need for TKA in future, has recommended conservative management at this point. Pt had a cortizone injection in May (without avail) and is shceduled for a 'gel' injection on 6/28.    Limitations Walking;Lifting;Standing;House hold activities    How long can you sit comfortably? unlimited    How long can you stand comfortably? ~20 minutes    How long can you walk comfortably? 9 minutes    Diagnostic tests Diagnosed CVA    Patient Stated Goals To be able to walk without the use of an assitive device.    Currently in Pain?  No/denies          Gait Training with SPC:   Outside walking tasks along 100' stretch of side walk.    Forward/backward walking up hill/ down hill CGA x2/each. VC for improving step lengths to reduce step to pattern. Mod VC's for sequencing with SPC with backwards step    Amb over curbs and on mulch for unstable surface: 50' CGA. VC for increasing swing phase of gait for improve foot clearance to reduce risk of falls.  Obstacle course: Stepping over 6" step --> cone weaves (x4) --> 1/2 bolster step over --> alternating cone taps (x5/LE): x4. PT demo and VC's for safe sequencing and SPC use with obstacles. CGA. Difficulty with 1/2 bolster step over with appropriate SPC sequencing.   Progressed to 4x4 hurdle step overs to improve sequencing SPC. CGA. After PT demo pt able to perform with excellent form and sequencing.  Lateral side stepping over hurdles with SPC: 4x4 hurdles. CGA.   2x100' CW and 2x100' CCW for endurance with walking tasks. SBA   2x100' with changes in gait speed (fast/slow). SBA. No significant changes in transitions of gait speed but no LOB or sway.   PT Education - 03/03/21 1600    Education Details sequencing and gait with SPC.    Person(s) Educated Patient    Methods Explanation;Demonstration  Comprehension Verbalized understanding;Returned demonstration            PT Short Term Goals - 08/12/20 1144      PT SHORT TERM GOAL #1   Title Patient will demonstrate independence with HEP to maximize rehab potential.    Time 4    Period Weeks    Status New    Target Date 09/09/20             PT Long Term Goals - 01/20/21 1024      PT LONG TERM GOAL #1   Title Patient will demonstrate independence with progressive HEP to maintain and progress improvements achieved during therapy.    Baseline moderate cueing with exercise; 1/20/2022minimal cueing to perform    Time 8    Period Weeks    Status Achieved      PT LONG TERM GOAL #2   Title Patient will have a  FOTO score of >68/100 to show an increase in independence while performing functional daily activities.    Baseline 08/12/20: 49; 10/14/2020: 52; 12/04/2020; 51; 01/20/2021: 46    Time 8    Period Weeks    Status On-going      PT LONG TERM GOAL #3   Title Patient will demonstrate improved facility and steadiness in ambulation while performing at a gait speed of >0.28m/s.    Baseline 08/12/20: 0.37 m/s; 10/14/2020: .75 m/s; 12/04/2020: .827 m/s    Time 8    Period Weeks    Status Achieved      PT LONG TERM GOAL #4   Title Patient will improve 5xSTS and TUG score to under 12 sec to show an increase in patient strength/power to decrease patients fall risk.    Baseline 08/12/20: 5xSTS: 21.5sec, TUG: 25.7sec; 10/14/20: 5xSTS: 12.6sec, TUG: 15.7sec; 12/05/2019: 5xSTS: 11.25 sec; TUG: 11.75 sec    Time 8    Period Weeks    Status Achieved      PT LONG TERM GOAL #5   Title Patient will be able to reach up to grab items out of her cabinets with R UE at home to show an improvement in her strength and ROM to perform task independently.    Baseline 90 Degrees of Flexion due to weakness/unable to reach up to cabinet; 12/04/2020: Full AROM    Time 8    Period Weeks    Status Achieved      PT LONG TERM GOAL #6   Title Patient will improve to over 1350 ft to indicate significant improvment in cardiovascular endurance.    Baseline 858ft; 01/20/2021: 795ft    Time 6    Period Weeks    Status On-going                 Plan - 03/03/21 1602    Clinical Impression Statement Progressed gait training with SPC with changes in gait speed and sequencing with safe asc/desc curbs and other obstacles. Pt displayed safe technique with SPC sequencing after mod VC's and PT demo. Most difficulty with sequencing SPC with hurdle step overs with elevating SPC with RLE (Carries SPC in RUE) leading to single point of contact instead of two points of contact leading to sway. After PT demo and practice pt only  required supervision with sequencing hurdle step overs. Pt can continue to benefit from skilled PT services to progress safe mobility with [facility] with community tasks to reduce risk of falls.    Personal Factors and Comorbidities Age;Behavior Pattern;Fitness;Transportation;Time since onset  of injury/illness/exacerbation    Examination-Activity Limitations Toileting;Dressing;Sit;Transfers;Bed Mobility;Lift;Bend;Squat;Locomotion Level;Stairs;Carry;Stand;Reach Overhead    Examination-Participation Restrictions Yard Work;Community Activity;Shop;Driving    Stability/Clinical Decision Making Stable/Uncomplicated    Rehab Potential Good    PT Frequency 2x / week    PT Duration 8 weeks    PT Treatment/Interventions ADLs/Self Care Home Management;Cryotherapy;Electrical Stimulation;Moist Heat;DME Instruction;Gait training;Stair training;Functional mobility training;Therapeutic activities;Therapeutic exercise;Balance training;Neuromuscular re-education;Patient/family education;Manual techniques;Passive range of motion;Dry needling;Taping;Energy conservation;Ultrasound    PT Next Visit Plan device-free static to dynamic balance training.    PT Home Exercise Plan STS and walking with AD, on 4/14 asked to start walking outside daily.    Consulted and Agree with Plan of Care Patient           Patient will benefit from skilled therapeutic intervention in order to improve the following deficits and impairments:  Decreased endurance,Decreased mobility,Abnormal gait,Difficulty walking,Decreased range of motion,Obesity,Decreased activity tolerance,Decreased knowledge of use of DME,Decreased strength,Postural dysfunction,Pain,Improper body mechanics,Hypomobility,Decreased balance,Impaired perceived functional ability,Decreased safety awareness  Visit Diagnosis: Muscle weakness (generalized)  Difficulty in walking, not elsewhere classified     Problem List Patient Active Problem List   Diagnosis Date Noted  .  Protrusion of thoracic intervertebral disc 08/02/2020  . CVA (cerebral vascular accident) (HCC) 07/31/2020  . Urinary frequency 05/21/2020  . Unilateral primary osteoarthritis, left knee 03/17/2020  . Left knee pain 12/11/2019  . Swelling of lower leg 12/04/2019  . Pain and swelling of lower leg, left 12/04/2019  . Insomnia 08/28/2019  . 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 HTN (hypertension) 10/22/2015  . Type 1 diabetes (HCC) 10/22/2015  . Anemia 10/22/2015  . Restless legs 10/22/2015  . Hematuria 04/09/2015    Delphia Grates. Fairly IV, PT, DPT Physical Therapist- [place]  [facility]  03/03/2021, 4:15 PM  [place] [facility] PHYSICAL AND SPORTS MEDICINE [address] [place], [address] Phone: [phone]   Fax:  [phone]  Name: Courtney Stafford MRN: [id number] Date of Birth: Mar 27, 1971

## 2021-03-10 ENCOUNTER — Ambulatory Visit: Payer: Self-pay | Admitting: Licensed Clinical Social Worker

## 2021-03-10 ENCOUNTER — Other Ambulatory Visit: Payer: Self-pay

## 2021-03-10 ENCOUNTER — Ambulatory Visit: Payer: Self-pay

## 2021-03-10 DIAGNOSIS — F331 Major depressive disorder, recurrent, moderate: Secondary | ICD-10-CM

## 2021-03-10 DIAGNOSIS — M6281 Muscle weakness (generalized): Secondary | ICD-10-CM

## 2021-03-10 DIAGNOSIS — F411 Generalized anxiety disorder: Secondary | ICD-10-CM

## 2021-03-10 DIAGNOSIS — R262 Difficulty in walking, not elsewhere classified: Secondary | ICD-10-CM

## 2021-03-10 NOTE — Therapy (Signed)
[place] [facility] PHYS[facility]ORTS MEDICINE [address] [place], [address] Phone: [phone]   Fax:  [phone]  Physical Therapy Treatment  Patient Details  Name: Courtney Stafford MRN: [id number] Date of Birth: 11/09/1971 Referring Provider (PT): Lewie Chamber, MD   Encounter Date: 03/10/2021   PT End of Session - 03/10/21 1422    Visit Number 25    Number of Visits 30    Date for PT Re-Evaluation 04/04/21    Authorization Type [facility]    Authorization Time Period 01/20/21-04/14/21    PT Start Time 1431    PT Stop Time 1515    PT Time Calculation (min) 44 min    Equipment Utilized During Treatment Gait belt    Activity Tolerance Patient tolerated treatment well;No increased pain    Behavior During Therapy WFL for tasks assessed/performed           Past Medical History:  Diagnosis Date  . Diabetes mellitus without complication (HCC)   . Hypertension   . Stroke [facility]) 07/31/2020    Past Surgical History:  Procedure Laterality Date  . CARPAL TUNNEL RELEASE  6 years ago   both hands    There were no vitals filed for this visit.   Subjective Assessment - 03/10/21 1543    Subjective Pt denies falls. Walking in apartment and short outdoor distances with no SPC.    Pertinent History Recent CVA 07/30/20.  Left knee pain. Pt was seen by Allie Bossier in May 2021, who diagnosed knee OA questionable need for TKA in future, has recommended conservative management at this point. Pt had a cortizone injection in May (without avail) and is shceduled for a 'gel' injection on 6/28.    Limitations Walking;Lifting;Standing;House hold activities    How long can you sit comfortably? unlimited    How long can you stand comfortably? ~20 minutes    How long can you walk comfortably? 9 minutes    Diagnostic tests Diagnosed CVA    Patient Stated Goals To be able to walk without the use of an assitive device.    Currently in Pain?  No/denies           Gait Training:    SPC with 4 hurdle step overs for safe SPC sequencing. x2 with good carryover from previous session. CGA. 1 bout of needed VC to correct sequencing.   SPC 4 square CW/CCW: x3/direction. Good carryover from previous session with safe sequencing with SPC  SPC sequencing with varying height step up and overs: blue bolster --> 8" step --> narrow 6" step. x2 with good form/technique. Decreased eccentric control with stepping down over 8" step.     Ambulating around gym 100' to assess gait with no AD. Antalgic gait on RLE with decreased stance time and R hip abduction during swing phase of gait.    4 square step overs with no AD. x3 CCW, x3 CW. No LOB.    10 m distance with no AD and CGA from PT:    Changes in gait speed (fast <--> slow) x2 10 m distance. 100' loop around gym x2.    Stop and go x2    Vertical head turns x2    Horizontal head turns x2    Intermittent, variable step lengths throughout gait challenges with no AD. No LOB.     There.ex:   STS with airex pad under single LE to place increased challenge to single LE. X12/LE.   STS with  B feet on airex pad: x12  Side stepping to R/L, x2/direction. CGA. Pt reports fatigue in lateral hip musculature.   PT Education - 03/10/21 1421    Education Details gait and balance exercises.    Person(s) Educated Patient    Methods Explanation;Demonstration;Tactile cues;Verbal cues    Comprehension Verbalized understanding;Returned demonstration            PT Short Term Goals - 08/12/20 1144      PT SHORT TERM GOAL #1   Title Patient will demonstrate independence with HEP to maximize rehab potential.    Time 4    Period Weeks    Status New    Target Date 09/09/20             PT Long Term Goals - 01/20/21 1024      PT LONG TERM GOAL #1   Title Patient will demonstrate independence with progressive HEP to maintain and progress improvements achieved during therapy.    Baseline moderate  cueing with exercise; 1/20/2022minimal cueing to perform    Time 8    Period Weeks    Status Achieved      PT LONG TERM GOAL #2   Title Patient will have a FOTO score of >68/100 to show an increase in independence while performing functional daily activities.    Baseline 08/12/20: 49; 10/14/2020: 52; 12/04/2020; 51; 01/20/2021: 46    Time 8    Period Weeks    Status On-going      PT LONG TERM GOAL #3   Title Patient will demonstrate improved facility and steadiness in ambulation while performing at a gait speed of >0.19m/s.    Baseline 08/12/20: 0.37 m/s; 10/14/2020: .75 m/s; 12/04/2020: .827 m/s    Time 8    Period Weeks    Status Achieved      PT LONG TERM GOAL #4   Title Patient will improve 5xSTS and TUG score to under 12 sec to show an increase in patient strength/power to decrease patients fall risk.    Baseline 08/12/20: 5xSTS: 21.5sec, TUG: 25.7sec; 10/14/20: 5xSTS: 12.6sec, TUG: 15.7sec; 12/05/2019: 5xSTS: 11.25 sec; TUG: 11.75 sec    Time 8    Period Weeks    Status Achieved      PT LONG TERM GOAL #5   Title Patient will be able to reach up to grab items out of her cabinets with R UE at home to show an improvement in her strength and ROM to perform task independently.    Baseline 90 Degrees of Flexion due to weakness/unable to reach up to cabinet; 12/04/2020: Full AROM    Time 8    Period Weeks    Status Achieved      PT LONG TERM GOAL #6   Title Patient will improve to over 1350 ft to indicate significant improvment in cardiovascular endurance.    Baseline 832ft; 01/20/2021: 726ft    Time 6    Period Weeks    Status On-going                 Plan - 03/10/21 1544    Clinical Impression Statement Reinforcement of sequencing with SPC with lateral wlaking movements and obstacle navigation with good carryover from previous session with only single VC needed to correct. Progressed pt's gait training to no AD with changes in gait speed, head turns, stopping,  stopping and lateral/backwards movements. Pt required CGA for safety and displayed 2 minor LOB requiring step strategy to correct with independence.  Pt can benefit from further skilled PT treatment to progress pt to safe ambulation in community with no AD.    Personal Factors and Comorbidities Age;Behavior Pattern;Fitness;Transportation;Time since onset of injury/illness/exacerbation    Examination-Activity Limitations Toileting;Dressing;Sit;Transfers;Bed Mobility;Lift;Bend;Squat;Locomotion Level;Stairs;Carry;Stand;Reach Overhead    Examination-Participation Restrictions Yard Work;Community Activity;Shop;Driving    Stability/Clinical Decision Making Stable/Uncomplicated    Rehab Potential Good    PT Frequency 2x / week    PT Duration 8 weeks    PT Treatment/Interventions ADLs/Self Care Home Management;Cryotherapy;Electrical Stimulation;Moist Heat;DME Instruction;Gait training;Stair training;Functional mobility training;Therapeutic activities;Therapeutic exercise;Balance training;Neuromuscular re-education;Patient/family education;Manual techniques;Passive range of motion;Dry needling;Taping;Energy conservation;Ultrasound    PT Next Visit Plan device-free static to dynamic balance training.    PT Home Exercise Plan STS and walking with AD, on 4/14 asked to start walking outside daily.    Consulted and Agree with Plan of Care Patient           Patient will benefit from skilled therapeutic intervention in order to improve the following deficits and impairments:  Decreased endurance,Decreased mobility,Abnormal gait,Difficulty walking,Decreased range of motion,Obesity,Decreased activity tolerance,Decreased knowledge of use of DME,Decreased strength,Postural dysfunction,Pain,Improper body mechanics,Hypomobility,Decreased balance,Impaired perceived functional ability,Decreased safety awareness  Visit Diagnosis: Muscle weakness (generalized)  Difficulty in walking, not elsewhere  classified     Problem List Patient Active Problem List   Diagnosis Date Noted  . Protrusion of thoracic intervertebral disc 08/02/2020  . CVA (cerebral vascular accident) (HCC) 07/31/2020  . Urinary frequency 05/21/2020  . Unilateral primary osteoarthritis, left knee 03/17/2020  . Left knee pain 12/11/2019  . Swelling of lower leg 12/04/2019  . Pain and swelling of lower leg, left 12/04/2019  . Insomnia 08/28/2019  . 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 HTN (hypertension) 10/22/2015  . Type 1 diabetes (HCC) 10/22/2015  . Anemia 10/22/2015  . Restless legs 10/22/2015  . Hematuria 04/09/2015   Delphia Grates. Fairly IV, PT, DPT Physical Therapist- [place]  [facility]  03/10/2021, 4:06 PM  [place] [facility] PHYSICAL AND SPORTS MEDICINE [address] [place], [address] Phone: [phone]   Fax:  [phone]  Name: Courtney Stafford MRN: [id number] Date of Birth: 10-Sep-1971

## 2021-03-10 NOTE — BH Specialist Note (Signed)
Integrated Behavioral Health Follow Up In-Person Visit  MRN: [id number] Name: Courtney Stafford   Total time: 30 minutes  Types of Service: Telephone visit Patient consents to telephone visit and 2 patient identifiers were used to identify patient  Interpretor:No. Interpretor Name and Language:  Subjective: Courtney Stafford is a 50 y.o. female accompanied by herself  Patient was referred by Hurman Horn, NP  for mental health.  Patient reports the following symptoms/concerns: The patient reports that she is well. She stated that she has been going to physical therapy regularly and trying to practice her exercises at home. She notes that she was told her charity care may be out and she would have to pay $300.00 per visit to continue her physical therapy. She explained that she can not afford to pay and would have to stop going. She discussed financial stressors in her life. She reported she has been thinking of her late son a lot this week. She explained that he passed almost two years ago now. The patient stated that she has good nights and bad nights regarding sleep. She stated that she is unable to get a ride to the [employer] to apply for services there. She discussed health stressors. The patient denied any suicidal or homicidal thoughts.  Duration of problem: ; Severity of problem: moderate  Objective: Mood: Euthymic and Affect: Appropriate Risk of harm to self or others: No plan to harm self or others  Life Context: Family and Social: see above School/Work: see above Self-Care: see above Life Changes: see above  Patient and/or Family's Strengths/Protective Factors: Concrete supports in place (healthy food, safe environments, etc.)  Goals Addressed: Patient will: 1.  Reduce symptoms of: agitation, anxiety, depression and stress  2.  Increase knowledge and/or ability of: coping skills, healthy habits, self-management skills and stress reduction  3.  Demonstrate ability  to: Increase healthy adjustment to current life circumstances  Progress towards Goals: Ongoing  Interventions: Interventions utilized:  Supportive Counseling was utilized by the clinician during today's follow up session. The clinician processed with the patient how they have been doing since the last follow-up session. The clinician provided a space for the patient to ventilate their frustrations regarding their current life circumstances. Clinician measured the patient's anxiety and depression on a numerical scale. Clinician offered [facility]'s contact information.  Standardized Assessments completed: GAD-7 and PHQ 9  Gad-7     9 PHQ-9  15  Patient and/or Family Response:   Patient was agreeable to the plans and treatment recommendations.  Patient Centered Plan: Patient is on the following Treatment Plan(s): The patient is to follow-up with [facility] until [facility] schedules integrated behavioral health appointments again.  Assessment: Patient currently experiencing see above  Patient may benefit from see above  Plan: 1. Follow up with behavioral health clinician on :  2. Behavioral recommendations:  3. Referral(s): Integrated Art gallery manager (In Clinic) and [employer] [facility] (LME/Outside Clinic) 4. "From scale of 1-10, how likely are you to follow plan?":   Judith Part, Student-Social Work

## 2021-03-11 ENCOUNTER — Telehealth: Payer: Self-pay | Admitting: Pharmacist

## 2021-03-11 NOTE — Telephone Encounter (Signed)
03/11/2021 1:33:00 PM - Lantus vials renewal to [employer]  -- [name] - Wednesday, March 11, 2021 1:32 PM --Faxed [employer] renewal for Lantus Vials Inject 30 units daily at bedtime # 3 vials.

## 2021-03-17 ENCOUNTER — Ambulatory Visit: Payer: No Typology Code available for payment source

## 2021-03-18 ENCOUNTER — Telehealth: Payer: Self-pay | Admitting: Pharmacist

## 2021-03-18 NOTE — Telephone Encounter (Signed)
03/18/2021 9:18:31 AM - Novolog Flexpen & tips to [employer]  -- [name] - Wednesday, Mar 18, 2021 9:17 AM --[employer] refill for [name] Inject 5-7 units daily with meals (max daily 21 units), # 2 boxes, and Novofine 32G tips #4boxes.

## 2021-03-30 ENCOUNTER — Other Ambulatory Visit: Payer: Self-pay

## 2021-03-30 ENCOUNTER — Telehealth: Payer: Self-pay | Admitting: Pharmacist

## 2021-03-30 MED ORDER — NOVOLOG FLEXPEN 100 UNIT/ML ~~LOC~~ SOPN
PEN_INJECTOR | SUBCUTANEOUS | 0 refills | Status: DC
  Filled 2021-03-30 – 2021-04-29 (×2): qty 30, 143d supply, fill #0

## 2021-03-30 MED FILL — Insulin Pen Needle 32 G X 6 MM (1/4" or 15/64"): 33 days supply | Qty: 100 | Fill #0 | Status: AC

## 2021-03-30 MED FILL — Gabapentin Cap 300 MG: ORAL | 30 days supply | Qty: 210 | Fill #1 | Status: AC

## 2021-03-30 MED FILL — Atorvastatin Calcium Tab 80 MG (Base Equivalent): ORAL | 90 days supply | Qty: 90 | Fill #0 | Status: AC

## 2021-03-30 MED FILL — Glucose Blood Test Strip: 25 days supply | Qty: 100 | Fill #1 | Status: AC

## 2021-03-30 MED FILL — Insulin Pen Needle 32 G X 6 MM (1/4" or 15/64"): 200 days supply | Qty: 200 | Fill #0 | Status: CN

## 2021-03-30 NOTE — Telephone Encounter (Signed)
03/30/2021 10:10:57 AM - Novolog Flexpen&tips renewal to dr & Pt  -- [name] - Monday, Mar 30, 2021 10:07 AM --[name] received call from patient-she has received a letter from [employer] that they were unable to fill her medication. Call to Novo-patient enrollment ended 03/22/2021--Need to renew. Hotel manager for [name] & tips-put Philis Nettle portion in [facility] folder, called patient had to leave voice message to call me. I have patient portion ready to mail to her unless she wants to come by and sign.

## 2021-03-31 ENCOUNTER — Other Ambulatory Visit: Payer: Self-pay

## 2021-03-31 ENCOUNTER — Telehealth: Payer: Self-pay | Admitting: Pharmacist

## 2021-03-31 MED ORDER — RIGHTEST GL300 LANCETS MISC
3 refills | Status: DC
  Filled 2021-03-31: qty 100, 25d supply, fill #0
  Filled 2021-05-08: qty 100, 25d supply, fill #1
  Filled 2022-01-12: qty 100, 25d supply, fill #2
  Filled 2022-01-19: qty 100, 25d supply, fill #3

## 2021-03-31 NOTE — Telephone Encounter (Signed)
03/31/2021 1:46:44 PM - Novolog Flexpen & tips to [employer]  -- [name] - Tuesday, Mar 31, 2021 1:45 PM --Faxed a [employer] renewal for [name] Inject 5-7 units with meals (Max 21 units) # 2 boxes & Novofine 32G tips # 4.

## 2021-04-02 ENCOUNTER — Other Ambulatory Visit: Payer: Self-pay

## 2021-04-15 ENCOUNTER — Other Ambulatory Visit: Payer: Self-pay

## 2021-04-21 ENCOUNTER — Telehealth: Payer: Self-pay | Admitting: Licensed Clinical Social Worker

## 2021-04-21 NOTE — Telephone Encounter (Signed)
Called the patient to schedule a 60 minute  follow-up visit. No answer; left voicemail with clinic contact information   

## 2021-04-22 ENCOUNTER — Ambulatory Visit: Payer: Self-pay | Admitting: Gerontology

## 2021-04-23 ENCOUNTER — Telehealth: Payer: Self-pay | Admitting: Licensed Clinical Social Worker

## 2021-04-23 NOTE — Telephone Encounter (Signed)
Called the patient to schedule a 60 minute  follow-up visit. No answer; left voicemail with clinic contact information

## 2021-04-28 NOTE — Telephone Encounter (Signed)
Called the patient to schedule a 60 minute telephone follow-up visit. No answer; left voicemail with clinic contact information  

## 2021-04-29 ENCOUNTER — Other Ambulatory Visit: Payer: Self-pay

## 2021-04-29 MED FILL — Insulin Pen Needle 32 G X 6 MM (1/4" or 15/64"): 33 days supply | Qty: 100 | Fill #1 | Status: AC

## 2021-04-30 ENCOUNTER — Other Ambulatory Visit: Payer: Self-pay

## 2021-04-30 ENCOUNTER — Telehealth: Payer: Self-pay | Admitting: Gerontology

## 2021-04-30 NOTE — Telephone Encounter (Signed)
LVM regarding an appt scheduled with Ms. Courtney Stafford for 05/12/21 at 4 pm.

## 2021-05-08 ENCOUNTER — Other Ambulatory Visit: Payer: Self-pay

## 2021-05-08 ENCOUNTER — Other Ambulatory Visit: Payer: Self-pay | Admitting: Gerontology

## 2021-05-08 DIAGNOSIS — G2581 Restless legs syndrome: Secondary | ICD-10-CM

## 2021-05-08 DIAGNOSIS — I10 Essential (primary) hypertension: Secondary | ICD-10-CM

## 2021-05-08 MED ORDER — HYDRALAZINE HCL 25 MG PO TABS
ORAL_TABLET | Freq: Two times a day (BID) | ORAL | 2 refills | Status: DC
  Filled 2021-05-08: qty 60, 30d supply, fill #0
  Filled 2021-10-13: qty 60, 30d supply, fill #1
  Filled 2021-12-09: qty 60, 30d supply, fill #2

## 2021-05-08 MED ORDER — AMLODIPINE BESYLATE 10 MG PO TABS
ORAL_TABLET | Freq: Every day | ORAL | 0 refills | Status: DC
  Filled 2021-05-08: qty 90, 90d supply, fill #0

## 2021-05-08 MED ORDER — GABAPENTIN 300 MG PO CAPS
ORAL_CAPSULE | ORAL | 1 refills | Status: DC
  Filled 2021-05-08: qty 210, 30d supply, fill #0
  Filled 2021-10-13 – 2021-10-16 (×4): qty 210, 30d supply, fill #1

## 2021-05-08 MED FILL — Glucose Blood Test Strip: 25 days supply | Qty: 100 | Fill #2 | Status: AC

## 2021-05-08 MED FILL — Carvedilol Tab 12.5 MG: ORAL | 30 days supply | Qty: 60 | Fill #0 | Status: AC

## 2021-05-08 MED FILL — Hydrochlorothiazide Tab 25 MG: ORAL | 90 days supply | Qty: 90 | Fill #0 | Status: AC

## 2021-05-08 MED FILL — Lisinopril Tab 40 MG: ORAL | 90 days supply | Qty: 90 | Fill #0 | Status: AC

## 2021-05-11 ENCOUNTER — Other Ambulatory Visit: Payer: Self-pay

## 2021-05-12 ENCOUNTER — Ambulatory Visit: Payer: Self-pay | Admitting: Licensed Clinical Social Worker

## 2021-05-12 ENCOUNTER — Other Ambulatory Visit: Payer: Self-pay

## 2021-05-12 DIAGNOSIS — F4310 Post-traumatic stress disorder, unspecified: Secondary | ICD-10-CM

## 2021-05-12 NOTE — BH Specialist Note (Signed)
Integrated Behavioral Health Follow Up In-Person Visit  MRN: [id number] Name: Courtney Stafford   Total time: 60 minutes  Types of Service: Telephone visit  Patient consents to telephone visit and 2 patient identifiers were used to identify patient   Interpretor:No. Interpretor Name and Language:   Subjective: Courtney Stafford is a 50 y.o. female accompanied by  herself Patient was referred by Hurman Horn, NP for mental health. Patient reports the following symptoms/concerns: The patient reports that she has been doing about the same since her last follow-up visit. She apologized for missing her appointments. She explained that her cell phone was broken and she just received a new phone in the mail this week. She asked for the telephone number to the [facility], [employer] ([place]), and to her physical therapist office. She reported that all her contacts were lost. She explained that she has not been going to physical therapy for several weeks now. She stated that she was asked not to return because she does not have charity care anymore. She stated that she is not able to write well enough especially after her stroke to complete the application. She said usually someone at the [facility] would help her fill out the application and she would sign it. She stated this time they handed her a blank application and she did not know what to do. She noted that she is very concerned that if she does not go to her physical therapy appointments she won't make a full recovery from her stroke. She requested a transportation request be sent in so she can make it to her appointment next week. She explained that she is unable to walk to the bus stop and has no one else she can call to give her a ride. The patient denied any suicidal or homicidal thoughts.  Duration of problem: Years; Severity of problem: moderate  Objective: Mood: Euthymic and Affect: Appropriate Risk of harm  to self or others: No plan to harm self or others  Life Context: Family and Social: see above School/Work: see above Self-Care: see above Life Changes: see above  Patient and/or Family's Strengths/Protective Factors: Concrete supports in place (healthy food, safe environments, etc.)  Goals Addressed: Patient will:  Reduce symptoms of: agitation, anxiety, depression, insomnia, and stress   Increase knowledge and/or ability of: coping skills, healthy habits, self-management skills, and stress reduction   Demonstrate ability to: Increase healthy adjustment to current life circumstances  Progress towards Goals: Ongoing  Interventions: Interventions utilized:  Supportive Counseling was utilized by the clinician during today's follow up session. The clinician processed with the patient how they have been doing since the last follow-up session. The clinician provided a space for the patient to ventilate their frustrations regarding their current life circumstances. Clinician measured the patient's anxiety and depression on a numerical scale. Clinician explained to the patient how the charity care application works and suggested contacting the clinic to verify what documents she will needs along with it. Clinician encouraged the patient to self- advocate for assistance with filling out the charity care application. Clinician provided the patient with the requested telephone numbers. Clinician encouraged the patient to focus on the positives rather than the negatives. Clinician sent a transportation request for patient's appointment with Hurman Horn, NP on 05/19/2021. Standardized Assessments completed: GAD-7 and PHQ 9 PHQ-9     18 GAD-7     16  Assessment: Patient currently experiencing see above.   Patient may benefit from  see above.  Plan: Follow up with behavioral health clinician on : 05/28/2021 at 9:00 AM Behavioral recommendations:  Referral(s): Integrated [employer] (In Clinic) "From scale of 1-10, how likely are you to follow plan?":   Judith Part, LCSWA

## 2021-05-19 ENCOUNTER — Ambulatory Visit: Payer: Self-pay | Admitting: Gerontology

## 2021-05-19 ENCOUNTER — Other Ambulatory Visit: Payer: Self-pay

## 2021-05-19 ENCOUNTER — Encounter: Payer: Self-pay | Admitting: Gerontology

## 2021-05-19 VITALS — BP 155/78 | HR 88 | Temp 97.5°F | Resp 16 | Ht 60.0 in | Wt 195.1 lb

## 2021-05-19 DIAGNOSIS — G8929 Other chronic pain: Secondary | ICD-10-CM

## 2021-05-19 DIAGNOSIS — I10 Essential (primary) hypertension: Secondary | ICD-10-CM

## 2021-05-19 DIAGNOSIS — E109 Type 1 diabetes mellitus without complications: Secondary | ICD-10-CM

## 2021-05-19 LAB — POCT GLYCOSYLATED HEMOGLOBIN (HGB A1C): Hemoglobin A1C: 8.3 % — AB (ref 4.0–5.6)

## 2021-05-19 MED ORDER — HYDROCHLOROTHIAZIDE 25 MG PO TABS
ORAL_TABLET | Freq: Every day | ORAL | 1 refills | Status: DC
  Filled 2021-05-19: qty 90, fill #0
  Filled 2021-10-13: qty 25, 25d supply, fill #0
  Filled 2021-12-09: qty 30, 30d supply, fill #1
  Filled 2022-01-12: qty 90, 90d supply, fill #2

## 2021-05-19 MED ORDER — CARVEDILOL 12.5 MG PO TABS
ORAL_TABLET | Freq: Two times a day (BID) | ORAL | 3 refills | Status: DC
  Filled 2021-05-19 – 2021-10-13 (×2): qty 60, fill #0
  Filled 2021-10-16: qty 60, 30d supply, fill #0
  Filled 2021-12-09: qty 60, 30d supply, fill #1

## 2021-05-19 MED ORDER — ASPIRIN 81 MG PO TBEC: 81.0000 mg | DELAYED_RELEASE_TABLET | Freq: Every day | ORAL | 11 refills | Status: DC

## 2021-05-19 MED ORDER — AMLODIPINE BESYLATE 10 MG PO TABS
ORAL_TABLET | Freq: Every day | ORAL | 1 refills | Status: DC
  Filled 2021-05-19: qty 90, fill #0
  Filled 2021-10-13: qty 90, 90d supply, fill #0

## 2021-05-19 MED ORDER — MELOXICAM 7.5 MG PO TABS: 7.5000 mg | ORAL_TABLET | Freq: Every day | ORAL | 1 refills | Status: DC | PRN

## 2021-05-19 MED ORDER — LISINOPRIL 40 MG PO TABS
ORAL_TABLET | Freq: Every day | ORAL | 1 refills | Status: DC
  Filled 2021-05-19: qty 90, fill #0
  Filled 2021-10-13: qty 90, 90d supply, fill #0
  Filled 2022-01-12: qty 90, 90d supply, fill #1

## 2021-05-19 NOTE — Patient Instructions (Signed)
https://www.diabeteseducator.org/docs/default-source/living-with-diabetes/conquering-the-grocery-store-v1.pdf?sfvrsn=4">  Carbohydrate Counting for Diabetes Mellitus, Adult Carbohydrate counting is a method of keeping track of how many carbohydrates you eat. Eating carbohydrates naturally increases the amount of sugar (glucose) in the blood. Counting how many carbohydrates you eat improves your bloodglucose control, which helps you manage your diabetes. It is important to know how many carbohydrates you can safely have in each meal. This is different for every person. A dietitian can help you make a meal plan and calculate how many carbohydrates you should have at each meal andsnack. What foods contain carbohydrates? Carbohydrates are found in the following foods: Grains, such as breads and cereals. Dried beans and soy products. Starchy vegetables, such as potatoes, peas, and corn. Fruit and fruit juices. Milk and yogurt. Sweets and snack foods, such as cake, cookies, candy, chips, and soft drinks. How do I count carbohydrates in foods? There are two ways to count carbohydrates in food. You can read food labels or learn standard serving sizes of foods. You can use either of the methods or acombination of both. Using the Nutrition Facts label The Nutrition Facts list is included on the labels of almost all packaged foods and beverages in the [place] It includes: The serving size. Information about nutrients in each serving, including the grams (g) of carbohydrate per serving. To use the Nutrition Facts: Decide how many servings you will have. Multiply the number of servings by the number of carbohydrates per serving. The resulting number is the total amount of carbohydrates that you will be having. Learning the standard serving sizes of foods When you eat carbohydrate foods that are not packaged or do not include Nutrition Facts on the label, you need to measure the servings in order to count the  amount of carbohydrates. Measure the foods that you will eat with a food scale or measuring cup, if needed. Decide how many standard-size servings you will eat. Multiply the number of servings by 15. For foods that contain carbohydrates, one serving equals 15 g of carbohydrates. For example, if you eat 2 cups or 10 oz (300 g) of strawberries, you will have eaten 2 servings and 30 g of carbohydrates (2 servings x 15 g = 30 g). For foods that have more than one food mixed, such as soups and casseroles, you must count the carbohydrates in each food that is included. The following list contains standard serving sizes of common carbohydrate-rich foods. Each of these servings has about 15 g of carbohydrates: 1 slice of bread. 1 six-inch (15 cm) tortilla. ? cup or 2 oz (53 g) cooked rice or pasta.  cup or 3 oz (85 g) cooked or canned, drained and rinsed beans or lentils.  cup or 3 oz (85 g) starchy vegetable, such as peas, corn, or squash.  cup or 4 oz (120 g) hot cereal.  cup or 3 oz (85 g) boiled or mashed potatoes, or  or 3 oz (85 g) of a large baked potato.  cup or 4 fl oz (118 mL) fruit juice. 1 cup or 8 fl oz (237 mL) milk. 1 small or 4 oz (106 g) apple.  or 2 oz (63 g) of a medium banana. 1 cup or 5 oz (150 g) strawberries. 3 cups or 1 oz (24 g) popped popcorn. What is an example of carbohydrate counting? To calculate the number of carbohydrates in this sample meal, follow the stepsshown below. Sample meal 3 oz (85 g) chicken breast. ? cup or 4 oz (106 g) brown rice.    cup or 3 oz (85 g) corn. 1 cup or 8 fl oz (237 mL) milk. 1 cup or 5 oz (150 g) strawberries with sugar-free whipped topping. Carbohydrate calculation Identify the foods that contain carbohydrates: Rice. Corn. Milk. Strawberries. Calculate how many servings you have of each food: 2 servings rice. 1 serving corn. 1 serving milk. 1 serving strawberries. Multiply each number of servings by 15 g: 2 servings  rice x 15 g = 30 g. 1 serving corn x 15 g = 15 g. 1 serving milk x 15 g = 15 g. 1 serving strawberries x 15 g = 15 g. Add together all of the amounts to find the total grams of carbohydrates eaten: 30 g + 15 g + 15 g + 15 g = 75 g of carbohydrates total. What are tips for following this plan? Shopping Develop a meal plan and then make a shopping list. Buy fresh and frozen vegetables, fresh and frozen fruit, dairy, eggs, beans, lentils, and whole grains. Look at food labels. Choose foods that have more fiber and less sugar. Avoid processed foods and foods with added sugars. Meal planning Aim to have the same amount of carbohydrates at each meal and for each snack time. Plan to have regular, balanced meals and snacks. Where to find more information American Diabetes Association: www.diabetes.org Centers for Disease Control and Prevention: www.cdc.gov Summary Carbohydrate counting is a method of keeping track of how many carbohydrates you eat. Eating carbohydrates naturally increases the amount of sugar (glucose) in the blood. Counting how many carbohydrates you eat improves your blood glucose control, which helps you manage your diabetes. A dietitian can help you make a meal plan and calculate how many carbohydrates you should have at each meal and snack. This information is not intended to replace advice given to you by your health care provider. Make sure you discuss any questions you have with your healthcare provider. Document Revised: 11/01/2019 Document Reviewed: 11/02/2019 Elsevier Patient Education  2021 Elsevier Inc. https://www.nhlbi.nih.gov/files/docs/public/heart/dash_brief.pdf">  DASH Eating Plan DASH stands for Dietary Approaches to Stop Hypertension. The DASH eating plan is a healthy eating plan that has been shown to: Reduce high blood pressure (hypertension). Reduce your risk for type 2 diabetes, heart disease, and stroke. Help with weight loss. What are tips for  following this plan? Reading food labels Check food labels for the amount of salt (sodium) per serving. Choose foods with less than 5 percent of the Daily Value of sodium. Generally, foods with less than 300 milligrams (mg) of sodium per serving fit into this eating plan. To find whole grains, look for the word "whole" as the first word in the ingredient list. Shopping Buy products labeled as "low-sodium" or "no salt added." Buy fresh foods. Avoid canned foods and pre-made or frozen meals. Cooking Avoid adding salt when cooking. Use salt-free seasonings or herbs instead of table salt or sea salt. Check with your health care provider or pharmacist before using salt substitutes. Do not fry foods. Cook foods using healthy methods such as baking, boiling, grilling, roasting, and broiling instead. Cook with heart-healthy oils, such as olive, canola, avocado, soybean, or sunflower oil. Meal planning  Eat a balanced diet that includes: 4 or more servings of fruits and 4 or more servings of vegetables each day. Try to fill one-half of your plate with fruits and vegetables. 6-8 servings of whole grains each day. Less than 6 oz (170 g) of lean meat, poultry, or fish each day. A 3-oz (85-g) serving of   meat is about the same size as a deck of cards. One egg equals 1 oz (28 g). 2-3 servings of low-fat dairy each day. One serving is 1 cup (237 mL). 1 serving of nuts, seeds, or beans 5 times each week. 2-3 servings of heart-healthy fats. Healthy fats called omega-3 fatty acids are found in foods such as walnuts, flaxseeds, fortified milks, and eggs. These fats are also found in cold-water fish, such as sardines, salmon, and mackerel. Limit how much you eat of: Canned or prepackaged foods. Food that is high in trans fat, such as some fried foods. Food that is high in saturated fat, such as fatty meat. Desserts and other sweets, sugary drinks, and other foods with added sugar. Full-fat dairy products. Do  not salt foods before eating. Do not eat more than 4 egg yolks a week. Try to eat at least 2 vegetarian meals a week. Eat more home-cooked food and less restaurant, buffet, and fast food.  Lifestyle When eating at a restaurant, ask that your food be prepared with less salt or no salt, if possible. If you drink alcohol: Limit how much you use to: 0-1 drink a day for women who are not pregnant. 0-2 drinks a day for men. Be aware of how much alcohol is in your drink. In the [place] one drink equals one 12 oz bottle of beer (355 mL), one 5 oz glass of wine (148 mL), or one 1 oz glass of hard liquor (44 mL). General information Avoid eating more than 2,300 mg of salt a day. If you have hypertension, you may need to reduce your sodium intake to 1,500 mg a day. Work with your health care provider to maintain a healthy body weight or to lose weight. Ask what an ideal weight is for you. Get at least 30 minutes of exercise that causes your heart to beat faster (aerobic exercise) most days of the week. Activities may include walking, swimming, or biking. Work with your health care provider or dietitian to adjust your eating plan to your individual calorie needs. What foods should I eat? Fruits All fresh, dried, or frozen fruit. Canned fruit in natural juice (without addedsugar). Vegetables Fresh or frozen vegetables (raw, steamed, roasted, or grilled). Low-sodium or reduced-sodium tomato and vegetable juice. Low-sodium or reduced-sodium tomatosauce and tomato paste. Low-sodium or reduced-sodium canned vegetables. Grains Whole-grain or whole-wheat bread. Whole-grain or whole-wheat pasta. Brown rice. Oatmeal. Quinoa. Bulgur. Whole-grain and low-sodium cereals. Pita bread.Low-fat, low-sodium crackers. Whole-wheat flour tortillas. Meats and other proteins Skinless chicken or turkey. Ground chicken or turkey. Pork with fat trimmed off. Fish and seafood. Egg whites. Dried beans, peas, or lentils. Unsalted  nuts, nut butters, and seeds. Unsalted canned beans. Lean cuts of beef with fat trimmed off. Low-sodium, lean precooked or cured meat, such as sausages or meatloaves. Dairy Low-fat (1%) or fat-free (skim) milk. Reduced-fat, low-fat, or fat-free cheeses. Nonfat, low-sodium ricotta or cottage cheese. Low-fat or nonfatyogurt. Low-fat, low-sodium cheese. Fats and oils Soft margarine without trans fats. Vegetable oil. Reduced-fat, low-fat, or light mayonnaise and salad dressings (reduced-sodium). Canola, safflower, olive, avocado, soybean, andsunflower oils. Avocado. Seasonings and condiments Herbs. Spices. Seasoning mixes without salt. Other foods Unsalted popcorn and pretzels. Fat-free sweets. The items listed above may not be a complete list of foods and beverages you can eat. Contact a dietitian for more information. What foods should I avoid? Fruits Canned fruit in a light or heavy syrup. Fried fruit. Fruit in cream or buttersauce. Vegetables Creamed or fried vegetables.   Vegetables in a cheese sauce. Regular canned vegetables (not low-sodium or reduced-sodium). Regular canned tomato sauce and paste (not low-sodium or reduced-sodium). Regular tomato and vegetable juice(not low-sodium or reduced-sodium). Pickles. Olives. Grains Baked goods made with fat, such as croissants, muffins, or some breads. Drypasta or rice meal packs. Meats and other proteins Fatty cuts of meat. Ribs. Fried meat. Bacon. Bologna, salami, and other precooked or cured meats, such as sausages or meat loaves. Fat from the back of a pig (fatback). Bratwurst. Salted nuts and seeds. Canned beans with added salt. Canned orsmoked fish. Whole eggs or egg yolks. Chicken or turkey with skin. Dairy Whole or 2% milk, cream, and half-and-half. Whole or full-fat cream cheese. Whole-fat or sweetened yogurt. Full-fat cheese. Nondairy creamers. Whippedtoppings. Processed cheese and cheese spreads. Fats and oils Butter. Stick margarine.  Lard. Shortening. Ghee. Bacon fat. Tropical oils, suchas coconut, palm kernel, or palm oil. Seasonings and condiments Onion salt, garlic salt, seasoned salt, table salt, and sea salt. Worcestershire sauce. Tartar sauce. Barbecue sauce. Teriyaki sauce. Soy sauce, including reduced-sodium. Steak sauce. Canned and packaged gravies. Fish sauce. Oyster sauce. Cocktail sauce. Store-bought horseradish. Ketchup. Mustard. Meat flavorings and tenderizers. Bouillon cubes. Hot sauces. Pre-made or packaged marinades. Pre-made or packaged taco seasonings. Relishes. Regular saladdressings. Other foods Salted popcorn and pretzels. The items listed above may not be a complete list of foods and beverages you should avoid. Contact a dietitian for more information. Where to find more information National Heart, Lung, and Blood Institute: www.nhlbi.nih.gov American Heart Association: www.heart.org Academy of Nutrition and Dietetics: www.eatright.org National Kidney Foundation: www.kidney.org Summary The DASH eating plan is a healthy eating plan that has been shown to reduce high blood pressure (hypertension). It may also reduce your risk for type 2 diabetes, heart disease, and stroke. When on the DASH eating plan, aim to eat more fresh fruits and vegetables, whole grains, lean proteins, low-fat dairy, and heart-healthy fats. With the DASH eating plan, you should limit salt (sodium) intake to 2,300 mg a day. If you have hypertension, you may need to reduce your sodium intake to 1,500 mg a day. Work with your health care provider or dietitian to adjust your eating plan to your individual calorie needs. This information is not intended to replace advice given to you by your health care provider. Make sure you discuss any questions you have with your healthcare provider. Document Revised: 10/05/2019 Document Reviewed: 10/05/2019 Elsevier Patient Education  2022 Elsevier Inc.  

## 2021-05-19 NOTE — Progress Notes (Signed)
Established Patient Office Visit  Subjective:  Patient ID: Courtney Stafford, female    DOB: August 19, 1971  Age: 50 y.o. MRN: [id number]  CC:  Chief Complaint  Patient presents with   Follow-up   Diabetes    Patient has been checking her blood sugars at home and they have been running in the low 100s. Patient brought blood sugar log to her appointment.    HPI Courtney Stafford  is a 50 y/o female who has history of T1DM, Hypertension, Stroke, presents for routine follow up visit, lab review and medication refill. Her HgbA1c done during visit was 8.3%, she checks her blood glucose tid and per her log,her fasting readings ranges between 61-180 mg/dl, pre lunch readings ranges between 158-300 and pre dinner readings ranges between 102- 225 mg/dl. She reports occasional hypoglycemia, hyperglycemia and peripheral neuropathy is improved with taking gabapentin. She also performs daily foot checks. Her blood pressure was elevated during visit, she states that she's compliant with her medications, but unable to check her blood pressure at home because the battery to her blood pressure machine was dead. She denies headache, light headedness, chest pain and vision changes. She states that her Physical Therapy expired because her [employer] Assistance lapsed. She states that she continues to experience intermittent dull 5/10 pain to her right knee. She reports that walking aggravates symptoms, and she ambulates with a cane. Overall, she states that she's doing well and offers no further complaint.  Past Medical History:  Diagnosis Date   Diabetes mellitus without complication ([place])    Hypertension    Stroke ([place]) 07/31/2020    Past Surgical History:  Procedure Laterality Date   CARPAL TUNNEL RELEASE  6 years ago   both hands    Family History  Problem Relation Age of Onset   Other Mother        unknown medical history   Cancer Father    Diabetes Maternal Grandmother    Breast cancer Neg Hx      Social History   Socioeconomic History   Marital status: Single    Spouse name: Not on file   Number of children: 2   Years of education: Not on file   Highest education level: High school graduate  Occupational History   Occupation: unemployed  Tobacco Use   Smoking status: Some Days    Packs/day: 0.01    Pack years: 0.00    Types: Cigarettes   Smokeless tobacco: Former   Tobacco comments:    occasional  Vaping Use   Vaping Use: Never used  Substance and Sexual Activity   Alcohol use: No   Drug use: No   Sexual activity: Not on file  Other Topics Concern   Not on file  Social History Narrative   Completed biopsychosocial assessment, social determinants screening, administered PHQ 9, and GAD 7.       Social determinants screening completed 01/31/2020. HS She receives [facility] for transportation. She receives food stamps 192 per month and utilizes a Building surveyor when needed. [employer] pays for housing and utilities.  She follows up with therapist at clinic for stress, anxiety, and depression. HS      Support system is small.   Social Determinants of Health   Financial Resource Strain: Not on file  Food Insecurity: No Food Insecurity   Worried About [place] in the Last Year: Never true   Ran Out of Food in the Last  Year: Never true  Transportation Needs: No Transportation Needs   Lack of Transportation (Medical): No   Lack of Transportation (Non-Medical): No  Physical Activity: Not on file  Stress: Not on file  Social Connections: Not on file  Intimate Partner Violence: Not on file    Outpatient Medications Prior to Visit  Medication Sig Dispense Refill   amLODipine (NORVASC) 10 MG tablet TAKE ONE TABLET BY MOUTH EVERY DAY 90 tablet 0   atorvastatin (LIPITOR) 80 MG tablet TAKE ONE TABLET BY MOUTH EVERY DAY 90 tablet 3   Blood Glucose Monitoring Suppl Supplies MISC 1 each by Does not apply route 5 (five) times daily.  (Patient taking differently: 1 each by Does not apply route. Check 3 to 4 times daily) 500 each 4   CYMBALTA 60 MG capsule TAKE ONE CAPSULE BY MOUTH EVERY MORNING 30 capsule 2   gabapentin (NEURONTIN) 300 MG capsule TAKE 2 CAPSULES (600MG) BY MOUTH 2 TIMES A DAY AND 3 CAPSULES (900MG) AT NIGHT 210 capsule 1   glucose blood test strip USE TO TEST BLOOD GLUCOSE UP TO 4 TIMES PER DAY, IN THE MORNING AND AFTER MEALS 100 strip 11   hydrALAZINE (APRESOLINE) 25 MG tablet TAKE ONE TABLET BY MOUTH 2 TIMES A DAY 60 tablet 2   insulin aspart (NOVOLOG FLEXPEN) 100 UNIT/ML FlexPen She states taking 5-7 units with meals and adjust with sliding scale as prescribed by Endocrinology.. (Patient taking differently: She states taking 5-9 units with meals and adjust with sliding scale as prescribed by Endocrinology.Marland Kitchen) 75 mL 3   insulin glargine (LANTUS) 100 UNIT/ML Solostar Pen Inject 30 Units into the skin at bedtime. 30 mL 4   Insulin Pen Needle (BD PEN NEEDLE NANO U/F) 32G X 4 MM MISC 5 times daily (Patient taking differently: 3 to 4 times daily) 200 each 12   Insulin Pen Needle 32G X 6 MM MISC USE AS DIRECTED 400 each 0   Insulin Syringe-Needle U-100 31G X 15/64" 1 ML MISC AS DIRECTED 90 each 11   mirtazapine (REMERON) 30 MG tablet Take 1 tablet (30 mg total) by mouth at bedtime. 30 tablet 2   Rightest GL300 Lancets MISC USE AS DIRECTED 100 each 3   carvedilol (COREG) 12.5 MG tablet TAKE ONE TABLET BY MOUTH 2 TIMES A DAY WITH A MEAL 60 tablet 3   hydrochlorothiazide (HYDRODIURIL) 25 MG tablet TAKE ONE TABLET BY MOUTH EVERY DAY 90 tablet 1   lisinopril (ZESTRIL) 40 MG tablet TAKE ONE TABLET BY MOUTH EVERY DAY 90 tablet 0   aspirin EC 81 MG EC tablet Take 1 tablet (81 mg total) by mouth daily. Swallow whole. (Patient not taking: Reported on 05/19/2021) 30 tablet 11   insulin aspart (NOVOLOG FLEXPEN) 100 UNIT/ML FlexPen Inject 5-7 units into the skin daily with meals. (Do not inject more than 21 units per day). 30 mL 0    insulin glargine (LANTUS) 100 UNIT/ML injection INJECT 30 UNITS UNDER THE SKIN AT BEDTIME 30 mL 4   meloxicam (MOBIC) 7.5 MG tablet Take 1 tablet (7.5 mg total) by mouth daily as needed for pain. (Patient not taking: Reported on 05/19/2021) 30 tablet 0   No facility-administered medications prior to visit.    Allergies  Allergen Reactions   Phenergan [Promethazine] Hives    ROS Review of Systems  Constitutional: Negative.   Eyes: Negative.   Respiratory: Negative.    Cardiovascular: Negative.   Endocrine: Negative.   Musculoskeletal:  Positive for arthralgias (chronic right knee  pain).  Neurological: Negative.   Psychiatric/Behavioral: Negative.       Objective:    Physical Exam HENT:     Head: Normocephalic and atraumatic.     Mouth/Throat:     Mouth: Mucous membranes are moist.  Eyes:     Extraocular Movements: Extraocular movements intact.     Conjunctiva/sclera: Conjunctivae normal.     Pupils: Pupils are equal, round, and reactive to light.  Cardiovascular:     Rate and Rhythm: Normal rate and regular rhythm.     Pulses: Normal pulses.     Heart sounds: Normal heart sounds.  Pulmonary:     Effort: Pulmonary effort is normal.     Breath sounds: Normal breath sounds.  Musculoskeletal:        General: Tenderness (with palpation to medial and lateral aspect of right knee) present.  Skin:    General: Skin is warm.  Neurological:     General: No focal deficit present.     Mental Status: She is alert and oriented to person, place, and time. Mental status is at baseline.  Psychiatric:        Mood and Affect: Mood normal.        Behavior: Behavior normal.        Thought Content: Thought content normal.        Judgment: Judgment normal.    BP (!) 155/78 (BP Location: Right Arm, Patient Position: Sitting, Cuff Size: Large)   Pulse 88   Temp (!) 97.5 F (36.4 C)   Resp 16   Ht 5' (1.524 m)   Wt 195 lb 1.6 oz (88.5 kg)   LMP 01/03/2016 (Within Years)   SpO2 96%    BMI 38.10 kg/m  Wt Readings from Last 3 Encounters:  05/19/21 195 lb 1.6 oz (88.5 kg)  01/21/21 194 lb 8 oz (88.2 kg)  01/14/21 195 lb 8 oz (88.7 kg)   Encouraged weight loss  Health Maintenance Due  Topic Date Due   COVID-19 Vaccine (1) Never done   Pneumococcal Vaccine 67-26 Years old (1 - PCV) Never done   HIV Screening  Never done   Hepatitis C Screening  Never done   TETANUS/TDAP  Never done   Zoster Vaccines- Shingrix (1 of 2) Never done   COLONOSCOPY (Pts 45-58yr Insurance coverage will need to be confirmed)  Never done   OPHTHALMOLOGY EXAM  03/23/2019    There are no preventive care reminders to display for this patient.  Lab Results  Component Value Date   TSH 0.951 07/31/2020   Lab Results  Component Value Date   WBC 9.2 08/02/2020   HGB 16.0 (H) 08/02/2020   HCT 48.5 (H) 08/02/2020   MCV 83.9 08/02/2020   PLT 328 08/02/2020   Lab Results  Component Value Date   NA 142 01/14/2021   K 3.9 01/14/2021   CO2 22 01/14/2021   GLUCOSE 155 (H) 01/14/2021   BUN 10 01/14/2021   CREATININE 0.94 01/14/2021   BILITOT 0.5 01/14/2021   ALKPHOS 133 (H) 01/14/2021   AST 18 01/14/2021   ALT 12 01/14/2021   PROT 7.4 01/14/2021   ALBUMIN 4.5 01/14/2021   CALCIUM 9.6 01/14/2021   ANIONGAP 11 08/02/2020   EGFR 73 01/14/2021   Lab Results  Component Value Date   CHOL 218 (H) 07/31/2020   Lab Results  Component Value Date   HDL 78 07/31/2020   Lab Results  Component Value Date   LDLCALC 109 (H) 07/31/2020  Lab Results  Component Value Date   TRIG 157 (H) 07/31/2020   Lab Results  Component Value Date   CHOLHDL 2.8 07/31/2020   Lab Results  Component Value Date   HGBA1C 8.3 (A) 05/19/2021      Assessment & Plan:    1. Type 1 diabetes mellitus without complication (HCC) -Her PETK2O was 8.3%, her goal should be less than 6%. She will continue on current medication, she was advised to continue on low carb/non concentrated sweet diet and exercise as  tolerated. She was advised to check her blood glucose tid, record and bring log to follow up appointment, her fasting reading should be between 80-130 mg/dl. - POCT HgB A1C; Future - POCT HgB A1C - HgB A1c; Future  2. Essential hypertension - Her blood pressure was not under control, her goal reading should be less than 140/90. She will continue on current medication, DASH diet and exercises as tolerated. She was provided with battery for her blood pressure machine. She was advised to check her blood pressure daily, record and bring log to follow up appointment. - aspirin 81 MG EC tablet; Take 1 tablet (81 mg total) by mouth daily. Swallow whole.  Dispense: 30 tablet; Refill: 11 - amLODipine (NORVASC) 10 MG tablet; TAKE ONE TABLET BY MOUTH EVERY DAY  Dispense: 90 tablet; Refill: 1 - carvedilol (COREG) 12.5 MG tablet; TAKE ONE TABLET BY MOUTH 2 TIMES A DAY WITH A MEAL  Dispense: 60 tablet; Refill: 3 - hydrochlorothiazide (HYDRODIURIL) 25 MG tablet; TAKE ONE TABLET BY MOUTH EVERY DAY  Dispense: 90 tablet; Refill: 1 - lisinopril (ZESTRIL) 40 MG tablet; TAKE ONE TABLET BY MOUTH EVERY DAY  Dispense: 90 tablet; Refill: 1  3. Chronic pain of left knee - She was advised to continue on current medication, complete [facility] financial application for continuation of her Physical Therapy session. She was advised to go to the ED for worsening symptoms. - meloxicam (MOBIC) 7.5 MG tablet; Take 1 tablet (7.5 mg total) by mouth daily as needed for pain.  Dispense: 30 tablet; Refill: 1     Follow-up: Return in about 30 days (around 06/18/2021), or if symptoms worsen or fail to improve.    Saba Neuman Jerold Coombe, NP

## 2021-05-20 ENCOUNTER — Other Ambulatory Visit: Payer: Self-pay

## 2021-05-21 ENCOUNTER — Other Ambulatory Visit: Payer: Self-pay

## 2021-05-22 ENCOUNTER — Other Ambulatory Visit: Payer: Self-pay

## 2021-05-22 MED ORDER — INSULIN GLARGINE 100 UNIT/ML ~~LOC~~ SOLN
SUBCUTANEOUS | 0 refills | Status: DC
  Filled 2021-05-22: qty 30, 84d supply, fill #0

## 2021-05-22 MED ORDER — NOVOFINE PEN NEEDLE 32G X 6 MM MISC
99 refills | Status: DC
  Filled 2021-05-22: qty 300, 120d supply, fill #0
  Filled 2021-05-27: qty 300, 100d supply, fill #0
  Filled 2021-08-04: qty 300, 100d supply, fill #1

## 2021-05-25 ENCOUNTER — Other Ambulatory Visit: Payer: Self-pay

## 2021-05-26 ENCOUNTER — Other Ambulatory Visit: Payer: Self-pay | Admitting: Gerontology

## 2021-05-26 ENCOUNTER — Other Ambulatory Visit: Payer: Self-pay

## 2021-05-26 DIAGNOSIS — G8929 Other chronic pain: Secondary | ICD-10-CM

## 2021-05-26 DIAGNOSIS — M25562 Pain in left knee: Secondary | ICD-10-CM

## 2021-05-26 MED ORDER — MELOXICAM 7.5 MG PO TABS
7.5000 mg | ORAL_TABLET | Freq: Every day | ORAL | 1 refills | Status: DC | PRN
Start: 2021-05-26 — End: 2022-02-02
  Filled 2021-05-26: qty 30, 30d supply, fill #0
  Filled 2021-08-04: qty 30, 30d supply, fill #1

## 2021-05-26 MED ORDER — ASPIRIN 81 MG PO TBEC
81.0000 mg | DELAYED_RELEASE_TABLET | Freq: Every day | ORAL | 11 refills | Status: DC
  Filled 2021-05-26: qty 30, 30d supply, fill #0
  Filled 2021-06-19: qty 30, 30d supply, fill #1
  Filled 2021-08-04: qty 30, 30d supply, fill #2
  Filled 2021-08-25 – 2021-08-28 (×2): qty 30, 30d supply, fill #3
  Filled 2021-10-13: qty 30, 30d supply, fill #4
  Filled 2021-12-09: qty 30, 30d supply, fill #5
  Filled 2022-01-12: qty 30, 30d supply, fill #6

## 2021-05-26 MED ORDER — MELOXICAM 7.5 MG PO TABS: 7.5000 mg | ORAL_TABLET | Freq: Every day | ORAL | 1 refills | Status: DC | PRN

## 2021-05-27 ENCOUNTER — Other Ambulatory Visit: Payer: Self-pay

## 2021-05-28 ENCOUNTER — Other Ambulatory Visit: Payer: Self-pay

## 2021-05-28 ENCOUNTER — Ambulatory Visit: Payer: Self-pay | Admitting: Licensed Clinical Social Worker

## 2021-05-28 DIAGNOSIS — F331 Major depressive disorder, recurrent, moderate: Secondary | ICD-10-CM

## 2021-05-28 DIAGNOSIS — F4310 Post-traumatic stress disorder, unspecified: Secondary | ICD-10-CM

## 2021-05-28 NOTE — BH Specialist Note (Signed)
Integrated Behavioral Health Follow Up In-Person Visit  MRN: [id number] Name: Courtney Stafford   Total time: 60 minutes  Types of Service: Telephone visit Patient consents to telephone visit and 2 patient identifiers were used to identify patient   Interpretor:No. Interpretor Name and Language: N/A  Subjective: Courtney Stafford is a 50 y.o. female accompanied by  herself Patient was referred by Hurman Horn, NP for mental health. Patient reports the following symptoms/concerns: The patient noted that she is doing the same since her last follow-up visit. She explained that she is tired today and sleeps better some nights than others. She noted she does not have a routine at home and usually does off with the TV on in the background. The patient discussed financial stressors impacting her life. She noted that she was able to get transportation [facility] to [facility] her prescriptions. She stated that was the only time she has left her house this week. She discussed difficulties navigating the charity care application process and shared her concerns about not being able to afford her medical bills. She explained that she is no longer able to go to her physical therapy appointments, and is concerned that without them she won't be able to regain her full mobility in her legs and hand. She explained The patient stated that she is still waiting on her tax paper work to arrive. The patient denied any suicidal or homicidal thoughts.  Duration of problem: Years; Severity of problem: moderate  Objective: Mood: Euthymic and Affect: Appropriate Risk of harm to self or others: No plan to harm self or others  Life Context: Family and Social: see above School/Work: see above Self-Care: see above Life Changes: see above  Patient and/or Family's Strengths/Protective Factors: Concrete supports in place (healthy food, safe environments, etc.)  Goals Addressed: Patient will:  Reduce  symptoms of: anxiety, depression, and stress   Increase knowledge and/or ability of: coping skills, healthy habits, and stress reduction   Demonstrate ability to: Increase healthy adjustment to current life circumstances  Progress towards Goals: Ongoing  Interventions: Interventions utilized:  CBT Cognitive Behavioral Therapy was utilized by the clinician during today's follow up session. The clinician processed with the patient how they have been doing since the last follow-up session. The clinician provided a space for the patient to ventilate their frustrations regarding their current life circumstances. Clinician measured the patient's anxiety and depression on a numerical scale. Clinician reviewed sleep hygiene with the patient and encouraged the patient to establish a bedtime routine and to set an alarm to wake at the same time each day. Clinician encouraged the patient to incorporate self care into her daily routine, and increase her social supports in her life through support groups or finding interests groups or hobbies with in her community. Clinician explained that while telephone sessions are beneficial due to transportation barriers and Covid precautions she would also benefit form in person sessions in the future.  Standardized Assessments completed: GAD-7 and PHQ 9 GAD-7    12 PHQ-9    17   Assessment: Patient currently experiencing see above.   Patient may benefit from see above.  Plan: Follow up with behavioral health clinician on : 06/17/2021 at 11:00 AM Behavioral recommendations:  Referral(s): [facility] (In Clinic) "From scale of 1-10, how likely are you to follow plan?":   Judith Part, LCSWA

## 2021-05-29 ENCOUNTER — Other Ambulatory Visit: Payer: Self-pay

## 2021-06-16 ENCOUNTER — Telehealth: Payer: Self-pay

## 2021-06-16 NOTE — Telephone Encounter (Signed)
Social worker called patient and rescheduled her appointment with our LCSWA.

## 2021-06-17 ENCOUNTER — Ambulatory Visit: Payer: Self-pay | Admitting: Licensed Clinical Social Worker

## 2021-06-18 ENCOUNTER — Ambulatory Visit: Payer: Self-pay | Admitting: Gerontology

## 2021-06-19 ENCOUNTER — Other Ambulatory Visit: Payer: Self-pay

## 2021-06-19 MED FILL — Glucose Blood Test Strip: 25 days supply | Qty: 100 | Fill #3 | Status: AC

## 2021-06-22 ENCOUNTER — Other Ambulatory Visit: Payer: Self-pay

## 2021-06-23 ENCOUNTER — Other Ambulatory Visit: Payer: Self-pay

## 2021-06-23 ENCOUNTER — Ambulatory Visit: Payer: Self-pay | Admitting: Gerontology

## 2021-06-23 ENCOUNTER — Encounter: Payer: Self-pay | Admitting: Gerontology

## 2021-06-23 VITALS — BP 112/76 | HR 71 | Temp 97.2°F | Resp 16 | Ht 60.0 in | Wt 192.1 lb

## 2021-06-23 DIAGNOSIS — I10 Essential (primary) hypertension: Secondary | ICD-10-CM

## 2021-06-23 NOTE — Progress Notes (Deleted)
Established Patient Office Visit  Subjective:  Patient ID: Courtney Stafford, female    DOB: 1971/04/04  Age: 50 y.o. MRN: [id number]  CC: No chief complaint on file.   HPI RIELLY BRUNN presents for ***  Past Medical History:  Diagnosis Date   Diabetes mellitus without complication ([place])    Hypertension    Stroke ([place]) 07/31/2020    Past Surgical History:  Procedure Laterality Date   CARPAL TUNNEL RELEASE  6 years ago   both hands    Family History  Problem Relation Age of Onset   Other Mother        unknown medical history   Cancer Father    Diabetes Maternal Grandmother    Breast cancer Neg Hx     Social History   Socioeconomic History   Marital status: Single    Spouse name: Not on file   Number of children: 2   Years of education: Not on file   Highest education level: High school graduate  Occupational History   Occupation: unemployed  Tobacco Use   Smoking status: Some Days    Packs/day: 0.01    Types: Cigarettes   Smokeless tobacco: Former   Tobacco comments:    occasional  Vaping Use   Vaping Use: Never used  Substance and Sexual Activity   Alcohol use: No   Drug use: No   Sexual activity: Not on file  Other Topics Concern   Not on file  Social History Narrative   Completed biopsychosocial assessment, social determinants screening, administered PHQ 9, and GAD 7.       Social determinants screening completed 01/31/2020. HS She receives [facility] passes [facility] for transportation. She receives food stamps 192 per month and utilizes a Building surveyor when needed. [employer] pays for housing and utilities.  She follows up with therapist at clinic for stress, anxiety, and depression. HS      Support system is small.   Social Determinants of Health   Financial Resource Strain: Not on file  Food Insecurity: No Food Insecurity   Worried About Charity fundraiser in the Last Year: Never true   Ran Out of Food in the Last Year:  Never true  Transportation Needs: No Transportation Needs   Lack of Transportation (Medical): No   Lack of Transportation (Non-Medical): No  Physical Activity: Not on file  Stress: Not on file  Social Connections: Not on file  Intimate Partner Violence: Not on file    Outpatient Medications Prior to Visit  Medication Sig Dispense Refill   amLODipine (NORVASC) 10 MG tablet TAKE ONE TABLET BY MOUTH EVERY DAY 90 tablet 0   amLODipine (NORVASC) 10 MG tablet TAKE ONE TABLET BY MOUTH EVERY DAY 90 tablet 1   aspirin 81 MG EC tablet Take 1 tablet (81 mg total) by mouth daily. Swallow whole. 30 tablet 11   aspirin 81 MG EC tablet Take 1 tablet (81 mg total) by mouth daily. 30 tablet 11   atorvastatin (LIPITOR) 80 MG tablet TAKE ONE TABLET BY MOUTH EVERY DAY 90 tablet 3   Blood Glucose Monitoring Suppl Supplies MISC 1 each by Does not apply route 5 (five) times daily. (Patient taking differently: 1 each by Does not apply route. Check 3 to 4 times daily) 500 each 4   carvedilol (COREG) 12.5 MG tablet TAKE ONE TABLET BY MOUTH 2 TIMES A DAY WITH A MEAL 60 tablet 3   CYMBALTA 60 MG capsule TAKE  ONE CAPSULE BY MOUTH EVERY MORNING 30 capsule 2   gabapentin (NEURONTIN) 300 MG capsule TAKE 2 CAPSULES (600MG) BY MOUTH 2 TIMES A DAY AND 3 CAPSULES (900MG) AT NIGHT 210 capsule 1   glucose blood test strip USE TO TEST BLOOD GLUCOSE UP TO 4 TIMES PER DAY, IN THE MORNING AND AFTER MEALS 100 strip 11   hydrALAZINE (APRESOLINE) 25 MG tablet TAKE ONE TABLET BY MOUTH 2 TIMES A DAY 60 tablet 2   hydrochlorothiazide (HYDRODIURIL) 25 MG tablet TAKE ONE TABLET BY MOUTH EVERY DAY 90 tablet 1   insulin aspart (NOVOLOG FLEXPEN) 100 UNIT/ML FlexPen She states taking 5-7 units with meals and adjust with sliding scale as prescribed by Endocrinology.. (Patient taking differently: She states taking 5-9 units with meals and adjust with sliding scale as prescribed by Endocrinology.Marland Kitchen) 75 mL 3   insulin glargine (LANTUS) 100 UNIT/ML  injection Inject 30 units under the skin daily at bedtime. 30 mL 0   insulin glargine (LANTUS) 100 UNIT/ML Solostar Pen Inject 30 Units into the skin at bedtime. 30 mL 4   Insulin Pen Needle (BD PEN NEEDLE NANO U/F) 32G X 4 MM MISC 5 times daily (Patient taking differently: 3 to 4 times daily) 200 each 12   Insulin Pen Needle (NOVOFINE PEN NEEDLE) 32G X 6 MM MISC use as directed 300 each PRN   Insulin Syringe-Needle U-100 31G X 15/64" 1 ML MISC AS DIRECTED 90 each 11   lisinopril (ZESTRIL) 40 MG tablet TAKE ONE TABLET BY MOUTH EVERY DAY 90 tablet 1   meloxicam (MOBIC) 7.5 MG tablet Take 1 tablet (7.5 mg total) by mouth once daily as needed for pain. 30 tablet 1   mirtazapine (REMERON) 30 MG tablet Take 1 tablet (30 mg total) by mouth at bedtime. 30 tablet 2   Rightest GL300 Lancets MISC USE AS DIRECTED 100 each 3   No facility-administered medications prior to visit.    Allergies  Allergen Reactions   Phenergan [Promethazine] Hives    ROS Review of Systems    Objective:    Physical Exam  LMP 01/03/2016 (Within Years)  Wt Readings from Last 3 Encounters:  05/19/21 195 lb 1.6 oz (88.5 kg)  01/21/21 194 lb 8 oz (88.2 kg)  01/14/21 195 lb 8 oz (88.7 kg)     Health Maintenance Due  Topic Date Due   COVID-19 Vaccine (1) Never done   HIV Screening  Never done   Hepatitis C Screening  Never done   TETANUS/TDAP  Never done   Zoster Vaccines- Shingrix (1 of 2) Never done   COLONOSCOPY (Pts 45-45yr Insurance coverage will need to be confirmed)  Never done   OPHTHALMOLOGY EXAM  03/23/2019   Pneumococcal Vaccine 08766Years old (2 - PCV) 08/06/2020   INFLUENZA VACCINE  06/15/2021    There are no preventive care reminders to display for this patient.  Lab Results  Component Value Date   TSH 0.951 07/31/2020   Lab Results  Component Value Date   WBC 9.2 08/02/2020   HGB 16.0 (H) 08/02/2020   HCT 48.5 (H) 08/02/2020   MCV 83.9 08/02/2020   PLT 328 08/02/2020   Lab Results   Component Value Date   NA 142 01/14/2021   K 3.9 01/14/2021   CO2 22 01/14/2021   GLUCOSE 155 (H) 01/14/2021   BUN 10 01/14/2021   CREATININE 0.94 01/14/2021   BILITOT 0.5 01/14/2021   ALKPHOS 133 (H) 01/14/2021   AST 18 01/14/2021  ALT 12 01/14/2021   PROT 7.4 01/14/2021   ALBUMIN 4.5 01/14/2021   CALCIUM 9.6 01/14/2021   ANIONGAP 11 08/02/2020   EGFR 73 01/14/2021   Lab Results  Component Value Date   CHOL 218 (H) 07/31/2020   Lab Results  Component Value Date   HDL 78 07/31/2020   Lab Results  Component Value Date   LDLCALC 109 (H) 07/31/2020   Lab Results  Component Value Date   TRIG 157 (H) 07/31/2020   Lab Results  Component Value Date   CHOLHDL 2.8 07/31/2020   Lab Results  Component Value Date   HGBA1C 8.3 (A) 05/19/2021      Assessment & Plan:   Problem List Items Addressed This Visit   None   No orders of the defined types were placed in this encounter.   Follow-up: No follow-ups on file.    Merrissa Giacobbe Jerold Coombe, NP

## 2021-06-23 NOTE — Patient Instructions (Signed)
https://www.nhlbi.nih.gov/files/docs/public/heart/dash_brief.pdf">  DASH Eating Plan DASH stands for Dietary Approaches to Stop Hypertension. The DASH eating plan is a healthy eating plan that has been shown to: Reduce high blood pressure (hypertension). Reduce your risk for type 2 diabetes, heart disease, and stroke. Help with weight loss. What are tips for following this plan? Reading food labels Check food labels for the amount of salt (sodium) per serving. Choose foods with less than 5 percent of the Daily Value of sodium. Generally, foods with less than 300 milligrams (mg) of sodium per serving fit into this eating plan. To find whole grains, look for the word "whole" as the first word in the ingredient list. Shopping Buy products labeled as "low-sodium" or "no salt added." Buy fresh foods. Avoid canned foods and pre-made or frozen meals. Cooking Avoid adding salt when cooking. Use salt-free seasonings or herbs instead of table salt or sea salt. Check with your health care provider or pharmacist before using salt substitutes. Do not fry foods. Cook foods using healthy methods such as baking, boiling, grilling, roasting, and broiling instead. Cook with heart-healthy oils, such as olive, canola, avocado, soybean, or sunflower oil. Meal planning  Eat a balanced diet that includes: 4 or more servings of fruits and 4 or more servings of vegetables each day. Try to fill one-half of your plate with fruits and vegetables. 6-8 servings of whole grains each day. Less than 6 oz (170 g) of lean meat, poultry, or fish each day. A 3-oz (85-g) serving of meat is about the same size as a deck of cards. One egg equals 1 oz (28 g). 2-3 servings of low-fat dairy each day. One serving is 1 cup (237 mL). 1 serving of nuts, seeds, or beans 5 times each week. 2-3 servings of heart-healthy fats. Healthy fats called omega-3 fatty acids are found in foods such as walnuts, flaxseeds, fortified milks, and eggs.  These fats are also found in cold-water fish, such as sardines, salmon, and mackerel. Limit how much you eat of: Canned or prepackaged foods. Food that is high in trans fat, such as some fried foods. Food that is high in saturated fat, such as fatty meat. Desserts and other sweets, sugary drinks, and other foods with added sugar. Full-fat dairy products. Do not salt foods before eating. Do not eat more than 4 egg yolks a week. Try to eat at least 2 vegetarian meals a week. Eat more home-cooked food and less restaurant, buffet, and fast food.  Lifestyle When eating at a restaurant, ask that your food be prepared with less salt or no salt, if possible. If you drink alcohol: Limit how much you use to: 0-1 drink a day for women who are not pregnant. 0-2 drinks a day for men. Be aware of how much alcohol is in your drink. In the [place] one drink equals one 12 oz bottle of beer (355 mL), one 5 oz glass of wine (148 mL), or one 1 oz glass of hard liquor (44 mL). General information Avoid eating more than 2,300 mg of salt a day. If you have hypertension, you may need to reduce your sodium intake to 1,500 mg a day. Work with your health care provider to maintain a healthy body weight or to lose weight. Ask what an ideal weight is for you. Get at least 30 minutes of exercise that causes your heart to beat faster (aerobic exercise) most days of the week. Activities may include walking, swimming, or biking. Work with your health care provider   or dietitian to adjust your eating plan to your individual calorie needs. What foods should I eat? Fruits All fresh, dried, or frozen fruit. Canned fruit in natural juice (without addedsugar). Vegetables Fresh or frozen vegetables (raw, steamed, roasted, or grilled). Low-sodium or reduced-sodium tomato and vegetable juice. Low-sodium or reduced-sodium tomatosauce and tomato paste. Low-sodium or reduced-sodium canned vegetables. Grains Whole-grain or  whole-wheat bread. Whole-grain or whole-wheat pasta. Brown rice. Oatmeal. Quinoa. Bulgur. Whole-grain and low-sodium cereals. Pita bread.Low-fat, low-sodium crackers. Whole-wheat flour tortillas. Meats and other proteins Skinless chicken or turkey. Ground chicken or turkey. Pork with fat trimmed off. Fish and seafood. Egg whites. Dried beans, peas, or lentils. Unsalted nuts, nut butters, and seeds. Unsalted canned beans. Lean cuts of beef with fat trimmed off. Low-sodium, lean precooked or cured meat, such as sausages or meatloaves. Dairy Low-fat (1%) or fat-free (skim) milk. Reduced-fat, low-fat, or fat-free cheeses. Nonfat, low-sodium ricotta or cottage cheese. Low-fat or nonfatyogurt. Low-fat, low-sodium cheese. Fats and oils Soft margarine without trans fats. Vegetable oil. Reduced-fat, low-fat, or light mayonnaise and salad dressings (reduced-sodium). Canola, safflower, olive, avocado, soybean, andsunflower oils. Avocado. Seasonings and condiments Herbs. Spices. Seasoning mixes without salt. Other foods Unsalted popcorn and pretzels. Fat-free sweets. The items listed above may not be a complete list of foods and beverages you can eat. Contact a dietitian for more information. What foods should I avoid? Fruits Canned fruit in a light or heavy syrup. Fried fruit. Fruit in cream or buttersauce. Vegetables Creamed or fried vegetables. Vegetables in a cheese sauce. Regular canned vegetables (not low-sodium or reduced-sodium). Regular canned tomato sauce and paste (not low-sodium or reduced-sodium). Regular tomato and vegetable juice(not low-sodium or reduced-sodium). Pickles. Olives. Grains Baked goods made with fat, such as croissants, muffins, or some breads. Drypasta or rice meal packs. Meats and other proteins Fatty cuts of meat. Ribs. Fried meat. Bacon. Bologna, salami, and other precooked or cured meats, such as sausages or meat loaves. Fat from the back of a pig (fatback). Bratwurst.  Salted nuts and seeds. Canned beans with added salt. Canned orsmoked fish. Whole eggs or egg yolks. Chicken or turkey with skin. Dairy Whole or 2% milk, cream, and half-and-half. Whole or full-fat cream cheese. Whole-fat or sweetened yogurt. Full-fat cheese. Nondairy creamers. Whippedtoppings. Processed cheese and cheese spreads. Fats and oils Butter. Stick margarine. Lard. Shortening. Ghee. Bacon fat. Tropical oils, suchas coconut, palm kernel, or palm oil. Seasonings and condiments Onion salt, garlic salt, seasoned salt, table salt, and sea salt. Worcestershire sauce. Tartar sauce. Barbecue sauce. Teriyaki sauce. Soy sauce, including reduced-sodium. Steak sauce. Canned and packaged gravies. Fish sauce. Oyster sauce. Cocktail sauce. Store-bought horseradish. Ketchup. Mustard. Meat flavorings and tenderizers. Bouillon cubes. Hot sauces. Pre-made or packaged marinades. Pre-made or packaged taco seasonings. Relishes. Regular saladdressings. Other foods Salted popcorn and pretzels. The items listed above may not be a complete list of foods and beverages you should avoid. Contact a dietitian for more information. Where to find more information National Heart, Lung, and Blood Institute: www.nhlbi.nih.gov American Heart Association: www.heart.org Academy of Nutrition and Dietetics: www.eatright.org National Kidney Foundation: www.kidney.org Summary The DASH eating plan is a healthy eating plan that has been shown to reduce high blood pressure (hypertension). It may also reduce your risk for type 2 diabetes, heart disease, and stroke. When on the DASH eating plan, aim to eat more fresh fruits and vegetables, whole grains, lean proteins, low-fat dairy, and heart-healthy fats. With the DASH eating plan, you should limit salt (sodium) intake to 2,300   mg a day. If you have hypertension, you may need to reduce your sodium intake to 1,500 mg a day. Work with your health care provider or dietitian to adjust  your eating plan to your individual calorie needs. This information is not intended to replace advice given to you by your health care provider. Make sure you discuss any questions you have with your healthcare provider. Document Revised: 10/05/2019 Document Reviewed: 10/05/2019 Elsevier Patient Education  2022 Elsevier Inc.  

## 2021-06-23 NOTE — Progress Notes (Signed)
Established Patient Office Visit  Subjective:  Patient ID: Courtney Stafford, female    DOB: [birth year]  Age: 50 y.o. MRN: [id number]  CC:  Chief Complaint  Patient presents with   Follow-up   Hypertension    Patient states BP was elevated at last OV. Patient has been taking medications as prescribed. Patient has been checking her BP at home and brought a log with her today.    HPI MARRI Stafford is a 50 y/o female who has history of T1DM, Hypertension, Stroke, presents for HTN follow up. She states that she's compliant with her medications and continues to make healthy lifestyle changes. She checks her blood pressure  every other day at home and brought her log with her. Her SBP ranges between 139-176 and DBP ranges between 95-110. She denies chest pain, palpitation, dizziness, paresthesia and vision changes.  Overall she states that she is doing well all fives no further complaint.  Past Medical History:  Diagnosis Date   Diabetes mellitus without complication ([place])    Hypertension    Stroke ([place]) 07/31/2020    Past Surgical History:  Procedure Laterality Date   CARPAL TUNNEL RELEASE  6 years ago   both hands    Family History  Problem Relation Age of Onset   Other Mother        unknown medical history   Cancer Father    Diabetes Maternal Grandmother    Breast cancer Neg Hx     Social History   Socioeconomic History   Marital status: Single    Spouse name: Not on file   Number of children: 2   Years of education: Not on file   Highest education level: High school graduate  Occupational History   Occupation: unemployed  Tobacco Use   Smoking status: Some Days    Packs/day: 0.01    Types: Cigarettes   Smokeless tobacco: Former   Tobacco comments:    occasional  Vaping Use   Vaping Use: Never used  Substance and Sexual Activity   Alcohol use: No   Drug use: No   Sexual activity: Not on file  Other Topics Concern   Not on file  Social History Narrative    Completed biopsychosocial assessment, social determinants screening, administered PHQ 9, and GAD 7.       Social determinants screening completed 01/31/2020. HS She receives bus passes [facility] for transportation. She receives food stamps 192 per month and utilizes a Building surveyor when needed. [employer] pays for housing and utilities.  She follows up with therapist at clinic for stress, anxiety, and depression. HS      Support system is small.   Social Determinants of Health   Financial Resource Strain: Not on file  Food Insecurity: No Food Insecurity   Worried About Charity fundraiser in the Last Year: Never true   Ran Out of Food in the Last Year: Never true  Transportation Needs: No Transportation Needs   Lack of Transportation (Medical): No   Lack of Transportation (Non-Medical): No  Physical Activity: Not on file  Stress: Not on file  Social Connections: Not on file  Intimate Partner Violence: Not on file    Outpatient Medications Prior to Visit  Medication Sig Dispense Refill   amLODipine (NORVASC) 10 MG tablet TAKE ONE TABLET BY MOUTH EVERY DAY 90 tablet 1   aspirin 81 MG EC tablet Take 1 tablet (81 mg total) by mouth daily. 30 tablet 11  atorvastatin (LIPITOR) 80 MG tablet TAKE ONE TABLET BY MOUTH EVERY DAY 90 tablet 3   Blood Glucose Monitoring Suppl Supplies MISC 1 each by Does not apply route 5 (five) times daily. (Patient taking differently: 1 each by Does not apply route. Check 3 to 4 times daily) 500 each 4   carvedilol (COREG) 12.5 MG tablet TAKE ONE TABLET BY MOUTH 2 TIMES A DAY WITH A MEAL 60 tablet 3   CYMBALTA 60 MG capsule TAKE ONE CAPSULE BY MOUTH EVERY MORNING 30 capsule 2   gabapentin (NEURONTIN) 300 MG capsule TAKE 2 CAPSULES (600MG) BY MOUTH 2 TIMES A DAY AND 3 CAPSULES (900MG) AT NIGHT 210 capsule 1   glucose blood test strip USE TO TEST BLOOD GLUCOSE UP TO 4 TIMES PER DAY, IN THE MORNING AND AFTER MEALS 100 strip 11   hydrALAZINE  (APRESOLINE) 25 MG tablet TAKE ONE TABLET BY MOUTH 2 TIMES A DAY 60 tablet 2   hydrochlorothiazide (HYDRODIURIL) 25 MG tablet TAKE ONE TABLET BY MOUTH EVERY DAY 90 tablet 1   insulin aspart (NOVOLOG FLEXPEN) 100 UNIT/ML FlexPen She states taking 5-7 units with meals and adjust with sliding scale as prescribed by Endocrinology.. (Patient taking differently: She states taking 5-9 units with meals and adjust with sliding scale as prescribed by Endocrinology.Marland Kitchen) 75 mL 3   insulin glargine (LANTUS) 100 UNIT/ML injection Inject 30 units under the skin daily at bedtime. 30 mL 0   Insulin Pen Needle (BD PEN NEEDLE NANO U/F) 32G X 4 MM MISC 5 times daily (Patient taking differently: 3 to 4 times daily) 200 each 12   Insulin Pen Needle (NOVOFINE PEN NEEDLE) 32G X 6 MM MISC use as directed 300 each PRN   Insulin Syringe-Needle U-100 31G X 15/64" 1 ML MISC AS DIRECTED 90 each 11   lisinopril (ZESTRIL) 40 MG tablet TAKE ONE TABLET BY MOUTH EVERY DAY 90 tablet 1   meloxicam (MOBIC) 7.5 MG tablet Take 1 tablet (7.5 mg total) by mouth once daily as needed for pain. 30 tablet 1   mirtazapine (REMERON) 30 MG tablet Take 1 tablet (30 mg total) by mouth at bedtime. 30 tablet 2   Rightest GL300 Lancets MISC USE AS DIRECTED 100 each 3   insulin glargine (LANTUS) 100 UNIT/ML Solostar Pen Inject 30 Units into the skin at bedtime. 30 mL 4   amLODipine (NORVASC) 10 MG tablet TAKE ONE TABLET BY MOUTH EVERY DAY 90 tablet 0   aspirin 81 MG EC tablet Take 1 tablet (81 mg total) by mouth daily. Swallow whole. 30 tablet 11   No facility-administered medications prior to visit.    Allergies  Allergen Reactions   Phenergan [Promethazine] Hives    ROS Review of Systems  Constitutional: Negative.   Eyes: Negative.   Respiratory: Negative.    Cardiovascular: Negative.   Neurological: Negative.      Objective:    Physical Exam HENT:     Head: Normocephalic and atraumatic.  Eyes:     Extraocular Movements: Extraocular  movements intact.     Conjunctiva/sclera: Conjunctivae normal.     Pupils: Pupils are equal, round, and reactive to light.  Cardiovascular:     Rate and Rhythm: Normal rate and regular rhythm.     Pulses: Normal pulses.     Heart sounds: Normal heart sounds.  Pulmonary:     Effort: Pulmonary effort is normal.     Breath sounds: Normal breath sounds.  Neurological:     General: No focal  deficit present.     Mental Status: She is alert and oriented to person, place, and time. Mental status is at baseline.    BP 112/76 (BP Location: Right Arm, Patient Position: Sitting, Cuff Size: Large)   Pulse 71   Temp (!) 97.2 F (36.2 C)   Resp 16   Ht 5' (1.524 m)   Wt 192 lb 1.6 oz (87.1 kg)   LMP 01/03/2016 (Within Years)   SpO2 95%   BMI 37.52 kg/m  Wt Readings from Last 3 Encounters:  06/23/21 192 lb 1.6 oz (87.1 kg)  05/19/21 195 lb 1.6 oz (88.5 kg)  01/21/21 194 lb 8 oz (88.2 kg)   Encouraged weight loss  Health Maintenance Due  Topic Date Due   COVID-19 Vaccine (1) Never done   HIV Screening  Never done   Hepatitis C Screening  Never done   TETANUS/TDAP  Never done   Zoster Vaccines- Shingrix (1 of 2) Never done   COLONOSCOPY (Pts 45-66yr Insurance coverage will need to be confirmed)  Never done   OPHTHALMOLOGY EXAM  03/23/2019   Pneumococcal Vaccine 066615Years old (2 - PCV) 08/06/2020   INFLUENZA VACCINE  06/15/2021    There are no preventive care reminders to display for this patient.  Lab Results  Component Value Date   TSH 0.951 07/31/2020   Lab Results  Component Value Date   WBC 9.2 08/02/2020   HGB 16.0 (H) 08/02/2020   HCT 48.5 (H) 08/02/2020   MCV 83.9 08/02/2020   PLT 328 08/02/2020   Lab Results  Component Value Date   NA 142 01/14/2021   K 3.9 01/14/2021   CO2 22 01/14/2021   GLUCOSE 155 (H) 01/14/2021   BUN 10 01/14/2021   CREATININE 0.94 01/14/2021   BILITOT 0.5 01/14/2021   ALKPHOS 133 (H) 01/14/2021   AST 18 01/14/2021   ALT 12  01/14/2021   PROT 7.4 01/14/2021   ALBUMIN 4.5 01/14/2021   CALCIUM 9.6 01/14/2021   ANIONGAP 11 08/02/2020   EGFR 73 01/14/2021   Lab Results  Component Value Date   CHOL 218 (H) 07/31/2020   Lab Results  Component Value Date   HDL 78 07/31/2020   Lab Results  Component Value Date   LDLCALC 109 (H) 07/31/2020   Lab Results  Component Value Date   TRIG 157 (H) 07/31/2020   Lab Results  Component Value Date   CHOLHDL 2.8 07/31/2020   Lab Results  Component Value Date   HGBA1C 8.3 (A) 05/19/2021      Assessment & Plan:   1. Primary hypertension -Her blood pressure was with normal limits during visit, though it was elevated at home -She will continue current medication, DASH diet and exercise as tolerated. -She was educated on signs and symptoms of stroke and advised to go to the emergency room and notify clinic.     Follow-up: Return in about 8 weeks (around 08/19/2021), or if symptoms worsen or fail to improve.    Devonte Migues EJerold Coombe NP

## 2021-06-24 ENCOUNTER — Telehealth: Payer: Self-pay | Admitting: Licensed Clinical Social Worker

## 2021-06-24 ENCOUNTER — Ambulatory Visit: Payer: Self-pay | Admitting: Licensed Clinical Social Worker

## 2021-06-24 NOTE — Telephone Encounter (Signed)
Called the patient twice during today's scheduled appointment; no answer, left a voicemail with the clinic contact information so they may reschedule.   

## 2021-07-07 ENCOUNTER — Other Ambulatory Visit: Payer: Self-pay

## 2021-07-07 ENCOUNTER — Ambulatory Visit: Payer: Self-pay | Admitting: Licensed Clinical Social Worker

## 2021-07-07 DIAGNOSIS — F331 Major depressive disorder, recurrent, moderate: Secondary | ICD-10-CM

## 2021-07-07 DIAGNOSIS — F4310 Post-traumatic stress disorder, unspecified: Secondary | ICD-10-CM

## 2021-07-07 NOTE — BH Specialist Note (Signed)
Integrated Behavioral Health Follow Up Telephone Visit  MRN: [id number] Name: Courtney Stafford   Total time: 30 minutes  Types of Service: Telephone visit Patient consents to telephone visit and 2 patient identifiers were used to identify patient   Interpretor:No. Interpretor Name and Language: N/A  Subjective: Courtney Stafford is a 50 y.o. female accompanied by  herself Patient was referred by Courtney Stafford for mental health. Patient reports the following symptoms/concerns: The patient noted that she has felt more depressed since her last follow-up appointment. She noted it was the anniversary of her son's death. She shared that it was also her birthday week. She explained that she spent the day crying and thinking of her son. She stated that she goes through this grieving day every year. Courtney Stafford stated that she feels lonely and only sees her daughter and grandchildren once a year. She noted that she lacks transportation and it makes it hard to go out and meet other people. She shared that she hopes she can get apporved for her charity care soon. The patient explained thtat she still needs physcial therapy so she can regain her ability to walk again. The patient shared she needs toilet paper and other basic hygiene supplies. The patient denied any suicidal or homicidal thoughts.  Duration of problem: Years; Severity of problem: moderate  Objective: Mood: Euthymic and Affect: Appropriate Risk of harm to self or others: No plan to harm self or others  Life Context: Family and Social: see above School/Work: see above Self-Care: see above Life Changes: above  Patient and/or Family's Strengths/Protective Factors: Concrete supports in place (healthy food, safe environments, etc.)  Goals Addressed: Patient will:  Reduce symptoms of: anxiety, depression, and stress   Increase knowledge and/or ability of: coping skills, healthy habits, self-management skills, and stress reduction    Demonstrate ability to: Increase healthy adjustment to current life circumstances  Progress towards Goals: Ongoing  Interventions: Interventions utilized:  Supportive Counselingwas utilized by the clinician during today's follow up session. The clinician processed with the patient how they have been doing since the last follow-up session. The clinician provided a space for the patient to ventilate their frustrations regarding their current life circumstances. Clinician measured the patient's anxiety and depression on a numerical scale.  Clinician met with patient to identify needs related to stressors and functioning, and assess and monitor for signs and symptoms of anxiety and depression, and assess safety.The clinician encouraged the patient to utilize their coping skills to deal with their current life circumstances.   Standardized Assessments completed: GAD-7 GAD-7   16 PHQ-9   15    Assessment: Patient currently experiencing see above.   Patient may benefit from see above.  Plan: Follow up with behavioral health clinician on : 07/16/2021 @ 4:00PM Behavioral recommendations:  Referral(s): [facility] (In Clinic) "From scale of 1-10, how likely are you to follow plan?":   Courtney Stafford, LCSWA

## 2021-07-14 ENCOUNTER — Other Ambulatory Visit: Payer: Self-pay

## 2021-07-14 ENCOUNTER — Other Ambulatory Visit: Payer: Self-pay | Admitting: Gerontology

## 2021-07-15 ENCOUNTER — Other Ambulatory Visit: Payer: Self-pay

## 2021-07-15 ENCOUNTER — Other Ambulatory Visit: Payer: Self-pay | Admitting: Gerontology

## 2021-07-15 DIAGNOSIS — E109 Type 1 diabetes mellitus without complications: Secondary | ICD-10-CM

## 2021-07-15 MED ORDER — INSULIN GLARGINE 100 UNIT/ML SOLOSTAR PEN
30.0000 [IU] | PEN_INJECTOR | Freq: Every day | SUBCUTANEOUS | 4 refills | Status: DC
  Filled 2021-07-15 – 2022-04-06 (×2): qty 30, 100d supply, fill #0

## 2021-07-15 MED ORDER — NOVOFINE PEN NEEDLE 32G X 6 MM MISC: 99 refills | Status: DC

## 2021-07-16 ENCOUNTER — Other Ambulatory Visit: Payer: Self-pay

## 2021-07-16 ENCOUNTER — Ambulatory Visit: Payer: Self-pay | Admitting: Licensed Clinical Social Worker

## 2021-07-16 DIAGNOSIS — F331 Major depressive disorder, recurrent, moderate: Secondary | ICD-10-CM

## 2021-07-16 DIAGNOSIS — F4310 Post-traumatic stress disorder, unspecified: Secondary | ICD-10-CM

## 2021-07-16 NOTE — BH Specialist Note (Signed)
Integrated Behavioral Health Follow Up Telephone Visit  MRN: [id number] Name: Courtney Stafford  Total time: 60 minutes  Types of Service: Telephone visit Patient consents to telephone visit and 2 patient identifiers were used to identify patient   Interpretor:No. Interpretor Name and Language: N/A  Subjective: Courtney Stafford is a 50 y.o. female accompanied by  herself Patient was referred by Hurman Horn, NP for mental health. Patient reports the following symptoms/concerns: The patient reports that she has been doing well since her last follow-up appointment. She explained that she has been waking up early because her landlord has been repairing the plumbing in her kitchen and that has been difficult for her. She shared that she does not have a set bedtime and usually wakes up later in the morning sometimes at lunchtime. She shared that she ha to sleep with background noise from the television or fan running, She stated that once she falls asleep she can usually stay asleep except for waking up to go to the bathroom occasionally. She explained that she needs resources for cleaning supplies and hygiene items. She discussed other health and financial stressors. The patient denied any suicidal or homicidal thoughts.  Duration of problem: Years; Severity of problem: moderate  Objective: Mood: Euthymic and Affect: Appropriate Risk of harm to self or others: No plan to harm self or others  Life Context: Family and Social: see above School/Work: see above Self-Care: see above Life Changes: see above  Patient and/or Family's Strengths/Protective Factors: Concrete supports in place (healthy food, safe environments, etc.)  Goals Addressed: Patient will:  Reduce symptoms of: anxiety, depression, and stress   Increase knowledge and/or ability of: coping skills, healthy habits, self-management skills, and stress reduction   Demonstrate ability to: Increase healthy adjustment to current  life circumstances and Increase adequate support systems for patient/family  Progress towards Goals: Ongoing  Interventions: Interventions utilized:  CBT Cognitive Behavioral Therapy was utilized by the clinician during today's follow up session. The clinician processed with the patient how they have been doing since the last follow-up session. The clinician provided a space for the patient to ventilate their frustrations regarding their current life circumstances. Clinician measured the patient's anxiety and depression on a numerical scale.  Clinician encouraged the patient to take their mediation at the same time everyday exactly as prescribed for it to reach it's full intended effect. The clinician encouraged the patient to utilize their coping skills to deal with their current life circumstances.  Clinician reassessed the patients pattern of sleep, bedtime routines, activities associated with the bed, activity and energy level while awake, night time snacking, stimulant use, daytime napping, total sleep amounts. Clinician explored the patients thoughts and associated emotions regarding sleep.  Standardized Assessments completed: GAD-7 and PHQ 9 GAD-7   10 PHQ-9   16  Assessment: Patient currently experiencing see above.   Patient may benefit from see above.  Plan: Follow up with behavioral health clinician on : 07/22/2021 at 4:00 PM  Behavioral recommendations:  Referral(s): [facility] (In Clinic) "From scale of 1-10, how likely are you to follow plan?":   Judith Part, LCSWA

## 2021-07-17 ENCOUNTER — Telehealth: Payer: Self-pay | Admitting: Pharmacist

## 2021-07-17 NOTE — Telephone Encounter (Signed)
07/17/2021 9:30:01 AM - Lantus Solostar request to provider  -- [name] - Friday, July 17, 2021 9:28 AM --Received printout on Lantus to change patient from Vials to Pens--Printed refill request for Lantus Solostar Inject 30 units daily at bedtime # 2 boxes, putting in [facility] folder.

## 2021-07-21 ENCOUNTER — Other Ambulatory Visit: Payer: Self-pay | Admitting: Gerontology

## 2021-07-21 ENCOUNTER — Other Ambulatory Visit: Payer: Self-pay

## 2021-07-21 DIAGNOSIS — F411 Generalized anxiety disorder: Secondary | ICD-10-CM

## 2021-07-21 MED ORDER — MIRTAZAPINE 30 MG PO TABS
30.0000 mg | ORAL_TABLET | Freq: Every day | ORAL | 2 refills | Status: DC
  Filled 2021-07-21: qty 30, 30d supply, fill #0

## 2021-07-21 MED ORDER — CYMBALTA 60 MG PO CPEP
ORAL_CAPSULE | Freq: Every morning | ORAL | 2 refills | Status: DC
Start: 2021-07-21 — End: 2022-01-13
  Filled 2021-07-21: qty 30, 30d supply, fill #0
  Filled 2021-10-13 – 2022-01-12 (×3): qty 30, 30d supply, fill #1

## 2021-07-22 ENCOUNTER — Ambulatory Visit: Payer: Self-pay | Admitting: Licensed Clinical Social Worker

## 2021-07-22 ENCOUNTER — Telehealth: Payer: Self-pay | Admitting: Pharmacist

## 2021-07-22 NOTE — Telephone Encounter (Signed)
07/22/2021 10:15:31 AM - [employer] refill for Lantus [employer] faxed  -- [name] - Wednesday, July 22, 2021 10:14 AM --Had received pharmacy printout to change patient from Vials to Pens-got Elizabeth to sign refill request and page 5 of application and faxed to [employer] for Lantus Solostar pens Inject 30 units daily at bedtime # 2 boxes.

## 2021-07-28 ENCOUNTER — Other Ambulatory Visit: Payer: Self-pay

## 2021-07-28 MED FILL — Glucose Blood Test Strip: 25 days supply | Qty: 100 | Fill #4 | Status: AC

## 2021-07-29 ENCOUNTER — Ambulatory Visit: Payer: Self-pay | Admitting: Licensed Clinical Social Worker

## 2021-07-29 ENCOUNTER — Other Ambulatory Visit: Payer: Self-pay

## 2021-08-04 ENCOUNTER — Other Ambulatory Visit: Payer: Self-pay

## 2021-08-04 ENCOUNTER — Telehealth: Payer: Self-pay | Admitting: Licensed Clinical Social Worker

## 2021-08-04 NOTE — Telephone Encounter (Signed)
Called patient to let her know that her appointment for 08/05/2021 was moved to 08/06/21 at 4:00 PM

## 2021-08-05 ENCOUNTER — Ambulatory Visit: Payer: Self-pay | Admitting: Licensed Clinical Social Worker

## 2021-08-05 ENCOUNTER — Other Ambulatory Visit: Payer: Self-pay

## 2021-08-06 ENCOUNTER — Ambulatory Visit: Payer: Self-pay | Admitting: Licensed Clinical Social Worker

## 2021-08-11 ENCOUNTER — Other Ambulatory Visit: Payer: Self-pay

## 2021-08-11 MED ORDER — NOVOFINE PEN NEEDLE 32G X 6 MM MISC: 99 refills | Status: DC

## 2021-08-12 ENCOUNTER — Other Ambulatory Visit: Payer: Self-pay

## 2021-08-12 ENCOUNTER — Ambulatory Visit: Payer: Self-pay | Admitting: Licensed Clinical Social Worker

## 2021-08-12 ENCOUNTER — Ambulatory Visit: Payer: Self-pay

## 2021-08-12 DIAGNOSIS — F4310 Post-traumatic stress disorder, unspecified: Secondary | ICD-10-CM

## 2021-08-12 DIAGNOSIS — F411 Generalized anxiety disorder: Secondary | ICD-10-CM

## 2021-08-12 NOTE — BH Specialist Note (Signed)
Integrated Behavioral Health Follow Up Telephone Visit  MRN: [id number] Name: Courtney Stafford   Total time: 30 minutes  Types of Service: Telephone visitPatient consents to telephone visit and 2 patient identifiers were used to identify patient   Interpretor:No. Interpretor Name and Language: N/A  Subjective: Courtney Stafford is a 50 y.o. female accompanied by  herself Patient was referred by Hurman Horn, NP  for mental health. Patient reports the following symptoms/concerns: The patient reports that she has been feeling more depressed since her last follow-up appointment. She explained that she has been thinking about her late son more and that makes her feel very sad and alone. She explained that since her stroke she is even more isolated from others. She shared the only time she leaves her home is to go to a doctor appointment or to the grocery store if she can find a ride once a month. She explained that she watches television and plays games on her telephone most of her day. She shared she use to have a friend a couple of years ago but she moved away. The patient noted that she has been having thoughts that she would be better off dead and that no one would no if she was gone. [name] denied having any plan to end her life or access to means to carry out a plan. She shared she would not kill herself because she still has a daughter and grandchildren and even though she does not see them would not want them to be hurt. She shared that she has stable safe housing, and food. She stated that if her thoughts of ending her life become worse or if she has a mental health crisis she knows she would call or text 988 or 911 to get help, Additionally she stated she knows she can go to [facility] crisis walk in or the closest emergency department for help. The patient denied any homicidal thoughts.  Duration of problem: Years; Severity of problem: moderate  Objective: Mood: Euthymic and Affect:  Appropriate Risk of harm to self or others: Suicidal ideation without a plan or access to means to carry out a plan.   Life Context: Family and Social: see above School/Work: see above Self-Care: see above Life Changes: see above  Patient and/or Family's Strengths/Protective Factors: Concrete supports in place (healthy food, safe environments, etc.)  Goals Addressed: Patient will:  Reduce symptoms of: agitation, anxiety, depression, and stress   Increase knowledge and/or ability of: coping skills, healthy habits, self-management skills, and stress reduction   Demonstrate ability to: Increase healthy adjustment to current life circumstances and Increase adequate support systems for patient/family  Progress towards Goals: Ongoing  Interventions: Interventions utilized:  CBT Cognitive Behavioral Therapy was utilized by the clinician during today's follow up session. The clinician processed with the patient how they have been doing since the last follow-up session. The clinician provided a space for the patient to ventilate their frustrations regarding their current life circumstances. Clinician worked with patient to identify needs related to stressors and functioning, and assess and monitor for signs and symptoms of anxiety and depression, and assess safety. Clinician measured the patient's anxiety and depression on a numerical scale.  Clinician introduced distress tolerance skills to the patient and explained that negative emotions will usually lessen in intensity and pass over time. Clinician completed a risk assessment to determine level of severity when it comes to patient's suicidal thoughts. Clinician determined that the patient is competent, thinking clearly, orientated x 3,  able to make own medical decisions, and is not acutely suicidal but if she were, she would access emergency crisis services.  Standardized Assessments completed: GAD-7 and PHQ 9 PHQ-9 =   19 GAD-7 =    18  Assessment: Patient currently experiencing see above.   Patient may benefit from see above.  Plan: Follow up with behavioral health clinician on : 08/19/2021 at 12:00 PM Behavioral recommendations:  Referral(s): [facility] [employer] (In Clinic) "From scale of 1-10, how likely are you to follow plan?":   Judith Part, LCSWA

## 2021-08-13 ENCOUNTER — Ambulatory Visit: Payer: Self-pay

## 2021-08-13 ENCOUNTER — Other Ambulatory Visit: Payer: Self-pay

## 2021-08-13 NOTE — Progress Notes (Unsigned)
Patient asked for toiletries and clothing but doesn't have transportation so I will explore options there. Patient will complete a new tax form next week at her appointment to work on charity care application. Patient didn't need any other resources. Can't afford to take the bus.

## 2021-08-14 ENCOUNTER — Other Ambulatory Visit: Payer: Self-pay

## 2021-08-19 ENCOUNTER — Other Ambulatory Visit: Payer: Self-pay

## 2021-08-19 ENCOUNTER — Encounter: Payer: Self-pay | Admitting: Licensed Clinical Social Worker

## 2021-08-19 ENCOUNTER — Encounter: Payer: Self-pay | Admitting: Gerontology

## 2021-08-19 ENCOUNTER — Ambulatory Visit: Payer: Self-pay | Admitting: Gerontology

## 2021-08-19 VITALS — BP 146/96 | HR 93 | Temp 97.3°F | Resp 16 | Ht 60.0 in | Wt 192.5 lb

## 2021-08-19 DIAGNOSIS — I10 Essential (primary) hypertension: Secondary | ICD-10-CM

## 2021-08-19 DIAGNOSIS — E109 Type 1 diabetes mellitus without complications: Secondary | ICD-10-CM

## 2021-08-19 LAB — GLUCOSE, POCT (MANUAL RESULT ENTRY): POC Glucose: 283 mg/dl — AB (ref 70–99)

## 2021-08-19 LAB — POCT GLYCOSYLATED HEMOGLOBIN (HGB A1C): Hemoglobin A1C: 8 % — AB (ref 4.0–5.6)

## 2021-08-19 MED ORDER — NOVOFINE PEN NEEDLE 32G X 6 MM MISC
99 refills | Status: DC
Start: 2021-08-19 — End: 2022-08-12
  Filled 2021-08-19 – 2021-08-25 (×2): qty 400, 100d supply, fill #0
  Filled 2021-11-12: qty 400, 100d supply, fill #1
  Filled 2022-02-24: qty 100, 25d supply, fill #2
  Filled 2022-03-25: qty 300, 75d supply, fill #3

## 2021-08-19 MED ORDER — NOVOLOG FLEXPEN 100 UNIT/ML ~~LOC~~ SOPN
5.0000 [IU] | PEN_INJECTOR | Freq: Three times a day (TID) | SUBCUTANEOUS | 3 refills | Status: DC
  Filled 2021-08-19: qty 75, 143d supply, fill #0
  Filled 2021-11-12: qty 30, 100d supply, fill #0
  Filled 2022-03-15 – 2022-03-26 (×2): qty 30, 100d supply, fill #1
  Filled 2022-04-06: qty 30, 100d supply, fill #0
  Filled 2022-07-13: qty 30, 100d supply, fill #1
  Filled ????-??-??: fill #1

## 2021-08-19 NOTE — Patient Instructions (Signed)
Carbohydrate Counting for Diabetes Mellitus, Adult Carbohydrate counting is a method of keeping track of how many carbohydrates you eat. Eating carbohydrates naturally increases the amount of sugar (glucose) in the blood. Counting how many carbohydrates you eat improves your blood glucose control, which helps you manage your diabetes. It is important to know how many carbohydrates you can safely have in each meal. This is different for every person. A dietitian can help you make a meal plan and calculate how many carbohydrates you should have at each meal and snack. What foods contain carbohydrates? Carbohydrates are found in the following foods: Grains, such as breads and cereals. Dried beans and soy products. Starchy vegetables, such as potatoes, peas, and corn. Fruit and fruit juices. Milk and yogurt. Sweets and snack foods, such as cake, cookies, candy, chips, and soft drinks. How do I count carbohydrates in foods? There are two ways to count carbohydrates in food. You can read food labels or learn standard serving sizes of foods. You can use either of the methods or a combination of both. Using the Nutrition Facts label The Nutrition Facts list is included on the labels of almost all packaged foods and beverages in the [place] It includes: The serving size. Information about nutrients in each serving, including the grams (g) of carbohydrate per serving. To use the Nutrition Facts: Decide how many servings you will have. Multiply the number of servings by the number of carbohydrates per serving. The resulting number is the total amount of carbohydrates that you will be having. Learning the standard serving sizes of foods When you eat carbohydrate foods that are not packaged or do not include Nutrition Facts on the label, you need to measure the servings in order to count the amount of carbohydrates. Measure the foods that you will eat with a food scale or measuring cup, if needed. Decide how  many standard-size servings you will eat. Multiply the number of servings by 15. For foods that contain carbohydrates, one serving equals 15 g of carbohydrates. For example, if you eat 2 cups or 10 oz (300 g) of strawberries, you will have eaten 2 servings and 30 g of carbohydrates (2 servings x 15 g = 30 g). For foods that have more than one food mixed, such as soups and casseroles, you must count the carbohydrates in each food that is included. The following list contains standard serving sizes of common carbohydrate-rich foods. Each of these servings has about 15 g of carbohydrates: 1 slice of bread. 1 six-inch (15 cm) tortilla. ? cup or 2 oz (53 g) cooked rice or pasta.  cup or 3 oz (85 g) cooked or canned, drained and rinsed beans or lentils.  cup or 3 oz (85 g) starchy vegetable, such as peas, corn, or squash.  cup or 4 oz (120 g) hot cereal.  cup or 3 oz (85 g) boiled or mashed potatoes, or  or 3 oz (85 g) of a large baked potato.  cup or 4 fl oz (118 mL) fruit juice. 1 cup or 8 fl oz (237 mL) milk. 1 small or 4 oz (106 g) apple.  or 2 oz (63 g) of a medium banana. 1 cup or 5 oz (150 g) strawberries. 3 cups or 1 oz (24 g) popped popcorn. What is an example of carbohydrate counting? To calculate the number of carbohydrates in this sample meal, follow the steps shown below. Sample meal 3 oz (85 g) chicken breast. ? cup or 4 oz (106 g) brown   rice.  cup or 3 oz (85 g) corn. 1 cup or 8 fl oz (237 mL) milk. 1 cup or 5 oz (150 g) strawberries with sugar-free whipped topping. Carbohydrate calculation Identify the foods that contain carbohydrates: Rice. Corn. Milk. Strawberries. Calculate how many servings you have of each food: 2 servings rice. 1 serving corn. 1 serving milk. 1 serving strawberries. Multiply each number of servings by 15 g: 2 servings rice x 15 g = 30 g. 1 serving corn x 15 g = 15 g. 1 serving milk x 15 g = 15 g. 1 serving strawberries x 15 g = 15  g. Add together all of the amounts to find the total grams of carbohydrates eaten: 30 g + 15 g + 15 g + 15 g = 75 g of carbohydrates total. What are tips for following this plan? Shopping Develop a meal plan and then make a shopping list. Buy fresh and frozen vegetables, fresh and frozen fruit, dairy, eggs, beans, lentils, and whole grains. Look at food labels. Choose foods that have more fiber and less sugar. Avoid processed foods and foods with added sugars. Meal planning Aim to have the same amount of carbohydrates at each meal and for each snack time. Plan to have regular, balanced meals and snacks. Where to find more information American Diabetes Association: www.diabetes.org Centers for Disease Control and Prevention: www.cdc.gov Summary Carbohydrate counting is a method of keeping track of how many carbohydrates you eat. Eating carbohydrates naturally increases the amount of sugar (glucose) in the blood. Counting how many carbohydrates you eat improves your blood glucose control, which helps you manage your diabetes. A dietitian can help you make a meal plan and calculate how many carbohydrates you should have at each meal and snack. This information is not intended to replace advice given to you by your health care provider. Make sure you discuss any questions you have with your health care provider. Document Revised: 11/01/2019 Document Reviewed: 11/02/2019 Elsevier Patient Education  2021 Elsevier Inc.  

## 2021-08-19 NOTE — Progress Notes (Signed)
Established Patient Office Visit  Subjective:  Patient ID: Courtney Stafford, female    DOB: Sep 10, 1971  Age: 50 y.o. MRN: [id number]  CC:  Chief Complaint  Patient presents with   Follow-up   Diabetes    Patient states she is checking her blood sugars at home and brought her log to her appointment today. Patient states FBS this morning was 66.   Hypertension    Patient is checking her blood pressure at home and ranges 130s/80s.    HPI Courtney Stafford is a 50 y/o female who has history of T1DM, Hypertension, Stroke, presents for routine follow up and lab review. Her HgbA1c done during visit decreased from 8.3% to 8%. She states that she's compliant with her medications, denies side effects and continues to make healthy lifestyle changes. She checks her blood glucose tid, her fasting reading ranges between 62-139 mg/dl, pre dinner at 6 pm readings are between 130-243 mg/dl and reading at 10 pm was 116- 306 mg/dl. She denies hypoglycemic symptoms, and admits to experiencing polyuria.  She states peripheral neuropathy is under control with taking gabapentin.  She states that reading this morning was 66 mg per DL, and her reading during visit was 283 mg per DL.  Her blood pressure was elevated during visit, she states that she has not taking her anti hypertensive medication prior to her clinic visit. Overall, she states that she is doing well and offers no further complaint.   Past Medical History:  Diagnosis Date   Diabetes mellitus without complication ([place])    Hypertension    Stroke ([place]) 07/31/2020    Past Surgical History:  Procedure Laterality Date   CARPAL TUNNEL RELEASE  6 years ago   both hands    Family History  Problem Relation Age of Onset   Other Mother        unknown medical history   Cancer Father    Diabetes Maternal Grandmother    Breast cancer Neg Hx     Social History   Socioeconomic History   Marital status: Single    Spouse name: Not on file   Number  of children: 2   Years of education: Not on file   Highest education level: High school graduate  Occupational History   Occupation: unemployed  Tobacco Use   Smoking status: Some Days    Packs/day: 0.01    Types: Cigarettes   Smokeless tobacco: Never   Tobacco comments:    occasional  Vaping Use   Vaping Use: Never used  Substance and Sexual Activity   Alcohol use: No   Drug use: No   Sexual activity: Not on file  Other Topics Concern   Not on file  Social History Narrative   Completed biopsychosocial assessment, social determinants screening, administered PHQ 9, and GAD 7.       Social determinants screening completed 01/31/2020. HS She receives [facility] for transportation. She receives food stamps 192 per month and utilizes a Building surveyor when needed. [employer] pays for housing and utilities.  She follows up with therapist at clinic for stress, anxiety, and depression. HS      Support system is small.   Social Determinants of Health   Financial Resource Strain: Medium Risk   Difficulty of Paying Living Expenses: Somewhat hard  Food Insecurity: No Food Insecurity   Worried About Running Out of Food in the Last Year: Never true   Ran Out of Food in the  Last Year: Never true  Transportation Needs: Public librarian (Medical): Yes   Lack of Transportation (Non-Medical): Yes  Physical Activity: Inactive   Days of Exercise per Week: 0 days   Minutes of Exercise per Session: 0 min  Stress: Not on file  Social Connections: Socially Isolated   Frequency of Communication with Friends and Family: Once a week   Frequency of Social Gatherings with Friends and Family: Once a week   Attends Religious Services: Never   Marine scientist or Organizations: Yes   Attends Archivist Meetings: Never   Marital Status: Separated  Intimate Partner Violence: Not At Risk   Fear of Current or Ex-Partner: No    Emotionally Abused: No   Physically Abused: No   Sexually Abused: No    Outpatient Medications Prior to Visit  Medication Sig Dispense Refill   amLODipine (NORVASC) 10 MG tablet TAKE ONE TABLET BY MOUTH EVERY DAY 90 tablet 1   aspirin 81 MG EC tablet Take 1 tablet (81 mg total) by mouth daily. 30 tablet 11   atorvastatin (LIPITOR) 80 MG tablet TAKE ONE TABLET BY MOUTH EVERY DAY 90 tablet 3   Blood Glucose Monitoring Suppl Supplies MISC 1 each by Does not apply route 5 (five) times daily. (Patient taking differently: 1 each by Does not apply route. Check 3 to 4 times daily) 500 each 4   carvedilol (COREG) 12.5 MG tablet TAKE ONE TABLET BY MOUTH 2 TIMES A DAY WITH A MEAL 60 tablet 3   CYMBALTA 60 MG capsule TAKE ONE CAPSULE BY MOUTH EVERY MORNING 30 capsule 2   gabapentin (NEURONTIN) 300 MG capsule TAKE 2 CAPSULES (600MG) BY MOUTH 2 TIMES A DAY AND 3 CAPSULES (900MG) AT NIGHT 210 capsule 1   glucose blood test strip USE TO TEST BLOOD GLUCOSE UP TO 4 TIMES PER DAY, IN THE MORNING AND AFTER MEALS 100 strip 11   hydrALAZINE (APRESOLINE) 25 MG tablet TAKE ONE TABLET BY MOUTH 2 TIMES A DAY 60 tablet 2   hydrochlorothiazide (HYDRODIURIL) 25 MG tablet TAKE ONE TABLET BY MOUTH EVERY DAY 90 tablet 1   insulin glargine (LANTUS) 100 UNIT/ML Solostar Pen Inject 30 Units into the skin once daily at bedtime. 30 mL 4   Insulin Pen Needle (BD PEN NEEDLE NANO U/F) 32G X 4 MM MISC 5 times daily (Patient taking differently: 3 to 4 times daily) 200 each 12   Insulin Syringe-Needle U-100 31G X 15/64" 1 ML MISC AS DIRECTED 90 each 11   lisinopril (ZESTRIL) 40 MG tablet TAKE ONE TABLET BY MOUTH EVERY DAY 90 tablet 1   meloxicam (MOBIC) 7.5 MG tablet Take 1 tablet (7.5 mg total) by mouth once daily as needed for pain. 30 tablet 1   mirtazapine (REMERON) 30 MG tablet Take 1 tablet (30 mg total) by mouth once daily at bedtime. 30 tablet 2   Rightest GL300 Lancets MISC USE AS DIRECTED 100 each 3   insulin aspart (NOVOLOG  FLEXPEN) 100 UNIT/ML FlexPen She states taking 5-7 units with meals and adjust with sliding scale as prescribed by Endocrinology.. (Patient taking differently: She states taking 5-9 units with meals and adjust with sliding scale as prescribed by Endocrinology.Marland Kitchen) 75 mL 3   Insulin Pen Needle (NOVOFINE PEN NEEDLE) 32G X 6 MM MISC USE AS DIRECTED. 400 each PRN   No facility-administered medications prior to visit.    Allergies  Allergen Reactions   Phenergan [Promethazine] Hives  ROS Review of Systems  Constitutional: Negative.   Eyes: Negative.   Respiratory: Negative.    Cardiovascular: Negative.   Endocrine: Positive for polyuria.  Skin: Negative.   Neurological: Negative.   Psychiatric/Behavioral: Negative.       Objective:    Physical Exam HENT:     Head: Normocephalic and atraumatic.     Mouth/Throat:     Mouth: Mucous membranes are moist.  Eyes:     Extraocular Movements: Extraocular movements intact.     Conjunctiva/sclera: Conjunctivae normal.     Pupils: Pupils are equal, round, and reactive to light.  Cardiovascular:     Rate and Rhythm: Normal rate and regular rhythm.     Pulses: Normal pulses.     Heart sounds: Normal heart sounds.  Pulmonary:     Effort: Pulmonary effort is normal.     Breath sounds: Normal breath sounds.  Skin:    General: Skin is warm.  Neurological:     General: No focal deficit present.     Mental Status: She is alert and oriented to person, place, and time. Mental status is at baseline.  Psychiatric:        Mood and Affect: Mood normal.        Behavior: Behavior normal.        Thought Content: Thought content normal.        Judgment: Judgment normal.    BP (!) 146/96 (BP Location: Right Arm, Patient Position: Sitting, Cuff Size: Normal) Comment: manual  Pulse 93   Temp (!) 97.3 F (36.3 C)   Resp 16   Ht 5' (1.524 m)   Wt 192 lb 8 oz (87.3 kg)   LMP 01/03/2016 (Within Years)   SpO2 95%   BMI 37.60 kg/m  Wt Readings  from Last 3 Encounters:  08/19/21 192 lb 8 oz (87.3 kg)  08/19/21 192 lb 8 oz (87.3 kg)  06/23/21 192 lb 1.6 oz (87.1 kg)   Encouraged weight loss  Health Maintenance Due  Topic Date Due   COVID-19 Vaccine (1) Never done   HIV Screening  Never done   Hepatitis C Screening  Never done   TETANUS/TDAP  Never done   Zoster Vaccines- Shingrix (1 of 2) Never done   COLONOSCOPY (Pts 45-84yr Insurance coverage will need to be confirmed)  Never done   OPHTHALMOLOGY EXAM  03/23/2019   INFLUENZA VACCINE  06/15/2021    There are no preventive care reminders to display for this patient.  Lab Results  Component Value Date   TSH 0.951 07/31/2020   Lab Results  Component Value Date   WBC 9.2 08/02/2020   HGB 16.0 (H) 08/02/2020   HCT 48.5 (H) 08/02/2020   MCV 83.9 08/02/2020   PLT 328 08/02/2020   Lab Results  Component Value Date   NA 142 01/14/2021   K 3.9 01/14/2021   CO2 22 01/14/2021   GLUCOSE 155 (H) 01/14/2021   BUN 10 01/14/2021   CREATININE 0.94 01/14/2021   BILITOT 0.5 01/14/2021   ALKPHOS 133 (H) 01/14/2021   AST 18 01/14/2021   ALT 12 01/14/2021   PROT 7.4 01/14/2021   ALBUMIN 4.5 01/14/2021   CALCIUM 9.6 01/14/2021   ANIONGAP 11 08/02/2020   EGFR 73 01/14/2021   Lab Results  Component Value Date   CHOL 218 (H) 07/31/2020   Lab Results  Component Value Date   HDL 78 07/31/2020   Lab Results  Component Value Date   LDLCALC 109 (H) 07/31/2020  Lab Results  Component Value Date   TRIG 157 (H) 07/31/2020   Lab Results  Component Value Date   CHOLHDL 2.8 07/31/2020   Lab Results  Component Value Date   HGBA1C 8.0 (A) 08/19/2021      Assessment & Plan:    1. Type 1 diabetes mellitus without complication (HCC) -Her hemoglobin A1c was 8% and her goal should be less than 6%.  She will continue on 5 units of NovoLog plus sliding scale.  She will continue on current medication, low carbohydrate/no concentrated sweet diet and exercise as  tolerated. - POCT HgB A1C; Future - POCT Glucose (CBG); Future - POCT Glucose (CBG) - Insulin Pen Needle (NOVOFINE PEN NEEDLE) 32G X 6 MM MISC; USE AS DIRECTED.  Dispense: 400 each; Refill: PRN - insulin aspart (NOVOLOG FLEXPEN) 100 UNIT/ML FlexPen; Inject 5 Units into the skin 3 (three) times daily with meals. Add 1 unit 200-250         2 units 251-300         3 units 301-350         4 units 351-400 Take 10 units if over 400, recheck in 2 hours and notify clinic or go to the ED.  Dispense: 75 mL; Refill: 3 - POCT HgB A1C  2. Primary hypertension -Her blood pressure is not under control, her goal should be less than 140/90.  She will continue on current medication, DASH diet and exercise as tolerated.     Follow-up: Return in about 3 months (around 11/19/2021), or if symptoms worsen or fail to improve.    Chioma Jerold Coombe, NP

## 2021-08-20 ENCOUNTER — Telehealth: Payer: Self-pay | Admitting: Licensed Clinical Social Worker

## 2021-08-20 ENCOUNTER — Encounter: Payer: Self-pay | Admitting: General Surgery

## 2021-08-20 NOTE — Telephone Encounter (Signed)
Returned the patients call and answered her questions regarding documentation requirements for  the [facility] application.

## 2021-08-21 ENCOUNTER — Other Ambulatory Visit: Payer: Self-pay

## 2021-08-24 ENCOUNTER — Other Ambulatory Visit: Payer: Self-pay

## 2021-08-24 MED FILL — Glucose Blood Test Strip: 25 days supply | Qty: 100 | Fill #5 | Status: AC

## 2021-08-25 ENCOUNTER — Other Ambulatory Visit: Payer: Self-pay | Admitting: Gerontology

## 2021-08-25 ENCOUNTER — Other Ambulatory Visit: Payer: Self-pay

## 2021-08-25 DIAGNOSIS — F411 Generalized anxiety disorder: Secondary | ICD-10-CM

## 2021-08-25 MED ORDER — MIRTAZAPINE 15 MG PO TABS
15.0000 mg | ORAL_TABLET | Freq: Every day | ORAL | 0 refills | Status: DC
  Filled 2021-08-25 – 2021-10-13 (×2): qty 30, 30d supply, fill #0

## 2021-08-26 ENCOUNTER — Other Ambulatory Visit: Payer: Self-pay

## 2021-08-26 ENCOUNTER — Ambulatory Visit: Payer: Self-pay | Admitting: Licensed Clinical Social Worker

## 2021-08-26 DIAGNOSIS — F411 Generalized anxiety disorder: Secondary | ICD-10-CM

## 2021-08-26 DIAGNOSIS — F331 Major depressive disorder, recurrent, moderate: Secondary | ICD-10-CM

## 2021-08-26 DIAGNOSIS — F4310 Post-traumatic stress disorder, unspecified: Secondary | ICD-10-CM

## 2021-08-26 NOTE — BH Specialist Note (Signed)
Integrated Behavioral Health Follow Up Telephone Visit  MRN: [id number] Name: Courtney Stafford   Total time: 60 minutes  Types of Service: Telephone visit Patient consents to telephone visit and 2 patient identifiers were used to identify patient   Interpretor:No. Interpretor Name and Language: N/A  Subjective: Courtney Stafford is a 50 y.o. female accompanied by  N/A Patient was referred by [name], NP for Mental Health. Patient reports the following symptoms/concerns: The patient reports that she is doing the same since her last follow-up appointment. She shared that she has meals delivered to her once a week from a local program and that they come frozen. Courtney Stafford stated that she was gifted a microwave and is now able to heat up her meals. She shared she is in need of toiletries such as soap, toilet paper, and cleaning products. She shared that she is unable to afford additional items that her food stamps will not cover. Courtney Stafford asked if clinician could arrange transportation for her to come to the clinic if she needed to come in this month. Courtney Stafford shared that she does not leave her house anymore except to come to doctors appointment, go to the store once a month, or to go to Medication management to pick up her prescriptions. She noted that she only sees her daughter once a year for an hour or two usually around the holidays. Courtney Stafford denied any homicidal or suicidal thoughts.  Duration of problem: Years; Severity of problem: moderate  Objective: Mood: Euthymic and Affect: Appropriate Risk of harm to self or others: No plan to harm self or others  Life Context: Family and Social: See above  School/Work: See above Self-Care: See above Life Changes: See above  Patient and/or Family's Strengths/Protective Factors: Social connections  Goals Addressed: Patient will:  Reduce symptoms of: agitation, anxiety, depression, and stress   Increase knowledge and/or ability of: coping  skills, healthy habits, self-management skills, and stress reduction   Demonstrate ability to: Increase healthy adjustment to current life circumstances and Increase adequate support systems for patient/family  Progress towards Goals: Ongoing  Interventions: Interventions utilized:  CBT Cognitive Behavioral Therapy was utilized by the clinician during today's follow up session. Clinician met with patient to identify needs related to stressors and functioning, and assess and monitor for signs and symptoms of anxiety and depression, and assess safety. The clinician processed with the patient how they have been doing since the last follow-up session. Clinician explained the psychiatric case consultant's recommendations for were for the patient to reduce Mirtazapine from 30 MG to 15 MG daily. Clinician utilized guidance and supervision, provided by psychiatric consultant, to inform patient of the benefits and risks of psychotropic medication use including a verbal summary of Black Box warnings. Patient was given the opportunity to ask questions and address concerns regarding the psychiatric consultants recommendations for treatment. Clinician encouraged the patient to decrease the amount of time she spends isolating in her home daily by engaging with free resources such as using public transportation to go to [employer], parks, and community centers. Clinician referred the patient to Perry [place], MSW Intern for additional community resources.  Standardized Assessments completed: GAD-7 and PHQ 9 GAD-7 =   18 PHQ-9 =   18  Assessment: Patient currently experiencing see above.   Patient may benefit from see above.  Plan: Follow up with behavioral health clinician on : 09/03/21 at 3:00 Pm  Behavioral recommendations:  Referral(s): [facility] (In Clinic) "From scale of 1-10,  how likely are you to follow plan?":  Lesli Albee, LCSWA

## 2021-08-27 ENCOUNTER — Telehealth: Payer: Self-pay | Admitting: Licensed Clinical Social Worker

## 2021-08-27 NOTE — Telephone Encounter (Signed)
Patient was referred to the [employer] for clothing and toiletries and [employer] and given all the information for that. Patient has a transportation barrier so was referred to [employer] transportation. Though we know she uses this transportation, she reported not having heard of this resource before. We may have used a different name for the resource than what she is familiar with. Transportation is still a barrier with the clothing piece since the location she would receive a clothing voucher at is separate from the clothing store location. Will follow up on 10/13. -BG

## 2021-08-28 ENCOUNTER — Other Ambulatory Visit: Payer: Self-pay

## 2021-09-03 ENCOUNTER — Ambulatory Visit: Payer: Self-pay | Admitting: Licensed Clinical Social Worker

## 2021-09-03 ENCOUNTER — Other Ambulatory Visit: Payer: Self-pay

## 2021-09-03 DIAGNOSIS — F331 Major depressive disorder, recurrent, moderate: Secondary | ICD-10-CM

## 2021-09-03 DIAGNOSIS — F4310 Post-traumatic stress disorder, unspecified: Secondary | ICD-10-CM

## 2021-09-03 DIAGNOSIS — F411 Generalized anxiety disorder: Secondary | ICD-10-CM

## 2021-09-03 NOTE — BH Specialist Note (Signed)
Integrated Behavioral Health Follow Up In-Person Visit  MRN: [id number] Name: Courtney Stafford   Total time: 60 minutes  Types of Service: Telephone visit Patient consents to telephone visit and 2 patient identifiers were used to identify patient   Interpretor:No. Interpretor Name and Language:N/A  Subjective: [name] is a 50 y.o. female accompanied by  herself Patient was referred by [name], NP for Mental Heath. Patient reports the following symptoms/concerns: The patient reports that she has been doing well since her last follow-up appointment. Hetal noted that she does have heat and is able to stay warm in the cold weather. She explained that she never leaves her home except to go to her doctor appointments, pick up her medication or a once a month trip to the store to buy food. The patient shared that she is lonely sometimes, but is not able to get out to make friends. She shared that if she was able to afford a vehicle she would be afraid to drive because of her poor health. Melaya discussed financial stressors impacting her life. She noted that she is ready to file disability but is afraid to lose her housing which is funded through [employer] shared that her son was shot when he was a teenager and that he became wheelchair bound. She noted that afterwards he would have bedsores and would be in and out of hospitals and rehab facilities. She shared he dies three years ago at the age of 52 years old. She noted that his birthday is coming up on October 25th and she has spent a lot more time thinking about him and crying. She noted that this time of year is the worst because of his birthday and the holidays. She noted that she has none to talk to about how she feels. She reported that medication management did not give her mirtazapine last week when she went to pick up her medication. She asked if the clinician could find out why she was not given all her medication. Jenyfer  denied any suicidal or homicidal thoughts.   Duration of problem: Years; Severity of problem: moderate  Objective: Mood: Euthymic and Affect: Appropriate Risk of harm to self or others: No plan to harm self or others  Life Context: Family and Social: see above School/Work: see above Self-Care: see above Life Changes: see above  Patient and/or Family's Strengths/Protective Factors: Concrete supports in place (healthy food, safe environments, etc.)  Goals Addressed: Patient will:  Reduce symptoms of: agitation, anxiety, depression, and stress   Increase knowledge and/or ability of: coping skills, healthy habits, self-management skills, and stress reduction   Demonstrate ability to: Increase healthy adjustment to current life circumstances and Increase adequate support systems for patient/family  Progress towards Goals: Ongoing  Interventions: Interventions utilized:  CBT Cognitive Behavioral Therapywas utilized by the clinician during today's follow up session. Clinician met with patient to identify needs related to stressors and functioning, and assess and monitor for signs and symptoms of anxiety and depression, and assess safety. The clinician processed with the patient how they have been doing since the last follow-up session. Clinician processed with the patient distress tolerance skills and explained that negative emotions will usually lessen in intensity and pass over time. Clinician encouraged the client to consider joining a support group to connect to others who have lost a child. Clinician encouraged the client to call medication management regarding her refill; further, clinician messaged the patient's medication prescriber regarding her refill concerns.  Standardized Assessments  completed: GAD-7 and PHQ 9 GAD-7=    18 PHQ-9 =   16  Assessment: Patient currently experiencing see above.   Patient may benefit from see above   Plan: Follow up with behavioral health  clinician on : 10/11/21 at 3:00 PM  Behavioral recommendations:  Referral(s): [place] (In Clinic) "From scale of 1-10, how likely are you to follow plan?":   Lesli Albee, LCSWA

## 2021-09-10 ENCOUNTER — Telehealth: Payer: Self-pay | Admitting: Licensed Clinical Social Worker

## 2021-09-10 ENCOUNTER — Other Ambulatory Visit: Payer: Self-pay

## 2021-09-10 ENCOUNTER — Ambulatory Visit: Payer: Self-pay | Admitting: Licensed Clinical Social Worker

## 2021-09-10 DIAGNOSIS — F411 Generalized anxiety disorder: Secondary | ICD-10-CM

## 2021-09-10 DIAGNOSIS — F331 Major depressive disorder, recurrent, moderate: Secondary | ICD-10-CM

## 2021-09-10 DIAGNOSIS — F4310 Post-traumatic stress disorder, unspecified: Secondary | ICD-10-CM

## 2021-09-10 NOTE — BH Specialist Note (Signed)
Integrated Behavioral Health Follow Up Telephone Visit  MRN: [id number] Name: Courtney Stafford   Total time: 60 minutes  Types of Service: Telephone visit Patient consents to telephone visit and 2 patient identifiers were used to identify patient   Interpretor:No. Interpretor Name and Language: N/A  Subjective: Courtney Stafford is a 50 y.o. female accompanied by  herself Patient was referred by [name], NP  for Mental Health. Patient reports the following symptoms/concerns: The patient reports that she has been doing okay this week. She shared that she has her good and bad days but overall has been okay this week. Courtney Stafford shared that she spent a lot of time thinking about her son, but feels that her depression has lifted some so she can breath a little easier. She noted that she continues to struggle to fall asleep and often play games on her phone or watches television until she falls asleep right before the sun comes up. She noted that she wakes up often to go the bathroom and sometimes has a hard time getting back to sleep. She noted she may stay up and have a snack. She explained that is not taking naps in the late afternoon anymore. She noted that she does snore, but denied having any nightmares, or sleep walking. The patient shared she sleeps an average of seven hours total. Courtney Stafford shared that she tried to walk to the store today but turned around because she was afraid she would miss today's session. She noted that she tried to walk to the store three time this week but could not make it all the way there. She noted once she got cold and went home and the other time she got dizzy and was afraid she might fall. Courtney Stafford shared that she has not heard for the social work intern regarding resources yet. She stated that she needs toiletries and cleaning items. In addition Courtney Stafford stated she would like help finding resources to help her file disability. The patient denied any suicidal or  homicidal thoughts.  Duration of problem: Years; Severity of problem: moderate  Objective: Mood: Euthymic and Affect: Appropriate Risk of harm to self or others: No plan to harm self or others  Life Context: Family and Social: see above School/Work: see above Self-Care: see above Life Changes: see above  Patient and/or Family's Strengths/Protective Factors: Concrete supports in place (healthy food, safe environments, etc.)  Goals Addressed: Patient will:  Reduce symptoms of: agitation, anxiety, compulsions, depression, insomnia, and stress   Increase knowledge and/or ability of: coping skills, healthy habits, self-management skills, and stress reduction   Demonstrate ability to: Increase healthy adjustment to current life circumstances and Increase adequate support systems for patient/family  Progress towards Goals: Ongoing  Interventions: Interventions utilized:  CBT Cognitive Behavioral Therapy and Sleep Hygiene was utilized by the clinician during today's follow up session. Clinician met with patient to identify needs related to stressors and functioning, and assess and monitor for signs and symptoms of anxiety and depression, and assess safety. The clinician processed with the patient how they have been doing since the last follow-up session. Clinician assessed the patients pattern of sleep, bedtime routines, activities associated with the bed, activity and energy level while awake, night time snacking, stimulant use, daytime napping, total sleep amounts. Clinician explored the patients thoughts and associated emotions regarding sleep. Clinician reviewed with the patient the basics of sleep hygiene and the concept of advancing her bedtime by two hours each week until she is able to sleep  at nighttime again. Clinician scheduled the client through the holidays and explained how the patient can receive crisis care when the clinic is closed on thanksgiving and the week of Christmas.  Clinician offered to check on the progress on her case management requests.   Standardized Assessments completed: GAD-7 and PHQ 9 GAD-7 =  12 PHQ-9 =  18  Assessment: Patient currently experiencing see above.   Patient may benefit from see above.  Plan: Follow up with behavioral health clinician on : 09/23/2021 at 3:00 PM  Behavioral recommendations:  Referral(s): [facility] (In Clinic) "From scale of 1-10, how likely are you to follow plan?":   Lesli Albee, LCSWA

## 2021-09-10 NOTE — Telephone Encounter (Signed)
Called 10/27 and [employer] information for transportation and [facility] information for clothing. Told her I am still looking for options and will call back if I find anything else that's easier than the [employer] process.

## 2021-09-23 ENCOUNTER — Ambulatory Visit: Payer: Self-pay | Admitting: Licensed Clinical Social Worker

## 2021-09-23 ENCOUNTER — Other Ambulatory Visit: Payer: Self-pay

## 2021-09-23 DIAGNOSIS — F331 Major depressive disorder, recurrent, moderate: Secondary | ICD-10-CM

## 2021-09-23 DIAGNOSIS — F411 Generalized anxiety disorder: Secondary | ICD-10-CM

## 2021-09-23 DIAGNOSIS — F4310 Post-traumatic stress disorder, unspecified: Secondary | ICD-10-CM

## 2021-09-23 NOTE — BH Specialist Note (Signed)
Integrated Behavioral Health Follow Up Telephone Visit  MRN: [id number] Name: Courtney Stafford  Total time: 60 minutes  Types of Service: Telephone visit Patient consents to telephone visit and 2 patient identifiers were used to identify patient   Interpretor:No. Interpretor Name and Language: N/A  Subjective: Courtney Stafford is a 49 y.o. female accompanied by  herself Patient was referred by [name], NP for mental Health. Patient reports the following symptoms/concerns: The patient reports that she has been doing okay since her last follow-up visit. She shared that she was able to walk to the shelter and get a hot lunch and some food. She noted that she enjoyed talking to other people and noted that some people remembered her from last year. She shared that she was able to walk to the [employer]. She shared that she had to stop several times along the way to rest. [name] discussed financial stressors impacting her life. She noted that if it does not rain she will go again tomorrow for a meal at the shelter. [name] shared that she has not heard for the social work intern at the [employer] regarding helping her find resources. She explained that she has ben waiting for the IRS to send her tax statement but is not sure who to call to ask for help. [name] stated that she needs help with toiletries, and with filing for SSI. She shared that [place] [facility] use to help her apply for charity care but now she has no-one to help her. [name] share she is not sure if she will see her daughter for Thanksgiving because her daughter has not called her. She explained that last year she got to see her and her grandchildren. [name] stated she is sleeping better and feels her appetite is good this week. The patient denied any suicidal or homicidal thoughts.  Duration of problem: Years; Severity of problem: moderate  Objective: Mood: Euthymic and Affect: Appropriate Risk of harm to self or  others: No plan to harm self or others  Life Context: Family and Social: see above School/Work: see above Self-Care: see above Life Changes: see above  Patient and/or Family's Strengths/Protective Factors: Concrete supports in place (healthy food, safe environments, etc.)  Goals Addressed: Patient will:  Reduce symptoms of: agitation, anxiety, depression, and stress   Increase knowledge and/or ability of: coping skills, healthy habits, self-management skills, and stress reduction   Demonstrate ability to: Increase healthy adjustment to current life circumstances and Increase adequate support systems for patient/family  Progress towards Goals: Ongoing  Interventions: Interventions utilized:  CBT Cognitive Behavioral Therapy was utilized by the clinician during today's follow up session. Clinician met with patient to identify needs related to stressors and functioning, and assess and monitor for signs and symptoms of anxiety and depression, and assess safety. The clinician processed with the patient how they have been doing since the last follow-up session. Clinician provided a safe space for the patient to ventilate their frustrations. Clinician congratulated the patient on getting out of her home, exercising, and socializing with others. Clinician encouraged the patient to continue practicing self care and to use her coping skills to deal with her current life circumstances. Clinician suggested the patient reach out to her daughter instead of waiting for her daughter to call her. Clinician offered to connect the patient to resources and stated she will call her tomorrow with a time and date to go to [facility] which is a 12 minute walk from  the patient's home for case management and assistance filling for disability. Session ended with scheduling. Standardized Assessments completed: GAD-7 and PHQ 9 PHQ-9=  12 GAD-7 = 10  Assessment: Patient currently experiencing see above.   Patient  may benefit from see above.  Plan: Follow up with behavioral health clinician on : 10/07/2021 at 3:00 PM  Behavioral recommendations:  Referral(s): [place] (In Clinic) "From scale of 1-10, how likely are you to follow plan?":   Lesli Albee, LCSWA

## 2021-09-24 ENCOUNTER — Telehealth: Payer: Self-pay | Admitting: Licensed Clinical Social Worker

## 2021-09-24 NOTE — Telephone Encounter (Signed)
Has not yet called [employer] or [facility] but is working on it. Also requested that Rhett Bannister calls her regarding her appointment with [facility] for assistance filling out Disability Application.

## 2021-10-07 ENCOUNTER — Ambulatory Visit: Payer: Self-pay | Admitting: Licensed Clinical Social Worker

## 2021-10-13 ENCOUNTER — Other Ambulatory Visit: Payer: Self-pay | Admitting: Internal Medicine

## 2021-10-13 ENCOUNTER — Other Ambulatory Visit: Payer: Self-pay

## 2021-10-13 MED FILL — Glucose Blood Test Strip: 25 days supply | Qty: 100 | Fill #6 | Status: AC

## 2021-10-14 ENCOUNTER — Other Ambulatory Visit: Payer: Self-pay | Admitting: Gerontology

## 2021-10-14 ENCOUNTER — Other Ambulatory Visit: Payer: Self-pay

## 2021-10-14 ENCOUNTER — Telehealth: Payer: Self-pay | Admitting: Pharmacist

## 2021-10-14 MED ORDER — ATORVASTATIN CALCIUM 80 MG PO TABS
ORAL_TABLET | Freq: Every day | ORAL | 3 refills | Status: DC
  Filled 2021-10-14: qty 90, 90d supply, fill #0
  Filled 2022-01-12: qty 90, 90d supply, fill #1

## 2021-10-14 NOTE — Telephone Encounter (Signed)
10/14/2021 2:34:57 PM - Cymbalta to pt & dr  -- [name] - Wednesday, October 14, 2021 2:32 PM --Received pharmacy printout from Madelaine Bhat to order Cymbalta 60mg  Take one capsule by mouth every morning # 120 (4 bottles of 30 each). Printed Lilly application--patient has meds in a bag to pick up, put patient portion in bag for patient to sign, putting provider portion in [facility] folder for Eliz. to sign.

## 2021-10-15 ENCOUNTER — Other Ambulatory Visit: Payer: Self-pay

## 2021-10-16 ENCOUNTER — Other Ambulatory Visit: Payer: Self-pay

## 2021-10-20 ENCOUNTER — Telehealth: Payer: Self-pay | Admitting: Pharmacist

## 2021-10-20 ENCOUNTER — Other Ambulatory Visit: Payer: Self-pay

## 2021-10-20 ENCOUNTER — Ambulatory Visit: Payer: Self-pay | Admitting: Licensed Clinical Social Worker

## 2021-10-20 DIAGNOSIS — F4310 Post-traumatic stress disorder, unspecified: Secondary | ICD-10-CM

## 2021-10-20 DIAGNOSIS — F331 Major depressive disorder, recurrent, moderate: Secondary | ICD-10-CM

## 2021-10-20 DIAGNOSIS — F411 Generalized anxiety disorder: Secondary | ICD-10-CM

## 2021-10-20 NOTE — BH Specialist Note (Signed)
Integrated Behavioral Health Follow Up Telephone Visit  MRN: [id number] Name: Courtney Stafford   Total time: 60 minutes  Types of Service: Telephone visit  Interpretor:No. Interpretor Name and Language: N/A  Subjective: Courtney Stafford is a 50 y.o. female accompanied by  herself Patient was referred by [name], NP for Mental Health. Patient reports the following symptoms/concerns: The patient reports that she has been doing the same since her last follow-up appointment. She noted that she has spent a lot of time thinking about her son, and that makes her sad this time of year. She shared that she has not heard from her daughter and will spend Christmas alone. She discussed financial and health related stressors impacting her life. Wilmoth reports that's he is taking her mirtazapine and Cymbalta everyday, but does forget sometimes. She noted that she thinks she misses one or two does a week at most. Marlyn discussed her favorite television shows and games that she plays on her telephone. She noted that she is receiving meals once a week and they are usually frozen. She noted that she did walk to the store once this week, but was scared she was going to fall or not be able to make it back to her home. The patient denied any suicidal or homicidal thoughts.  Duration of problem: Years; Severity of problem: moderate  Objective: Mood: Euthymic and Affect: Appropriate Risk of harm to self or others: No plan to harm self or others  Life Context: Family and Social: see above School/Work: see above Self-Care: see above Life Changes: see above  Patient and/or Family's Strengths/Protective Factors: Concrete supports in place (healthy food, safe environments, etc.) and Sense of purpose  Goals Addressed: Patient will:  Reduce symptoms of: agitation, anxiety, depression, insomnia, and stress   Increase knowledge and/or ability of: coping skills, healthy habits, self-management skills, and  stress reduction   Demonstrate ability to: Increase healthy adjustment to current life circumstances and Increase adequate support systems for patient/family  Progress towards Goals: Ongoing  Interventions: Interventions utilized:  CBT Cognitive Behavioral Therapy was utilized by the clinician during today's follow up session. Clinician met with patient to identify needs related to stressors and functioning, and assess and monitor for signs and symptoms of anxiety and depression, and assess safety. The clinician processed with the patient how they have been doing since the last follow-up session. Clinician measured the patient's anxiety and depression on a numerical scale. Clinician encouraged the patient to take their mediation at the same time everyday exactly as prescribed for it to reach it's full intended effect.   The clinician encouraged the patient to utilize their coping skills to deal with their current life circumstances. The session ended with scheduling.  Standardized Assessments completed: GAD-7 and PHQ 9 PHQ-9 = 12 GAD-7 = 14  Assessment: Patient currently experiencing see above.   Patient may benefit from see above.  Plan: Follow up with behavioral health clinician on : 10/29/2021 at 3:30 PM  Behavioral recommendations:  Referral(s): [place] (In Clinic) "From scale of 1-10, how likely are you to follow plan?":   Lesli Albee, LCSWA

## 2021-10-20 NOTE — Telephone Encounter (Signed)
10/20/2021 2:07:34 PM - Cymbalta/Lilly app faxed  -- [name] - Tuesday, October 20, 2021 2:06 PM --Daylene Posey application for Cymbalta 60mg  Take 1 capsule by mouth every morning.

## 2021-10-29 ENCOUNTER — Ambulatory Visit: Payer: Self-pay | Admitting: Licensed Clinical Social Worker

## 2021-11-10 ENCOUNTER — Other Ambulatory Visit: Payer: Self-pay

## 2021-11-10 MED FILL — Glucose Blood Test Strip: 25 days supply | Qty: 100 | Fill #7 | Status: AC

## 2021-11-12 ENCOUNTER — Other Ambulatory Visit: Payer: Self-pay

## 2021-11-13 ENCOUNTER — Other Ambulatory Visit: Payer: Self-pay

## 2021-11-18 ENCOUNTER — Other Ambulatory Visit: Payer: Self-pay

## 2021-11-18 ENCOUNTER — Encounter: Payer: Self-pay | Admitting: Licensed Clinical Social Worker

## 2021-11-18 ENCOUNTER — Ambulatory Visit: Payer: Self-pay | Admitting: Gerontology

## 2021-11-19 ENCOUNTER — Other Ambulatory Visit: Payer: Self-pay

## 2021-11-20 ENCOUNTER — Other Ambulatory Visit: Payer: Self-pay

## 2021-11-25 ENCOUNTER — Ambulatory Visit: Payer: Self-pay | Admitting: Licensed Clinical Social Worker

## 2021-11-25 ENCOUNTER — Other Ambulatory Visit: Payer: Self-pay

## 2021-11-25 DIAGNOSIS — F331 Major depressive disorder, recurrent, moderate: Secondary | ICD-10-CM

## 2021-11-25 DIAGNOSIS — F411 Generalized anxiety disorder: Secondary | ICD-10-CM

## 2021-11-25 DIAGNOSIS — F4310 Post-traumatic stress disorder, unspecified: Secondary | ICD-10-CM

## 2021-11-25 NOTE — BH Specialist Note (Signed)
Integrated Behavioral Health Follow Up In-Person Visit  MRN: [id number] Name: Courtney Stafford  Total time: 60 minutes  Types of Service: Individual psychotherapy  Interpretor:No. Interpretor Name and Language: N/A  Subjective: Courtney Stafford is a 51 y.o. female accompanied by  herself Patient was referred by [name], NP for mental health. Patient reports the following symptoms/concerns: The patient stated that she has been doing the same since her last follow-up. She discussed that [place] transportation did not call her to schedule her pick up time as usual yesterday. She shared that she has spent a great amount of time concerned about how she is going to get to her appointments, and to et her medications. She stated that her daughter lives out of town and it is hard for her to walk since her stoke. She shared that while she has improved she remains slow and at times unsteady on her feet. Makaia discussed financial and familial stressors impacting her life. She explained that she is struggling to make decisions and is not sure if she will agree to babysit for her grandchildren this summer. The patient noted that she has been sleeping a little bit longer in the daytime and identified that is contributing to her difficulty falling asleep at night. The patient stated that she is looking forward to dryer weather and being able to sit on her porch for fresh air. The patient denied any suicidal or homicidal thoughts.  Duration of problem: Years ; Severity of problem: moderate  Objective: Mood: Euthymic and Affect: Blunt Risk of harm to self or others: No plan to harm self or others  Life Context: Family and Social: see above School/Work: see above Self-Care: see above see above Life Changes: see above  Patient and/or Family's Strengths/Protective Factors: Concrete supports in place (healthy food, safe environments, etc.)  Goals Addressed: Patient will:  Reduce symptoms of:  agitation, anxiety, depression, insomnia, and stress   Increase knowledge and/or ability of: coping skills, healthy habits, self-management skills, and stress reduction   Demonstrate ability to: Increase healthy adjustment to current life circumstances and Increase adequate support systems for patient/family  Progress towards Goals: Ongoing  Interventions: Interventions utilized:  CBT Cognitive Behavioral Therapy was utilized by the clinician during today's follow up session. Clinician met with patient to identify needs related to stressors and functioning, and assess and monitor for signs and symptoms of anxiety and depression, and assess safety. The clinician processed with the patient how they have been doing since the last follow-up session. Clinician answered the patient's questions regarding charity care application process and transportation services.  Clinician intervened with positive regard and optimism to validate client's emotions, and supported client in exploring ways to access community services and increase the patient's independence. Clinician provided the patient with [place] public transit brochure and the number for [employer]. Clinician measured the patient's anxiety and depression on a numerical scale. Clinician reviewed sleep hygiene with the patient and encouraged her to limit her daytime napping and eliminate any stimulants in her diet such as caffeine. The session ended with scheduling.  Standardized Assessments completed: GAD-7 and PHQ 9 PHQ-9 =  12 GAD-7 =  12  Assessment: Patient currently experiencing see above.   Patient may benefit from see above.  Plan: Follow up with behavioral health clinician on : 12/02/2021 at 4:00 PM  Behavioral recommendations:  Referral(s): [place] (In Clinic) "From scale of 1-10, how likely are you to follow plan?":   Courtney Stafford  Courtney Stafford, LCSWA

## 2021-12-02 ENCOUNTER — Ambulatory Visit: Payer: Self-pay | Admitting: Gerontology

## 2021-12-02 ENCOUNTER — Other Ambulatory Visit: Payer: Self-pay

## 2021-12-02 ENCOUNTER — Ambulatory Visit: Payer: Self-pay | Admitting: Licensed Clinical Social Worker

## 2021-12-02 DIAGNOSIS — F411 Generalized anxiety disorder: Secondary | ICD-10-CM

## 2021-12-02 DIAGNOSIS — F331 Major depressive disorder, recurrent, moderate: Secondary | ICD-10-CM

## 2021-12-02 DIAGNOSIS — F4310 Post-traumatic stress disorder, unspecified: Secondary | ICD-10-CM

## 2021-12-02 NOTE — BH Specialist Note (Signed)
Integrated Behavioral Health via Telemedicine Visit  12/02/2021 Courtney Stafford [id number]   Total time: 48  Referring Provider: [name], NP  Patient/Family location: The patients home address [facility] Provider location: [facility] [place] All persons participating in visit: Courtney Stafford. Courtney Stafford and Courtney Stafford, MSW, LCSW-A Types of Service: Telephone visit  I connected with Courtney Stafford  via Telephone or Video Enabled Telemedicine Application  (Video is Caregility application) and verified that I am speaking with the correct person using two identifiers. Discussed confidentiality: Yes   I discussed the limitations of telemedicine and the availability of in person appointments.  Discussed there is a possibility of technology failure and discussed alternative modes of communication if that failure occurs.  Patient and/or legal guardian expressed understanding and consented to Telemedicine visit: Yes   Presenting Concerns: Patient and/or family reports the following symptoms/concerns: The patient reports that she has been doing the same since her last follow-up appointment. She explained that she has been worrying about how she is going to get to [facility] for her appointments since [facility] will be ending their transportation services in February. She shared that is also concerned that she will not be able to get her medications from [facility] due to not having access to transportation. Courtney Stafford noted that she is not able to walk well and the closest [place] bus stop to her home is located in down town [place]. She explained that prior to having a stroke last year she could walk to the bus stop in 15-20 minutes. However, the patient stated that since her mobility has worsened she walks slower and estimates it would take her 30 minutes to walk to the bus stop downtown and she would no longer feel safe crossing a busy four lane road to get there.  Courtney Stafford discussed health and financial stressors impacting her life. The patient denied any suicidal or homicidal thoughts.  Duration of problem: Years; Severity of problem: moderate  Patient and/or Family's Strengths/Protective Factors: Concrete supports in place (healthy food, safe environments, etc.)  Goals Addressed: Patient will:  Reduce symptoms of: agitation, anxiety, depression, insomnia, and stress   Increase knowledge and/or ability of: coping skills, healthy habits, self-management skills, and stress reduction   Demonstrate ability to: Increase healthy adjustment to current life circumstances and Increase adequate support systems for patient/family  Progress towards Goals: Ongoing  Interventions: Interventions utilized:  CBT Cognitive Behavioral Therapy was utilized by the clinician during today's follow up session. Clinician met with patient to identify needs related to stressors and functioning, and assess and monitor for signs and symptoms of anxiety and depression, and assess safety. The clinician processed with the patient how they have been doing since the last follow-up session. Clinician measured the patients anxiety and depression on a numerical scale. The patient was assisted in clarifying her feelings of anxiety as they relate to unresolved life conflict. Clinician intervened with positive regard and optimism to validate client's emotions, and supported client in exploring ways to increase her social interactions and access to community resources to address the patients [facility]. Session ended with scheduling.  Standardized Assessments completed: GAD-7 and PHQ 9 GAD-7 =  15 PHQ-9 =  15    Assessment: Patient currently experiencing see above.   Patient may benefit from see above.  Plan: Follow up with behavioral health clinician on : 12/09/2021 at 4:00 PM  Behavioral recommendations:  Referral(s): Integrated [employer] (In Clinic)  I discussed the  assessment and treatment plan with the patient and/or parent/guardian. They were provided an opportunity to ask questions and all were answered. They agreed with the plan and demonstrated an understanding of the instructions.   They were advised to call back or seek an in-person evaluation if the symptoms worsen or if the condition fails to improve as anticipated.  Courtney Stafford, LCSWA

## 2021-12-09 ENCOUNTER — Ambulatory Visit: Payer: Self-pay | Admitting: Licensed Clinical Social Worker

## 2021-12-09 ENCOUNTER — Other Ambulatory Visit: Payer: Self-pay

## 2021-12-09 ENCOUNTER — Other Ambulatory Visit: Payer: Self-pay | Admitting: Gerontology

## 2021-12-09 DIAGNOSIS — G2581 Restless legs syndrome: Secondary | ICD-10-CM

## 2021-12-09 DIAGNOSIS — F411 Generalized anxiety disorder: Secondary | ICD-10-CM

## 2021-12-09 DIAGNOSIS — F331 Major depressive disorder, recurrent, moderate: Secondary | ICD-10-CM

## 2021-12-09 DIAGNOSIS — F4310 Post-traumatic stress disorder, unspecified: Secondary | ICD-10-CM

## 2021-12-09 MED FILL — Glucose Blood Test Strip: 25 days supply | Qty: 100 | Fill #8 | Status: AC

## 2021-12-09 NOTE — BH Specialist Note (Signed)
Integrated Behavioral Health via Telemedicine Visit  12/09/2021 Courtney Stafford [id number]   Total time: 26  Referring Provider: [name], NP  Patient/Family location: The patient's home address [facility] Provider location: [facility] [place] All persons participating in visit: Courtney Stafford, Courtney Stafford, MSW, LCSW-A Types of Service: Telephone visit  I connected with Courtney Stafford via  Telephone or Video Enabled Telemedicine Application  (Video is Caregility application) and verified that I am speaking with the correct person using two identifiers. Discussed confidentiality: Yes   I discussed the limitations of telemedicine and the availability of in person appointments.  Discussed there is a possibility of technology failure and discussed alternative modes of communication if that failure occurs.  Patient and/or legal guardian expressed understanding and consented to Telemedicine visit: Yes   Presenting Concerns: Patient and/or family reports the following symptoms/concerns: The patient reported that she has been doing well since her last follow-up appointment. She shared that she has been sick on her stomach today and her toilet is not working further making this a difficult day for her. The patient shared she contacted her landlord, but he is unable to fix the plumbing today. Courtney Stafford explained that she has been doing okay and has good days and bad days . She shared that on her bad days she crys often and feels very down;  usually this is brought on by memories of her son and missing him. Courtney Stafford explained that she feels depressed about half the days in a week. She discussed feeling upset over [facility] ending their transportation services and is concerned that she will not be able to go to her doctor appointments or to the [facility] anymore. Courtney Stafford again shared how her mobility has been impacted since her stroke. The patient noted she has not reached out  to her daughter and is not sure if she will be watching her grandchildren this summer. She reports she did receive her tax transcript information in the mail this week and requested help filing for [facility] care. Courtney Stafford denied any suicidal or homicidal thoughts. Duration of problem: Years; Severity of problem: moderate  Patient and/or Family's Strengths/Protective Factors: Concrete supports in place (healthy food, safe environments, etc.)  Goals Addressed: Patient will:  Reduce symptoms of: agitation, anxiety, depression, insomnia, and stress   Increase knowledge and/or ability of: coping skills, healthy habits, self-management skills, and stress reduction   Demonstrate ability to: Increase healthy adjustment to current life circumstances and Increase adequate support systems for patient/family  Progress towards Goals: Ongoing  Interventions: Interventions utilized:  CBT Cognitive Behavioral Therapy was utilized by the clinician during today's follow up session. Clinician met with patient to identify needs related to stressors and functioning, and assess and monitor for signs and symptoms of anxiety and depression, and assess safety. The clinician processed with the patient how they have been doing since the last follow-up session. Clinician measured the patient's anxiety and depression on a numerical scale. Clinician encouraged the patient to reach out to [facility] and inquire if an appointment is needed to talk to a case manager about assistance filing for [place]. Clinician explained the process of filing for [facility] care and offered to assist the patient with the paperwork on 1/31 when she comes in for her appointment with her primary care provider. Clinician encouraged the client to reach out to [facility] and arrange ride for her medical appointment next week. Clinician explored with the patient her  implementation of previously  taught coping skills. Clinician encouraged the client to focus on the positives in her life verses the negative. The session ended with scheduling. Standardized Assessments completed: GAD-7 and PHQ 9 GAD-7= 15 PHQ-9 = 16 Assessment: Patient currently experiencing see above.   Patient may benefit from see above.  Plan: Follow up with behavioral health clinician on : 12/22/2021 at 4:00 PM Behavioral recommendations:  Referral(s): [place] (In Clinic)  I discussed the assessment and treatment plan with the patient and/or parent/guardian. They were provided an opportunity to ask questions and all were answered. They agreed with the plan and demonstrated an understanding of the instructions.   They were advised to call back or seek an in-person evaluation if the symptoms worsen or if the condition fails to improve as anticipated.  Courtney Stafford, LCSWA

## 2021-12-10 ENCOUNTER — Other Ambulatory Visit: Payer: Self-pay

## 2021-12-14 ENCOUNTER — Other Ambulatory Visit: Payer: Self-pay

## 2021-12-14 ENCOUNTER — Other Ambulatory Visit: Payer: Self-pay | Admitting: Gerontology

## 2021-12-14 DIAGNOSIS — F411 Generalized anxiety disorder: Secondary | ICD-10-CM

## 2021-12-14 DIAGNOSIS — G2581 Restless legs syndrome: Secondary | ICD-10-CM

## 2021-12-15 ENCOUNTER — Other Ambulatory Visit: Payer: Self-pay

## 2021-12-15 ENCOUNTER — Encounter: Payer: Self-pay | Admitting: Gerontology

## 2021-12-15 ENCOUNTER — Ambulatory Visit: Payer: Self-pay | Admitting: Gerontology

## 2021-12-15 VITALS — BP 176/121 | HR 85 | Temp 97.4°F | Resp 16 | Ht 60.0 in | Wt 200.6 lb

## 2021-12-15 DIAGNOSIS — I10 Essential (primary) hypertension: Secondary | ICD-10-CM

## 2021-12-15 DIAGNOSIS — E109 Type 1 diabetes mellitus without complications: Secondary | ICD-10-CM

## 2021-12-15 LAB — GLUCOSE, POCT (MANUAL RESULT ENTRY): POC Glucose: 171 mg/dl — AB (ref 70–99)

## 2021-12-15 LAB — POCT GLYCOSYLATED HEMOGLOBIN (HGB A1C): Hemoglobin A1C: 7.4 % — AB (ref 4.0–5.6)

## 2021-12-15 MED ORDER — AMLODIPINE BESYLATE 5 MG PO TABS
ORAL_TABLET | Freq: Every day | ORAL | 1 refills | Status: DC
  Filled 2021-12-15: qty 90, fill #0
  Filled 2022-01-12 – 2022-04-06 (×2): qty 180, 90d supply, fill #0

## 2021-12-15 MED ORDER — ASPIRIN 81 MG PO TBEC
81.0000 mg | DELAYED_RELEASE_TABLET | Freq: Every day | ORAL | 4 refills | Status: DC
  Filled 2022-01-12: qty 90, 90d supply, fill #0
  Filled 2022-04-06: qty 30, 30d supply, fill #0
  Filled 2022-08-06: qty 90, 90d supply, fill #1
  Filled 2022-12-10: qty 90, 90d supply, fill #2

## 2021-12-15 MED ORDER — CARVEDILOL 12.5 MG PO TABS
ORAL_TABLET | Freq: Two times a day (BID) | ORAL | 1 refills | Status: DC
  Filled 2022-01-12 – 2022-04-06 (×2): qty 180, 90d supply, fill #0

## 2021-12-15 MED ORDER — HYDRALAZINE HCL 25 MG PO TABS
ORAL_TABLET | Freq: Two times a day (BID) | ORAL | 1 refills | Status: DC
  Filled 2022-01-12: qty 120, 60d supply, fill #0
  Filled 2022-02-23: qty 60, 30d supply, fill #1
  Filled 2022-04-06: qty 60, 30d supply, fill #0

## 2021-12-15 MED FILL — Gabapentin Cap 300 MG: ORAL | 30 days supply | Qty: 210 | Fill #0 | Status: AC

## 2021-12-15 MED FILL — Mirtazapine Tab 15 MG: ORAL | 30 days supply | Qty: 30 | Fill #0 | Status: AC

## 2021-12-15 NOTE — Patient Instructions (Signed)
Carbohydrate Counting for Diabetes Mellitus, Adult °Carbohydrate counting is a method of keeping track of how many carbohydrates you eat. Eating carbohydrates increases the amount of sugar (glucose) in the blood. Counting how many carbohydrates you eat improves how well you manage your blood glucose. This, in turn, helps you manage your diabetes. °Carbohydrates are measured in grams (g) per serving. It is important to know how many carbohydrates (in grams or by serving size) you can have in each meal. This is different for every person. A dietitian can help you make a meal plan and calculate how many carbohydrates you should have at each meal and snack. °What foods contain carbohydrates? °Carbohydrates are found in the following foods: °Grains, such as breads and cereals. °Dried beans and soy products. °Starchy vegetables, such as potatoes, peas, and corn. °Fruit and fruit juices. °Milk and yogurt. °Sweets and snack foods, such as cake, cookies, candy, chips, and soft drinks. °How do I count carbohydrates in foods? °There are two ways to count carbohydrates in food. You can read food labels or learn standard serving sizes of foods. You can use either of these methods or a combination of both. °Using the Nutrition Facts label °The Nutrition Facts list is included on the labels of almost all packaged foods and beverages in the [place]. It includes: °The serving size. °Information about nutrients in each serving, including the grams of carbohydrate per serving. °To use the Nutrition Facts, decide how many servings you will have. Then, multiply the number of servings by the number of carbohydrates per serving. The resulting number is the total grams of carbohydrates that you will be having. °Learning the standard serving sizes of foods °When you eat carbohydrate foods that are not packaged or do not include Nutrition Facts on the label, you need to measure the servings in order to count the grams of  carbohydrates. °Measure the foods that you will eat with a food scale or measuring cup, if needed. °Decide how many standard-size servings you will eat. °Multiply the number of servings by 15. For foods that contain carbohydrates, one serving equals 15 g of carbohydrates. °For example, if you eat 2 cups or 10 oz (300 g) of strawberries, you will have eaten 2 servings and 30 g of carbohydrates (2 servings x 15 g = 30 g). °For foods that have more than one food mixed, such as soups and casseroles, you must count the carbohydrates in each food that is included. °The following list contains standard serving sizes of common carbohydrate-rich foods. Each of these servings has about 15 g of carbohydrates: °1 slice of bread. °1 six-inch (15 cm) tortilla. °? cup or 2 oz (53 g) cooked rice or pasta. °½ cup or 3 oz (85 g) cooked or canned, drained and rinsed beans or lentils. °½ cup or 3 oz (85 g) starchy vegetable, such as peas, corn, or squash. °½ cup or 4 oz (120 g) hot cereal. °½ cup or 3 oz (85 g) boiled or mashed potatoes, or ¼ or 3 oz (85 g) of a large baked potato. °½ cup or 4 fl oz (118 mL) fruit juice. °1 cup or 8 fl oz (237 mL) milk. °1 small or 4 oz (106 g) apple. °½ or 2 oz (63 g) of a medium banana. °1 cup or 5 oz (150 g) strawberries. °3 cups or 1 oz (28.3 g) popped popcorn. °What is an example of carbohydrate counting? °To calculate the grams of carbohydrates in this sample meal, follow the steps   shown below. °Sample meal °3 oz (85 g) chicken breast. °? cup or 4 oz (106 g) brown rice. °½ cup or 3 oz (85 g) corn. °1 cup or 8 fl oz (237 mL) milk. °1 cup or 5 oz (150 g) strawberries with sugar-free whipped topping. °Carbohydrate calculation °Identify the foods that contain carbohydrates: °Rice. °Corn. °Milk. °Strawberries. °Calculate how many servings you have of each food: °2 servings rice. °1 serving corn. °1 serving milk. °1 serving strawberries. °Multiply each number of servings by 15 g: °2 servings rice x 15  g = 30 g. °1 serving corn x 15 g = 15 g. °1 serving milk x 15 g = 15 g. °1 serving strawberries x 15 g = 15 g. °Add together all of the amounts to find the total grams of carbohydrates eaten: °30 g + 15 g + 15 g + 15 g = 75 g of carbohydrates total. °What are tips for following this plan? °Shopping °Develop a meal plan and then make a shopping list. °Buy fresh and frozen vegetables, fresh and frozen fruit, dairy, eggs, beans, lentils, and whole grains. °Look at food labels. Choose foods that have more fiber and less sugar. °Avoid processed foods and foods with added sugars. °Meal planning °Aim to have the same number of grams of carbohydrates at each meal and for each snack time. °Plan to have regular, balanced meals and snacks. °Where to find more information °American Diabetes Association: diabetes.org °Centers for Disease Control and Prevention: cdc.gov °Academy of Nutrition and Dietetics: eatright.org °Association of Diabetes Care & Education Specialists: diabeteseducator.org °Summary °Carbohydrate counting is a method of keeping track of how many carbohydrates you eat. °Eating carbohydrates increases the amount of sugar (glucose) in your blood. °Counting how many carbohydrates you eat improves how well you manage your blood glucose. This helps you manage your diabetes. °A dietitian can help you make a meal plan and calculate how many carbohydrates you should have at each meal and snack. °This information is not intended to replace advice given to you by your health care provider. Make sure you discuss any questions you have with your health care provider. °Document Revised: 06/04/2020 Document Reviewed: 06/04/2020 °Elsevier Patient Education © 2022 Elsevier Inc. °DASH Eating Plan °DASH stands for Dietary Approaches to Stop Hypertension. The DASH eating plan is a healthy eating plan that has been shown to: °Reduce high blood pressure (hypertension). °Reduce your risk for type 2 diabetes, heart disease, and  stroke. °Help with weight loss. °What are tips for following this plan? °Reading food labels °Check food labels for the amount of salt (sodium) per serving. Choose foods with less than 5 percent of the Daily Value of sodium. Generally, foods with less than 300 milligrams (mg) of sodium per serving fit into this eating plan. °To find whole grains, look for the word "whole" as the first word in the ingredient list. °Shopping °Buy products labeled as "low-sodium" or "no salt added." °Buy fresh foods. Avoid canned foods and pre-made or frozen meals. °Cooking °Avoid adding salt when cooking. Use salt-free seasonings or herbs instead of table salt or sea salt. Check with your health care provider or pharmacist before using salt substitutes. °Do not fry foods. Cook foods using healthy methods such as baking, boiling, grilling, roasting, and broiling instead. °Cook with heart-healthy oils, such as olive, canola, avocado, soybean, or sunflower oil. °Meal planning ° °Eat a balanced diet that includes: °4 or more servings of fruits and 4 or more servings of vegetables each day.   Try to fill one-half of your plate with fruits and vegetables. °6-8 servings of whole grains each day. °Less than 6 oz (170 g) of lean meat, poultry, or fish each day. A 3-oz (85-g) serving of meat is about the same size as a deck of cards. One egg equals 1 oz (28 g). °2-3 servings of low-fat dairy each day. One serving is 1 cup (237 mL). °1 serving of nuts, seeds, or beans 5 times each week. °2-3 servings of heart-healthy fats. Healthy fats called omega-3 fatty acids are found in foods such as walnuts, flaxseeds, fortified milks, and eggs. These fats are also found in cold-water fish, such as sardines, salmon, and mackerel. °Limit how much you eat of: °Canned or prepackaged foods. °Food that is high in trans fat, such as some fried foods. °Food that is high in saturated fat, such as fatty meat. °Desserts and other sweets, sugary drinks, and other foods  with added sugar. °Full-fat dairy products. °Do not salt foods before eating. °Do not eat more than 4 egg yolks a week. °Try to eat at least 2 vegetarian meals a week. °Eat more home-cooked food and less restaurant, buffet, and fast food. °Lifestyle °When eating at a restaurant, ask that your food be prepared with less salt or no salt, if possible. °If you drink alcohol: °Limit how much you use to: °0-1 drink a day for women who are not pregnant. °0-2 drinks a day for men. °Be aware of how much alcohol is in your drink. In the [place] one drink equals one 12 oz bottle of beer (355 mL), one 5 oz glass of wine (148 mL), or one 1½ oz glass of hard liquor (44 mL). °General information °Avoid eating more than 2,300 mg of salt a day. If you have hypertension, you may need to reduce your sodium intake to 1,500 mg a day. °Work with your health care provider to maintain a healthy body weight or to lose weight. Ask what an ideal weight is for you. °Get at least 30 minutes of exercise that causes your heart to beat faster (aerobic exercise) most days of the week. Activities may include walking, swimming, or biking. °Work with your health care provider or dietitian to adjust your eating plan to your individual calorie needs. °What foods should I eat? °Fruits °All fresh, dried, or frozen fruit. Canned fruit in natural juice (without added sugar). °Vegetables °Fresh or frozen vegetables (raw, steamed, roasted, or grilled). Low-sodium or reduced-sodium tomato and vegetable juice. Low-sodium or reduced-sodium tomato sauce and tomato paste. Low-sodium or reduced-sodium canned vegetables. °Grains °Whole-grain or whole-wheat bread. Whole-grain or whole-wheat pasta. Brown rice. Oatmeal. Quinoa. Bulgur. Whole-grain and low-sodium cereals. Pita bread. Low-fat, low-sodium crackers. Whole-wheat flour tortillas. °Meats and other proteins °Skinless chicken or turkey. Ground chicken or turkey. Pork with fat trimmed off. Fish and seafood. Egg  whites. Dried beans, peas, or lentils. Unsalted nuts, nut butters, and seeds. Unsalted canned beans. Lean cuts of beef with fat trimmed off. Low-sodium, lean precooked or cured meat, such as sausages or meat loaves. °Dairy °Low-fat (1%) or fat-free (skim) milk. Reduced-fat, low-fat, or fat-free cheeses. Nonfat, low-sodium ricotta or cottage cheese. Low-fat or nonfat yogurt. Low-fat, low-sodium cheese. °Fats and oils °Soft margarine without trans fats. Vegetable oil. Reduced-fat, low-fat, or light mayonnaise and salad dressings (reduced-sodium). Canola, safflower, olive, avocado, soybean, and sunflower oils. Avocado. °Seasonings and condiments °Herbs. Spices. Seasoning mixes without salt. °Other foods °Unsalted popcorn and pretzels. Fat-free sweets. °The items listed above may not be   a complete list of foods and beverages you can eat. Contact a dietitian for more information. °What foods should I avoid? °Fruits °Canned fruit in a light or heavy syrup. Fried fruit. Fruit in cream or butter sauce. °Vegetables °Creamed or fried vegetables. Vegetables in a cheese sauce. Regular canned vegetables (not low-sodium or reduced-sodium). Regular canned tomato sauce and paste (not low-sodium or reduced-sodium). Regular tomato and vegetable juice (not low-sodium or reduced-sodium). Pickles. Olives. °Grains °Baked goods made with fat, such as croissants, muffins, or some breads. Dry pasta or rice meal packs. °Meats and other proteins °Fatty cuts of meat. Ribs. Fried meat. Bacon. Bologna, salami, and other precooked or cured meats, such as sausages or meat loaves. Fat from the back of a pig (fatback). Bratwurst. Salted nuts and seeds. Canned beans with added salt. Canned or smoked fish. Whole eggs or egg yolks. Chicken or turkey with skin. °Dairy °Whole or 2% milk, cream, and half-and-half. Whole or full-fat cream cheese. Whole-fat or sweetened yogurt. Full-fat cheese. Nondairy creamers. Whipped toppings. Processed cheese and  cheese spreads. °Fats and oils °Butter. Stick margarine. Lard. Shortening. Ghee. Bacon fat. Tropical oils, such as coconut, palm kernel, or palm oil. °Seasonings and condiments °Onion salt, garlic salt, seasoned salt, table salt, and sea salt. Worcestershire sauce. Tartar sauce. Barbecue sauce. Teriyaki sauce. Soy sauce, including reduced-sodium. Steak sauce. Canned and packaged gravies. Fish sauce. Oyster sauce. Cocktail sauce. Store-bought horseradish. Ketchup. Mustard. Meat flavorings and tenderizers. Bouillon cubes. Hot sauces. Pre-made or packaged marinades. Pre-made or packaged taco seasonings. Relishes. Regular salad dressings. °Other foods °Salted popcorn and pretzels. °The items listed above may not be a complete list of foods and beverages you should avoid. Contact a dietitian for more information. °Where to find more information °National Heart, Lung, and Blood Institute: www.nhlbi.nih.gov °American Heart Association: www.heart.org °Academy of Nutrition and Dietetics: www.eatright.org °National Kidney Foundation: www.kidney.org °Summary °The DASH eating plan is a healthy eating plan that has been shown to reduce high blood pressure (hypertension). It may also reduce your risk for type 2 diabetes, heart disease, and stroke. °When on the DASH eating plan, aim to eat more fresh fruits and vegetables, whole grains, lean proteins, low-fat dairy, and heart-healthy fats. °With the DASH eating plan, you should limit salt (sodium) intake to 2,300 mg a day. If you have hypertension, you may need to reduce your sodium intake to 1,500 mg a day. °Work with your health care provider or dietitian to adjust your eating plan to your individual calorie needs. °This information is not intended to replace advice given to you by your health care provider. Make sure you discuss any questions you have with your health care provider. °Document Revised: 10/05/2019 Document Reviewed: 10/05/2019 °Elsevier Patient Education © 2022  Elsevier Inc. ° °

## 2021-12-15 NOTE — Progress Notes (Signed)
Established Patient Office Visit  Subjective:  Patient ID: Courtney Stafford, female    DOB: 12-Mar-1971  Age: 51 y.o. MRN: [id number]  CC:  Chief Complaint  Patient presents with   Follow-up   Hypertension   Diabetes    HPI Courtney Stafford  is a 51 y/o female who has history of T1DM, Hypertension, Stroke, presents for routine follow up and lab review. Her HgbA1c done during visit decreased from 8% to 7.4%. She checks her blood glucose 3-4 times daily. Per her log, her fasting readings are usually between 80-145 mg/dl, mid afternoon to early evening is usually between 136 to 250 mg/dl and 10 pm is between 134 to 270 mg/dl. She denies hypoglycemic symptoms, but endorses polyuria and polydipsia. Her blood pressure was elevated, and she states that she has not taking her blood pressure medicine. Per Medication management records, her medications were last dispensed on 10/16/21 and she would  have being out since 11/16/21. She denies chest pain, headache, dizziness, and vision changes. Overall, she states that she is doing well and offers no further complaint.   Past Medical History:  Diagnosis Date   Diabetes mellitus without complication ([place])    Hypertension    Stroke ([place]) 07/31/2020    Past Surgical History:  Procedure Laterality Date   CARPAL TUNNEL RELEASE  6 years ago   both hands    Family History  Problem Relation Age of Onset   Other Mother        unknown medical history   Cancer Father    Diabetes Maternal Grandmother    Breast cancer Neg Hx     Social History   Socioeconomic History   Marital status: Single    Spouse name: Not on file   Number of children: 2   Years of education: Not on file   Highest education level: High school graduate  Occupational History   Occupation: unemployed  Tobacco Use   Smoking status: Some Days    Packs/day: 0.01    Types: Cigarettes   Smokeless tobacco: Never   Tobacco comments:    occasional  Vaping Use   Vaping Use:  Never used  Substance and Sexual Activity   Alcohol use: No   Drug use: No   Sexual activity: Not on file  Other Topics Concern   Not on file  Social History Narrative   Completed biopsychosocial assessment, social determinants screening, administered PHQ 9, and GAD 7.       Social determinants screening completed 01/31/2020. HS She receives bus passes [facility] for transportation. She receives food stamps 192 per month and utilizes a Building surveyor when needed. [employer] pays for housing and utilities.  She follows up with therapist at clinic for stress, anxiety, and depression. HS      Support system is small.   Social Determinants of Health   Financial Resource Strain: Medium Risk   Difficulty of Paying Living Expenses: Somewhat hard  Food Insecurity: No Food Insecurity   Worried About Charity fundraiser in the Last Year: Never true   Arboriculturist in the Last Year: Never true  Transportation Needs: Unmet Transportation Needs   Lack of Transportation (Medical): Yes   Lack of Transportation (Non-Medical): Yes  Physical Activity: Inactive   Days of Exercise per Week: 0 days   Minutes of Exercise per Session: 0 min  Stress: Not on file  Social Connections: Socially Isolated   Frequency of Communication with  Friends and Family: Once a week   Frequency of Social Gatherings with Friends and Family: Once a week   Attends Religious Services: Never   Marine scientist or Organizations: Yes   Attends Archivist Meetings: Never   Marital Status: Separated  Intimate Partner Violence: Not At Risk   Fear of Current or Ex-Partner: No   Emotionally Abused: No   Physically Abused: No   Sexually Abused: No    Outpatient Medications Prior to Visit  Medication Sig Dispense Refill   aspirin 81 MG EC tablet Take 1 tablet (81 mg total) by mouth once daily. 30 tablet 11   atorvastatin (LIPITOR) 80 MG tablet TAKE ONE TABLET BY MOUTH EVERY DAY 90 tablet 3    Blood Glucose Monitoring Suppl Supplies MISC 1 each by Does not apply route 5 (five) times daily. (Patient taking differently: 1 each by Does not apply route. Check 3 to 4 times daily) 500 each 4   carvedilol (COREG) 12.5 MG tablet TAKE ONE TABLET BY MOUTH 2 TIMES A DAY WITH A MEAL 60 tablet 3   CYMBALTA 60 MG capsule TAKE ONE CAPSULE BY MOUTH EVERY MORNING 30 capsule 2   glucose blood test strip USE TO TEST BLOOD GLUCOSE UP TO 4 TIMES PER DAY, IN THE MORNING AND AFTER MEALS 100 strip 11   hydrALAZINE (APRESOLINE) 25 MG tablet TAKE ONE TABLET BY MOUTH 2 TIMES A DAY 60 tablet 2   hydrochlorothiazide (HYDRODIURIL) 25 MG tablet TAKE ONE TABLET BY MOUTH EVERY DAY 90 tablet 1   insulin aspart (NOVOLOG FLEXPEN) 100 UNIT/ML FlexPen Inject 5 Units into the skin 3 times daily with meals. Add 1 unit if 200-250; 2 units 251-300; 3 units 301-350; 4 units 351-400; If over 400 take 10 units total and recheck in 2 hours and notify clinic or go to the ED. 75 mL 3   insulin glargine (LANTUS) 100 UNIT/ML Solostar Pen Inject 30 Units into the skin once daily at bedtime. 30 mL 4   Insulin Pen Needle (BD PEN NEEDLE NANO U/F) 32G X 4 MM MISC 5 times daily (Patient taking differently: 3 to 4 times daily) 200 each 12   Insulin Pen Needle (NOVOFINE PEN NEEDLE) 32G X 6 MM MISC USE AS DIRECTED. 400 each PRN   Insulin Syringe-Needle U-100 31G X 15/64" 1 ML MISC AS DIRECTED 90 each 11   lisinopril (ZESTRIL) 40 MG tablet TAKE ONE TABLET BY MOUTH EVERY DAY 90 tablet 1   meloxicam (MOBIC) 7.5 MG tablet Take 1 tablet (7.5 mg total) by mouth once daily as needed for pain. 30 tablet 1   Rightest GL300 Lancets MISC USE AS DIRECTED 100 each 3   amLODipine (NORVASC) 10 MG tablet TAKE ONE TABLET BY MOUTH EVERY DAY 90 tablet 1   gabapentin (NEURONTIN) 300 MG capsule TAKE 2 CAPSULES (600MG) BY MOUTH 2 TIMES A DAY AND 3 CAPSULES (900MG) AT NIGHT 210 capsule 1   mirtazapine (REMERON) 15 MG tablet Take 1 tablet (15 mg total) by mouth once  daily at bedtime. 30 tablet 0   No facility-administered medications prior to visit.    Allergies  Allergen Reactions   Phenergan [Promethazine] Hives    ROS Review of Systems  Constitutional: Negative.   Eyes: Negative.   Respiratory: Negative.    Cardiovascular: Negative.   Endocrine: Positive for polydipsia and polyuria.  Skin: Negative.   Neurological: Negative.   Psychiatric/Behavioral: Negative.       Objective:  Physical Exam HENT:     Head: Normocephalic and atraumatic.     Mouth/Throat:     Mouth: Mucous membranes are moist.  Eyes:     Extraocular Movements: Extraocular movements intact.     Conjunctiva/sclera: Conjunctivae normal.     Pupils: Pupils are equal, round, and reactive to light.  Cardiovascular:     Rate and Rhythm: Normal rate and regular rhythm.     Pulses: Normal pulses.     Heart sounds: Normal heart sounds.  Pulmonary:     Effort: Pulmonary effort is normal.     Breath sounds: Normal breath sounds.  Skin:    General: Skin is warm.  Neurological:     General: No focal deficit present.     Mental Status: She is alert and oriented to person, place, and time. Mental status is at baseline.  Psychiatric:        Mood and Affect: Mood normal.        Behavior: Behavior normal.        Thought Content: Thought content normal.        Judgment: Judgment normal.    BP (!) 176/121 (BP Location: Right Arm, Patient Position: Sitting, Cuff Size: Large)    Pulse 85    Temp (!) 97.4 F (36.3 C) (Oral)    Resp 16    Ht 5' (1.524 m)    Wt 200 lb 9.6 oz (91 kg)    LMP 01/03/2016 (Within Years)    SpO2 95%    BMI 39.18 kg/m  Wt Readings from Last 3 Encounters:  12/15/21 200 lb 9.6 oz (91 kg)  08/19/21 192 lb 8 oz (87.3 kg)  08/19/21 192 lb 8 oz (87.3 kg)   Encouraged weight loss  Health Maintenance Due  Topic Date Due   COVID-19 Vaccine (1) Never done   HIV Screening  Never done   Hepatitis C Screening  Never done   TETANUS/TDAP  Never done    Zoster Vaccines- Shingrix (1 of 2) Never done   COLONOSCOPY (Pts 45-28yr Insurance coverage will need to be confirmed)  Never done   OPHTHALMOLOGY EXAM  03/23/2019   MAMMOGRAM  09/18/2021    There are no preventive care reminders to display for this patient.  Lab Results  Component Value Date   TSH 0.951 07/31/2020   Lab Results  Component Value Date   WBC 9.2 08/02/2020   HGB 16.0 (H) 08/02/2020   HCT 48.5 (H) 08/02/2020   MCV 83.9 08/02/2020   PLT 328 08/02/2020   Lab Results  Component Value Date   NA 142 01/14/2021   K 3.9 01/14/2021   CO2 22 01/14/2021   GLUCOSE 155 (H) 01/14/2021   BUN 10 01/14/2021   CREATININE 0.94 01/14/2021   BILITOT 0.5 01/14/2021   ALKPHOS 133 (H) 01/14/2021   AST 18 01/14/2021   ALT 12 01/14/2021   PROT 7.4 01/14/2021   ALBUMIN 4.5 01/14/2021   CALCIUM 9.6 01/14/2021   ANIONGAP 11 08/02/2020   EGFR 73 01/14/2021   Lab Results  Component Value Date   CHOL 218 (H) 07/31/2020   Lab Results  Component Value Date   HDL 78 07/31/2020   Lab Results  Component Value Date   LDLCALC 109 (H) 07/31/2020   Lab Results  Component Value Date   TRIG 157 (H) 07/31/2020   Lab Results  Component Value Date   CHOLHDL 2.8 07/31/2020   Lab Results  Component Value Date   HGBA1C 7.4 (A)  12/15/2021      Assessment & Plan:      1. Type 1 diabetes mellitus without complication (HCC) - Her HgbA1c was 7.4%, her goal should be less than 6%, though her diabetes is improving. She was advised to continue on current medication, low carb/non concentrated sweet diet and exercise as tolerated.  - POCT HgB A1C - POCT Glucose (CBG)  2. Essential hypertension - Her blood pressure is not under control probably due to medication non compliance. She was encouraged to take medication as prescribed, continue on DASH diet and exercise as tolerated. She was educated on signs and symptoms of stroke and to go to the ED,, she verbalized understanding. -  amLODipine (NORVASC) 10 MG tablet; TAKE ONE TABLET BY MOUTH ONCE EVERY DAY.  Dispense: 90 tablet; Refill: 1 - carvedilol (COREG) 12.5 MG tablet; TAKE ONE TABLET BY MOUTH 2 TIMES A DAY WITH A MEAL  Dispense: 180 tablet; Refill: 1 - hydrALAZINE (APRESOLINE) 25 MG tablet; TAKE ONE TABLET BY MOUTH 2 TIMES A DAY  Dispense: 180 tablet; Refill: 1 - aspirin 81 MG EC tablet; Take 1 tablet (81 mg total) by mouth once daily.  Dispense: 90 tablet; Refill: 4   Follow-up: Return in about 1 month (around 01/12/2022), or if symptoms worsen or fail to improve.    Aneliz Carbary Jerold Coombe, NP

## 2021-12-22 ENCOUNTER — Ambulatory Visit: Payer: Self-pay | Admitting: Licensed Clinical Social Worker

## 2021-12-22 ENCOUNTER — Other Ambulatory Visit: Payer: Self-pay

## 2021-12-22 DIAGNOSIS — F331 Major depressive disorder, recurrent, moderate: Secondary | ICD-10-CM

## 2021-12-22 DIAGNOSIS — F4310 Post-traumatic stress disorder, unspecified: Secondary | ICD-10-CM

## 2021-12-22 DIAGNOSIS — F411 Generalized anxiety disorder: Secondary | ICD-10-CM

## 2021-12-22 NOTE — BH Specialist Note (Signed)
Integrated Behavioral Health via Telemedicine Visit  12/22/2021 Courtney Stafford [id number]  Total time: 51  Referring Provider: [name], NP Patient/Family location: The patient's home address [facility] Provider location: [facility] All persons participating in visit: Harlin Rain and [name], MSW, LCSW-A Types of Service: Telephone visit  I connected with Courtney Stafford Telephone or Video Enabled Telemedicine Application  (Video is Caregility application) and verified that I am speaking with the correct person using two identifiers. Discussed confidentiality: Yes   I discussed the limitations of telemedicine and the availability of in person appointments.  Discussed there is a possibility of technology failure and discussed alternative modes of communication if that failure occurs.  Patient and/or legal guardian expressed understanding and consented to Telemedicine visit: Yes   Presenting Concerns: Patient and/or family reports the following symptoms/concerns: The patient reports that she has been doing better since last week. She shared that she has recovered from the stomach virus she had last week. She noted that her landlord repaired the plumbing issues in her home. She explained that she has been keeping her door open today and the sunlight and cool breeze has helped boost her mood today. She stated that since the holidays have passed she has slowly been feeling better. She discussed financial and familial stressors impacting her life currently. The patient stated that she has spent a lot of time worrying about how she is going to get to her doctor's appointments and to [facility] to pick up her prescriptions now that [facility] is ending their transportation program. Courtney Stafford also shared that she wants to try to walk short distances for exercise, but because her sugar levels have not been "good" she stays inside so she does not get sick away from  home. The patient shared that her diet is mainly processed food that she can heat up in the microwave because she can not stand long and cook. She noted it is difficult to make meals as a single person and she doesn't like to waste food. Courtney Stafford noted that she gets frozen meals delivered to her once a week by her pastor's wife. Courtney Stafford noted that she missed her appointment yesterday with [facility] and she is going to call them to reschedule tomorrow. Courtney Stafford denied any suicidal or homicidal thoughts.  Duration of problem: Years; Severity of problem: moderate  Patient and/or Family's Strengths/Protective Factors: Social connections, Concrete supports in place (healthy food, safe environments, etc.), and Sense of purpose  Goals Addressed: Patient will:  Reduce symptoms of: agitation, anxiety, depression, insomnia, and stress   Increase knowledge and/or ability of: coping skills, healthy habits, self-management skills, and stress reduction   Demonstrate ability to: Increase healthy adjustment to current life circumstances and Increase adequate support systems for patient/family  Progress towards Goals: Ongoing  Interventions: Interventions utilized:  CBT Cognitive Behavioral Therapy was utilized by the clinician during today's follow up session. Clinician met with patient to identify needs related to stressors and functioning, and assess and monitor for signs and symptoms of anxiety and depression, and assess safety. The clinician processed with the patient how they have been doing since the last follow-up session. Clinician measured the patient's anxiety and depression on a numerical scale. Clinician intervened with positive regard and optimism to validate client's emotions, and supported client in exploring ways to access community resources. Clinician encouraged the patient to continue to work on calming techniques (e.g.. paced breathing, deep muscle relaxation, and calming imagery) as a strategy  for responding appropriately to anxiety and the urge to avoid situations or self-isolate when they occur and move towards increasing the patients self regulation. Clinician assisted the patient to identify and examine their fears closely, and she was assisted in identifying the irrational nature of her persistent worry. Clinician encouraged Courtney Stafford to follow through with the referral to [facility] as soon as possible. The session ended with scheduling.   Standardized Assessments completed: GAD-7 and PHQ 9 GAD-7 =  13 PHQ-9 =  12  Assessment: Patient currently experiencing see above.   Patient may benefit from see above.  Plan: Follow up with behavioral health clinician on : 12/30/2021 at 11:00 AM Behavioral recommendations:  Referral(s): [place] (In Clinic)  I discussed the assessment and treatment plan with the patient and/or parent/guardian. They were provided an opportunity to ask questions and all were answered. They agreed with the plan and demonstrated an understanding of the instructions.   They were advised to call back or seek an in-person evaluation if the symptoms worsen or if the condition fails to improve as anticipated.  Lesli Albee, LCSWA

## 2021-12-23 ENCOUNTER — Other Ambulatory Visit: Payer: Self-pay

## 2021-12-30 ENCOUNTER — Other Ambulatory Visit: Payer: Self-pay

## 2021-12-30 ENCOUNTER — Ambulatory Visit: Payer: Self-pay | Admitting: Licensed Clinical Social Worker

## 2022-01-06 ENCOUNTER — Ambulatory Visit: Payer: Self-pay | Admitting: Licensed Clinical Social Worker

## 2022-01-06 ENCOUNTER — Other Ambulatory Visit: Payer: Self-pay

## 2022-01-06 DIAGNOSIS — F411 Generalized anxiety disorder: Secondary | ICD-10-CM

## 2022-01-06 DIAGNOSIS — F4310 Post-traumatic stress disorder, unspecified: Secondary | ICD-10-CM

## 2022-01-06 DIAGNOSIS — F331 Major depressive disorder, recurrent, moderate: Secondary | ICD-10-CM

## 2022-01-06 NOTE — BH Specialist Note (Signed)
Integrated Behavioral Health via Telemedicine Visit  01/06/2022 Courtney Stafford [id number]   Referring Provider: [name], NP  Patient/Family location: The patient's address  [facility] Provider location: [facility] All persons participating in visit: Courtney Stafford and Courtney Stafford,  Types of Service: Telephone visit  I connected with Courtney Stafford via Telephone or Video Enabled Telemedicine Application  (Video is Caregility application) and verified that I am speaking with the correct person using two identifiers. Discussed confidentiality: Yes   I discussed the limitations of telemedicine and the availability of in person appointments.  Discussed there is a possibility of technology failure and discussed alternative modes of communication if that failure occurs.  Patient and/or legal guardian expressed understanding and consented to Telemedicine visit: Yes   Presenting Concerns: Patient and/or family reports the following symptoms/concerns: The patient reports that she has been doing about the same since her last follow-up appointment. She reports that she has been enjoying the warm dry weather and has been siting out on her porch enjoying fresh air. Courtney Stafford discussed financial and health stressors impacting her life currently. She noted that she contacted the [facility] and was told that she could receive two free rides per month until June then she would have to pay. The patient noted that she has spent  several hours of her day thinking about how she can get to the store, her doctors appointment, and pharmacy. She noted that she struggles with worrying and feels she is a nervous person in general. Courtney Stafford discussed family and interpersonal relationships and noted she will go to stay with her daughter for a week in June to babysit her grandchildren. She shared that she often thinks of reasons not to go, but knows this will be good for  her to be around her family. The patient reports that she is taking Mirtazapine 15 MG at bedtime nightly as prescribed. She requested the clinician message her primary care provider to request a refill. Courtney Stafford denied any suicidal or homicidal thoughts. Duration of problem: Years; Severity of problem: moderate  Patient and/or Family's Strengths/Protective Factors: Concrete supports in place (healthy food, safe environments, etc.)  Goals Addressed: Patient will:  Reduce symptoms of: agitation, anxiety, depression, insomnia, and stress   Increase knowledge and/or ability of: coping skills, healthy habits, self-management skills, and stress reduction   Demonstrate ability to: Increase healthy adjustment to current life circumstances and Increase adequate support systems for patient/family  Progress towards Goals: Ongoing  Interventions: Interventions utilized:  CBT Cognitive Behavioral Therapy was utilized by the clinician during today's follow up session. Clinician met with patient to identify needs related to stressors and functioning, and assess and monitor for signs and symptoms of anxiety and depression, and assess safety. The clinician processed with the patient how they have been doing since the last follow-up session.  Clinician measured the patient's anxiety and depression on a numerical scale. Clinician assessed the patient's compliance with the physicians prescription for psychotropic medication and monitored for the medications effectiveness and side effects. Clinician messaged the patient's primary care provider and requested a refill.  Clinician encouraged the patient to implement her coping skills and practice them each day to help her deal with her current anxiety symptoms.   Standardized Assessments completed: GAD-7 and PHQ 9 GAD-7=  11 PHQ-9 = 12  Assessment: Patient currently experiencing see above.   Patient may benefit from see above.  Plan: Follow up with behavioral  health clinician on : 01/14/2022  at 11:00 AM.  Behavioral recommendations:  Referral(s): [place] (In Clinic)  I discussed the assessment and treatment plan with the patient and/or parent/guardian. They were provided an opportunity to ask questions and all were answered. They agreed with the plan and demonstrated an understanding of the instructions.   They were advised to call back or seek an in-person evaluation if the symptoms worsen or if the condition fails to improve as anticipated.  Lesli Albee, LCSWA

## 2022-01-12 ENCOUNTER — Other Ambulatory Visit: Payer: Self-pay | Admitting: Gerontology

## 2022-01-12 ENCOUNTER — Other Ambulatory Visit: Payer: Self-pay

## 2022-01-12 DIAGNOSIS — F411 Generalized anxiety disorder: Secondary | ICD-10-CM

## 2022-01-12 MED FILL — Glucose Blood Test Strip: 13 days supply | Qty: 50 | Fill #0 | Status: AC

## 2022-01-12 MED FILL — Mirtazapine Tab 15 MG: ORAL | 30 days supply | Qty: 30 | Fill #0 | Status: AC

## 2022-01-12 MED FILL — Insulin Syringe/Needle U-100 1 ML 31 x 15/64": Qty: 90 | Fill #0 | Status: CN

## 2022-01-12 MED FILL — Gabapentin Cap 300 MG: ORAL | 30 days supply | Qty: 210 | Fill #1 | Status: AC

## 2022-01-13 ENCOUNTER — Other Ambulatory Visit: Payer: Self-pay

## 2022-01-14 ENCOUNTER — Ambulatory Visit: Payer: Self-pay | Admitting: Licensed Clinical Social Worker

## 2022-01-19 ENCOUNTER — Other Ambulatory Visit: Payer: Self-pay

## 2022-01-19 MED FILL — Glucose Blood Test Strip: 50 days supply | Qty: 200 | Fill #1 | Status: AC

## 2022-01-20 ENCOUNTER — Other Ambulatory Visit: Payer: Self-pay

## 2022-01-20 ENCOUNTER — Ambulatory Visit: Payer: Self-pay | Admitting: Gerontology

## 2022-01-21 ENCOUNTER — Ambulatory Visit: Payer: Self-pay | Admitting: Licensed Clinical Social Worker

## 2022-01-21 ENCOUNTER — Other Ambulatory Visit: Payer: Self-pay

## 2022-01-21 DIAGNOSIS — F331 Major depressive disorder, recurrent, moderate: Secondary | ICD-10-CM

## 2022-01-21 DIAGNOSIS — F4310 Post-traumatic stress disorder, unspecified: Secondary | ICD-10-CM

## 2022-01-21 DIAGNOSIS — F411 Generalized anxiety disorder: Secondary | ICD-10-CM

## 2022-01-21 NOTE — BH Specialist Note (Signed)
Integrated Behavioral Health via Telemedicine Visit ? ?01/21/2022 ?Courtney Stafford ?[id number] ? ?Number of [facility] Clinician visits: No data recorded ?Session Start time: No data recorded  ?Session End time: No data recorded ?Total time in minutes: No data recorded ? ?Referring Provider: [name], NP  ?Patient/Family location: The patient's home ?[facility] Provider location: [facility] ?All persons participating in visit: Akyra Bouchie. [place] and [name], MSW,LCSW-A ?Types of Service: Telephone visit ? ?I connected with [name] via  Telephone or Video Enabled Telemedicine Application  (Video is Caregility application) and verified that I am speaking with the correct person using two identifiers. Discussed confidentiality: Yes  ? ?I discussed the limitations of telemedicine and the availability of in person appointments.  Discussed there is a possibility of technology failure and discussed alternative modes of communication if that failure occurs. ? ?Patient and/or legal guardian expressed understanding and consented to Telemedicine visit: Yes  ? ?Presenting Concerns: ?Patient and/or family reports the following symptoms/concerns: The patient reports that she has been doing about the same since her last follow-up appointment. She noted that last week she was not feeling well because three people she knew passed away. She shared that a highschool friend of her sister lost her brother. Additionally, she shared her sister's boyfriend was shot and later died in a card game last week. The patient stated that she was trying to process her feelings and did not feel she could come to her session. Niemah noted that she feels much better this week; however, she shared that she is struggling more with thoughts of "what if."  The patient asked if [place] had started back patient transportation services again? She explained that medication management told her they still offered  rides if providers requested them. Kalista denied any suicidal or homicidal thoughts.  ?Duration of problem: Years ; Severity of problem: moderate ? ?Patient and/or Family's Strengths/Protective Factors: ?Concrete supports in place (healthy food, safe environments, etc.) and Sense of purpose ? ?Goals Addressed: ?Patient will: ? Reduce symptoms of: agitation, anxiety, depression, insomnia, and stress  ? Increase knowledge and/or ability of: coping skills, healthy habits, self-management skills, and stress reduction  ? Demonstrate ability to: Increase healthy adjustment to current life circumstances and Increase adequate support systems for patient/family ? ?Progress towards Goals: ?Ongoing ? ?Interventions: ?Interventions utilized:  CBT Cognitive Behavioral Therapywas utilized by the clinician during today's follow up session. Clinician met with patient to identify needs related to stressors and functioning, and assess and monitor for signs and symptoms of anxiety and depression, and assess safety. The clinician processed with the patient how they have been doing since the last follow-up session. Clinician measured the patient's anxiety and depression on a numerical scale. Clinician discuss ed with the patient how coping skills, replacing negative thoughts with more positive ones, and exposure help to build confidence, desensitize and overcome fears, and see one's self, others, and the world in a less fearful and/or depressing way. Clinician encouraged the client to practice her coping skills daily this week even during times when she did not feel distress. Clinician offered to check on [place] transportation to see if there were further updates. The session ended with scheduling.  ?Standardized Assessments completed: GAD-7 and PHQ 9 ?PHQ-9= 12 ?GAD-7=11 ? ?Assessment: ?Patient currently experiencing see above.  ? ?Patient may benefit from see above. ? ?Plan: ?Follow up with behavioral health clinician on :  01/28/2022 at noon ?Behavioral recommendations:  ?Referral(s): [facility] (In Clinic) ? ?I discussed the assessment and treatment plan with the patient and/or parent/guardian. They were provided an opportunity to ask questions and all were answered. They agreed with the plan and demonstrated an understanding of the instructions. ?  ?They were advised to call back or seek an in-person evaluation if the symptoms worsen or if the condition fails to improve as anticipated. ? ?Heather J Pruitt, LCSWA ?

## 2022-01-25 ENCOUNTER — Other Ambulatory Visit: Payer: Self-pay

## 2022-01-28 ENCOUNTER — Ambulatory Visit: Payer: Self-pay | Admitting: Licensed Clinical Social Worker

## 2022-01-28 ENCOUNTER — Other Ambulatory Visit: Payer: Self-pay

## 2022-01-28 DIAGNOSIS — F331 Major depressive disorder, recurrent, moderate: Secondary | ICD-10-CM

## 2022-01-28 DIAGNOSIS — F411 Generalized anxiety disorder: Secondary | ICD-10-CM

## 2022-01-28 DIAGNOSIS — F4310 Post-traumatic stress disorder, unspecified: Secondary | ICD-10-CM

## 2022-01-28 NOTE — BH Specialist Note (Signed)
Integrated Behavioral Health via Telemedicine Visit ? ?01/28/2022 ?CISSY GALBREATH ?[id number] ? ?Number of [place] Clinician visits: No data recorded ?Session Start time: No data recorded  ?Session End time: No data recorded ?Total time in minutes: No data recorded ? ?Referring Provider: [name], NP  ?Patient/Family location: The patient's home ?[facility] Provider location: [facility] ?All persons participating in visit: Harlin Rain and [name], MSW, LCSW-A ?Types of Service: Telephone visit ? ?I connected with [name] via Telephone or Video Enabled Telemedicine Application  (Video is Caregility application) and verified that I am speaking with the correct person using two identifiers. Discussed confidentiality: Yes  ? ?I discussed the limitations of telemedicine and the availability of in person appointments.  Discussed there is a possibility of technology failure and discussed alternative modes of communication if that failure occurs. ? ?Patient and/or legal guardian expressed understanding and consented to Telemedicine visit: Yes  ? ?Presenting Concerns: ?Patient and/or family reports the following symptoms/concerns: The patient reports that she has been doing okay since her last follow-up appointment. She shared that last week she had thoughts about wishing she had never been born and thoughts about being better off dead. However Kierah denied having a plan to end her life or access to means to carry out a plan. She stated that the thoughts passed quickly and she did not want to die. Fortunata noted that she does not believe in suicide and would not want to hurt her daughter and grandchildren. She shared that she has had these thoughts before and she watches television or distracts herself with games on her phone when they occur. She  stated that she would call [phone], 911, or go to the nearest emergency department if she experienced worsening suicidal thoughts.  Jinnie noted that she would like to have a puppy someday. She shared that she had a dog as a child, but has never had a pet of her own. She stated that her current landlord prohibits pets. She noted that since she has been renting  the same home for ten years she hoped someday the landlord would change his mind. The patient noted that she dropped her television and broke it on accident. She explained that she has a larger television that she moves from one room to another, but is worried she may also drop that one. Zachary shared that she has been walking some, but has to turn around after about a half a block because she is afraid her knees will give out and because of back pain. She shared that she continues to worry about transportation and is not sure if she will be able to make it to her doctor appointment next week. The patient denied any suicidal or homicidal thoughts.  ?Duration of problem: Years; Severity of problem: moderate ? ?Patient and/or Family's Strengths/Protective Factors: ?Concrete supports in place (healthy food, safe environments, etc.) and Sense of purpose ? ?Goals Addressed: ?Patient will: ? Reduce symptoms of: agitation, anxiety, depression, and stress  ? Increase knowledge and/or ability of: coping skills, healthy habits, self-management skills, and stress reduction  ? Demonstrate ability to: Increase healthy adjustment to current life circumstances and Increase adequate support systems for patient/family ? ?Progress towards Goals: ?Ongoing ? ?Interventions: ?Interventions utilized:  CBT Cognitive Behavioral Therapy was utilized by the clinician during today's follow up session. Clinician met with patient to identify needs related to stressors and functioning, and assess and monitor for signs and symptoms of anxiety and depression,  and assess safety. The clinician processed with the patient how they have been doing since the last follow-up session. Clinician measured the patient's anxiety and  depression on a numerical scale. Clinician completed a risk assessment to determine level of severity when it comes to patient's suicidal thoughts. Clinician determined that the patient is competent, thinking clearly, orientated x 3, able to make own medical decisions, and is not acutely suicidal; patient was provided crisis service information for [place], [place] 24/7 crisis line [phone] , 988, 911, and the nearest emergency department. Clinician introduced additional distress tolerance skills to the patient and explained that negative emotions will usually lessen in intensity and pass over time.  Clinician intervened with positive regard and optimism to validate client's emotions, and supported client in exploring ways increase her social connections. The session ended with scheduling.  ?Standardized Assessments completed: GAD-7 and PHQ 9 ?GAD-7 =  11 ?PHQ-9 =  17 ? ?Assessment: ?Patient currently experiencing see above.  ? ?Patient may benefit from see above. ? ?Plan: ?Follow up with behavioral health clinician on : 02/03/2022 at 2:00 PM  ?Behavioral recommendations:  ?Referral(s): [place] (In Clinic) ? ?I discussed the assessment and treatment plan with the patient and/or parent/guardian. They were provided an opportunity to ask questions and all were answered. They agreed with the plan and demonstrated an understanding of the instructions. ?  ?They were advised to call back or seek an in-person evaluation if the symptoms worsen or if the condition fails to improve as anticipated. ? ?Lesli Albee, LCSWA ?

## 2022-02-02 ENCOUNTER — Encounter: Payer: Self-pay | Admitting: Gerontology

## 2022-02-02 ENCOUNTER — Emergency Department: Payer: No Typology Code available for payment source

## 2022-02-02 ENCOUNTER — Other Ambulatory Visit: Payer: Self-pay

## 2022-02-02 ENCOUNTER — Ambulatory Visit: Payer: Self-pay | Admitting: Gerontology

## 2022-02-02 ENCOUNTER — Emergency Department
Admission: EM | Admit: 2022-02-02 | Discharge: 2022-02-02 | Disposition: A | Discharge status: Home / Self Care | Payer: No Typology Code available for payment source | Attending: Emergency Medicine | Admitting: Emergency Medicine

## 2022-02-02 VITALS — BP 168/110 | HR 90 | Temp 97.8°F | Resp 17 | Ht 60.0 in | Wt 200.5 lb

## 2022-02-02 DIAGNOSIS — R0602 Shortness of breath: Secondary | ICD-10-CM

## 2022-02-02 DIAGNOSIS — E1165 Type 2 diabetes mellitus with hyperglycemia: Secondary | ICD-10-CM | POA: Insufficient documentation

## 2022-02-02 DIAGNOSIS — I10 Essential (primary) hypertension: Secondary | ICD-10-CM | POA: Insufficient documentation

## 2022-02-02 DIAGNOSIS — M6281 Muscle weakness (generalized): Secondary | ICD-10-CM | POA: Insufficient documentation

## 2022-02-02 DIAGNOSIS — R29898 Other symptoms and signs involving the musculoskeletal system: Secondary | ICD-10-CM

## 2022-02-02 DIAGNOSIS — R739 Hyperglycemia, unspecified: Secondary | ICD-10-CM

## 2022-02-02 DIAGNOSIS — R079 Chest pain, unspecified: Secondary | ICD-10-CM | POA: Insufficient documentation

## 2022-02-02 DIAGNOSIS — E876 Hypokalemia: Secondary | ICD-10-CM | POA: Insufficient documentation

## 2022-02-02 DIAGNOSIS — R531 Weakness: Secondary | ICD-10-CM

## 2022-02-02 LAB — CBC WITH DIFFERENTIAL/PLATELET
Abs Immature Granulocytes: 0.03 10*3/uL (ref 0.00–0.07)
Basophils Absolute: 0.1 10*3/uL (ref 0.0–0.1)
Basophils Relative: 1 %
Eosinophils Absolute: 0.2 10*3/uL (ref 0.0–0.5)
Eosinophils Relative: 3 %
HCT: 48 % — ABNORMAL HIGH (ref 36.0–46.0)
Hemoglobin: 15.5 g/dL — ABNORMAL HIGH (ref 12.0–15.0)
Immature Granulocytes: 1 %
Lymphocytes Relative: 22 %
Lymphs Abs: 1.5 10*3/uL (ref 0.7–4.0)
MCH: 27.8 pg (ref 26.0–34.0)
MCHC: 32.3 g/dL (ref 30.0–36.0)
MCV: 86 fL (ref 80.0–100.0)
Monocytes Absolute: 0.6 10*3/uL (ref 0.1–1.0)
Monocytes Relative: 10 %
Neutro Abs: 4.3 10*3/uL (ref 1.7–7.7)
Neutrophils Relative %: 63 %
Platelets: 298 10*3/uL (ref 150–400)
RBC: 5.58 MIL/uL — ABNORMAL HIGH (ref 3.87–5.11)
RDW: 13.7 % (ref 11.5–15.5)
WBC: 6.7 10*3/uL (ref 4.0–10.5)
nRBC: 0 % (ref 0.0–0.2)

## 2022-02-02 LAB — COMPREHENSIVE METABOLIC PANEL
ALT: 19 U/L (ref 0–44)
AST: 22 U/L (ref 15–41)
Albumin: 3.8 g/dL (ref 3.5–5.0)
Alkaline Phosphatase: 105 U/L (ref 38–126)
Anion gap: 8 (ref 5–15)
BUN: 10 mg/dL (ref 6–20)
CO2: 30 mmol/L (ref 22–32)
Calcium: 9.1 mg/dL (ref 8.9–10.3)
Chloride: 97 mmol/L — ABNORMAL LOW (ref 98–111)
Creatinine, Ser: 0.73 mg/dL (ref 0.44–1.00)
GFR, Estimated: >60 mL/min (ref >60)
Glucose, Bld: 275 mg/dL — ABNORMAL HIGH (ref 70–99)
Potassium: 3.2 mmol/L — ABNORMAL LOW (ref 3.5–5.1)
Sodium: 135 mmol/L (ref 135–145)
Total Bilirubin: 0.6 mg/dL (ref 0.3–1.2)
Total Protein: 7.4 g/dL (ref 6.5–8.1)

## 2022-02-02 LAB — URINALYSIS, ROUTINE W REFLEX MICROSCOPIC
Bilirubin Urine: NEGATIVE
Glucose, UA: >=500 mg/dL — AB
Hgb urine dipstick: NEGATIVE
Ketones, ur: 20 mg/dL — AB
Leukocytes,Ua: NEGATIVE
Nitrite: NEGATIVE
Protein, ur: NEGATIVE mg/dL
Specific Gravity, Urine: 1.014 (ref 1.005–1.030)
pH: 7 (ref 5.0–8.0)

## 2022-02-02 LAB — TROPONIN I (HIGH SENSITIVITY)
Troponin I (High Sensitivity): 7 ng/L (ref <18)
Troponin I (High Sensitivity): 6 ng/L (ref <18)

## 2022-02-02 LAB — GLUCOSE, POCT (MANUAL RESULT ENTRY): POC Glucose: 285 mg/dl — AB (ref 70–99)

## 2022-02-02 LAB — D-DIMER, QUANTITATIVE: D-Dimer, Quant: 0.37 ug/mL-FEU (ref 0.00–0.50)

## 2022-02-02 LAB — MAGNESIUM: Magnesium: 2.1 mg/dL (ref 1.7–2.4)

## 2022-02-02 LAB — BETA-HYDROXYBUTYRIC ACID: Beta-Hydroxybutyric Acid: 0.41 mmol/L — ABNORMAL HIGH (ref 0.05–0.27)

## 2022-02-02 IMAGING — CT CT HEAD W/O CM
4 series · 17 of 47 positions shown, 19 images · non-contrast
Comparison: [DATE]

CLINICAL DATA: Generalized weakness

Fall
Chest pain
Shortness of breath
Bilateral knee pain
EXAM:
CT HEAD WITHOUT CONTRAST
TECHNIQUE: Contiguous axial images were obtained from the base of the skull
through the vertex without intravenous contrast.
RADIATION DOSE REDUCTION: This exam was performed according to the
departmental dose-optimization program which includes automated
exposure control, adjustment of the mA and/or kV according to
patient size and/or use of iterative reconstruction technique.

[Series 2: head wo · axial · 0.40mm/px · z∈[+1072,+1187]mm · 7 of 31 slices shown, 9 images]
[im 4/31  brain]
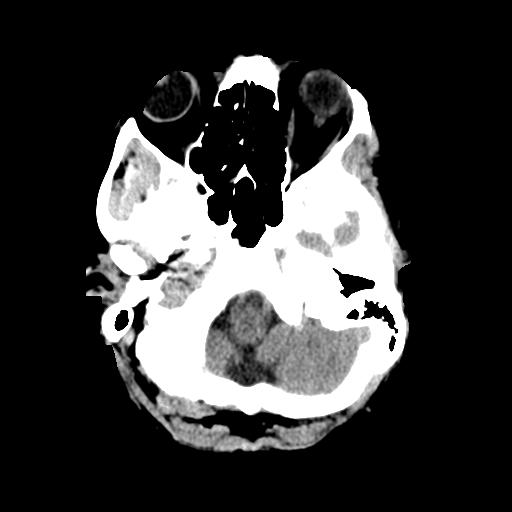
[im 4/31  bone]
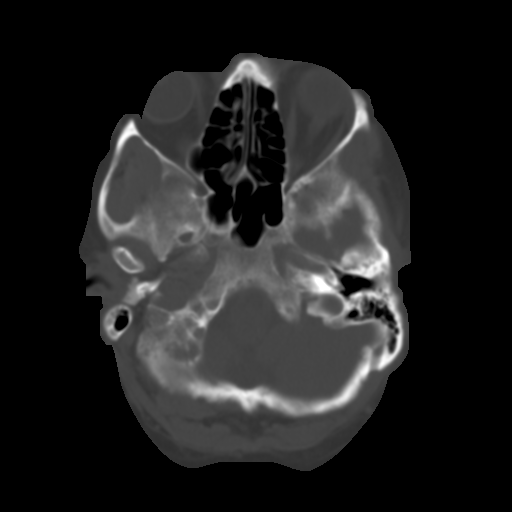
[im 8/31  brain]
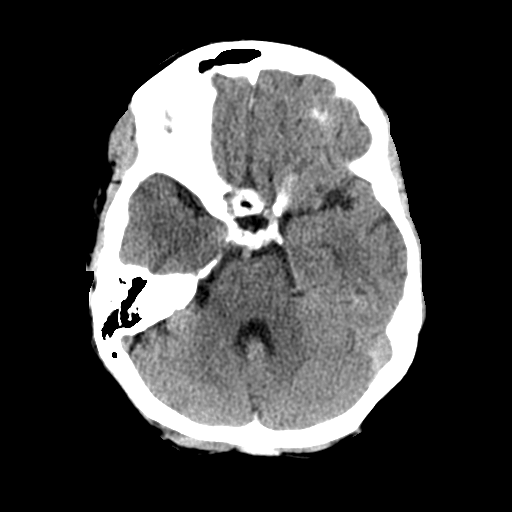
[im 12/31  brain]
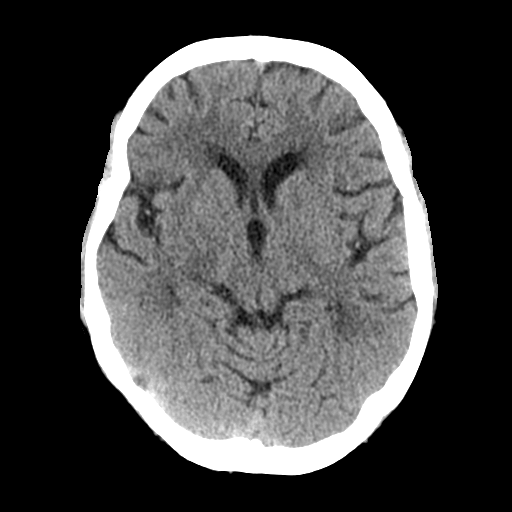
[im 16/31  brain]
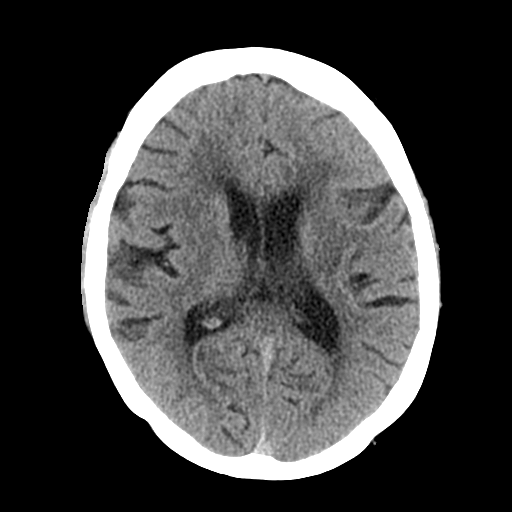
[im 19/31  brain]
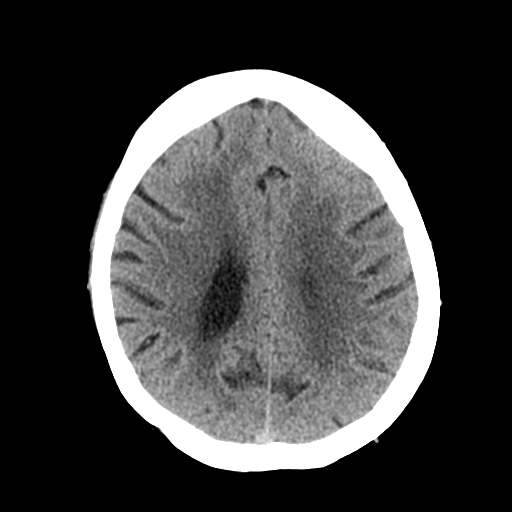
[im 19/31  bone]
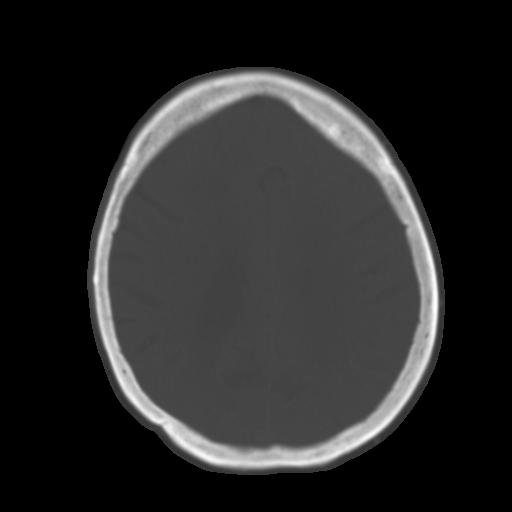
[im 23/31  brain]
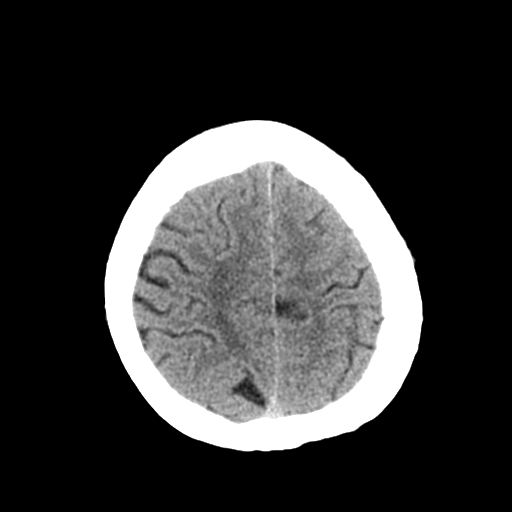
[im 27/31  brain]
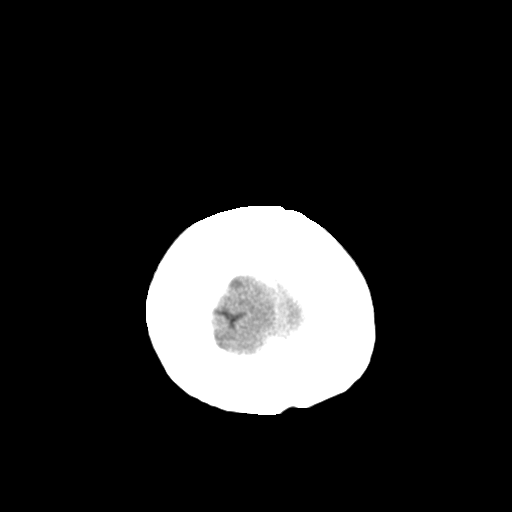

[Series 3: head bone · axial · 0.40mm/px · z∈[+1071,+1123]mm · 4 of 76 slices shown]
[im 8/76  bone]
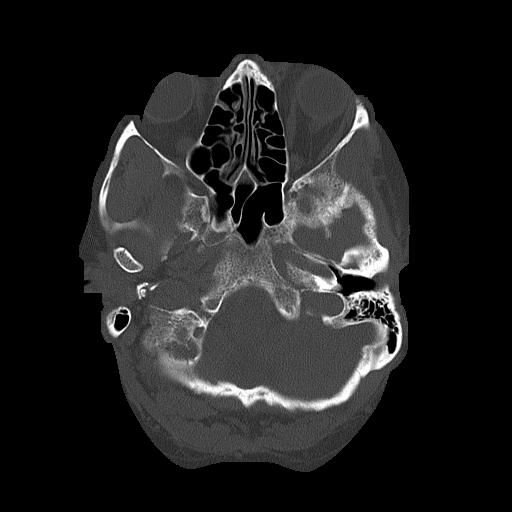
[im 16/76  bone]
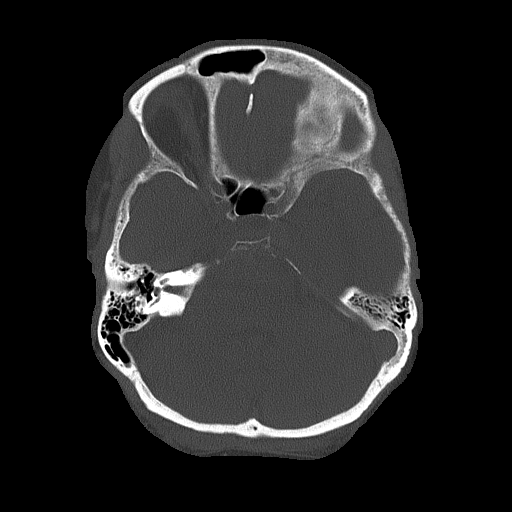
[im 23/76  bone]
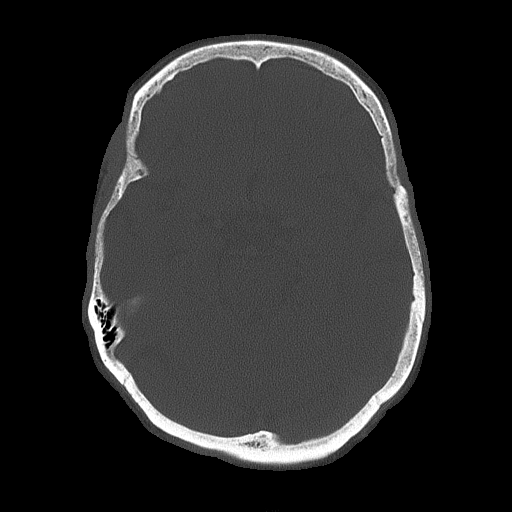
[im 34/76  bone]
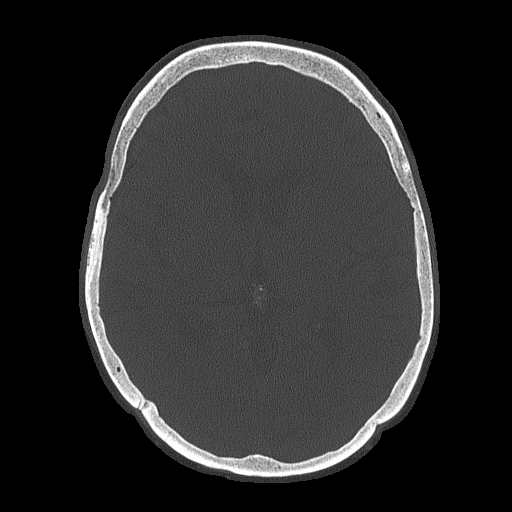

[Series 4: coronal soft tissue · coronal · 0.31mm/px · 3 of 62 slices shown]
[im 21/62  brain]
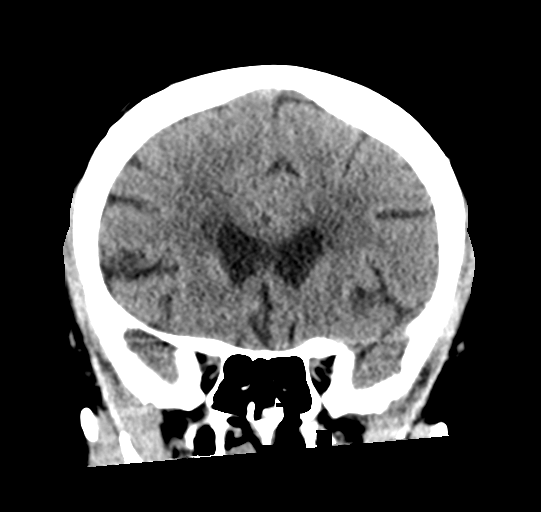
[im 28/62  brain]
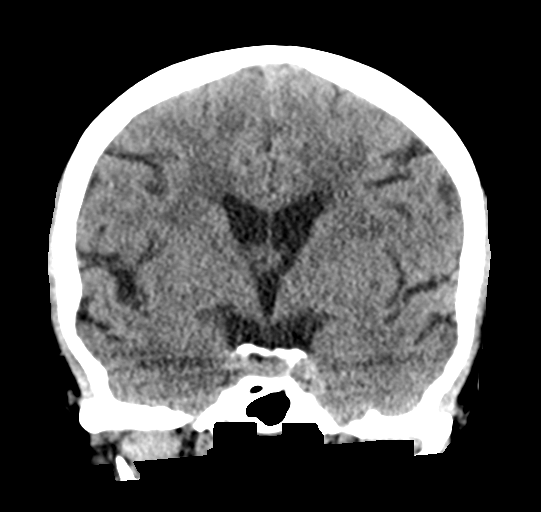
[im 34/62  brain]
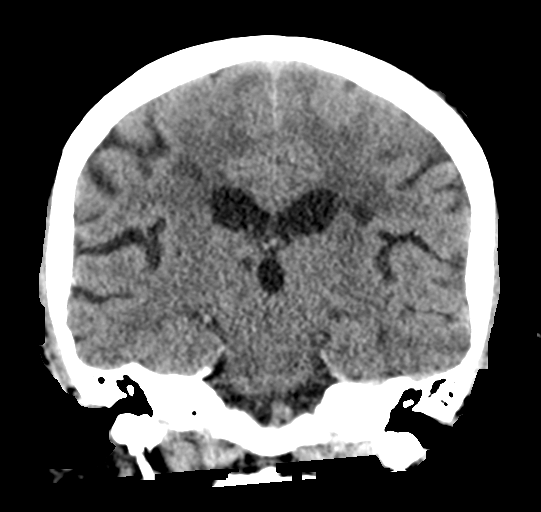

[Series 5: sagittal soft tissue · sagittal · 0.31mm/px · 3 of 57 slices shown]
[im 19/57  brain]
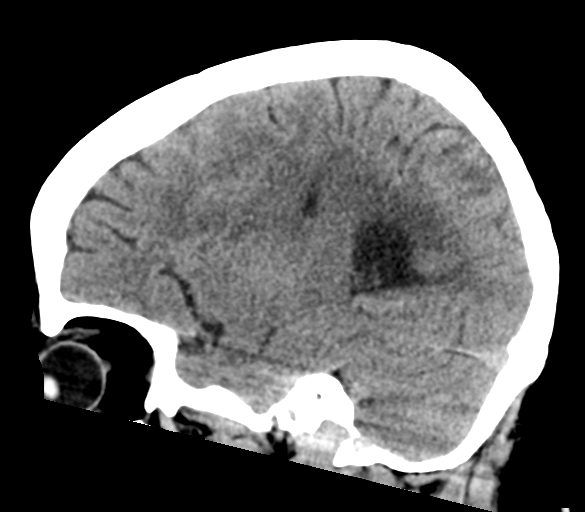
[im 29/57  brain]
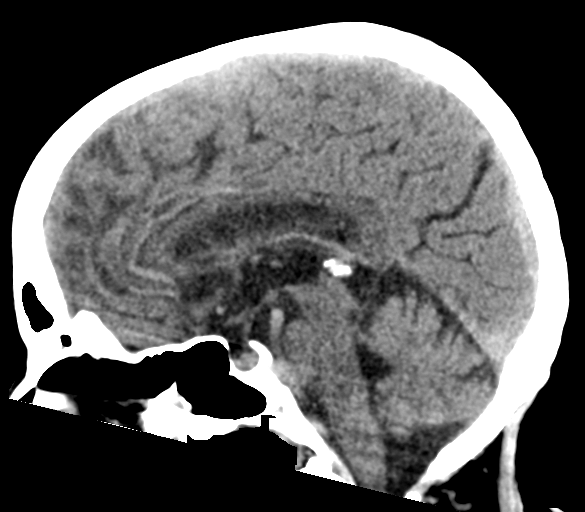
[im 38/57  brain]
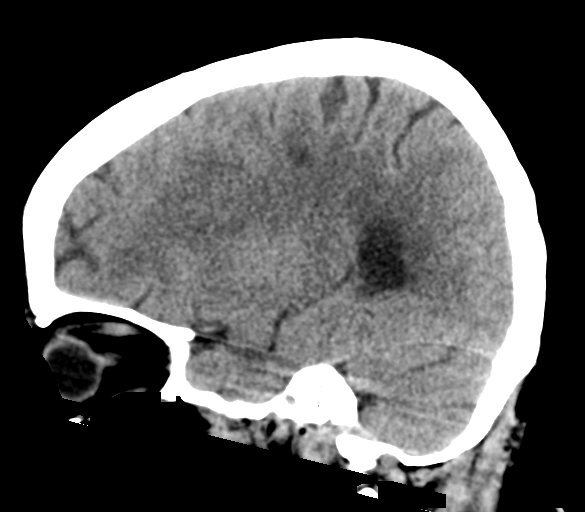

[17 of 47 positions shown; findings below may reference images not displayed]

FINDINGS: Brain: No evidence of acute infarction, hemorrhage, hydrocephalus,
extra-axial collection or mass lesion/mass effect. Periventricular
white matter hypodensity is a nonspecific finding, but most commonly
relates to chronic ischemic small vessel disease.

Vascular: No hyperdense vessel or unexpected calcification.

Skull: Normal. Negative for fracture or focal lesion.

Sinuses/Orbits: No acute finding.

Other: None.
IMPRESSION: No acute intracranial abnormality.

## 2022-02-02 IMAGING — CR DG CHEST 1V PORT
1 series · 1 of 1 positions shown · non-contrast
Comparison: Chest x-ray dated [DATE]

CLINICAL DATA: Chest pain

EXAM:
PORTABLE CHEST 1 VIEW

[dg chest port 1 view]
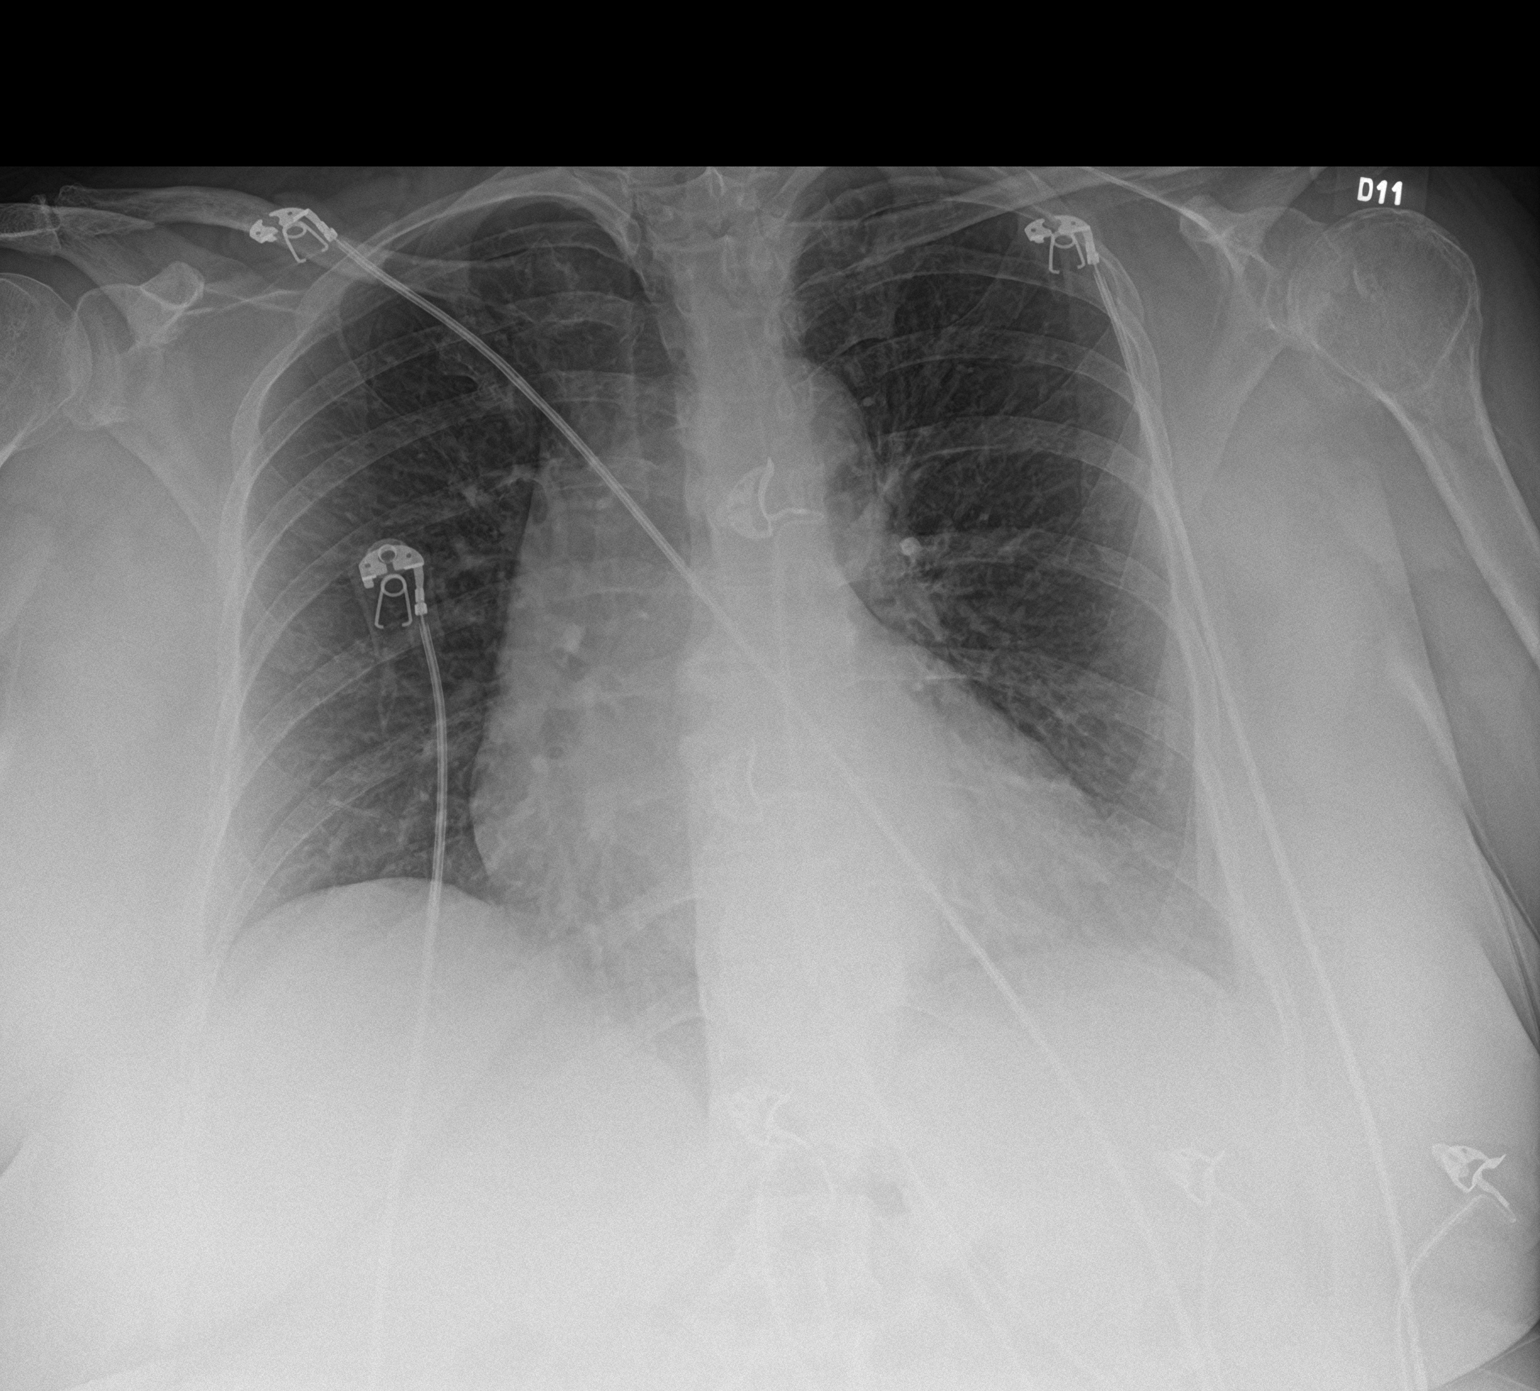

[1 of 1 positions shown; findings below may reference images not displayed]

FINDINGS: Increased widening of the cardiac and mediastinal contours is likely
artifactual and related to rightward patient rotation. Both lungs
are clear. The visualized skeletal structures are unremarkable.
IMPRESSION: 1. Lungs are clear.
2. Increased widening of the cardiac and mediastinal contours is
likely artifactual and related to rightward patient rotation. If
there is clinical concern, repeat PA and lateral chest x-ray could
be performed.

## 2022-02-02 MED ORDER — LACTATED RINGERS IV BOLUS
1000.0000 mL | Freq: Once | INTRAVENOUS | Status: AC
  Administered 2022-02-02: 1000 mL via INTRAVENOUS

## 2022-02-02 MED ORDER — POTASSIUM CHLORIDE 10 MEQ/100ML IV SOLN
10.0000 meq | Freq: Once | INTRAVENOUS | Status: AC
  Administered 2022-02-02: 10 meq via INTRAVENOUS
  Filled 2022-02-02: qty 100

## 2022-02-02 MED ORDER — POTASSIUM CHLORIDE CRYS ER 20 MEQ PO TBCR
40.0000 meq | EXTENDED_RELEASE_TABLET | Freq: Once | ORAL | Status: AC
  Administered 2022-02-02: 40 meq via ORAL
  Filled 2022-02-02: qty 2

## 2022-02-02 NOTE — ED Triage Notes (Signed)
First Nurse Note: ?Pt observed walking into the ED without difficulty from parking lot and into the Lobby without difficulty, once up to the first nurse desk she bent over in pain, pt placed in wheelchair.  ?

## 2022-02-02 NOTE — ED Provider Notes (Signed)
? ?[facility] ?Provider Note ? ? ? Event Date/Time  ? First MD Initiated Contact with Patient 02/02/22 1216   ?  (approximate) ? ? ?History  ? ?Weakness ? ? ?HPI ? ?Courtney Stafford is a 51 y.o. female who presents to the ED for evaluation of Weakness ?  ?I reviewed PCP visit from earlier today.  Patient was seen for follow-up, but referred to the ED.  She has a history of type 1 diabetes, hypertension, stroke. ? ?Patient presents to the ED for evaluation of generalized weakness, chest pain and dyspnea.  She reports a fall this past Sunday that she has difficulty describing.  Cannot definitively say if it was mechanical or if she syncopized.  She reports soreness to her bilateral knees after this, but has been up and ambulatory since then.  She reports sometimes using a walker for ambulation, that she has since her stroke 2 years ago. ? ?Reports intermittent chest pain and intermittent dyspnea without cough, fever or other episodes of falling or syncope. ? ?Physical Exam  ? ?Triage Vital Signs: ?ED Triage Vitals  ?Enc Vitals Group  ?   BP 02/02/22 1218 (!) 153/115  ?   Pulse Rate 02/02/22 1218 (!) 118  ?   Resp 02/02/22 1218 20  ?   Temp 02/02/22 1218 98.6 ?F (37 ?C)  ?   Temp Source 02/02/22 1218 Oral  ?   SpO2 02/02/22 1218 99 %  ?   Weight 02/02/22 1219 200 lb (90.7 kg)  ?   Height 02/02/22 1219 5' (1.524 m)  ?   Head Circumference --   ?   Peak Flow --   ?   Pain Score 02/02/22 1219 10  ?   Pain Loc --   ?   Pain Edu? --   ?   Excl. in GC? --   ? ? ?Most recent vital signs: ?Vitals:  ? 02/02/22 1310 02/02/22 1335  ?BP:  (!) 159/99  ?Pulse: 78 84  ?Resp: 18 19  ?Temp:    ?SpO2: 98% 99%  ? ? ?General: Awake, no distress.  Obese.  Appears uncomfortable. ?CV:  Good peripheral perfusion.  Tachycardic and regular ?Resp:  Normal effort.  ?Abd:  No distention.  Soft and benign throughout ?MSK:  No deformity noted.  ?Neuro:  No focal deficits appreciated. Cranial nerves II through XII intact ?5/5  strength and sensation in all 4 extremities ?She is slower to raise her right leg compared to her left, but she keeps in the air for at least 10 seconds and has good strength.  She uses her bilateral legs symmetrically to push herself up in bed independently when she is slumped down. ?Other:   ? ? ?ED Results / Procedures / Treatments  ? ?Labs ?(all labs ordered are listed, but only abnormal results are displayed) ?Labs Reviewed  ?COMPREHENSIVE METABOLIC PANEL - Abnormal; Notable for the following components:  ?    Result Value  ? Potassium 3.2 (*)   ? Chloride 97 (*)   ? Glucose, Bld 275 (*)   ? All other components within normal limits  ?CBC WITH DIFFERENTIAL/PLATELET - Abnormal; Notable for the following components:  ? RBC 5.58 (*)   ? Hemoglobin 15.5 (*)   ? HCT 48.0 (*)   ? All other components within normal limits  ?BETA-HYDROXYBUTYRIC ACID - Abnormal; Notable for the following components:  ? Beta-Hydroxybutyric Acid 0.41 (*)   ? All other components within normal limits  ?MAGNESIUM  ?  D-DIMER, QUANTITATIVE  ?URINALYSIS, ROUTINE W REFLEX MICROSCOPIC  ?TROPONIN I (HIGH SENSITIVITY)  ?TROPONIN I (HIGH SENSITIVITY)  ? ? ?EKG ?Sinus rhythm, rate of 86 bpm.  Normal axis and intervals.  Nonspecific ST changes inferiorly and laterally.  Some wandering baseline.  No clear STEMI. ? ?RADIOLOGY ?CT scan of the head reviewed by me without evidence of acute intracranial pathology. ?CXR reviewed by me without evidence of acute cardiopulmonary pathology. ? ?Official radiology report(s): ?CT HEAD WO CONTRAST ( ) ? ?Result Date: 02/02/2022 ?CLINICAL DATA:  Generalized weakness Fall Chest pain Shortness of breath Bilateral knee pain EXAM: CT HEAD WITHOUT CONTRAST TECHNIQUE: Contiguous axial images were obtained from the base of the skull through the vertex without intravenous contrast. RADIATION DOSE REDUCTION: This exam was performed according to the departmental dose-optimization program which includes automated exposure  control, adjustment of the mA and/or kV according to patient size and/or use of iterative reconstruction technique. COMPARISON:  07/31/2020 FINDINGS: Brain: No evidence of acute infarction, hemorrhage, hydrocephalus, extra-axial collection or mass lesion/mass effect. Periventricular white matter hypodensity is a nonspecific finding, but most commonly relates to chronic ischemic small vessel disease. Vascular: No hyperdense vessel or unexpected calcification. Skull: Normal. Negative for fracture or focal lesion. Sinuses/Orbits: No acute finding. Other: None. IMPRESSION: No acute intracranial abnormality. Electronically Signed   By: Acquanetta Belling M.D.   On: 02/02/2022 13:36  ? ?DG Chest Portable 1 View ? ?Result Date: 02/02/2022 ?CLINICAL DATA:  Chest pain EXAM: PORTABLE CHEST 1 VIEW COMPARISON:  Chest x-ray dated November 13, 2012 FINDINGS: Increased widening of the cardiac and mediastinal contours is likely artifactual and related to rightward patient rotation. Both lungs are clear. The visualized skeletal structures are unremarkable. IMPRESSION: 1. Lungs are clear. 2. Increased widening of the cardiac and mediastinal contours is likely artifactual and related to rightward patient rotation. If there is clinical concern, repeat PA and lateral chest x-ray could be performed. Electronically Signed   By: Allegra Lai M.D.   On: 02/02/2022 13:48   ? ?PROCEDURES and INTERVENTIONS: ? ?.1-3 Lead EKG Interpretation ?Performed by: Delton Prairie, MD ?Authorized by: Delton Prairie, MD  ? ?  Interpretation: abnormal   ?  ECG rate:  110 ?  ECG rate assessment: tachycardic   ?  Rhythm: sinus tachycardia   ?  Ectopy: none   ?  Conduction: normal   ? ?Medications  ?potassium chloride 10 mEq in 100 mL IVPB (10 mEq Intravenous New Bag/Given 02/02/22 1402)  ?lactated ringers bolus 1,000 mL (1,000 mLs Intravenous New Bag/Given 02/02/22 1304)  ?potassium chloride SA (KLOR-CON M) CR tablet 40 mEq (40 mEq Oral Given 02/02/22 1358)   ? ? ? ?IMPRESSION / MDM / ASSESSMENT AND PLAN / ED COURSE  ?I reviewed the triage vital signs and the nursing notes. ? ?51 year old diabetic female presents to the ED with a few days of generalized weakness, atypical chest discomfort.  Has a benign examination without evidence of neurologic or vascular deficits.  She is ambulatory and moving all extremities.  Blood work with mild hypokalemia and hyperglycemia without acidosis to suggest DKA.  CBC unremarkable and D-dimer is negative, making PE very unlikely.  Troponin is negative and EKG is nonischemic.  Her beta hydroxybutyric acid is marginally elevated, but I do not believe that she needs an insulin drip or treatment for DKA considering her overall clinical picture and work-up.  Second troponin and urinalysis is pending at the time of signout to oncoming provider.  She is feeling better after some  IV fluids and is going to undergo a p.o. challenge as well considering that she is hungry.  Anticipate she will be suitable for outpatient management. ? ?Clinical Course as of 02/02/22 1441  ?Tue Feb 02, 2022  ?1338 Reassessed.  Reports feeling a little bit better with the IV fluids. [DS]  ?1428 Reassessed.  Feeling better.  Requesting something to eat.  We discussed probably going home and she is agreeable. [DS]  ?  ?Clinical Course User Index ?[DS] Delton PrairieSmith, [place] Cohick, MD  ? ? ? ?FINAL CLINICAL IMPRESSION(S) / ED DIAGNOSES  ? ?Final diagnoses:  ?Hyperglycemia  ?Hypokalemia  ?Generalized weakness  ? ? ? ?Rx / DC Orders  ? ?ED Discharge Orders   ? ? None  ? ?  ? ? ? ?Note:  This document was prepared using Dragon voice recognition software and may include unintentional dictation errors. ?  ?Delton PrairieSmith, Rosalynn Sergent, MD ?02/02/22 1443 ? ?

## 2022-02-02 NOTE — ED Triage Notes (Signed)
Pt sent from doctor's office in [place] for weakness, dizziness, chest pain, and shortness of breath. Pt with fall on Sunday with bilateral knee pain. Pt reports feeling weak and dizzy since fall, denies hitting head, denies blood thinners. ?

## 2022-02-02 NOTE — ED Notes (Signed)
Patient returned to room via stretcher from CT and XR. No acute changes noted to pt condition. MD to bedside. ?

## 2022-02-02 NOTE — ED Notes (Signed)
Patient transported to CT via stretcher with CT tech. °

## 2022-02-02 NOTE — Patient Instructions (Signed)

## 2022-02-02 NOTE — Progress Notes (Signed)
? ?Established Patient Office Visit ? ?Subjective:  ?Patient ID: Courtney Stafford, female    DOB: [birth year]  Age: 51 y.o. MRN: [id number] ? ?CC:  ?Chief Complaint  ?Patient presents with  ? Follow-up  ? Hypertension  ? Diabetes  ? Fall  ?  DOA 01/31/22. Patient c/o bilateral knee pain. Patient states she doesn't know what happened, but feels really bad.  ? ? ?HPI ?Courtney Stafford is a 51 y/o female who has history of T1DM, Hypertension, Stroke, presents for c/o generalized weakness, states that she's not feeling good. She c/o shortness of breath, lower extremity weakness, difficulty walking, ambulates with a walker, intermittent left chest pain, chest tightness, dizziness, and admits that her memory is foggy unable to remember things. She states that these symptoms have been going on few days prior to her fall and presently. She reports experiencing a fall 2 days ago in her living room . She reports having syncopal episode before the fall. She reports bruising her left buttocks and bilateral knee, denies hitting her head. She states that her fasting blood glucose was 180 mg/dl and it was 285 mg/dl when checked during visit.  Her blood pressure was elevated at 168/110 states that she has not taking her medications prior to office visit. She offers no further complaint. ? ?Past Medical History:  ?Diagnosis Date  ? Diabetes mellitus without complication ([place])   ? Hypertension   ? Stroke [facility]) 07/31/2020  ? ? ?Past Surgical History:  ?Procedure Laterality Date  ? CARPAL TUNNEL RELEASE  6 years ago  ? both hands  ? ? ?Family History  ?Problem Relation Age of Onset  ? Other Mother   ?     unknown medical history  ? Cancer Father   ? Diabetes Maternal Grandmother   ? Breast cancer Neg Hx   ? ? ?Social History  ? ?Socioeconomic History  ? Marital status: Single  ?  Spouse name: Not on file  ? Number of children: 2  ? Years of education: Not on file  ? Highest education level: High school graduate  ?Occupational History  ?  Occupation: unemployed  ?Tobacco Use  ? Smoking status: Some Days  ?  Packs/day: 0.01  ?  Types: Cigarettes  ? Smokeless tobacco: Never  ? Tobacco comments:  ?  occasional  ?Vaping Use  ? Vaping Use: Never used  ?Substance and Sexual Activity  ? Alcohol use: No  ? Drug use: No  ? Sexual activity: Not on file  ?Other Topics Concern  ? Not on file  ?Social History Narrative  ? Completed biopsychosocial assessment, social determinants screening, administered PHQ 9, and GAD 7.   ?   ? Social determinants screening completed 01/31/2020. HS She [facility] for [facility]. She receives food stamps 192 per month and utilizes a Building surveyor when needed. [employer] pays for housing and utilities.  She follows up with therapist at clinic for stress, anxiety, and depression. HS  ?   ? Support system is small.  ? ?Social Determinants of Health  ? ?Financial Resource Strain: Medium Risk  ? Difficulty of Paying Living Expenses: Somewhat hard  ?Food Insecurity: No Food Insecurity  ? Worried About Charity fundraiser in the Last Year: Never true  ? Ran Out of Food in the Last Year: Never true  ?Transportation Needs: Unmet Transportation Needs  ? Lack of Transportation (Medical): Yes  ? Lack of Transportation (Non-Medical): Yes  ?Physical Activity: Inactive  ?  Days of Exercise per Week: 0 days  ? Minutes of Exercise per Session: 0 min  ?Stress: Not on file  ?Social Connections: Socially Isolated  ? Frequency of Communication with Friends and Family: Once a week  ? Frequency of Social Gatherings with Friends and Family: Once a week  ? Attends Religious Services: Never  ? Active Member of Clubs or Organizations: Yes  ? Attends Archivist Meetings: Never  ? Marital Status: Separated  ?Intimate Partner Violence: Not At Risk  ? Fear of Current or Ex-Partner: No  ? Emotionally Abused: No  ? Physically Abused: No  ? Sexually Abused: No  ? ? ?Outpatient Medications Prior to Visit   ?Medication Sig Dispense Refill  ? amLODipine (NORVASC) 5 MG tablet TAKE 2 TABLETS (10MG  TOTAL) BY MOUTH ONCE DAILY. 180 tablet 1  ? aspirin 81 MG EC tablet Take 1 tablet (81 mg total) by mouth once daily. 90 tablet 4  ? atorvastatin (LIPITOR) 80 MG tablet TAKE ONE TABLET BY MOUTH EVERY DAY 90 tablet 3  ? Blood Glucose Monitoring Suppl Supplies MISC 1 each by Does not apply route 5 (five) times daily. (Patient taking differently: 1 each by Does not apply route. Check 3 to 4 times daily) 500 each 4  ? carvedilol (COREG) 12.5 MG tablet TAKE ONE TABLET BY MOUTH 2 TIMES A DAY WITH A MEAL 180 tablet 1  ? gabapentin (NEURONTIN) 300 MG capsule TAKE 2 CAPSULES (600MG ) BY MOUTH 2 TIMES A DAY AND 3 CAPSULES (900MG ) AT NIGHT 210 capsule 1  ? glucose blood (RIGHTEST GS550 BLOOD GLUCOSE) test strip USE TO TEST BLOOD GLUCOSE UP TO 4 TIMES PER DAY.  (IN THE MORNING AND AFTER MEALS) 100 strip 11  ? hydrALAZINE (APRESOLINE) 25 MG tablet TAKE ONE TABLET BY MOUTH 2 TIMES A DAY 180 tablet 1  ? hydrochlorothiazide (HYDRODIURIL) 25 MG tablet TAKE ONE TABLET BY MOUTH EVERY DAY 90 tablet 1  ? insulin aspart (NOVOLOG FLEXPEN) 100 UNIT/ML FlexPen Inject 5 Units into the skin 3 times daily with meals. Add 1 unit if 200-250; 2 units 251-300; 3 units 301-350; 4 units 351-400; If over 400 take 10 units total and recheck in 2 hours and notify clinic or go to the ED. 75 mL 3  ? insulin glargine (LANTUS) 100 UNIT/ML Solostar Pen Inject 30 Units into the skin once daily at bedtime. 30 mL 4  ? Insulin Pen Needle (BD PEN NEEDLE NANO U/F) 32G X 4 MM MISC 5 times daily (Patient taking differently: 3 to 4 times daily) 200 each 12  ? Insulin Pen Needle (NOVOFINE PEN NEEDLE) 32G X 6 MM MISC USE AS DIRECTED. 400 each PRN  ? lisinopril (ZESTRIL) 40 MG tablet TAKE ONE TABLET BY MOUTH EVERY DAY 90 tablet 1  ? mirtazapine (REMERON) 15 MG tablet Take 1 tablet (15 mg total) by mouth once daily at bedtime. 30 tablet 0  ? Rightest GL300 Lancets MISC USE AS  DIRECTED 100 each 3  ? meloxicam (MOBIC) 7.5 MG tablet Take 1 tablet (7.5 mg total) by mouth once daily as needed for pain. 30 tablet 1  ? ?No facility-administered medications prior to visit.  ? ? ?Allergies  ?Allergen Reactions  ? Phenergan [Promethazine] Hives  ? ? ?ROS ?Review of Systems  ?Constitutional:  Positive for fatigue.  ?Respiratory:  Positive for chest tightness and shortness of breath.   ?Cardiovascular:  Positive for chest pain.  ?Skin: Negative.   ?Neurological:  Positive for light-headedness.  ? ?  ?  Objective:  ?  ?Physical Exam ?HENT:  ?   Head: Normocephalic.  ?   Mouth/Throat:  ?   Mouth: Mucous membranes are moist.  ?Eyes:  ?   Extraocular Movements: Extraocular movements intact.  ?   Conjunctiva/sclera: Conjunctivae normal.  ?   Pupils: Pupils are equal, round, and reactive to light.  ?Cardiovascular:  ?   Rate and Rhythm: Normal rate and regular rhythm.  ?   Pulses: Normal pulses.  ?   Heart sounds: Normal heart sounds.  ?Pulmonary:  ?   Effort: Pulmonary effort is normal.  ?   Breath sounds: Normal breath sounds.  ?Neurological:  ?   Mental Status: She is alert and oriented to person, place, and time.  ?   Cranial Nerves: Cranial nerves 2-12 are intact.  ?   Motor: Weakness (bilateral hand grip was weak) present.  ? ? ?BP (!) 168/110 (BP Location: Right Arm, Patient Position: Sitting, Cuff Size: Large)   Pulse 90   Temp 97.8 ?F (36.6 ?C) (Oral)   Resp 17   Ht 5' (1.524 m)   Wt 200 lb 8 oz (90.9 kg)   LMP 01/03/2016 (Within Years)   SpO2 96%   BMI 39.16 kg/m?  ?Wt Readings from Last 3 Encounters:  ?02/02/22 200 lb 8 oz (90.9 kg)  ?12/15/21 200 lb 9.6 oz (91 kg)  ?08/19/21 192 lb 8 oz (87.3 kg)  ? ?Encouraged weight loss ? ?Health Maintenance Due  ?Topic Date Due  ? COVID-19 Vaccine (1) Never done  ? HIV Screening  Never done  ? Hepatitis C Screening  Never done  ? TETANUS/TDAP  Never done  ? Zoster Vaccines- Shingrix (1 of 2) Never done  ? COLONOSCOPY (Pts 45-51yrs Insurance coverage  will need to be confirmed)  Never done  ? OPHTHALMOLOGY EXAM  03/23/2019  ? MAMMOGRAM  09/18/2021  ? ? ?There are no preventive care reminders to display for this patient. ? ?Lab Results  ?Component Value Date  ? TSH 0.951 09/

## 2022-02-03 ENCOUNTER — Ambulatory Visit: Payer: Self-pay | Admitting: Licensed Clinical Social Worker

## 2022-02-03 DIAGNOSIS — F411 Generalized anxiety disorder: Secondary | ICD-10-CM

## 2022-02-03 DIAGNOSIS — F4310 Post-traumatic stress disorder, unspecified: Secondary | ICD-10-CM

## 2022-02-03 DIAGNOSIS — F331 Major depressive disorder, recurrent, moderate: Secondary | ICD-10-CM

## 2022-02-03 NOTE — BH Specialist Note (Signed)
Integrated Behavioral Health via Telemedicine Visit ? ?02/03/2022 ?Courtney Stafford ?[id number] ? ?Number of [place] Clinician visits: No data recorded ?Session Start time: No data recorded  ?Session End time: No data recorded ?Total time in minutes: No data recorded ? ?Referring Provider: [name], NP  ?Patient/Family location: The patient's home address ?[facility] Provider location: [facility] [place] ?All persons participating in visit: Courtney Stafford and Courtney Stafford, MSW, LCSW-A ?Types of Service: Telephone visit ? ?I connected with Courtney Stafford Telephone or Video Enabled Telemedicine Application  (Video is Caregility application) and verified that I am speaking with the correct person using two identifiers. Discussed confidentiality: Yes  ? ?I discussed the limitations of telemedicine and the availability of in person appointments.  Discussed there is a possibility of technology failure and discussed alternative modes of communication if that failure occurs. ? ?Patient and/or legal guardian expressed understanding and consented to Telemedicine visit: Yes  ? ?Presenting Concerns: ?Patient and/or family reports the following symptoms/concerns: The patient reports that she has been doing worse since her last follow-up appointment. She explained yesterday, she came to see her primary care provider for a check, but she was out of breath and experiencing chest pains, so her provider told her to go to the emergency department. The patient shared her history of stroke and noted that yesterday felt different. She explained that she was told to ask for a taxi voucher when she went to the ED to get a ride home. However, [name] explained that when she requested a taxi voucher, the hospital staff said they did not have a voucher program, and she would have to find her way home. The patient noted that she became very anxious about not getting home, and after many hours, she was able  to reach her daughter, who lives out of town, to pick her up. The patient noted typically; her daughter would not have been able to come to get her. The patient explained that she had recently been struggling to swallow pills and was unsure what was wrong with her. She stated that her CT scan and other tests came back fine. Courtney Stafford discussed other financial stressors impacting her life. She noted that she had not heard back from her charity care application and worries about medical bills daily. The patient said she would call [employer] to arrange a free ride to the [facility] for her follow-up with her primary care provider in a couple of weeks. She shared that [employer] will start charging in June, and she will be unable to pay. Courtney Stafford denied any suicidal or homicidal thoughts.  ?Duration of problem: Years; Severity of problem: moderate ? ?Patient and/or Family's Strengths/Protective Factors: ?Concrete supports in place (healthy food, safe environments, etc.) ? ?Goals Addressed: ?Patient will: ? Reduce symptoms of: agitation, anxiety, depression, insomnia, and stress  ? Increase knowledge and/or ability of: coping skills, healthy habits, self-management skills, and stress reduction  ? Demonstrate ability to: Increase healthy adjustment to current life circumstances and Increase adequate support systems for patient/family ? ?Progress towards Goals: ?Ongoing ? ?Interventions: ?Interventions utilized:  CBT Cognitive Behavioral Therapywas utilized by the clinician during today's follow up session. Clinician met with patient to identify needs related to stressors and functioning, and assess and monitor for signs and symptoms of anxiety and depression, and assess safety. The clinician processed with the patient how they have been doing since the last follow-up session. Clinician measured the patient's anxiety and depression on  numerical  scale. Clinician validated and encouraged the client to share their feelings toward  their recent health stressors and assisted the patient to label those feelings and develop a willingness to tolerate them. Clinician reinforced the patient's use of positive self talk. Clinician offered to speak to the patient's primary care provider regarding her health concerns. The session ended with scheduling.  ?Standardized Assessments completed: GAD-7 and PHQ 9 ?GAD-7= 13 ?PHQ-9 = 16 ? ? ?Assessment: ?Patient currently experiencing see above.  ? ?Patient may benefit from see above. ? ?Plan: ?Follow up with behavioral health clinician on : 02/10/2022 at 3:00 PM  ?Behavioral recommendations:  ?Referral(s): [place] (In Clinic) ? ?I discussed the assessment and treatment plan with the patient and/or parent/guardian. They were provided an opportunity to ask questions and all were answered. They agreed with the plan and demonstrated an understanding of the instructions. ?  ?They were advised to call back or seek an in-person evaluation if the symptoms worsen or if the condition fails to improve as anticipated. ? ?Lesli Albee, LCSWA ?

## 2022-02-09 ENCOUNTER — Other Ambulatory Visit: Payer: Self-pay

## 2022-02-10 ENCOUNTER — Ambulatory Visit: Payer: Self-pay | Admitting: Licensed Clinical Social Worker

## 2022-02-10 DIAGNOSIS — F4310 Post-traumatic stress disorder, unspecified: Secondary | ICD-10-CM

## 2022-02-10 DIAGNOSIS — F331 Major depressive disorder, recurrent, moderate: Secondary | ICD-10-CM

## 2022-02-10 DIAGNOSIS — F411 Generalized anxiety disorder: Secondary | ICD-10-CM

## 2022-02-10 NOTE — BH Specialist Note (Signed)
Integrated Behavioral Health via Telemedicine Visit ? ?02/10/2022 ?KAYLE PASSARELLI ?[id number] ? ? ?Referring Provider: [name], NP ?Patient/Family location: The patient's home ?[facility] Provider location: [facility] [place] ?All persons participating in visit: EYVA CALIFANO and [name], LCSW-A ?Types of Service: Telephone visit ? ?I connected with [name] via Telephone or Video Enabled Telemedicine Application  (Video is Caregility application) and verified that I am speaking with the correct person using two identifiers. Discussed confidentiality: Yes  ? ?I discussed the limitations of telemedicine and the availability of in person appointments.  Discussed there is a possibility of technology failure and discussed alternative modes of communication if that failure occurs. ? ?Patient and/or legal guardian expressed understanding and consented to Telemedicine visit: Yes  ? ?Presenting Concerns: ?Patient and/or family reports the following symptoms/concerns: The patient reports that she has been doing worse since her last follow-up appointment. She shared that after her last fall she has to use her walker again. She explained that she feels unsteady on her feet still. Senya noted that she struggles to cook and take care of her daily living needs. She stated that she mainly heats up meals  in her microwave and relies on others to bring her food from food banks and meals on wheels. She shared that [facility] told her they will not bring her to her follow-up appointment with her primary care provider because [place]. However, the patient shared that she can not walk that far or cross a busy 4 lane road to get to the bus stop. She noted that she has spent most of her day worrying how she can get supplies, medication, food, and to her medical appointments. She stated that she has no support system and her only family lives out of town. She noted that her  daughter work in [place] but is not able to help her. Emmanuella denied any suicidal or homicidal thoughts.  ?Duration of problem: Years; Severity of problem: moderate ? ?Patient and/or Family's Strengths/Protective Factors: ?Concrete supports in place (healthy food, safe environments, etc.) ? ?Goals Addressed: ?Patient will: ? Reduce symptoms of: agitation, anxiety, depression, insomnia, and stress  ? Increase knowledge and/or ability of: coping skills, healthy habits, self-management skills, and stress reduction  ? Demonstrate ability to: Increase healthy adjustment to current life circumstances and Increase adequate support systems for patient/family ? ?Progress towards Goals: ?Ongoing ? ?Interventions: ?Interventions utilized:  CBT Cognitive Behavioral Therapywas utilized by the clinician during today's follow up session. Clinician met with patient to identify needs related to stressors and functioning, and assess and monitor for signs and symptoms of anxiety and depression, and assess safety. The clinician processed with the patient how they have been doing since the last follow-up session.  Clinician intervened with positive regard and optimism to validate client's emotions, and supported client in exploring ways to connect with community resources to address her [facility] needs. The clinician encouraged the patient to utilize their coping skills to deal with their current life circumstances. The session ended with scheduling.   ?Standardized Assessments completed: GAD-7 and PHQ 9 ?GAD-7=16 ?PHQ-9=15 ? ? ?Assessment: ?Patient currently experiencing see above.  ? ?Patient may benefit from see above. ? ?Plan: ?Follow up with behavioral health clinician on : 02/23/2022 at 4:00 PM  ?Behavioral recommendations:  ?Referral(s): [facility] (In Clinic) ? ?I discussed the assessment and treatment plan with the patient and/or parent/guardian. They were provided an opportunity to ask  questions and all  were answered. They agreed with the plan and demonstrated an understanding of the instructions. ?  ?They were advised to call back or seek an in-person evaluation if the symptoms worsen or if the condition fails to improve as anticipated. ? ?Lesli Albee, LCSWA ?

## 2022-02-11 ENCOUNTER — Telehealth: Payer: Self-pay | Admitting: Licensed Clinical Social Worker

## 2022-02-11 NOTE — Telephone Encounter (Signed)
Returned the patient's call; answered her questions regarding transportation; offered to change her appointment on Tuesday 4/4 at 10:00 AM with her provider Hurman Horn, NP to a virtual appointment.  ?

## 2022-02-16 ENCOUNTER — Other Ambulatory Visit: Payer: Self-pay

## 2022-02-16 ENCOUNTER — Ambulatory Visit: Payer: Self-pay | Admitting: Gerontology

## 2022-02-16 DIAGNOSIS — E109 Type 1 diabetes mellitus without complications: Secondary | ICD-10-CM

## 2022-02-16 DIAGNOSIS — I10 Essential (primary) hypertension: Secondary | ICD-10-CM

## 2022-02-16 MED ORDER — LISINOPRIL 40 MG PO TABS
ORAL_TABLET | Freq: Every day | ORAL | 1 refills | Status: DC
  Filled 2022-02-16: qty 90, fill #0
  Filled 2022-04-06: qty 90, 90d supply, fill #0
  Filled 2022-07-13: qty 90, 90d supply, fill #1

## 2022-02-16 MED ORDER — HYDROCHLOROTHIAZIDE 25 MG PO TABS
ORAL_TABLET | Freq: Every day | ORAL | 1 refills | Status: DC
  Filled 2022-02-16: qty 90, fill #0
  Filled 2022-04-06: qty 90, 90d supply, fill #0
  Filled 2022-07-13: qty 90, 90d supply, fill #1

## 2022-02-16 NOTE — Patient Instructions (Signed)
Carbohydrate Counting for Diabetes Mellitus, Adult ?Carbohydrate counting is a method of keeping track of how many carbohydrates you eat. Eating carbohydrates increases the amount of sugar (glucose) in the blood. Counting how many carbohydrates you eat improves how well you manage your blood glucose. This, in turn, helps you manage your diabetes. ?Carbohydrates are measured in grams (g) per serving. It is important to know how many carbohydrates (in grams or by serving size) you can have in each meal. This is different for every person. A dietitian can help you make a meal plan and calculate how many carbohydrates you should have at each meal and snack. ?What foods contain carbohydrates? ?Carbohydrates are found in the following foods: ?Grains, such as breads and cereals. ?Dried beans and soy products. ?Starchy vegetables, such as potatoes, peas, and corn. ?Fruit and fruit juices. ?Milk and yogurt. ?Sweets and snack foods, such as cake, cookies, candy, chips, and soft drinks. ?How do I count carbohydrates in foods? ?There are two ways to count carbohydrates in food. You can read food labels or learn standard serving sizes of foods. You can use either of these methods or a combination of both. ?Using the Nutrition Facts label ?The Nutrition Facts list is included on the labels of almost all packaged foods and beverages in the [place]. It includes: ?The serving size. ?Information about nutrients in each serving, including the grams of carbohydrate per serving. ?To use the Nutrition Facts, decide how many servings you will have. Then, multiply the number of servings by the number of carbohydrates per serving. The resulting number is the total grams of carbohydrates that you will be having. ?Learning the standard serving sizes of foods ?When you eat carbohydrate foods that are not packaged or do not include Nutrition Facts on the label, you need to measure the servings in order to count the grams of  carbohydrates. ?Measure the foods that you will eat with a food scale or measuring cup, if needed. ?Decide how many standard-size servings you will eat. ?Multiply the number of servings by 15. For foods that contain carbohydrates, one serving equals 15 g of carbohydrates. ?For example, if you eat 2 cups or 10 oz (300 g) of strawberries, you will have eaten 2 servings and 30 g of carbohydrates (2 servings x 15 g = 30 g). ?For foods that have more than one food mixed, such as soups and casseroles, you must count the carbohydrates in each food that is included. ?The following list contains standard serving sizes of common carbohydrate-rich foods. Each of these servings has about 15 g of carbohydrates: ?1 slice of bread. ?1 six-inch (15 cm) tortilla. ?? cup or 2 oz (53 g) cooked rice or pasta. ?? cup or 3 oz (85 g) cooked or canned, drained and rinsed beans or lentils. ?? cup or 3 oz (85 g) starchy vegetable, such as peas, corn, or squash. ?? cup or 4 oz (120 g) hot cereal. ?? cup or 3 oz (85 g) boiled or mashed potatoes, or ? or 3 oz (85 g) of a large baked potato. ?? cup or 4 fl oz (118 mL) fruit juice. ?1 cup or 8 fl oz (237 mL) milk. ?1 small or 4 oz (106 g) apple. ?? or 2 oz (63 g) of a medium banana. ?1 cup or 5 oz (150 g) strawberries. ?3 cups or 1 oz (28.3 g) popped popcorn. ?What is an example of carbohydrate counting? ?To calculate the grams of carbohydrates in this sample meal, follow the steps   shown below. ?Sample meal ?3 oz (85 g) chicken breast. ?? cup or 4 oz (106 g) brown rice. ?? cup or 3 oz (85 g) corn. ?1 cup or 8 fl oz (237 mL) milk. ?1 cup or 5 oz (150 g) strawberries with sugar-free whipped topping. ?Carbohydrate calculation ?Identify the foods that contain carbohydrates: ?Rice. ?Corn. ?Milk. ?Strawberries. ?Calculate how many servings you have of each food: ?2 servings rice. ?1 serving corn. ?1 serving milk. ?1 serving strawberries. ?Multiply each number of servings by 15 g: ?2 servings rice x 15  g = 30 g. ?1 serving corn x 15 g = 15 g. ?1 serving milk x 15 g = 15 g. ?1 serving strawberries x 15 g = 15 g. ?Add together all of the amounts to find the total grams of carbohydrates eaten: ?30 g + 15 g + 15 g + 15 g = 75 g of carbohydrates total. ?What are tips for following this plan? ?Shopping ?Develop a meal plan and then make a shopping list. ?Buy fresh and frozen vegetables, fresh and frozen fruit, dairy, eggs, beans, lentils, and whole grains. ?Look at food labels. Choose foods that have more fiber and less sugar. ?Avoid processed foods and foods with added sugars. ?Meal planning ?Aim to have the same number of grams of carbohydrates at each meal and for each snack time. ?Plan to have regular, balanced meals and snacks. ?Where to find more information ?American Diabetes Association: diabetes.org ?Centers for Disease Control and Prevention: cdc.gov ?Academy of Nutrition and Dietetics: eatright.org ?Association of Diabetes Care & Education Specialists: diabeteseducator.org ?Summary ?Carbohydrate counting is a method of keeping track of how many carbohydrates you eat. ?Eating carbohydrates increases the amount of sugar (glucose) in your blood. ?Counting how many carbohydrates you eat improves how well you manage your blood glucose. This helps you manage your diabetes. ?A dietitian can help you make a meal plan and calculate how many carbohydrates you should have at each meal and snack. ?This information is not intended to replace advice given to you by your health care provider. Make sure you discuss any questions you have with your health care provider. ?Document Revised: 06/04/2020 Document Reviewed: 06/04/2020 ?Elsevier Patient Education ? 2022 Elsevier Inc. ?DASH Eating Plan ?DASH stands for Dietary Approaches to Stop Hypertension. The DASH eating plan is a healthy eating plan that has been shown to: ?Reduce high blood pressure (hypertension). ?Reduce your risk for type 2 diabetes, heart disease, and  stroke. ?Help with weight loss. ?What are tips for following this plan? ?Reading food labels ?Check food labels for the amount of salt (sodium) per serving. Choose foods with less than 5 percent of the Daily Value of sodium. Generally, foods with less than 300 milligrams (mg) of sodium per serving fit into this eating plan. ?To find whole grains, look for the word "whole" as the first word in the ingredient list. ?Shopping ?Buy products labeled as "low-sodium" or "no salt added." ?Buy fresh foods. Avoid canned foods and pre-made or frozen meals. ?Cooking ?Avoid adding salt when cooking. Use salt-free seasonings or herbs instead of table salt or sea salt. Check with your health care provider or pharmacist before using salt substitutes. ?Do not fry foods. Cook foods using healthy methods such as baking, boiling, grilling, roasting, and broiling instead. ?Cook with heart-healthy oils, such as olive, canola, avocado, soybean, or sunflower oil. ?Meal planning ? ?Eat a balanced diet that includes: ?4 or more servings of fruits and 4 or more servings of vegetables each day.   Try to fill one-half of your plate with fruits and vegetables. ?6-8 servings of whole grains each day. ?Less than 6 oz (170 g) of lean meat, poultry, or fish each day. A 3-oz (85-g) serving of meat is about the same size as a deck of cards. One egg equals 1 oz (28 g). ?2-3 servings of low-fat dairy each day. One serving is 1 cup (237 mL). ?1 serving of nuts, seeds, or beans 5 times each week. ?2-3 servings of heart-healthy fats. Healthy fats called omega-3 fatty acids are found in foods such as walnuts, flaxseeds, fortified milks, and eggs. These fats are also found in cold-water fish, such as sardines, salmon, and mackerel. ?Limit how much you eat of: ?Canned or prepackaged foods. ?Food that is high in trans fat, such as some fried foods. ?Food that is high in saturated fat, such as fatty meat. ?Desserts and other sweets, sugary drinks, and other foods  with added sugar. ?Full-fat dairy products. ?Do not salt foods before eating. ?Do not eat more than 4 egg yolks a week. ?Try to eat at least 2 vegetarian meals a week. ?Eat more home-cooked food and less restaurant, bu

## 2022-02-16 NOTE — Progress Notes (Signed)
? ?Established Patient Office Visit ? ?Subjective:  ?Patient ID: Courtney Stafford, female    DOB: Nov 02, 1971  Age: 51 y.o. MRN: [id number] ? ?CC: No chief complaint on file. ? ? ?HPI ?Courtney Stafford  is a 51 y/o female who has history of T1DM, Hypertension, Stroke,presents for follow up visit. She was seen at the ED for weakness on 02/02/22.  She checks her blood pressure this morning and it was 182/117 prior to taking her antihypertensive medication. She denies chest pain, palpitation, headache, light headedness and vision changes.  Her HgbA1c done on 12/15/21 was  7.4 %, she checks her blood glucose tid, ad her fasting reading was 83 mg/dl. She denies hypo-/hyperglycemic symptoms, peripheral neuropathy and performs daily foot check. She states that she's experiences difficulty walking to the bus stop, and walks with a walker.  She is concerned about coming to the clinic for appointments.  Overall, she states that she is doing well and offers no further complaint. ? ?Past Medical History:  ?Diagnosis Date  ? Diabetes mellitus without complication ([place])   ? Hypertension   ? Stroke [facility]) 07/31/2020  ? ? ?Past Surgical History:  ?Procedure Laterality Date  ? CARPAL TUNNEL RELEASE  6 years ago  ? both hands  ? ? ?Family History  ?Problem Relation Age of Onset  ? Other Mother   ?     unknown medical history  ? Cancer Father   ? Diabetes Maternal Grandmother   ? Breast cancer Neg Hx   ? ? ?Social History  ? ?Socioeconomic History  ? Marital status: Single  ?  Spouse name: Not on file  ? Number of children: 2  ? Years of education: Not on file  ? Highest education level: High school graduate  ?Occupational History  ? Occupation: unemployed  ?Tobacco Use  ? Smoking status: Some Days  ?  Packs/day: 0.01  ?  Types: Cigarettes  ? Smokeless tobacco: Never  ? Tobacco comments:  ?  occasional  ?Vaping Use  ? Vaping Use: Never used  ?Substance and Sexual Activity  ? Alcohol use: No  ? Drug use: No  ? Sexual activity: Not on file   ?Other Topics Concern  ? Not on file  ?Social History Narrative  ? Completed biopsychosocial assessment, social determinants screening, administered PHQ 9, and GAD 7.   ?   ? Social determinants screening completed 01/31/2020. HS She [facility] for [facility]. She receives food stamps 192 per month and utilizes a Building surveyor when needed. [employer] pays for housing and utilities.  She follows up with therapist at clinic for stress, anxiety, and depression. HS  ?   ? Support system is small.  ? ?Social Determinants of Health  ? ?Financial Resource Strain: Medium Risk  ? Difficulty of Paying Living Expenses: Somewhat hard  ?Food Insecurity: No Food Insecurity  ? Worried About Charity fundraiser in the Last Year: Never true  ? Ran Out of Food in the Last Year: Never true  ?Transportation Needs: Unmet Transportation Needs  ? Lack of Transportation (Medical): Yes  ? Lack of Transportation (Non-Medical): Yes  ?Physical Activity: Inactive  ? Days of Exercise per Week: 0 days  ? Minutes of Exercise per Session: 0 min  ?Stress: Not on file  ?Social Connections: Socially Isolated  ? Frequency of Communication with Friends and Family: Once a week  ? Frequency of Social Gatherings with Friends and Family: Once a week  ?  Attends Religious Services: Never  ? Active Member of Clubs or Organizations: Yes  ? Attends Archivist Meetings: Never  ? Marital Status: Separated  ?Intimate Partner Violence: Not At Risk  ? Fear of Current or Ex-Partner: No  ? Emotionally Abused: No  ? Physically Abused: No  ? Sexually Abused: No  ? ? ?Outpatient Medications Prior to Visit  ?Medication Sig Dispense Refill  ? amLODipine (NORVASC) 5 MG tablet TAKE 2 TABLETS (10MG TOTAL) BY MOUTH ONCE DAILY. 180 tablet 1  ? aspirin 81 MG EC tablet Take 1 tablet (81 mg total) by mouth once daily. 90 tablet 4  ? atorvastatin (LIPITOR) 80 MG tablet TAKE ONE TABLET BY MOUTH EVERY DAY 90 tablet 3  ? Blood  Glucose Monitoring Suppl Supplies MISC 1 each by Does not apply route 5 (five) times daily. (Patient taking differently: 1 each by Does not apply route. Check 3 to 4 times daily) 500 each 4  ? carvedilol (COREG) 12.5 MG tablet TAKE ONE TABLET BY MOUTH 2 TIMES A DAY WITH A MEAL 180 tablet 1  ? gabapentin (NEURONTIN) 300 MG capsule TAKE 2 CAPSULES (600MG) BY MOUTH 2 TIMES A DAY AND 3 CAPSULES (900MG) AT NIGHT 210 capsule 1  ? glucose blood (RIGHTEST GS550 BLOOD GLUCOSE) test strip USE TO TEST BLOOD GLUCOSE UP TO 4 TIMES PER DAY.  (IN THE MORNING AND AFTER MEALS) 100 strip 11  ? hydrALAZINE (APRESOLINE) 25 MG tablet TAKE ONE TABLET BY MOUTH 2 TIMES A DAY 180 tablet 1  ? insulin aspart (NOVOLOG FLEXPEN) 100 UNIT/ML FlexPen Inject 5 Units into the skin 3 times daily with meals. Add 1 unit if 200-250; 2 units 251-300; 3 units 301-350; 4 units 351-400; If over 400 take 10 units total and recheck in 2 hours and notify clinic or go to the ED. 75 mL 3  ? insulin glargine (LANTUS) 100 UNIT/ML Solostar Pen Inject 30 Units into the skin once daily at bedtime. 30 mL 4  ? Insulin Pen Needle (BD PEN NEEDLE NANO U/F) 32G X 4 MM MISC 5 times daily (Patient taking differently: 3 to 4 times daily) 200 each 12  ? Insulin Pen Needle (NOVOFINE PEN NEEDLE) 32G X 6 MM MISC USE AS DIRECTED. 400 each PRN  ? mirtazapine (REMERON) 15 MG tablet Take 1 tablet (15 mg total) by mouth once daily at bedtime. 30 tablet 0  ? Rightest GL300 Lancets MISC USE AS DIRECTED 100 each 3  ? hydrochlorothiazide (HYDRODIURIL) 25 MG tablet TAKE ONE TABLET BY MOUTH EVERY DAY 90 tablet 1  ? lisinopril (ZESTRIL) 40 MG tablet TAKE ONE TABLET BY MOUTH EVERY DAY 90 tablet 1  ? ?No facility-administered medications prior to visit.  ? ? ?Allergies  ?Allergen Reactions  ? Phenergan [Promethazine] Hives  ? ? ?ROS ?Review of Systems  ?Constitutional: Negative.   ?Eyes: Negative.   ?Respiratory: Negative.    ?Cardiovascular: Negative.   ?Endocrine: Negative.   ?Skin:  Negative.   ?Neurological: Negative.   ?Psychiatric/Behavioral: Negative.    ? ?  ?Objective:  ?  ?Physical Exam ?Telephone visit ?LMP 01/03/2016 (Within Years)  ?Wt Readings from Last 3 Encounters:  ?02/02/22 200 lb (90.7 kg)  ?02/02/22 200 lb 8 oz (90.9 kg)  ?12/15/21 200 lb 9.6 oz (91 kg)  ? ? ? ?Health Maintenance Due  ?Topic Date Due  ? COVID-19 Vaccine (1) Never done  ? HIV Screening  Never done  ? Hepatitis C Screening  Never done  ? TETANUS/TDAP  Never done  ? Zoster Vaccines- Shingrix (1 of 2) Never done  ? COLONOSCOPY (Pts 45-51yr Insurance coverage will need to be confirmed)  Never done  ? OPHTHALMOLOGY EXAM  03/23/2019  ? MAMMOGRAM  09/18/2021  ? ? ?There are no preventive care reminders to display for this patient. ? ?Lab Results  ?Component Value Date  ? TSH 0.951 07/31/2020  ? ?Lab Results  ?Component Value Date  ? WBC 6.7 02/02/2022  ? HGB 15.5 (H) 02/02/2022  ? HCT 48.0 (H) 02/02/2022  ? MCV 86.0 02/02/2022  ? PLT 298 02/02/2022  ? ?Lab Results  ?Component Value Date  ? NA 135 02/02/2022  ? K 3.2 (L) 02/02/2022  ? CO2 30 02/02/2022  ? GLUCOSE 275 (H) 02/02/2022  ? BUN 10 02/02/2022  ? CREATININE 0.73 02/02/2022  ? BILITOT 0.6 02/02/2022  ? ALKPHOS 105 02/02/2022  ? AST 22 02/02/2022  ? ALT 19 02/02/2022  ? PROT 7.4 02/02/2022  ? ALBUMIN 3.8 02/02/2022  ? CALCIUM 9.1 02/02/2022  ? ANIONGAP 8 02/02/2022  ? EGFR 73 01/14/2021  ? ?Lab Results  ?Component Value Date  ? CHOL 218 (H) 07/31/2020  ? ?Lab Results  ?Component Value Date  ? HDL 78 07/31/2020  ? ?Lab Results  ?Component Value Date  ? LDLCALC 109 (H) 07/31/2020  ? ?Lab Results  ?Component Value Date  ? TRIG 157 (H) 07/31/2020  ? ?Lab Results  ?Component Value Date  ? CHOLHDL 2.8 07/31/2020  ? ?Lab Results  ?Component Value Date  ? HGBA1C 7.4 (A) 12/15/2021  ? ? ?  ?Assessment & Plan:  ? ? ? ?1. Essential hypertension ?-Per patient her blood pressure was not under control, she will continue on current medication, was advised to continue checking  her blood pressure, record and bring log to follow up appointment.  She was educated on stroke symptoms and advised to go to the emergency room. ?- hydrochlorothiazide (HYDRODIURIL) 25 MG tablet; TAKE ONE TABLET BY

## 2022-02-22 ENCOUNTER — Other Ambulatory Visit: Payer: Self-pay

## 2022-02-23 ENCOUNTER — Ambulatory Visit: Payer: Self-pay | Admitting: Licensed Clinical Social Worker

## 2022-02-23 ENCOUNTER — Telehealth: Payer: Self-pay | Admitting: Pharmacist

## 2022-02-23 ENCOUNTER — Other Ambulatory Visit: Payer: Self-pay

## 2022-02-23 ENCOUNTER — Other Ambulatory Visit: Payer: Self-pay | Admitting: Gerontology

## 2022-02-23 DIAGNOSIS — F4310 Post-traumatic stress disorder, unspecified: Secondary | ICD-10-CM

## 2022-02-23 DIAGNOSIS — F411 Generalized anxiety disorder: Secondary | ICD-10-CM

## 2022-02-23 DIAGNOSIS — G2581 Restless legs syndrome: Secondary | ICD-10-CM

## 2022-02-23 DIAGNOSIS — F331 Major depressive disorder, recurrent, moderate: Secondary | ICD-10-CM

## 2022-02-23 MED FILL — Glucose Blood Test Strip: 50 days supply | Qty: 200 | Fill #2 | Status: AC

## 2022-02-23 NOTE — Telephone Encounter (Signed)
Pt called to arrange transportation. She has had a stroke and not able to walk to the [place] transit pickup. She struggles with medication compliance due to lack of transportation. We are working on filling her medications and supplies in 90-day increments. ? ?Jarreau Callanan K. Joelene Millin, PharmD ?[facility] ?Clinic-Pharmacy Operations Coordinator ?[phone] ? ?

## 2022-02-23 NOTE — BH Specialist Note (Signed)
Integrated Behavioral Health via Telemedicine Visit ? ?02/23/2022 ?Courtney Stafford ?[id number] ? ?Number of [place] Clinician visits: No data recorded ?Session Start time: No data recorded  ?Session End time: No data recorded ?Total time in minutes: No data recorded ? ?Referring Provider: [name], Np ?Patient/Family location: The patients address ?[facility] Provider location: Remote in [place], [place] ?All persons participating in visit: Ricardo Kayes. [place] and [employer], LCSW-A ?Types of Service: Telephone visit ? ?I connected with Courtney Stafford via Telephone or Video Enabled Telemedicine Application  (Video is Caregility application) and verified that I am speaking with the correct person using two identifiers. Discussed confidentiality: Yes  ? ?I discussed the limitations of telemedicine and the availability of in person appointments.  Discussed there is a possibility of technology failure and discussed alternative modes of communication if that failure occurs. ? ?Patient and/or legal guardian expressed understanding and consented to Telemedicine visit: Yes  ? ?Presenting Concerns: ?Patient and/or family reports the following symptoms/concerns: The patient reported that she has been doing okay since her last follow-up appointment. She shared that she has good days and bad days. Courtney Stafford explained that on her bad days she prefers to be alone and not talk to anyone. She shared that her bad days typically are when she feels down or misses her late son a lot or if she is sick and in pain. Courtney Stafford noted that her good days are days when she has everything she needs and she is not hurting. The patient discussed not having transportation and the impact that will have on her life. She shared that she worries bout how she is going to get to the store, her doctors appointment, and to the pharmacy to pick up her medications. She noted that she can not ask her daughter to help her because she works  and has a family to take care of. The patient noted that she has until April 28 th to turn in her paperwork to be re -certified for care at [facility], but does not have a way to make copies or to send the information in. [name] denied any suicidal or homicidal thoughts.  ?Duration of problem: Years; Severity of problem: moderate ? ?Patient and/or Family's Strengths/Protective Factors: ?Concrete supports in place (healthy food, safe environments, etc.) and Sense of purpose ? ?Goals Addressed: ?Patient will: ? Reduce symptoms of: agitation, anxiety, depression, and stress  ? Increase knowledge and/or ability of: coping skills, healthy habits, self-management skills, and stress reduction  ? Demonstrate ability to: Increase healthy adjustment to current life circumstances and Increase adequate support systems for patient/family ? ?Progress towards Goals: ?Ongoing ? ?Interventions: ?Interventions utilized:  CBT Cognitive Behavioral Therapywas utilized by the clinician during today's follow up session. Clinician met with patient to identify needs related to stressors and functioning, and assess and monitor for signs and symptoms of anxiety and depression, and assess safety. The clinician processed with the patient how they have been doing since the last follow-up session. Clinician encouraged the patient to continue to work on calming techniques (eg. paced breathing, deep muscle relaxation, and calming imagery) as a strategy for responding appropriately to anxiety and the urge to avoid situations or self-isolate when they occur and move towards increasing the patients self regulation. Clinician explained to the patient that she was checking on the transportation policy changes recently enacted and would reach out when she had more information.The session ended with scheduling.   ?Standardized Assessments completed: GAD-7 and PHQ 9 ?  GAD-7= 13 ?PHQ-9= 12 ? ?Assessment: ?Patient currently experiencing See above.   ? ?Patient may benefit from See Above. ? ?Plan: ?Follow up with behavioral health clinician on : 03/03/2022 at 11:00 am ?Behavioral recommendations:  ?Referral(s): [place] (In Clinic) ? ?I discussed the assessment and treatment plan with the patient and/or parent/guardian. They were provided an opportunity to ask questions and all were answered. They agreed with the plan and demonstrated an understanding of the instructions. ?  ?They were advised to call back or seek an in-person evaluation if the symptoms worsen or if the condition fails to improve as anticipated. ? ?Lesli Albee, LCSWA ?

## 2022-02-24 ENCOUNTER — Other Ambulatory Visit: Payer: Self-pay | Admitting: Gerontology

## 2022-02-24 ENCOUNTER — Other Ambulatory Visit: Payer: Self-pay

## 2022-02-24 DIAGNOSIS — F411 Generalized anxiety disorder: Secondary | ICD-10-CM

## 2022-02-24 MED ORDER — MIRTAZAPINE 15 MG PO TABS
15.0000 mg | ORAL_TABLET | Freq: Every day | ORAL | 0 refills | Status: DC
  Filled 2022-02-24: qty 90, 90d supply, fill #0

## 2022-02-24 MED ORDER — GABAPENTIN 300 MG PO CAPS
ORAL_CAPSULE | ORAL | 1 refills | Status: DC
  Filled 2022-02-24: qty 210, 30d supply, fill #0
  Filled 2022-03-26: qty 210, 30d supply, fill #1

## 2022-02-24 MED ORDER — MIRTAZAPINE 15 MG PO TABS
15.0000 mg | ORAL_TABLET | Freq: Every day | ORAL | 0 refills | Status: DC
  Filled 2022-02-24: qty 30, 30d supply, fill #0

## 2022-03-03 ENCOUNTER — Ambulatory Visit: Payer: Self-pay | Admitting: Licensed Clinical Social Worker

## 2022-03-10 ENCOUNTER — Ambulatory Visit: Payer: Self-pay | Admitting: Licensed Clinical Social Worker

## 2022-03-10 DIAGNOSIS — F331 Major depressive disorder, recurrent, moderate: Secondary | ICD-10-CM

## 2022-03-10 DIAGNOSIS — F4310 Post-traumatic stress disorder, unspecified: Secondary | ICD-10-CM

## 2022-03-10 DIAGNOSIS — F411 Generalized anxiety disorder: Secondary | ICD-10-CM

## 2022-03-10 NOTE — BH Specialist Note (Signed)
Integrated Behavioral Health via Telemedicine Visit  03/10/2022 Courtney Stafford [id number]   Referring Provider: [name], NP Patient/Family location: The patient's home  [facility] Provider location: [facility] [place] All persons participating in visit: Courtney Stafford. [place] and [employer], LCSW-A Types of Service: Telephone visit  I connected with Courtney Stafford via  Telephone or Video Enabled Telemedicine Application  (Video is Caregility application) and verified that I am speaking with the correct person using two identifiers. Discussed confidentiality: Yes   I discussed the limitations of telemedicine and the availability of in person appointments.  Discussed there is a possibility of technology failure and discussed alternative modes of communication if that failure occurs.  Patient and/or legal guardian expressed understanding and consented to Telemedicine visit: Yes   Presenting Concerns: Patient and/or family reports the following symptoms/concerns: The patient reports that she has been doing the same since her last follow-up visit.  Courtney Stafford discussed several situational stressors impacting her life currently.  She shared she struggles with self-care due to financial and mental health barriers.  Courtney Stafford shared on her difficult days she prefers to isolate in her home and distract herself by watching television and playing games on her phone.  She discussed feelings of loneliness and noted that it is difficult for her to reach out to others because she does not want to burden her.  The patient continued to express concerns regarding transportation to and from her medical appointments and pharmacy.  Courtney Stafford shared that she will be going to stay with her daughter to help watch her grandson most days of the week over the summer I would like her appointments to be scheduled for Thursday's.  The patient denied any suicidal or homicidal thoughts. Duration of problem: Years;  Severity of problem: moderate  Patient and/or Family's Strengths/Protective Factors: Concrete supports in place (healthy food, safe environments, etc.)  Goals Addressed: Patient will:  Reduce symptoms of: agitation, anxiety, depression, insomnia, and stress   Increase knowledge and/or ability of: coping skills, healthy habits, self-management skills, and stress reduction   Demonstrate ability to: Increase healthy adjustment to current life circumstances  Progress towards Goals: Ongoing  Interventions: Interventions utilized:  CBT Cognitive Behavioral Therapy and Link to [employer] utilized by the clinician during today's follow up session. Clinician met with patient to identify needs related to stressors and functioning, and assess and monitor for signs and symptoms of anxiety and depression, and assess safety. The clinician processed with the patient how they have been doing since the last follow-up session. Clinician provided a safe judgment free space for the patient to express their frustrations regarding their current life circumstances. Clinician encouraged the patient to use her coping skills to deal with her current life circumstances and  provided the patient with community resources such as [facility] and encouraged her to reach out for an earlier appointment should she need. The session ended with scheduling. Standardized Assessments completed:  Due to time constraints will complete at future follow-up.   Assessment: Patient currently experiencing see above.   Patient may benefit from see above.  Plan: Follow up with behavioral health clinician on : 03/18/2022 Behavioral recommendations:  Referral(s): [place] (In Clinic)  I discussed the assessment and treatment plan with the patient and/or parent/guardian. They were provided an opportunity to ask questions and all were answered. They agreed with the plan and demonstrated an  understanding of the instructions.   They were advised to call back or seek  an in-person evaluation if the symptoms worsen or if the condition fails to improve as anticipated.  Courtney Stafford, LCSWA

## 2022-03-11 ENCOUNTER — Telehealth: Payer: Self-pay | Admitting: Gerontology

## 2022-03-11 NOTE — Telephone Encounter (Signed)
Left message about whether patient would be willing to meet over the phone for 5/2 appointment. ?

## 2022-03-15 ENCOUNTER — Other Ambulatory Visit: Payer: Self-pay

## 2022-03-16 ENCOUNTER — Other Ambulatory Visit: Payer: Self-pay

## 2022-03-16 ENCOUNTER — Ambulatory Visit: Payer: Self-pay | Admitting: Gerontology

## 2022-03-16 DIAGNOSIS — E10618 Type 1 diabetes mellitus with other diabetic arthropathy: Secondary | ICD-10-CM

## 2022-03-16 DIAGNOSIS — I10 Essential (primary) hypertension: Secondary | ICD-10-CM

## 2022-03-16 NOTE — Progress Notes (Signed)
? ?Established Patient Office Visit ? ?Subjective   ?Patient ID: Courtney Stafford, female    DOB: 02-Apr-1971  Age: 51 y.o. MRN: [id number] ? ?No chief complaint on file. ?Patient consents to telephone visit, 2 patient identifiers was used to identify patient. ? ?HPI ? ?Courtney Stafford  is a 51 y/o female who has history of T1DM, Hypertension, Stroke,presents for follow up visit. She states that she's compliant with her medications, continues to make healthy lifestyle changes. She checks her blood pressure every other day, and it was 157/102. She denies headache, chest pain, palpitation, dizziness and vision changes. Her HgbA1c done on 12/15/21 was 7.4%, and unable to come to the clinic for her 3 months follow up visit because of transportation issue. She checks her blood glucose tid, states that her fasting reading this morning it was 174 mg/dl. She denies hypo/hyperglycemic symptoms, states that her peripheral neuropathy is under control with taking gabapentin, performs daily foot checks. She states that she tripped over an object 2 weeks ago and fell on her knees, but she pulled herself up. She denies any injury. Overall, she states that she's doing well and offers no further complaint. ? ? ?Review of Systems  ?Constitutional: Negative.   ?Eyes: Negative.   ?Respiratory: Negative.    ?Cardiovascular: Negative.   ?Neurological: Negative.   ?Psychiatric/Behavioral: Negative.    ? ?  ?Objective:  ?  ?Telephone visit. ? ?LMP 01/03/2016 (Within Years)  ?BP Readings from Last 3 Encounters:  ?02/02/22 (!) 213/188  ?02/02/22 (!) 168/110  ?12/15/21 (!) 176/121  ? ?Wt Readings from Last 3 Encounters:  ?02/02/22 200 lb (90.7 kg)  ?02/02/22 200 lb 8 oz (90.9 kg)  ?12/15/21 200 lb 9.6 oz (91 kg)  ? ?  ? ?Physical Exam ? ? ? ?No results found for any visits on 03/16/22. ? ?Last CBC ?Lab Results  ?Component Value Date  ? WBC 6.7 02/02/2022  ? HGB 15.5 (H) 02/02/2022  ? HCT 48.0 (H) 02/02/2022  ? MCV 86.0 02/02/2022  ? MCH 27.8  02/02/2022  ? RDW 13.7 02/02/2022  ? PLT 298 02/02/2022  ? ?Last metabolic panel ?Lab Results  ?Component Value Date  ? GLUCOSE 275 (H) 02/02/2022  ? NA 135 02/02/2022  ? K 3.2 (L) 02/02/2022  ? CL 97 (L) 02/02/2022  ? CO2 30 02/02/2022  ? BUN 10 02/02/2022  ? CREATININE 0.73 02/02/2022  ? GFRNONAA >60 02/02/2022  ? CALCIUM 9.1 02/02/2022  ? PHOS 3.6 11/12/2012  ? PROT 7.4 02/02/2022  ? ALBUMIN 3.8 02/02/2022  ? LABGLOB 2.9 01/14/2021  ? AGRATIO 1.6 01/14/2021  ? BILITOT 0.6 02/02/2022  ? ALKPHOS 105 02/02/2022  ? AST 22 02/02/2022  ? ALT 19 02/02/2022  ? ANIONGAP 8 02/02/2022  ? ?Last lipids ?Lab Results  ?Component Value Date  ? CHOL 218 (H) 07/31/2020  ? HDL 78 07/31/2020  ? LDLCALC 109 (H) 07/31/2020  ? TRIG 157 (H) 07/31/2020  ? CHOLHDL 2.8 07/31/2020  ? ?Last hemoglobin A1c ?Lab Results  ?Component Value Date  ? HGBA1C 7.4 (A) 12/15/2021  ? ?  ? ?The ASCVD Risk score (Arnett DK, et al., 2019) failed to calculate for the following reasons: ?  The patient has a prior MI or stroke diagnosis ? ?  ?Assessment & Plan:  ? ?1. Primary hypertension ?- Unable to check her blood pressure , she was advised to notify clinic for continues elevated readings of greater than 140/90. She was educated on the signs and symptoms of  Stroke and advised to go to the ED. She was encouraged to continue on DASH diet and exercise as tolerated. ? ?2. 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 She will continue on current medication , Low carb/non concentrated sweet diet and exercise as tolerated. ? ? ?Return in about 4 weeks (around 04/13/2022), or if symptoms worsen or fail to improve.  ? ? ?Tomiko Schoon Trellis Paganini, NP ? ?

## 2022-03-16 NOTE — Patient Instructions (Signed)

## 2022-03-18 ENCOUNTER — Ambulatory Visit: Payer: Self-pay | Admitting: Licensed Clinical Social Worker

## 2022-03-18 DIAGNOSIS — F4310 Post-traumatic stress disorder, unspecified: Secondary | ICD-10-CM

## 2022-03-18 DIAGNOSIS — F331 Major depressive disorder, recurrent, moderate: Secondary | ICD-10-CM

## 2022-03-18 NOTE — BH Specialist Note (Signed)
Integrated Behavioral Health via Telemedicine Visit  03/18/2022 Courtney Stafford [id number]   Referring Provider: Elizabeth Chioma, NP  Patient/Family location: The Patient's home  [facility] Provider location: [facility] All persons participating in visit: Sagrario M. [place] and Heather Pruitt, LCSW-A  Types of Service: Telephone visit  I connected with Courtney Stafford via  Telephone or Video Enabled Telemedicine Application  (Video is Caregility application) and verified that I am speaking with the correct person using two identifiers. Discussed confidentiality: Yes   I discussed the limitations of telemedicine and the availability of in person appointments.  Discussed there is a possibility of technology failure and discussed alternative modes of communication if that failure occurs.  Patient and/or legal guardian expressed understanding and consented to Telemedicine visit: Yes   Presenting Concerns: Patient and/or family reports the following symptoms/concerns: The patient report that she has been doing worse since her last follow-up appointment. She shared that she is upset that she has not been given transportation to and from her appointments with [facility] or other places she needs to go. She noted she does not understand why people are doing this now. She shared that she has been spending a lot of time thinking about her son and she tries to fight the feelings of grief. She noted watching TV and playing games on her phone helps sometimes and other times it doesn't. Courtney Stafford discussed other situational stressors impacting her life. She noted that she still has not received her glucose meter in the mail but is checking for it. The patient denied nay suicidal or homicidal thoughts.  Duration of problem: Years; Severity of problem: moderate  Patient and/or Family's Strengths/Protective Factors: Concrete supports in place (healthy food, safe environments,  etc.)  Goals Addressed: Patient will:  Reduce symptoms of: agitation, anxiety, depression, insomnia, and stress   Increase knowledge and/or ability of: coping skills, healthy habits, self-management skills, and stress reduction   Demonstrate ability to: Increase healthy adjustment to current life circumstances and Increase adequate support systems for patient/family  Progress towards Goals: Ongoing  Interventions: Interventions utilized:  CBT Cognitive Behavioral Therapywas utilized by the clinician during today's follow up session. Clinician met with patient to identify needs related to stressors and functioning, and assess and monitor for signs and symptoms of anxiety and depression, and assess safety. The clinician processed with the patient how they have been doing since the last follow-up session. Clinician measured the patients anxiety and depression on a numerical scale.Clinician introduced and guided the patient through a mindfulness mediation exercise as a tool to help the patient manage her anxiety. Clinician incorporated acceptance based coping skills to encourage the client to acknowledge and accept difficult thoughts and emotions verses struggling to fight against them. The session ended with scheduling.  Standardized Assessments completed: GAD-7 and PHQ 9 GAD-7= 17 PHQ-9= 14   Assessment: Patient currently experiencing see above.   Patient may benefit from see above.  Plan: Follow up with behavioral health clinician on : 03/25/2022 at 11:00 PM  Behavioral recommendations:  Referral(s): [facility] (In Clinic)  I discussed the assessment and treatment plan with the patient and/or parent/guardian. They were provided an opportunity to ask questions and all were answered. They agreed with the plan and demonstrated an understanding of the instructions.   They were advised to call back or seek an in-person evaluation if the symptoms worsen or if the  condition fails to improve as anticipated.  Heather J   Pruitt, LCSWA 

## 2022-03-19 ENCOUNTER — Other Ambulatory Visit: Payer: Self-pay

## 2022-03-25 ENCOUNTER — Ambulatory Visit: Payer: Self-pay | Admitting: Licensed Clinical Social Worker

## 2022-03-25 ENCOUNTER — Other Ambulatory Visit: Payer: Self-pay

## 2022-03-25 DIAGNOSIS — F331 Major depressive disorder, recurrent, moderate: Secondary | ICD-10-CM

## 2022-03-25 DIAGNOSIS — F4310 Post-traumatic stress disorder, unspecified: Secondary | ICD-10-CM

## 2022-03-25 DIAGNOSIS — F411 Generalized anxiety disorder: Secondary | ICD-10-CM

## 2022-03-25 NOTE — BH Specialist Note (Signed)
Integrated Behavioral Health via Telemedicine Visit  03/25/2022 Courtney Stafford [id number]    Referring Provider: [name], NP Patient/Family location: The patient's address [facility] Provider location: [facility] [place] All persons participating in visit: Megyn Leng. [place] and [employer], LCSW-A Types of Service: Telephone visit  I connected with [name] via  Telephone or Video Enabled Telemedicine Application  (Video is Caregility application) and verified that I am speaking with the correct person using two identifiers. Discussed confidentiality: Yes   I discussed the limitations of telemedicine and the availability of in person appointments.  Discussed there is a possibility of technology failure and discussed alternative modes of communication if that failure occurs.  Patient and/or legal guardian expressed understanding and consented to Telemedicine visit: Yes   Presenting Concerns: Patient and/or family reports the following symptoms/concerns: The patient reports that she has been about the same since her last follow-up appointment. She shared that she is very concerned that she will not be able to get to her next medical appointment due to lack of transportation. She shared that she recently experienced another fall.  Jaislyn shared her frustrations with accessing her care and getting her medications. She discussed other financial, and  health stressors impacting her life. She noted that she is spending a great portion of her waking hours worrying about transportation. The patient shared details of her adult daughter's life and their relationship. She explained that she does not have family or friends that she can count on and she has to figure this out on her own. She stated that her only friend moved away a couple of years ago. She stated that her pastor's wife brings her meals once a week and that occasionally she will give her a ride to the store.  Vena denied any suicidal or homicidal thoughts.  Duration of problem: Years; Severity of problem: moderate  Patient and/or Family's Strengths/Protective Factors: Concrete supports in place (healthy food, safe environments, etc.) and Sense of purpose  Goals Addressed: Patient will:  Reduce symptoms of: agitation, anxiety, depression, insomnia, and stress   Increase knowledge and/or ability of: coping skills, healthy habits, self-management skills, and stress reduction   Demonstrate ability to: Increase healthy adjustment to current life circumstances  Progress towards Goals: Ongoing  Interventions: Interventions utilized:  CBT Cognitive Behavioral Therapywas utilized by the clinician during today's follow up session. Clinician met with patient to identify needs related to stressors and functioning, and assess and monitor for signs and symptoms of anxiety and depression, and assess safety. The clinician processed with the patient how they have been doing since the last follow-up session. Clinician measured the patient's anxiety and depression on a numerical scale. Clinician reassured the patient that she would contact her as soon as she received any information regarding the patient's transportation request. Clinician intervened with positive regard and optimism to validate client's emotions, and supported client in exploring ways to increase her support system, and encouraged her to reach out to those she does know for assistance. The clinician encouraged the patient to utilize their coping skills to deal with their current life circumstances. The session ended with scheduling.  Standardized Assessments completed: GAD-7 and PHQ 9 GAD-7=  17 PHQ-9 = 14  Assessment: Patient currently experiencing see above.   Patient may benefit from see above.  Plan: Follow up with behavioral health clinician on : 03/31/2022 at 1:00 AM  Behavioral recommendations:  Referral(s): Integrated [employer] (In Clinic)  I discussed the  assessment and treatment plan with the patient and/or parent/guardian. They were provided an opportunity to ask questions and all were answered. They agreed with the plan and demonstrated an understanding of the instructions.   They were advised to call back or seek an in-person evaluation if the symptoms worsen or if the condition fails to improve as anticipated.  Lesli Albee, LCSWA

## 2022-03-26 ENCOUNTER — Other Ambulatory Visit: Payer: Self-pay | Admitting: Pharmacist

## 2022-03-26 ENCOUNTER — Other Ambulatory Visit: Payer: Self-pay

## 2022-03-26 MED ORDER — RIGHTEST GL300 LANCETS MISC
3 refills | Status: DC
  Filled 2022-03-26 – 2022-05-19 (×2): qty 200, 50d supply, fill #0

## 2022-03-26 MED ORDER — RIGHTEST GM550 BLOOD GLUCOSE W/DEVICE KIT
1.0000 | PACK | 0 refills | Status: DC
  Filled 2022-03-26: qty 1, 30d supply, fill #0

## 2022-03-26 MED FILL — Glucose Blood Test Strip: 50 days supply | Qty: 200 | Fill #3 | Status: AC

## 2022-03-30 ENCOUNTER — Telehealth: Payer: Self-pay | Admitting: Licensed Clinical Social Worker

## 2022-03-30 NOTE — Telephone Encounter (Signed)
Called the patient to inform her that her request was approved and she will be receiving a new glucose meter and testing strips in the mail. Additionally, I informed the patient that her transportation request is being reviewed and I would be in contact when more information is available.  ?

## 2022-03-31 ENCOUNTER — Ambulatory Visit: Payer: Self-pay | Admitting: Licensed Clinical Social Worker

## 2022-03-31 ENCOUNTER — Telehealth: Payer: Self-pay | Admitting: Pharmacist

## 2022-03-31 NOTE — Telephone Encounter (Signed)
03/31/2022 2:30:52 PM - Lantus refill fax to [employer] ?-- Vonda Henderson - Wednesday, Mar 31, 2022 2:30 PM --Lantus refill faxed to [employer].  ?

## 2022-04-06 ENCOUNTER — Ambulatory Visit: Payer: Self-pay | Admitting: Licensed Clinical Social Worker

## 2022-04-06 ENCOUNTER — Other Ambulatory Visit: Payer: Self-pay

## 2022-04-06 DIAGNOSIS — F4310 Post-traumatic stress disorder, unspecified: Secondary | ICD-10-CM

## 2022-04-06 DIAGNOSIS — F331 Major depressive disorder, recurrent, moderate: Secondary | ICD-10-CM

## 2022-04-06 DIAGNOSIS — F411 Generalized anxiety disorder: Secondary | ICD-10-CM

## 2022-04-06 NOTE — BH Specialist Note (Signed)
Integrated Behavioral Health via Telemedicine Visit  04/06/2022 CIERRIA Stafford [id number]   Referring Provider: [name], NP  Patient/Family location: The patient's home address  [facility] Provider location: [facility] [place] All persons participating in visit: Courtney Stafford and Courtney Cairo, LCSW-A Types of Service: Telephone visit  I connected with Courtney Stafford via  Telephone or Video Enabled Telemedicine Application  (Video is Caregility application) and verified that I am speaking with the correct person using two identifiers. Discussed confidentiality: Yes   I discussed the limitations of telemedicine and the availability of in person appointments.  Discussed there is a possibility of technology failure and discussed alternative modes of communication if that failure occurs.  Patient and/or legal guardian expressed understanding and consented to Telemedicine visit: Yes   Presenting Concerns: Patient and/or family reports the following symptoms/concerns: The patient noted that she has been doing okay since her last follow-up session. She noted that she continues to struggle with loneliness. She noted some days are worse for her than others. She discussed health related stressors impacting her life. She explained that she is more afraid of falling down especially after waking up. She discussed her transportation barriers and expressed frustration of not being able to get to the store or to her appointments as needed. She stated that her daughter worked a full time job and had a family of her own so she did not want to be a bother to her. The patient shared that she may stay with her a few days this summer to watch her grandson and she was looking forward to that. Courtney Stafford denied any suicidal or homicidal thoughts. . Duration of problem: Years; Severity of problem: moderate  Patient and/or Family's Strengths/Protective Factors: Social connections, Concrete supports  in place (healthy food, safe environments, etc.), and Sense of purpose  Goals Addressed: Patient will:  Reduce symptoms of: agitation, anxiety, depression, and stress   Increase knowledge and/or ability of: coping skills, healthy habits, self-management skills, and stress reduction   Demonstrate ability to: Increase healthy adjustment to current life circumstances  Progress towards Goals: Ongoing  Interventions: Interventions utilized:  CBT Cognitive Behavioral Therapywas utilized by the clinician during today's follow up session. Clinician met with patient to identify needs related to stressors and functioning, and assess and monitor for signs and symptoms of anxiety and depression, and assess safety. The clinician processed with the patient how they have been doing since the last follow-up session. Clinician measured the patient's depression and anxiety on a numerical scale.  Clinician intervened with positive regard and optimism to validate client's emotions, and supported client in exploring ways  to increase her social supports. Clinician encouraged the patient to reach out to her daughter. Clinician asked the client to journal about what her typical day looks like and share in a follow-up session. The session ended with scheduling.  Standardized Assessments completed: GAD-7 GAD-7= 13   Assessment: Patient currently experiencing see above.   Patient may benefit from see above.  Plan: Follow up with behavioral health clinician on : 04/22/2022 at 2:00 PM Behavioral recommendations:  Referral(s): [facility])  I discussed the assessment and treatment plan with the patient and/or parent/guardian. They were provided an opportunity to ask questions and all were answered. They agreed with the plan and demonstrated an understanding of the instructions.   They were advised to call back or seek an in-person evaluation if the symptoms worsen or if the  condition fails to  improve as anticipated.  Courtney Stafford, LCSWA

## 2022-04-07 ENCOUNTER — Other Ambulatory Visit: Payer: Self-pay

## 2022-04-13 ENCOUNTER — Other Ambulatory Visit: Payer: Self-pay

## 2022-04-13 ENCOUNTER — Ambulatory Visit: Payer: Self-pay | Admitting: Gerontology

## 2022-04-13 ENCOUNTER — Encounter: Payer: Self-pay | Admitting: Gerontology

## 2022-04-13 VITALS — BP 148/94 | HR 91 | Temp 98.0°F | Resp 16 | Ht 60.0 in | Wt 200.3 lb

## 2022-04-13 DIAGNOSIS — I10 Essential (primary) hypertension: Secondary | ICD-10-CM

## 2022-04-13 DIAGNOSIS — G2581 Restless legs syndrome: Secondary | ICD-10-CM

## 2022-04-13 DIAGNOSIS — E109 Type 1 diabetes mellitus without complications: Secondary | ICD-10-CM

## 2022-04-13 LAB — POCT GLYCOSYLATED HEMOGLOBIN (HGB A1C): Hemoglobin A1C: 8.1 % — AB (ref 4.0–5.6)

## 2022-04-13 LAB — GLUCOSE, POCT (MANUAL RESULT ENTRY): POC Glucose: 103 mg/dl — AB (ref 70–99)

## 2022-04-13 MED ORDER — GABAPENTIN 300 MG PO CAPS
ORAL_CAPSULE | ORAL | 1 refills | Status: DC
  Filled 2022-04-13 – 2022-08-06 (×3): qty 210, 30d supply, fill #0
  Filled 2022-12-10: qty 210, 30d supply, fill #1

## 2022-04-13 MED ORDER — BASAGLAR KWIKPEN 100 UNIT/ML ~~LOC~~ SOPN
30.0000 [IU] | PEN_INJECTOR | Freq: Every day | SUBCUTANEOUS | 3 refills | Status: DC
  Filled 2022-04-13: qty 15, 50d supply, fill #0

## 2022-04-13 MED ORDER — UNIFINE PENTIPS 31G X 5 MM MISC
12 refills | Status: DC
  Filled 2022-04-13: qty 200, 90d supply, fill #0
  Filled 2022-05-19: qty 400, 100d supply, fill #0
  Filled 2022-12-10: qty 400, 100d supply, fill #1

## 2022-04-13 MED ORDER — CARVEDILOL 12.5 MG PO TABS
ORAL_TABLET | Freq: Two times a day (BID) | ORAL | 1 refills | Status: DC
  Filled 2022-07-13: qty 180, 90d supply, fill #0
  Filled 2022-12-10: qty 180, 90d supply, fill #1

## 2022-04-13 MED ORDER — HYDRALAZINE HCL 25 MG PO TABS
ORAL_TABLET | Freq: Two times a day (BID) | ORAL | 1 refills | Status: DC
  Filled 2022-07-13: qty 180, 90d supply, fill #0
  Filled 2022-12-10: qty 180, 90d supply, fill #1

## 2022-04-13 MED ORDER — AMLODIPINE BESYLATE 5 MG PO TABS
ORAL_TABLET | Freq: Every day | ORAL | 1 refills | Status: DC
  Filled 2022-07-13: qty 180, 90d supply, fill #0
  Filled 2022-12-10: qty 180, 90d supply, fill #1

## 2022-04-13 NOTE — Progress Notes (Signed)
Established Patient Office Visit  Subjective   Patient ID: Courtney Stafford, female    DOB: 1971-08-28  Age: 51 y.o. MRN: [id number]  Chief Complaint  Patient presents with   Follow-up   Hypertension    Out of Amlodipine x 3 days   Diabetes    Needs Basaglar bridge per K[facility]    HPI  Courtney Stafford  is a 51 y/o female who has history of T1DM, Hypertension, Stroke,presents for follow up visit ,lab review and medication refill. She states that she's compliant with her medications, denies side effects and continues to make healthy lifestyle changes. Her HgbA1c done during visit increased from  7.4%  to 8.1%, and her blood glucose was 103 mg/dl. She checks her blood glucose 3-4 times daily and her fasting readings are usually less than 130 mg/dl, it was 73 mg/dl this morning. She denies hypo/hyperglycemic symptoms, and performs daily foot checks. She was out of her Amlodipine for 3 days, denies headache, weakness and vision changes. Overall, she states that she's doing well and offers no further complaint.  Review of Systems  Constitutional: Negative.   Eyes: Negative.   Respiratory: Negative.    Cardiovascular: Negative.   Skin: Negative.   Neurological: Negative.   Psychiatric/Behavioral: Negative.       Objective:     BP (!) 148/94 (BP Location: Right Arm, Patient Position: Sitting, Cuff Size: Large)   Pulse 91   Temp 98 F (36.7 C) (Oral)   Resp 16   Ht 5' (1.524 m)   Wt 200 lb 4.8 oz (90.9 kg)   LMP 01/03/2016 (Within Years)   SpO2 96%   BMI 39.12 kg/m  BP Readings from Last 3 Encounters:  04/13/22 (!) 148/94  02/02/22 (!) 213/188  02/02/22 (!) 168/110   Wt Readings from Last 3 Encounters:  04/13/22 200 lb 4.8 oz (90.9 kg)  02/02/22 200 lb (90.7 kg)  02/02/22 200 lb 8 oz (90.9 kg)    Encouraged weight loss  Physical Exam HENT:     Head: Normocephalic and atraumatic.     Mouth/Throat:     Mouth: Mucous membranes are moist.  Eyes:      Extraocular Movements: Extraocular movements intact.     Conjunctiva/sclera: Conjunctivae normal.     Pupils: Pupils are equal, round, and reactive to light.  Cardiovascular:     Rate and Rhythm: Normal rate and regular rhythm.     Pulses: Normal pulses.     Heart sounds: Normal heart sounds.  Pulmonary:     Effort: Pulmonary effort is normal.     Breath sounds: Normal breath sounds.  Skin:    General: Skin is warm.  Neurological:     General: No focal deficit present.     Mental Status: She is alert and oriented to person, place, and time. Mental status is at baseline.  Psychiatric:        Mood and Affect: Mood normal.        Behavior: Behavior normal.        Thought Content: Thought content normal.        Judgment: Judgment normal.     Results for orders placed or performed in visit on 04/13/22  POCT Glucose (CBG)  Result Value Ref Range   POC Glucose 103 (A) 70 - 99 mg/dl  POCT HgB A1C  Result Value Ref Range   Hemoglobin A1C 8.1 (A) 4.0 - 5.6 %   HbA1c POC (<> result, manual  entry)     HbA1c, POC (prediabetic range)     HbA1c, POC (controlled diabetic range)      Last CBC Lab Results  Component Value Date   WBC 6.7 02/02/2022   HGB 15.5 (H) 02/02/2022   HCT 48.0 (H) 02/02/2022   MCV 86.0 02/02/2022   MCH 27.8 02/02/2022   RDW 13.7 02/02/2022   PLT 298 [id number]   Last metabolic panel Lab Results  Component Value Date   GLUCOSE 275 (H) 02/02/2022   NA 135 02/02/2022   K 3.2 (L) 02/02/2022   CL 97 (L) 02/02/2022   CO2 30 02/02/2022   BUN 10 02/02/2022   CREATININE 0.73 02/02/2022   GFRNONAA >60 02/02/2022   CALCIUM 9.1 02/02/2022   PHOS 3.6 11/12/2012   PROT 7.4 02/02/2022   ALBUMIN 3.8 02/02/2022   LABGLOB 2.9 01/14/2021   AGRATIO 1.6 01/14/2021   BILITOT 0.6 02/02/2022   ALKPHOS 105 02/02/2022   AST 22 02/02/2022   ALT 19 02/02/2022   ANIONGAP 8 02/02/2022   Last lipids Lab Results  Component Value Date   CHOL 218 (H) 07/31/2020   HDL  78 07/31/2020   LDLCALC 109 (H) 07/31/2020   TRIG 157 (H) 07/31/2020   CHOLHDL 2.8 07/31/2020   Last hemoglobin A1c Lab Results  Component Value Date   HGBA1C 8.1 (A) 04/13/2022   Last thyroid functions Lab Results  Component Value Date   TSH 0.951 07/31/2020   T4TOTAL 6.3 10/04/2017   Last vitamin D No results found for: 25OHVITD2, 25OHVITD3, VD25OH    The ASCVD Risk score (Arnett DK, et al., 2019) failed to calculate for the following reasons:   The patient has a prior MI or stroke diagnosis    Assessment & Plan:   1. Type 1 diabetes mellitus without complication (HCC) - Her HgbA1c was 8.1%, her goal should be less than 6%. She will continue on current medication, low carb/non concentrated sweet diet and exercise as tolerated. - POCT HgB A1C; Future - POCT Glucose (CBG); Future - POCT Glucose (CBG) - POCT HgB A1C - Insulin Glargine (BASAGLAR KWIKPEN) 100 UNIT/ML; Inject 30 Units into the skin at bedtime.  Dispense: 15 mL; Refill: 3 - Insulin Pen Needle (NOVOFINE PEN NEEDLE) 32G X 6 MM MISC; Use as directed  Dispense: 200 each; Refill: 12  2. Essential hypertension - Her blood pressure is not under control, her goal should be less than 140/90, will continue current medication, DASH diet and exercise as tolerated. - amLODipine (NORVASC) 5 MG tablet; TAKE 2 TABLETS (10MG  TOTAL) BY MOUTH ONCE DAILY.  Dispense: 180 tablet; Refill: 1 - carvedilol (COREG) 12.5 MG tablet; TAKE ONE TABLET BY MOUTH 2 TIMES A DAY WITH A MEAL  Dispense: 180 tablet; Refill: 1 - hydrALAZINE (APRESOLINE) 25 MG tablet; TAKE ONE TABLET BY MOUTH 2 TIMES A DAY  Dispense: 180 tablet; Refill: 1  3. Restless legs - Her restless leg is under control and will continue current medication. - gabapentin (NEURONTIN) 300 MG capsule; TAKE 2 CAPSULES (600MG ) BY MOUTH 2 TIMES A DAY AND 3 CAPSULES (900MG ) AT NIGHT  Dispense: 210 capsule; Refill: 1   Return in about 3 months (around 07/15/2022), or if symptoms worsen or  fail to improve.    Aleshia Cartelli Jerold Coombe, NP

## 2022-04-13 NOTE — Patient Instructions (Signed)
DASH Eating Plan DASH stands for Dietary Approaches to Stop Hypertension. The DASH eating plan is a healthy eating plan that has been shown to: Reduce high blood pressure (hypertension). Reduce your risk for type 2 diabetes, heart disease, and stroke. Help with weight loss. What are tips for following this plan? Reading food labels Check food labels for the amount of salt (sodium) per serving. Choose foods with less than 5 percent of the Daily Value of sodium. Generally, foods with less than 300 milligrams (mg) of sodium per serving fit into this eating plan. To find whole grains, look for the word "whole" as the first word in the ingredient list. Shopping Buy products labeled as "low-sodium" or "no salt added." Buy fresh foods. Avoid canned foods and pre-made or frozen meals. Cooking Avoid adding salt when cooking. Use salt-free seasonings or herbs instead of table salt or sea salt. Check with your health care provider or pharmacist before using salt substitutes. Do not fry foods. Cook foods using healthy methods such as baking, boiling, grilling, roasting, and broiling instead. Cook with heart-healthy oils, such as olive, canola, avocado, soybean, or sunflower oil. Meal planning  Eat a balanced diet that includes: 4 or more servings of fruits and 4 or more servings of vegetables each day. Try to fill one-half of your plate with fruits and vegetables. 6-8 servings of whole grains each day. Less than 6 oz (170 g) of lean meat, poultry, or fish each day. A 3-oz (85-g) serving of meat is about the same size as a deck of cards. One egg equals 1 oz (28 g). 2-3 servings of low-fat dairy each day. One serving is 1 cup (237 mL). 1 serving of nuts, seeds, or beans 5 times each week. 2-3 servings of heart-healthy fats. Healthy fats called omega-3 fatty acids are found in foods such as walnuts, flaxseeds, fortified milks, and eggs. These fats are also found in cold-water fish, such as sardines, salmon,  and mackerel. Limit how much you eat of: Canned or prepackaged foods. Food that is high in trans fat, such as some fried foods. Food that is high in saturated fat, such as fatty meat. Desserts and other sweets, sugary drinks, and other foods with added sugar. Full-fat dairy products. Do not salt foods before eating. Do not eat more than 4 egg yolks a week. Try to eat at least 2 vegetarian meals a week. Eat more home-cooked food and less restaurant, buffet, and fast food. Lifestyle When eating at a restaurant, ask that your food be prepared with less salt or no salt, if possible. If you drink alcohol: Limit how much you use to: 0-1 drink a day for women who are not pregnant. 0-2 drinks a day for men. Be aware of how much alcohol is in your drink. In the [place] one drink equals one 12 oz bottle of beer (355 mL), one 5 oz glass of wine (148 mL), or one 1 oz glass of hard liquor (44 mL). General information Avoid eating more than 2,300 mg of salt a day. If you have hypertension, you may need to reduce your sodium intake to 1,500 mg a day. Work with your health care provider to maintain a healthy body weight or to lose weight. Ask what an ideal weight is for you. Get at least 30 minutes of exercise that causes your heart to beat faster (aerobic exercise) most days of the week. Activities may include walking, swimming, or biking. Work with your health care provider or dietitian to   adjust your eating plan to your individual calorie needs. What foods should I eat? Fruits All fresh, dried, or frozen fruit. Canned fruit in natural juice (without added sugar). Vegetables Fresh or frozen vegetables (raw, steamed, roasted, or grilled). Low-sodium or reduced-sodium tomato and vegetable juice. Low-sodium or reduced-sodium tomato sauce and tomato paste. Low-sodium or reduced-sodium canned vegetables. Grains Whole-grain or whole-wheat bread. Whole-grain or whole-wheat pasta. Brown rice. Oatmeal. Quinoa.  Bulgur. Whole-grain and low-sodium cereals. Pita bread. Low-fat, low-sodium crackers. Whole-wheat flour tortillas. Meats and other proteins Skinless chicken or turkey. Ground chicken or turkey. Pork with fat trimmed off. Fish and seafood. Egg whites. Dried beans, peas, or lentils. Unsalted nuts, nut butters, and seeds. Unsalted canned beans. Lean cuts of beef with fat trimmed off. Low-sodium, lean precooked or cured meat, such as sausages or meat loaves. Dairy Low-fat (1%) or fat-free (skim) milk. Reduced-fat, low-fat, or fat-free cheeses. Nonfat, low-sodium ricotta or cottage cheese. Low-fat or nonfat yogurt. Low-fat, low-sodium cheese. Fats and oils Soft margarine without trans fats. Vegetable oil. Reduced-fat, low-fat, or light mayonnaise and salad dressings (reduced-sodium). Canola, safflower, olive, avocado, soybean, and sunflower oils. Avocado. Seasonings and condiments Herbs. Spices. Seasoning mixes without salt. Other foods Unsalted popcorn and pretzels. Fat-free sweets. The items listed above may not be a complete list of foods and beverages you can eat. Contact a dietitian for more information. What foods should I avoid? Fruits Canned fruit in a light or heavy syrup. Fried fruit. Fruit in cream or butter sauce. Vegetables Creamed or fried vegetables. Vegetables in a cheese sauce. Regular canned vegetables (not low-sodium or reduced-sodium). Regular canned tomato sauce and paste (not low-sodium or reduced-sodium). Regular tomato and vegetable juice (not low-sodium or reduced-sodium). Pickles. Olives. Grains Baked goods made with fat, such as croissants, muffins, or some breads. Dry pasta or rice meal packs. Meats and other proteins Fatty cuts of meat. Ribs. Fried meat. Bacon. Bologna, salami, and other precooked or cured meats, such as sausages or meat loaves. Fat from the back of a pig (fatback). Bratwurst. Salted nuts and seeds. Canned beans with added salt. Canned or smoked fish.  Whole eggs or egg yolks. Chicken or turkey with skin. Dairy Whole or 2% milk, cream, and half-and-half. Whole or full-fat cream cheese. Whole-fat or sweetened yogurt. Full-fat cheese. Nondairy creamers. Whipped toppings. Processed cheese and cheese spreads. Fats and oils Butter. Stick margarine. Lard. Shortening. Ghee. Bacon fat. Tropical oils, such as coconut, palm kernel, or palm oil. Seasonings and condiments Onion salt, garlic salt, seasoned salt, table salt, and sea salt. Worcestershire sauce. Tartar sauce. Barbecue sauce. Teriyaki sauce. Soy sauce, including reduced-sodium. Steak sauce. Canned and packaged gravies. Fish sauce. Oyster sauce. Cocktail sauce. Store-bought horseradish. Ketchup. Mustard. Meat flavorings and tenderizers. Bouillon cubes. Hot sauces. Pre-made or packaged marinades. Pre-made or packaged taco seasonings. Relishes. Regular salad dressings. Other foods Salted popcorn and pretzels. The items listed above may not be a complete list of foods and beverages you should avoid. Contact a dietitian for more information. Where to find more information National Heart, Lung, and Blood Institute: www.nhlbi.nih.gov American Heart Association: www.heart.org Academy of Nutrition and Dietetics: www.eatright.org National Kidney Foundation: www.kidney.org Summary The DASH eating plan is a healthy eating plan that has been shown to reduce high blood pressure (hypertension). It may also reduce your risk for type 2 diabetes, heart disease, and stroke. When on the DASH eating plan, aim to eat more fresh fruits and vegetables, whole grains, lean proteins, low-fat dairy, and heart-healthy fats. With the DASH   eating plan, you should limit salt (sodium) intake to 2,300 mg a day. If you have hypertension, you may need to reduce your sodium intake to 1,500 mg a day. Work with your health care provider or dietitian to adjust your eating plan to your individual calorie needs. This information is not  intended to replace advice given to you by your health care provider. Make sure you discuss any questions you have with your health care provider. Document Revised: 10/05/2019 Document Reviewed: 10/05/2019 Elsevier Patient Education  2023 Elsevier Inc. Carbohydrate Counting for Diabetes Mellitus, Adult Carbohydrate counting is a method of keeping track of how many carbohydrates you eat. Eating carbohydrates increases the amount of sugar (glucose) in the blood. Counting how many carbohydrates you eat improves how well you manage your blood glucose. This, in turn, helps you manage your diabetes. Carbohydrates are measured in grams (g) per serving. It is important to know how many carbohydrates (in grams or by serving size) you can have in each meal. This is different for every person. A dietitian can help you make a meal plan and calculate how many carbohydrates you should have at each meal and snack. What foods contain carbohydrates? Carbohydrates are found in the following foods: Grains, such as breads and cereals. Dried beans and soy products. Starchy vegetables, such as potatoes, peas, and corn. Fruit and fruit juices. Milk and yogurt. Sweets and snack foods, such as cake, cookies, candy, chips, and soft drinks. How do I count carbohydrates in foods? There are two ways to count carbohydrates in food. You can read food labels or learn standard serving sizes of foods. You can use either of these methods or a combination of both. Using the Nutrition Facts label The Nutrition Facts list is included on the labels of almost all packaged foods and beverages in the [place]. It includes: The serving size. Information about nutrients in each serving, including the grams of carbohydrate per serving. To use the Nutrition Facts, decide how many servings you will have. Then, multiply the number of servings by the number of carbohydrates per serving. The resulting number is the total grams of  carbohydrates that you will be having. Learning the standard serving sizes of foods When you eat carbohydrate foods that are not packaged or do not include Nutrition Facts on the label, you need to measure the servings in order to count the grams of carbohydrates. Measure the foods that you will eat with a food scale or measuring cup, if needed. Decide how many standard-size servings you will eat. Multiply the number of servings by 15. For foods that contain carbohydrates, one serving equals 15 g of carbohydrates. For example, if you eat 2 cups or 10 oz (300 g) of strawberries, you will have eaten 2 servings and 30 g of carbohydrates (2 servings x 15 g = 30 g). For foods that have more than one food mixed, such as soups and casseroles, you must count the carbohydrates in each food that is included. The following list contains standard serving sizes of common carbohydrate-rich foods. Each of these servings has about 15 g of carbohydrates: 1 slice of bread. 1 six-inch (15 cm) tortilla. ? cup or 2 oz (53 g) cooked rice or pasta.  cup or 3 oz (85 g) cooked or canned, drained and rinsed beans or lentils.  cup or 3 oz (85 g) starchy vegetable, such as peas, corn, or squash.  cup or 4 oz (120 g) hot cereal.  cup or 3 oz (85   g) boiled or mashed potatoes, or  or 3 oz (85 g) of a large baked potato.  cup or 4 fl oz (118 mL) fruit juice. 1 cup or 8 fl oz (237 mL) milk. 1 small or 4 oz (106 g) apple.  or 2 oz (63 g) of a medium banana. 1 cup or 5 oz (150 g) strawberries. 3 cups or 1 oz (28.3 g) popped popcorn. What is an example of carbohydrate counting? To calculate the grams of carbohydrates in this sample meal, follow the steps shown below. Sample meal 3 oz (85 g) chicken breast. ? cup or 4 oz (106 g) brown rice.  cup or 3 oz (85 g) corn. 1 cup or 8 fl oz (237 mL) milk. 1 cup or 5 oz (150 g) strawberries with sugar-free whipped topping. Carbohydrate calculation Identify the foods that  contain carbohydrates: Rice. Corn. Milk. Strawberries. Calculate how many servings you have of each food: 2 servings rice. 1 serving corn. 1 serving milk. 1 serving strawberries. Multiply each number of servings by 15 g: 2 servings rice x 15 g = 30 g. 1 serving corn x 15 g = 15 g. 1 serving milk x 15 g = 15 g. 1 serving strawberries x 15 g = 15 g. Add together all of the amounts to find the total grams of carbohydrates eaten: 30 g + 15 g + 15 g + 15 g = 75 g of carbohydrates total. What are tips for following this plan? Shopping Develop a meal plan and then make a shopping list. Buy fresh and frozen vegetables, fresh and frozen fruit, dairy, eggs, beans, lentils, and whole grains. Look at food labels. Choose foods that have more fiber and less sugar. Avoid processed foods and foods with added sugars. Meal planning Aim to have the same number of grams of carbohydrates at each meal and for each snack time. Plan to have regular, balanced meals and snacks. Where to find more information American Diabetes Association: diabetes.org Centers for Disease Control and Prevention: cdc.gov Academy of Nutrition and Dietetics: eatright.org Association of Diabetes Care & Education Specialists: diabeteseducator.org Summary Carbohydrate counting is a method of keeping track of how many carbohydrates you eat. Eating carbohydrates increases the amount of sugar (glucose) in your blood. Counting how many carbohydrates you eat improves how well you manage your blood glucose. This helps you manage your diabetes. A dietitian can help you make a meal plan and calculate how many carbohydrates you should have at each meal and snack. This information is not intended to replace advice given to you by your health care provider. Make sure you discuss any questions you have with your health care provider. Document Revised: 06/04/2020 Document Reviewed: 06/04/2020 Elsevier Patient Education  2023 Elsevier  Inc.  

## 2022-04-21 ENCOUNTER — Other Ambulatory Visit: Payer: Self-pay

## 2022-04-21 ENCOUNTER — Other Ambulatory Visit: Payer: Self-pay | Admitting: Pharmacy Technician

## 2022-04-21 NOTE — Patient Outreach (Signed)
Received updated proof of income.  Patient eligible to receive medication assistance at [facility] until time for re-certification in 2024, and as long as eligibility requirements continue to be met.  Belisa Eichholz J. Chanay Nugent Care Manager [facility]  

## 2022-04-22 ENCOUNTER — Other Ambulatory Visit: Payer: Self-pay

## 2022-04-22 ENCOUNTER — Ambulatory Visit: Payer: Self-pay | Admitting: Licensed Clinical Social Worker

## 2022-04-22 DIAGNOSIS — F411 Generalized anxiety disorder: Secondary | ICD-10-CM

## 2022-04-22 DIAGNOSIS — F331 Major depressive disorder, recurrent, moderate: Secondary | ICD-10-CM

## 2022-04-22 DIAGNOSIS — F4310 Post-traumatic stress disorder, unspecified: Secondary | ICD-10-CM

## 2022-04-22 NOTE — BH Specialist Note (Signed)
Integrated Behavioral Health via Telemedicine Visit  04/22/2022 Courtney Stafford [id number]   Referring Provider: [name], NP  Patient/Family location: The patient's home [name] Provider location: [facility] All persons participating in visit: Courtney Stafford. [employer], LCSW-A Types of Service: Telephone visit  I connected with Courtney Stafford via  Telephone or Video Enabled Telemedicine Application  (Video is Caregility application) and verified that I am speaking with the correct person using two identifiers. Discussed confidentiality: Yes   I discussed the limitations of telemedicine and the availability of in person appointments.  Discussed there is a possibility of technology failure and discussed alternative modes of communication if that failure occurs.  Patient and/or legal guardian expressed understanding and consented to Telemedicine visit: Yes   Presenting Concerns: Patient and/or family reports the following symptoms/concerns: The patient reports that she has been doing about the same since her last follow- up appointment. Courtney Stafford shared that she did receive transportation assistance [facility] to go to her doctors appointment and pharmacy last week. She noted the taxi was on time and she did not have any problems along the way there or on her trip home. She noted that she still felt anxious and worried that something would go wrong and she would be stranded. The patient shared that she was not able to pick up all her medications and has no way to get her mirtazapine now. She requested the clinician message her primary care provider to see if their was anything they could do to help her get her medication. Courtney Stafford stated that she will be leaving on Sunday and will not return until Wednesday late evenings for the rest of the summer  because she will be babysitting her grandson. She explained that her daughter could not pick up her medication or  give her a ride because she works late. The patient discussed other situational stressors impacting her life currently. She noted that she is not feeling well today and has been struggling with an upset stomach this week. The patient stated she was overall doing okay. Courtney Stafford denied any suicidal or homicidal thoughts.  Duration of problem: Years; Severity of problem: moderate  Patient and/or Family's Strengths/Protective Factors: Concrete supports in place (healthy food, safe environments, etc.) and Sense of purpose  Goals Addressed: Patient will:  Reduce symptoms of: agitation, anxiety, depression, insomnia, and stress   Increase knowledge and/or ability of: coping skills, healthy habits, self-management skills, and stress reduction   Demonstrate ability to: Increase healthy adjustment to current life circumstances  Progress towards Goals: Ongoing  Interventions: Interventions utilized:  CBT Cognitive Behavioral Therapy was utilized by the clinician during today's follow up session. Clinician met with patient to identify needs related to stressors and functioning, and assess and monitor for signs and symptoms of anxiety and depression, and assess safety. The clinician processed with the patient how they have been doing since the last follow-up session. Clinician provided a safe judgement free space for the patient to vent her frustrations regarding her current life circumstances. Clinician encouraged the patient to reach out to her family and acquaintances for help with transportation.Clinician relayed the patient's message to her primary care provider regarding not being able to pick up her medications. Clinician taught the patient a thought stopping technique and encouraged her to practice using this when a negative thought occurs such as being stranded. The session ended with scheduling.  Standardized Assessments completed: GAD-7 and PHQ 9 PHQ-9= 16 GAD-7= 11  Assessment: Patient currently  experiencing see above.   Patient may benefit from see above.  Plan: Follow up with behavioral health clinician on : 05/06/2022 at 4:00 PM  Behavioral recommendations:  Referral(s): [place] (In Clinic)  I discussed the assessment and treatment plan with the patient and/or parent/guardian. They were provided an opportunity to ask questions and all were answered. They agreed with the plan and demonstrated an understanding of the instructions.   They were advised to call back or seek an in-person evaluation if the symptoms worsen or if the condition fails to improve as anticipated.  Lesli Albee, LCSWA

## 2022-04-23 ENCOUNTER — Other Ambulatory Visit: Payer: Self-pay

## 2022-05-03 ENCOUNTER — Other Ambulatory Visit: Payer: Self-pay

## 2022-05-03 MED ORDER — INSULIN ASPART 100 UNIT/ML FLEXPEN: PEN_INJECTOR | SUBCUTANEOUS | 3 refills | Status: DC

## 2022-05-03 MED ORDER — INSULIN GLARGINE 100 UNIT/ML SOLOSTAR PEN
PEN_INJECTOR | SUBCUTANEOUS | 3 refills | Status: DC
  Filled 2022-05-19: qty 45, 150d supply, fill #0
  Filled 2022-10-20: qty 30, 100d supply, fill #1

## 2022-05-06 ENCOUNTER — Ambulatory Visit: Payer: Self-pay | Admitting: Licensed Clinical Social Worker

## 2022-05-12 ENCOUNTER — Other Ambulatory Visit: Payer: Self-pay

## 2022-05-19 ENCOUNTER — Other Ambulatory Visit: Payer: Self-pay

## 2022-05-19 MED FILL — Glucose Blood Test Strip: 100 days supply | Qty: 400 | Fill #0 | Status: AC

## 2022-05-20 ENCOUNTER — Other Ambulatory Visit: Payer: Self-pay

## 2022-06-02 ENCOUNTER — Other Ambulatory Visit: Payer: Self-pay | Admitting: Pharmacy Technician

## 2022-06-02 NOTE — Patient Outreach (Signed)
Patient has medications ready for pick-up at [facility].  She has mobility issues that prevent her from walking to a LINK pick-up site.  She does not have family or friends to bring her to pick-up her medications.  Ride coordinated to and from the clinic using [employer].  Courtney Stafford Patient Advocate Specialist [facility]

## 2022-06-08 ENCOUNTER — Other Ambulatory Visit: Payer: Self-pay

## 2022-07-08 ENCOUNTER — Telehealth: Payer: Self-pay | Admitting: Gerontology

## 2022-07-08 NOTE — Telephone Encounter (Signed)
Pt called in and I advised pt about pick up time for taxi on 08/30. Pt verbalized understanding.

## 2022-07-13 ENCOUNTER — Other Ambulatory Visit: Payer: Self-pay | Admitting: Pharmacy Technician

## 2022-07-13 ENCOUNTER — Other Ambulatory Visit: Payer: Self-pay

## 2022-07-13 NOTE — Patient Outreach (Signed)
Patient has medications ready for pick-up at [facility].  She does not live in an area that provides LINK.  She does not have family or friends to bring her to pick-up her medications.  Ride coordinated to and from the clinic using [employer].  Alaja Goldinger J. Gio Janoski Patient Advocate Specialist [facility] 

## 2022-07-14 ENCOUNTER — Telehealth: Payer: Self-pay | Admitting: Gerontology

## 2022-07-14 ENCOUNTER — Ambulatory Visit: Payer: Self-pay | Admitting: Licensed Clinical Social Worker

## 2022-07-14 ENCOUNTER — Encounter: Payer: Self-pay | Admitting: Gerontology

## 2022-07-14 ENCOUNTER — Other Ambulatory Visit: Payer: Self-pay

## 2022-07-14 ENCOUNTER — Ambulatory Visit: Payer: Self-pay | Admitting: Gerontology

## 2022-07-14 VITALS — BP 144/88 | HR 86 | Temp 98.3°F | Resp 18 | Ht 60.0 in | Wt 192.6 lb

## 2022-07-14 DIAGNOSIS — M545 Low back pain, unspecified: Secondary | ICD-10-CM

## 2022-07-14 DIAGNOSIS — F411 Generalized anxiety disorder: Secondary | ICD-10-CM

## 2022-07-14 DIAGNOSIS — F331 Major depressive disorder, recurrent, moderate: Secondary | ICD-10-CM

## 2022-07-14 DIAGNOSIS — E109 Type 1 diabetes mellitus without complications: Secondary | ICD-10-CM

## 2022-07-14 DIAGNOSIS — I639 Cerebral infarction, unspecified: Secondary | ICD-10-CM

## 2022-07-14 DIAGNOSIS — I10 Essential (primary) hypertension: Secondary | ICD-10-CM

## 2022-07-14 DIAGNOSIS — F4310 Post-traumatic stress disorder, unspecified: Secondary | ICD-10-CM

## 2022-07-14 LAB — POCT GLYCOSYLATED HEMOGLOBIN (HGB A1C): Hemoglobin A1C: 8.4 % — AB (ref 4.0–5.6)

## 2022-07-14 LAB — GLUCOSE, POCT (MANUAL RESULT ENTRY): POC Glucose: 186 mg/dl — AB (ref 70–99)

## 2022-07-14 MED ORDER — MIRTAZAPINE 15 MG PO TABS
15.0000 mg | ORAL_TABLET | Freq: Every day | ORAL | 0 refills | Status: DC
  Filled 2022-07-14: qty 90, 90d supply, fill #0

## 2022-07-14 MED ORDER — LISINOPRIL 40 MG PO TABS
ORAL_TABLET | Freq: Every day | ORAL | 1 refills | Status: DC
  Filled 2022-07-15: qty 90, 90d supply, fill #0
  Filled 2022-12-10: qty 90, 90d supply, fill #1

## 2022-07-14 MED ORDER — RIGHTEST GS550 BLOOD GLUCOSE VI STRP
ORAL_STRIP | 11 refills | Status: DC
  Filled 2022-07-14: qty 100, 50d supply, fill #0
  Filled 2023-05-11: qty 100, 25d supply, fill #0

## 2022-07-14 MED ORDER — RIGHTEST GL300 LANCETS MISC
3 refills | Status: DC
  Filled 2022-07-14: qty 200, 50d supply, fill #0

## 2022-07-14 MED ORDER — ATORVASTATIN CALCIUM 80 MG PO TABS
ORAL_TABLET | Freq: Every day | ORAL | 3 refills | Status: DC
  Filled 2022-07-14: qty 90, 90d supply, fill #0
  Filled 2022-12-10: qty 90, 90d supply, fill #1
  Filled 2023-02-01 – 2023-04-28 (×2): qty 90, 90d supply, fill #2
  Filled 2023-05-11 (×2): qty 30, 30d supply, fill #2

## 2022-07-14 MED ORDER — HYDROCHLOROTHIAZIDE 25 MG PO TABS
ORAL_TABLET | Freq: Every day | ORAL | 1 refills | Status: DC
  Filled 2022-07-15: qty 90, 90d supply, fill #0
  Filled 2022-12-10: qty 90, 90d supply, fill #1

## 2022-07-14 NOTE — Telephone Encounter (Signed)
A user error has taken place. Do not reschedule.

## 2022-07-14 NOTE — Patient Instructions (Signed)
DASH Eating Plan DASH stands for Dietary Approaches to Stop Hypertension. The DASH eating plan is a healthy eating plan that has been shown to: Reduce high blood pressure (hypertension). Reduce your risk for type 2 diabetes, heart disease, and stroke. Help with weight loss. What are tips for following this plan? Reading food labels Check food labels for the amount of salt (sodium) per serving. Choose foods with less than 5 percent of the Daily Value of sodium. Generally, foods with less than 300 milligrams (mg) of sodium per serving fit into this eating plan. To find whole grains, look for the word "whole" as the first word in the ingredient list. Shopping Buy products labeled as "low-sodium" or "no salt added." Buy fresh foods. Avoid canned foods and pre-made or frozen meals. Cooking Avoid adding salt when cooking. Use salt-free seasonings or herbs instead of table salt or sea salt. Check with your health care provider or pharmacist before using salt substitutes. Do not fry foods. Cook foods using healthy methods such as baking, boiling, grilling, roasting, and broiling instead. Cook with heart-healthy oils, such as olive, canola, avocado, soybean, or sunflower oil. Meal planning  Eat a balanced diet that includes: 4 or more servings of fruits and 4 or more servings of vegetables each day. Try to fill one-half of your plate with fruits and vegetables. 6-8 servings of whole grains each day. Less than 6 oz (170 g) of lean meat, poultry, or fish each day. A 3-oz (85-g) serving of meat is about the same size as a deck of cards. One egg equals 1 oz (28 g). 2-3 servings of low-fat dairy each day. One serving is 1 cup (237 mL). 1 serving of nuts, seeds, or beans 5 times each week. 2-3 servings of heart-healthy fats. Healthy fats called omega-3 fatty acids are found in foods such as walnuts, flaxseeds, fortified milks, and eggs. These fats are also found in cold-water fish, such as sardines, salmon,  and mackerel. Limit how much you eat of: Canned or prepackaged foods. Food that is high in trans fat, such as some fried foods. Food that is high in saturated fat, such as fatty meat. Desserts and other sweets, sugary drinks, and other foods with added sugar. Full-fat dairy products. Do not salt foods before eating. Do not eat more than 4 egg yolks a week. Try to eat at least 2 vegetarian meals a week. Eat more home-cooked food and less restaurant, buffet, and fast food. Lifestyle When eating at a restaurant, ask that your food be prepared with less salt or no salt, if possible. If you drink alcohol: Limit how much you use to: 0-1 drink a day for women who are not pregnant. 0-2 drinks a day for men. Be aware of how much alcohol is in your drink. In the [place] one drink equals one 12 oz bottle of beer (355 mL), one 5 oz glass of wine (148 mL), or one 1 oz glass of hard liquor (44 mL). General information Avoid eating more than 2,300 mg of salt a day. If you have hypertension, you may need to reduce your sodium intake to 1,500 mg a day. Work with your health care provider to maintain a healthy body weight or to lose weight. Ask what an ideal weight is for you. Get at least 30 minutes of exercise that causes your heart to beat faster (aerobic exercise) most days of the week. Activities may include walking, swimming, or biking. Work with your health care provider or dietitian to   adjust your eating plan to your individual calorie needs. What foods should I eat? Fruits All fresh, dried, or frozen fruit. Canned fruit in natural juice (without added sugar). Vegetables Fresh or frozen vegetables (raw, steamed, roasted, or grilled). Low-sodium or reduced-sodium tomato and vegetable juice. Low-sodium or reduced-sodium tomato sauce and tomato paste. Low-sodium or reduced-sodium canned vegetables. Grains Whole-grain or whole-wheat bread. Whole-grain or whole-wheat pasta. Brown rice. Oatmeal. Quinoa.  Bulgur. Whole-grain and low-sodium cereals. Pita bread. Low-fat, low-sodium crackers. Whole-wheat flour tortillas. Meats and other proteins Skinless chicken or turkey. Ground chicken or turkey. Pork with fat trimmed off. Fish and seafood. Egg whites. Dried beans, peas, or lentils. Unsalted nuts, nut butters, and seeds. Unsalted canned beans. Lean cuts of beef with fat trimmed off. Low-sodium, lean precooked or cured meat, such as sausages or meat loaves. Dairy Low-fat (1%) or fat-free (skim) milk. Reduced-fat, low-fat, or fat-free cheeses. Nonfat, low-sodium ricotta or cottage cheese. Low-fat or nonfat yogurt. Low-fat, low-sodium cheese. Fats and oils Soft margarine without trans fats. Vegetable oil. Reduced-fat, low-fat, or light mayonnaise and salad dressings (reduced-sodium). Canola, safflower, olive, avocado, soybean, and sunflower oils. Avocado. Seasonings and condiments Herbs. Spices. Seasoning mixes without salt. Other foods Unsalted popcorn and pretzels. Fat-free sweets. The items listed above may not be a complete list of foods and beverages you can eat. Contact a dietitian for more information. What foods should I avoid? Fruits Canned fruit in a light or heavy syrup. Fried fruit. Fruit in cream or butter sauce. Vegetables Creamed or fried vegetables. Vegetables in a cheese sauce. Regular canned vegetables (not low-sodium or reduced-sodium). Regular canned tomato sauce and paste (not low-sodium or reduced-sodium). Regular tomato and vegetable juice (not low-sodium or reduced-sodium). Pickles. Olives. Grains Baked goods made with fat, such as croissants, muffins, or some breads. Dry pasta or rice meal packs. Meats and other proteins Fatty cuts of meat. Ribs. Fried meat. Bacon. Bologna, salami, and other precooked or cured meats, such as sausages or meat loaves. Fat from the back of a pig (fatback). Bratwurst. Salted nuts and seeds. Canned beans with added salt. Canned or smoked fish.  Whole eggs or egg yolks. Chicken or turkey with skin. Dairy Whole or 2% milk, cream, and half-and-half. Whole or full-fat cream cheese. Whole-fat or sweetened yogurt. Full-fat cheese. Nondairy creamers. Whipped toppings. Processed cheese and cheese spreads. Fats and oils Butter. Stick margarine. Lard. Shortening. Ghee. Bacon fat. Tropical oils, such as coconut, palm kernel, or palm oil. Seasonings and condiments Onion salt, garlic salt, seasoned salt, table salt, and sea salt. Worcestershire sauce. Tartar sauce. Barbecue sauce. Teriyaki sauce. Soy sauce, including reduced-sodium. Steak sauce. Canned and packaged gravies. Fish sauce. Oyster sauce. Cocktail sauce. Store-bought horseradish. Ketchup. Mustard. Meat flavorings and tenderizers. Bouillon cubes. Hot sauces. Pre-made or packaged marinades. Pre-made or packaged taco seasonings. Relishes. Regular salad dressings. Other foods Salted popcorn and pretzels. The items listed above may not be a complete list of foods and beverages you should avoid. Contact a dietitian for more information. Where to find more information National Heart, Lung, and Blood Institute: www.nhlbi.nih.gov American Heart Association: www.heart.org Academy of Nutrition and Dietetics: www.eatright.org National Kidney Foundation: www.kidney.org Summary The DASH eating plan is a healthy eating plan that has been shown to reduce high blood pressure (hypertension). It may also reduce your risk for type 2 diabetes, heart disease, and stroke. When on the DASH eating plan, aim to eat more fresh fruits and vegetables, whole grains, lean proteins, low-fat dairy, and heart-healthy fats. With the DASH   eating plan, you should limit salt (sodium) intake to 2,300 mg a day. If you have hypertension, you may need to reduce your sodium intake to 1,500 mg a day. Work with your health care provider or dietitian to adjust your eating plan to your individual calorie needs. This information is not  intended to replace advice given to you by your health care provider. Make sure you discuss any questions you have with your health care provider. Document Revised: 10/05/2019 Document Reviewed: 10/05/2019 Elsevier Patient Education  2023 Elsevier Inc. Carbohydrate Counting for Diabetes Mellitus, Adult Carbohydrate counting is a method of keeping track of how many carbohydrates you eat. Eating carbohydrates increases the amount of sugar (glucose) in the blood. Counting how many carbohydrates you eat improves how well you manage your blood glucose. This, in turn, helps you manage your diabetes. Carbohydrates are measured in grams (g) per serving. It is important to know how many carbohydrates (in grams or by serving size) you can have in each meal. This is different for every person. A dietitian can help you make a meal plan and calculate how many carbohydrates you should have at each meal and snack. What foods contain carbohydrates? Carbohydrates are found in the following foods: Grains, such as breads and cereals. Dried beans and soy products. Starchy vegetables, such as potatoes, peas, and corn. Fruit and fruit juices. Milk and yogurt. Sweets and snack foods, such as cake, cookies, candy, chips, and soft drinks. How do I count carbohydrates in foods? There are two ways to count carbohydrates in food. You can read food labels or learn standard serving sizes of foods. You can use either of these methods or a combination of both. Using the Nutrition Facts label The Nutrition Facts list is included on the labels of almost all packaged foods and beverages in the [place]. It includes: The serving size. Information about nutrients in each serving, including the grams of carbohydrate per serving. To use the Nutrition Facts, decide how many servings you will have. Then, multiply the number of servings by the number of carbohydrates per serving. The resulting number is the total grams of  carbohydrates that you will be having. Learning the standard serving sizes of foods When you eat carbohydrate foods that are not packaged or do not include Nutrition Facts on the label, you need to measure the servings in order to count the grams of carbohydrates. Measure the foods that you will eat with a food scale or measuring cup, if needed. Decide how many standard-size servings you will eat. Multiply the number of servings by 15. For foods that contain carbohydrates, one serving equals 15 g of carbohydrates. For example, if you eat 2 cups or 10 oz (300 g) of strawberries, you will have eaten 2 servings and 30 g of carbohydrates (2 servings x 15 g = 30 g). For foods that have more than one food mixed, such as soups and casseroles, you must count the carbohydrates in each food that is included. The following list contains standard serving sizes of common carbohydrate-rich foods. Each of these servings has about 15 g of carbohydrates: 1 slice of bread. 1 six-inch (15 cm) tortilla. ? cup or 2 oz (53 g) cooked rice or pasta.  cup or 3 oz (85 g) cooked or canned, drained and rinsed beans or lentils.  cup or 3 oz (85 g) starchy vegetable, such as peas, corn, or squash.  cup or 4 oz (120 g) hot cereal.  cup or 3 oz (85   g) boiled or mashed potatoes, or  or 3 oz (85 g) of a large baked potato.  cup or 4 fl oz (118 mL) fruit juice. 1 cup or 8 fl oz (237 mL) milk. 1 small or 4 oz (106 g) apple.  or 2 oz (63 g) of a medium banana. 1 cup or 5 oz (150 g) strawberries. 3 cups or 1 oz (28.3 g) popped popcorn. What is an example of carbohydrate counting? To calculate the grams of carbohydrates in this sample meal, follow the steps shown below. Sample meal 3 oz (85 g) chicken breast. ? cup or 4 oz (106 g) brown rice.  cup or 3 oz (85 g) corn. 1 cup or 8 fl oz (237 mL) milk. 1 cup or 5 oz (150 g) strawberries with sugar-free whipped topping. Carbohydrate calculation Identify the foods that  contain carbohydrates: Rice. Corn. Milk. Strawberries. Calculate how many servings you have of each food: 2 servings rice. 1 serving corn. 1 serving milk. 1 serving strawberries. Multiply each number of servings by 15 g: 2 servings rice x 15 g = 30 g. 1 serving corn x 15 g = 15 g. 1 serving milk x 15 g = 15 g. 1 serving strawberries x 15 g = 15 g. Add together all of the amounts to find the total grams of carbohydrates eaten: 30 g + 15 g + 15 g + 15 g = 75 g of carbohydrates total. What are tips for following this plan? Shopping Develop a meal plan and then make a shopping list. Buy fresh and frozen vegetables, fresh and frozen fruit, dairy, eggs, beans, lentils, and whole grains. Look at food labels. Choose foods that have more fiber and less sugar. Avoid processed foods and foods with added sugars. Meal planning Aim to have the same number of grams of carbohydrates at each meal and for each snack time. Plan to have regular, balanced meals and snacks. Where to find more information American Diabetes Association: diabetes.org Centers for Disease Control and Prevention: cdc.gov Academy of Nutrition and Dietetics: eatright.org Association of Diabetes Care & Education Specialists: diabeteseducator.org Summary Carbohydrate counting is a method of keeping track of how many carbohydrates you eat. Eating carbohydrates increases the amount of sugar (glucose) in your blood. Counting how many carbohydrates you eat improves how well you manage your blood glucose. This helps you manage your diabetes. A dietitian can help you make a meal plan and calculate how many carbohydrates you should have at each meal and snack. This information is not intended to replace advice given to you by your health care provider. Make sure you discuss any questions you have with your health care provider. Document Revised: 06/04/2020 Document Reviewed: 06/04/2020 Elsevier Patient Education  2023 Elsevier  Inc.  

## 2022-07-14 NOTE — Telephone Encounter (Signed)
Called pt to reschedule appt today from 5 to 4 per [facility]. No answer.

## 2022-07-14 NOTE — Progress Notes (Signed)
Established Patient Office Visit  Subjective   Patient ID: Courtney Stafford, female    DOB: 1971-04-29  Age: 51 y.o. MRN: [id number]  Chief Complaint  Patient presents with   Follow-up    HPI  Courtney Stafford  is a 51 y/o female who has history of T1DM, Hypertension, Stroke,presents for follow up visit ,lab review and medication refill. Her HgbA1c done during visit increased from 8.1% to 8.4%., her blood glucose was 186 mg/dl. She checks her blood glucose 3 to 4 times daily, and her readings ranges between 150- 230 mg/dl. S he states that she's compliant with her medications, denies side effects and continues to make healthy lifestyle changes. She denies hypo/hyperglycemic symptoms and peripheral neuropathy is improved with taking gabapentin. She also c/o intermittent lower back pain that has been going on for 2 weeks, she denies dysuria, urinary frequency, urgency, fever, chills, flank and pelvic pain. She states that her mood is good, denies suicidal nor homicidal ideation. Overall, she states that she's doing well and offers no further complaint.   Review of Systems  Constitutional: Negative.   Eyes: Negative.   Respiratory: Negative.    Cardiovascular: Negative.   Musculoskeletal:  Positive for back pain.  Skin: Negative.   Neurological: Negative.   Psychiatric/Behavioral: Negative.        Objective:     BP (!) 144/88 (BP Location: Left Arm, Patient Position: Sitting, Cuff Size: Large)   Pulse 86   Temp 98.3 F (36.8 C) (Oral)   Resp 18   Ht 5' (1.524 m)   Wt 192 lb 9.6 oz (87.4 kg)   LMP 01/03/2016 (Within Years)   SpO2 96%   BMI 37.61 kg/m  BP Readings from Last 3 Encounters:  07/14/22 (!) 144/88  04/13/22 (!) 148/94  02/02/22 (!) 213/188   Wt Readings from Last 3 Encounters:  07/14/22 192 lb 9.6 oz (87.4 kg)  04/13/22 200 lb 4.8 oz (90.9 kg)  02/02/22 200 lb (90.7 kg)    Encouraged on weight loss  Physical Exam HENT:     Head: Normocephalic and  atraumatic.     Mouth/Throat:     Mouth: Mucous membranes are moist.  Eyes:     Extraocular Movements: Extraocular movements intact.     Conjunctiva/sclera: Conjunctivae normal.     Pupils: Pupils are equal, round, and reactive to light.  Cardiovascular:     Rate and Rhythm: Normal rate and regular rhythm.     Pulses: Normal pulses.     Heart sounds: Normal heart sounds.  Pulmonary:     Effort: Pulmonary effort is normal.     Breath sounds: Normal breath sounds.  Musculoskeletal:        General: Normal range of motion.  Skin:    General: Skin is warm.  Neurological:     General: No focal deficit present.     Mental Status: She is alert and oriented to person, place, and time. Mental status is at baseline.  Psychiatric:        Mood and Affect: Mood normal.        Behavior: Behavior normal.        Thought Content: Thought content normal.        Judgment: Judgment normal.      Results for orders placed or performed in visit on 07/14/22  POCT HgB A1C  Result Value Ref Range   Hemoglobin A1C 8.4 (A) 4.0 - 5.6 %   HbA1c POC (<> result, manual entry)  HbA1c, POC (prediabetic range)     HbA1c, POC (controlled diabetic range)    POCT Glucose (CBG)  Result Value Ref Range   POC Glucose 186 (A) 70 - 99 mg/dl    Last CBC Lab Results  Component Value Date   WBC 6.7 02/02/2022   HGB 15.5 (H) 02/02/2022   HCT 48.0 (H) 02/02/2022   MCV 86.0 02/02/2022   MCH 27.8 02/02/2022   RDW 13.7 02/02/2022   PLT 298 02/02/2022   Last metabolic panel Lab Results  Component Value Date   GLUCOSE 275 (H) 02/02/2022   NA 135 02/02/2022   K 3.2 (L) 02/02/2022   CL 97 (L) 02/02/2022   CO2 30 02/02/2022   BUN 10 02/02/2022   CREATININE 0.73 02/02/2022   GFRNONAA >60 02/02/2022   CALCIUM 9.1 02/02/2022   PHOS 3.6 11/12/2012   PROT 7.4 02/02/2022   ALBUMIN 3.8 02/02/2022   LABGLOB 2.9 01/14/2021   AGRATIO 1.6 01/14/2021   BILITOT 0.6 02/02/2022   ALKPHOS 105 02/02/2022   AST 22  02/02/2022   ALT 19 02/02/2022   ANIONGAP 8 02/02/2022   Last lipids Lab Results  Component Value Date   CHOL 218 (H) 07/31/2020   HDL 78 07/31/2020   LDLCALC 109 (H) 07/31/2020   TRIG 157 (H) 07/31/2020   CHOLHDL 2.8 07/31/2020   Last hemoglobin A1c Lab Results  Component Value Date   HGBA1C 8.4 (A) 07/14/2022      The ASCVD Risk score (Arnett DK, et al., 2019) failed to calculate for the following reasons:   The patient has a prior MI or stroke diagnosis    Assessment & Plan:   1. Type 1 diabetes mellitus without complication (HCC) - Her HgbA1c was 8.4%, her goal should be less than 6%. She will continue on current medication, low carb/non concentrated sweet diet and exercise as tolerated. - POCT HgB A1C - POCT Glucose (CBG) - glucose blood (RIGHTEST GS550 BLOOD GLUCOSE) test strip; USE TO TEST BLOOD GLUCOSE UP TO 4 TIMES PER DAY.  (IN THE MORNING AND AFTER MEALS)  Dispense: 100 strip; Refill: 11 - Rightest GL300 Lancets MISC; USE AS DIRECTED  Dispense: 100 each; Refill: 3  2. Essential hypertension - Her blood pressure is improving, will continue on current medication, DASH diet and exercise as tolerated. - hydrochlorothiazide (HYDRODIURIL) 25 MG tablet; TAKE ONE TABLET BY MOUTH ONCE EVERY DAY.  Dispense: 90 tablet; Refill: 1 - lisinopril (ZESTRIL) 40 MG tablet; TAKE ONE TABLET BY MOUTH ONCE EVERY DAY.  Dispense: 90 tablet; Refill: 1 - Basic Metabolic Panel (BMET); Future - Basic Metabolic Panel (BMET)  3. Generalized anxiety disorder - She will continue on current medication, advised to call the Crisis help line with worsening symptoms. - mirtazapine (REMERON) 15 MG tablet; Take 1 tablet (15 mg total) by mouth once daily at bedtime.  Dispense: 90 tablet; Refill: 0  4. Cerebrovascular accident (CVA), unspecified mechanism (HCC) -She will continue on current medication, low fat/cholesterol diet and exercise as tolerated. - atorvastatin (LIPITOR) 80 MG tablet; TAKE ONE  TABLET BY MOUTH EVERY DAY  Dispense: 90 tablet; Refill: 3  5. Acute low back pain without sciatica, unspecified back pain laterality - Unknown etiology of back pain, will check Urinalysis to rule out UTI, advised to perform back stretching exercise. - Urinalysis; Future - Urinalysis    Return in about 3 months (around 10/14/2022), or if symptoms worsen or fail to improve.    Clotile Whittington Trellis Paganini, NP

## 2022-07-14 NOTE — BH Specialist Note (Signed)
Integrated Behavioral Health Follow Up In-Person Visit  MRN: [id number] Name: Courtney Stafford  Number of [place] Clinician visits: No data recorded Session Start time: No data recorded  Session End time: No data recorded Total time in minutes: No data recorded  Types of Service: Individual psychotherapy  Interpretor:No. Interpretor Name and Language: N/A/  Subjective: Courtney Stafford is a 51 y.o. female accompanied by  herself  Patient was referred by [name] , NP  for mental health. Patient reports the following symptoms/concerns: The patient reports that she has been doing okay since her last follow-up appointment. She shared her frustrations regarding the charity care application process and not knowing the status of her application.  She shared [facility] all she had to do with sign the form because someone was there to help her fill them out.  She noted nail she struggles to complete the documentation on her own. The patient discussed other situational stressors impacting her life currently.  Ms. Kobel stated that she was upset that [employer] bus refused to serve her because [place] [employer] [place] however, the patient stated she is unable to cross a busy laying road with her walker or cane.  The patient noted that she has had some good days and bad days recently and on her bad days she prefers to avoid others and stay in her home. Tacha denied any suicidal homicidal thoughts. Duration of problem: Years; Severity of problem: moderate  Objective: Mood: Euthymic and Affect: Appropriate Risk of harm to self or others: No plan to harm self or others  Life Context: Family and Social: see above School/Work: see above Self-Care: see above Life Changes: see above   Patient and/or Family's Strengths/Protective Factors: Concrete supports in place (healthy food, safe environments, etc.) and Sense of  purpose  Goals Addressed: Patient will:  Reduce symptoms of: agitation, anxiety, depression, insomnia, and stress   Increase knowledge and/or ability of: coping skills, healthy habits, self-management skills, stress reduction, and social skills    Demonstrate ability to: Increase healthy adjustment to current life circumstances  Progress towards Goals: Ongoing  Interventions: Interventions utilized:  CBT Cognitive Behavioral Therapywas utilized by the clinician during today's follow up session. Clinician met with patient to identify needs related to stressors and functioning, and assess and monitor for signs and symptoms of anxiety and depression, and assess safety. The clinician processed with the patient how they have been doing since the last follow-up session. Clinician measured the patient's anxiety and depression on a numerical scale. Clinician encouraged the patient to focus on what was in her control versus what was not in her control. The session ended with scheduling. Standardized Assessments completed: GAD-7 and PHQ 9 PHQ-9= 16 GAD-7= 16  Assessment: Patient currently experiencing see above.   Patient may benefit from see above.  Plan: Follow up with behavioral health clinician on : July 21, 2022 Behavioral recommendations:  Referral(s): [place] (In Clinic) "From scale of 1-10, how likely are you to follow plan?":   Lesli Albee, LCSWA

## 2022-07-15 ENCOUNTER — Ambulatory Visit: Payer: Self-pay | Admitting: Gerontology

## 2022-07-15 ENCOUNTER — Other Ambulatory Visit: Payer: Self-pay

## 2022-07-15 ENCOUNTER — Other Ambulatory Visit: Payer: Self-pay | Admitting: Gerontology

## 2022-07-15 ENCOUNTER — Ambulatory Visit: Payer: Self-pay | Admitting: Licensed Clinical Social Worker

## 2022-07-15 DIAGNOSIS — N390 Urinary tract infection, site not specified: Secondary | ICD-10-CM

## 2022-07-15 LAB — URINALYSIS
Bilirubin, UA: NEGATIVE
Nitrite, UA: POSITIVE — AB
Specific Gravity, UA: 1.016 (ref 1.005–1.030)
Urobilinogen, Ur: 0.2 mg/dL (ref 0.2–1.0)
pH, UA: 5 (ref 5.0–7.5)

## 2022-07-15 LAB — BASIC METABOLIC PANEL
BUN/Creatinine Ratio: 13 (ref 9–23)
BUN: 11 mg/dL (ref 6–24)
CO2: 22 mmol/L (ref 20–29)
Calcium: 9.3 mg/dL (ref 8.7–10.2)
Chloride: 98 mmol/L (ref 96–106)
Creatinine, Ser: 0.88 mg/dL (ref 0.57–1.00)
Glucose: 227 mg/dL — ABNORMAL HIGH (ref 70–99)
Potassium: 3.8 mmol/L (ref 3.5–5.2)
Sodium: 139 mmol/L (ref 134–144)
eGFR: 79 mL/min/{1.73_m2} (ref >59)

## 2022-07-15 MED ORDER — SULFAMETHOXAZOLE-TRIMETHOPRIM 800-160 MG PO TABS
1.0000 | ORAL_TABLET | Freq: Two times a day (BID) | ORAL | 0 refills | Status: DC
  Filled 2022-07-15: qty 14, 7d supply, fill #0

## 2022-07-16 ENCOUNTER — Other Ambulatory Visit: Payer: Self-pay

## 2022-07-21 ENCOUNTER — Ambulatory Visit: Payer: Self-pay | Admitting: Licensed Clinical Social Worker

## 2022-07-21 ENCOUNTER — Ambulatory Visit: Payer: Self-pay

## 2022-07-21 DIAGNOSIS — F4310 Post-traumatic stress disorder, unspecified: Secondary | ICD-10-CM

## 2022-07-21 DIAGNOSIS — F331 Major depressive disorder, recurrent, moderate: Secondary | ICD-10-CM

## 2022-07-21 DIAGNOSIS — F411 Generalized anxiety disorder: Secondary | ICD-10-CM

## 2022-07-21 NOTE — Progress Notes (Unsigned)
Called pt to discuss needs the pt may need. Pt stated the only need she has at the moment is transportation issues. She stated [employer] told her she could not use their bus service because [place] [employer] [place]. I called [employer] and [employer] to get more information. Link provided me with a form for the patient and the doctor to fill out. Will fill out form and send in, If approved pt will be able to ride the [employer] bus all over [place] which helps the pt with her current anxiety of not being able to get places due to the lack of transportation and income. Pt has appt with Herbert Seta today at 4 and will express any other needs if she has any. She stated to me that she only needed help with transportation.

## 2022-07-21 NOTE — BH Specialist Note (Signed)
Integrated Behavioral Health via Telemedicine Visit  08/10/2022 JALESSA PEYSER [id number]  Number of [place] Clinician visits: No data recorded Session Start time: No data recorded  Session End time: No data recorded Total time in minutes: No data recorded  Referring Provider: [name], NP Patient/Family location: The Patient's Home [facility] Provider location: [facility] [place] All persons participating in visit: Soma Bachand. [employer], LCSW-A Types of Service: Telephone visit  I connected with [name] via Telephone or Video Enabled Telemedicine Application  (Video is Caregility application) and verified that I am speaking with the correct person using two identifiers. Discussed confidentiality: Yes   I discussed the limitations of telemedicine and the availability of in person appointments.  Discussed there is a possibility of technology failure and discussed alternative modes of communication if that failure occurs.  I discussed that engaging in this telemedicine visit, they consent to the provision of behavioral healthcare and the services will be billed under their insurance.  Patient and/or legal guardian expressed understanding and consented to Telemedicine visit: Yes   Presenting Concerns: Patient and/or family reports the following symptoms/concerns: The patient reports that she has been doing about the same since her last follow-up appointment.  The patient discussed situational stressors impacting her life currently.  She shared that she has good days and bad days.  Marra noted that on her bad days she prefers to play games on her phone watch television and stay in her house.  She shared her concerns regarding transportation and how this would impact her health.  The patient noted she would like to continue physical therapy and found it to be helpful in regaining her mobility following a stroke.  She noted that she  struggled to complete paperwork and is unsure if her charity care application has been processed.  The patient noted that she enjoyed taking care of her grandson over the summer and hopes to see them at Christmas.  The patient denied any suicidal or homicidal thoughts. Duration of problem: Years; Severity of problem: moderate  Patient and/or Family's Strengths/Protective Factors: Concrete supports in place (healthy food, safe environments, etc.), Sense of purpose, and Physical Health (exercise, healthy diet, medication compliance, etc.)  Goals Addressed: Patient will:  Reduce symptoms of: agitation, anxiety, depression, insomnia, and stress   Increase knowledge and/or ability of: coping skills, healthy habits, self-management skills, and stress reduction   Demonstrate ability to: Increase healthy adjustment to current life circumstances  Progress towards Goals: Ongoing  Interventions: Interventions utilized:  CBT Cognitive Behavioral Therapy and Link to [employer] utilized by the clinician during today's follow up session. Clinician met with patient to identify needs related to stressors and functioning, and assess and monitor for signs and symptoms of anxiety and depression, and assess safety. The clinician processed with the patient how they have been doing since the last follow-up session.Clinician measured the patient's anxiety and depression on a numerical scale.  Clinician offered the patient the number for [place] financial department so that she may check on the status of her charity care application. Further, the clinician offered to connect the patient to [facility] [place] [facility] to discuss transportation for her upcoming appointments.The clinician encouraged the patient to utilize their coping skills to deal with their current life circumstances. The session ended with scheduling. Standardized Assessments completed: GAD-7 and PHQ  9 GAD-7=16 PHQ-9=17  Assessment: Patient currently experiencing see above.   Patient may benefit from  see above.  Plan: Follow up with behavioral health clinician on : 07/27/2022 at 4:00 pm  Behavioral recommendations:  Referral(s): [place] (In Clinic)  I discussed the assessment and treatment plan with the patient and/or parent/guardian. They were provided an opportunity to ask questions and all were answered. They agreed with the plan and demonstrated an understanding of the instructions.   They were advised to call back or seek an in-person evaluation if the symptoms worsen or if the condition fails to improve as anticipated.  Lesli Albee, LCSWA

## 2022-07-27 ENCOUNTER — Other Ambulatory Visit: Payer: Self-pay

## 2022-07-27 ENCOUNTER — Ambulatory Visit: Payer: Self-pay | Admitting: Licensed Clinical Social Worker

## 2022-08-03 ENCOUNTER — Other Ambulatory Visit: Payer: Self-pay

## 2022-08-03 ENCOUNTER — Ambulatory Visit: Payer: Self-pay | Admitting: Licensed Clinical Social Worker

## 2022-08-03 DIAGNOSIS — F4310 Post-traumatic stress disorder, unspecified: Secondary | ICD-10-CM

## 2022-08-03 DIAGNOSIS — F331 Major depressive disorder, recurrent, moderate: Secondary | ICD-10-CM

## 2022-08-03 DIAGNOSIS — F411 Generalized anxiety disorder: Secondary | ICD-10-CM

## 2022-08-03 NOTE — BH Specialist Note (Signed)
Integrated Behavioral Health via Telemedicine Visit  08/03/2022 AASHIKA CARTA [id number]  Number of [place] Clinician visits: No data recorded Session Start time: No data recorded  Session End time: No data recorded Total time in minutes: No data recorded  Referring Provider: [name], NP Patient/Family location: The Patient's Home Address [facility] Provider location: [facility] All persons participating in visit: Farron Lafond and [name], LCSW-A Types of Service: Telephone visit  I connected with [name]  via  Telephone or Video Enabled Telemedicine Application  (Video is Caregility application) and verified that I am speaking with the correct person using two identifiers. Discussed confidentiality: Yes   I discussed the limitations of telemedicine and the availability of in person appointments.  Discussed there is a possibility of technology failure and discussed alternative modes of communication if that failure occurs.  I discussed that engaging in this telemedicine visit, they consent to the provision of behavioral healthcare and the services will be billed under their insurance.  Patient and/or legal guardian expressed understanding and consented to Telemedicine visit: Yes   Presenting Concerns: Patient and/or family reports the following symptoms/concerns: The patient reports that she has been doing okay since her last follow-up appointment. She shared that she has been experiencing  sharp foot pain especially while walking to the store.  The patient shared that prior to her stroke she could complete a walking trip to and from the store in less than 30 minutes.  Now Valyn explained that it takes her over an hour and she has to sit down several times in between to rest.  The patient discussed worrying about transportation to and from her doctors appointments or to the pharmacy to pick up her medications most of her  day. She explained that she feels anxious and upset up to 2 weeks before an appointment even if she has been reassured that transportation has been arranged. The patient noted that she often is afraid that she will be left in a location and unable to get back home so she prefers to stay home if at all possible.  Bethannie stated that even when she babysat her grandson a few days over the summer she was worried that she would not be able to get a ride home even though her daughter was providing her transportation.  The patient asked the clinician to check on transportation for [facility].  She noted she was waiting to hear back from [place] sure, [place] regarding her transportation options but was struggling to not think the worst such as being denied for transportation.  Alaine discussed feeling uncomfortable in social situations and noted her last friend moved away several years ago.  The patient stated she continues to take mirtazapine 15 mg nightly as prescribed unless she has to wake up early in the morning then she will skip her dose because she is afraid she will not wake up on time.  The patient noted that she has been feeling depressed or down more than half the days in a week and struggles with sleep difficulties, low energy levels, difficulty concentrating, and poor appetite daily. The patient denied any suicidal or homicidal thoughts. Duration of problem: Years; Severity of problem: moderate  Patient and/or Family's Strengths/Protective Factors: Concrete supports in place (healthy food, safe environments, etc.), Sense of purpose, and Physical Health (exercise, healthy diet, medication compliance, etc.)  Goals Addressed: Patient will:  Reduce symptoms  of: agitation, anxiety, depression, insomnia, and stress   Increase knowledge and/or ability of: coping skills and healthy habits   Demonstrate ability to: Increase healthy adjustment to current life  circumstances and Increase adequate support systems for patient/family  Progress towards Goals: Ongoing  Interventions: Interventions utilized:  CBT Cognitive Behavioral Therapy was utilized by the clinician during today's follow up session. Clinician met with patient to identify needs related to stressors and functioning, and assess and monitor for signs and symptoms of anxiety and depression, and assess safety. The clinician processed with the patient how they have been doing since the last follow-up session. Clinician measured the patient's anxiety and depression on a numerical scale. The Clinician challenged the client's persistent fears by providing her with a more balanced, realistic perspective that was incompatible with anxiety-producing views. Clinician determined the patient tended to reject any challenges to their anxious perspective and provided the patient with remedial feedback in this area. The session ended with scheduling.  Standardized Assessments completed: GAD-7 and PHQ 9 GAD-7= 17 PHQ-9= 17  Assessment: Patient currently experiencing see above.   Patient may benefit from see above.  Plan: Follow up with behavioral health clinician on : Tuesday August 17, 2022 at 3:00 PM  Behavioral recommendations:  Referral(s): [place] (In Clinic)  I discussed the assessment and treatment plan with the patient and/or parent/guardian. They were provided an opportunity to ask questions and all were answered. They agreed with the plan and demonstrated an understanding of the instructions.   They were advised to call back or seek an in-person evaluation if the symptoms worsen or if the condition fails to improve as anticipated.  Lesli Albee, LCSWA

## 2022-08-04 ENCOUNTER — Other Ambulatory Visit: Payer: Self-pay

## 2022-08-05 ENCOUNTER — Other Ambulatory Visit: Payer: Self-pay

## 2022-08-06 ENCOUNTER — Other Ambulatory Visit: Payer: Self-pay

## 2022-08-09 ENCOUNTER — Other Ambulatory Visit: Payer: Self-pay

## 2022-08-11 ENCOUNTER — Other Ambulatory Visit: Payer: Self-pay | Admitting: Gerontology

## 2022-08-11 ENCOUNTER — Other Ambulatory Visit: Payer: Self-pay

## 2022-08-12 ENCOUNTER — Other Ambulatory Visit: Payer: Self-pay | Admitting: Pharmacy Technician

## 2022-08-12 ENCOUNTER — Other Ambulatory Visit: Payer: Self-pay | Admitting: Gerontology

## 2022-08-12 ENCOUNTER — Other Ambulatory Visit: Payer: Self-pay

## 2022-08-12 DIAGNOSIS — E109 Type 1 diabetes mellitus without complications: Secondary | ICD-10-CM

## 2022-08-12 MED ORDER — NOVOFINE PEN NEEDLE 32G X 6 MM MISC
3 refills | Status: DC
  Filled 2022-08-12: qty 400, 100d supply, fill #0
  Filled 2022-12-10: qty 400, 200d supply, fill #1
  Filled 2022-12-10: qty 400, 100d supply, fill #1

## 2022-08-12 MED ORDER — NOVOLOG FLEXPEN 100 UNIT/ML ~~LOC~~ SOPN
1.0000 [IU] | PEN_INJECTOR | Freq: Every day | SUBCUTANEOUS | 0 refills | Status: DC | PRN
  Filled 2022-08-12: qty 15, fill #0

## 2022-08-12 MED ORDER — GLUCOSE BLOOD VI STRP
ORAL_STRIP | 11 refills | Status: DC
  Filled 2022-08-12: qty 500, 83d supply, fill #0

## 2022-08-12 MED ORDER — NOVOLOG FLEXPEN 100 UNIT/ML ~~LOC~~ SOPN
5.0000 [IU] | PEN_INJECTOR | Freq: Three times a day (TID) | SUBCUTANEOUS | 3 refills | Status: DC
  Filled 2022-08-12: qty 75, 250d supply, fill #0
  Filled 2022-12-10: qty 90, 90d supply, fill #0
  Filled 2023-05-11: qty 30, 30d supply, fill #1

## 2022-08-12 MED ORDER — RIGHTEST GL300 LANCETS MISC
11 refills | Status: DC
  Filled 2022-08-12: qty 500, 83d supply, fill #0
  Filled 2023-02-01: qty 600, 90d supply, fill #1
  Filled 2023-04-28: qty 100, 16d supply, fill #2

## 2022-08-12 MED ORDER — BLOOD GLUCOSE MONITOR SYSTEM W/DEVICE KIT
PACK | 0 refills | Status: DC
  Filled 2022-08-12: qty 1, fill #0

## 2022-08-12 NOTE — Patient Outreach (Signed)
Patient has medications ready for pick-up at [place] at [facility].  Patient does not live in an area that provides [place].  Patient does not have family or friends to bring her to pick-up her medications.  Ride coordinated to and from the clinic for Friday, September 29. 2023 using [facility] [place].  Jacquelynn Cree Patient Advocate Specialist [place] at [facility]

## 2022-08-13 ENCOUNTER — Other Ambulatory Visit: Payer: Self-pay

## 2022-08-17 ENCOUNTER — Telehealth: Payer: Self-pay | Admitting: Licensed Clinical Social Worker

## 2022-08-17 ENCOUNTER — Ambulatory Visit: Payer: Self-pay | Admitting: Licensed Clinical Social Worker

## 2022-08-17 NOTE — Telephone Encounter (Signed)
Called pt to inform her that her appointment was rescheduled to 08/24/2022 at 4:00 PM

## 2022-08-18 ENCOUNTER — Other Ambulatory Visit: Payer: Self-pay

## 2022-08-18 DIAGNOSIS — N390 Urinary tract infection, site not specified: Secondary | ICD-10-CM

## 2022-08-22 LAB — MICROSCOPIC EXAMINATION
Casts: NONE SEEN /lpf
Epithelial Cells (non renal): >10 /hpf — AB (ref 0–10)
WBC, UA: >30 /hpf — AB (ref 0–5)

## 2022-08-22 LAB — UA/M W/RFLX CULTURE, ROUTINE
Bilirubin, UA: NEGATIVE
Glucose, UA: NEGATIVE
Ketones, UA: NEGATIVE
Nitrite, UA: POSITIVE — AB
RBC, UA: NEGATIVE
Specific Gravity, UA: 1.021 (ref 1.005–1.030)
Urobilinogen, Ur: 0.2 mg/dL (ref 0.2–1.0)
pH, UA: 5 (ref 5.0–7.5)

## 2022-08-22 LAB — URINE CULTURE, REFLEX

## 2022-08-24 ENCOUNTER — Ambulatory Visit: Payer: Self-pay | Admitting: Licensed Clinical Social Worker

## 2022-08-24 ENCOUNTER — Other Ambulatory Visit: Payer: Self-pay | Admitting: Emergency Medicine

## 2022-08-24 ENCOUNTER — Other Ambulatory Visit: Payer: Self-pay

## 2022-08-24 DIAGNOSIS — F4310 Post-traumatic stress disorder, unspecified: Secondary | ICD-10-CM

## 2022-08-24 DIAGNOSIS — F331 Major depressive disorder, recurrent, moderate: Secondary | ICD-10-CM

## 2022-08-24 DIAGNOSIS — N390 Urinary tract infection, site not specified: Secondary | ICD-10-CM

## 2022-08-24 DIAGNOSIS — F411 Generalized anxiety disorder: Secondary | ICD-10-CM

## 2022-08-24 MED ORDER — SULFAMETHOXAZOLE-TRIMETHOPRIM 800-160 MG PO TABS
1.0000 | ORAL_TABLET | Freq: Two times a day (BID) | ORAL | 0 refills | Status: DC
  Filled 2022-08-24: qty 14, 7d supply, fill #0

## 2022-08-24 NOTE — BH Specialist Note (Signed)
Integrated Behavioral Health via Telemedicine Visit  08/24/2022 Courtney Stafford [id number]  Number of [place] Clinician visits: No data recorded Session Start time: No data recorded  Session End time: No data recorded Total time in minutes: No data recorded  Referring Provider: [name], NP Patient/Family location: The Patient's Home [facility] Provider location: [facility] [place] All persons participating in visit: Courtney Stafford and Courtney Cairo, LCSW-A Types of Service: Telephone visit  I connected with Courtney Stafford via  Telephone or Video Enabled Telemedicine Application  (Video is Caregility application) and verified that I am speaking with the correct person using two identifiers. Discussed confidentiality: Yes   I discussed the limitations of telemedicine and the availability of in person appointments.  Discussed there is a possibility of technology failure and discussed alternative modes of communication if that failure occurs.  Patient and/or legal guardian expressed understanding and consented to Telemedicine visit: Yes   Presenting Concerns: Patient and/or family reports the following symptoms/concerns: The patient reports that she has been doing the same since her last follow up appointment. She explained that her late son's birthday is October 25th and she has been feeling more down lately.  The patient noted that she spends a lot of time thinking about her son and tends to stay in her house avoiding social interactions.  She stated that she is not comfortable talking to her daughter and has nobody else to talk to about her feelings because she does not want to burden others.  The patient stated last year she tried to distract herself by watching TV and playing games on her phone however, the patient noted that it was not very effective.  [name] reported that she continues to struggle with falling asleep and often does not go to  bed until the morning.  The patient explained that she does not take her mirtazapine to help her with sleep if she has an appointment because she is afraid she will oversleep.  [name] noted that she was confused about her follow-up appointment with her primary care provider regarding a urinary tract infection and asked the clinician if she can message her provider to see if she needed a follow-up appointment.  She discussed other situational stressors impacting her life currently.  The patient denied any suicidal or homicidal thoughts. Duration of problem: Years; Severity of problem: moderate  Patient and/or Family's Strengths/Protective Factors: Concrete supports in place (healthy food, safe environments, etc.), Sense of purpose, and Physical Health (exercise, healthy diet, medication compliance, etc.)  Goals Addressed: Patient will:  Reduce symptoms of: agitation, anxiety, depression, insomnia, and stress   Increase knowledge and/or ability of: coping skills, healthy habits, self-management skills, and stress reduction   Demonstrate ability to: Increase healthy adjustment to current life circumstances and Increase adequate support systems for patient/family  Progress towards Goals: Ongoing  Interventions: Interventions utilized:  CBT Cognitive Behavioral Therapywas utilized by the clinician during today's follow up session. Clinician met with patient to identify needs related to stressors and functioning, and assess and monitor for signs and symptoms of anxiety and depression, and assess safety. The clinician processed with the patient how they have been doing since the last follow-up session. Clinician measured the patient's anxiety and depression on a numerical scale. Clinician provided a safe judgement-free space for the patient to ventilate her frustrations and feelings. Clinician provided Psychoeducation to the patient normalizing that grief is a natural response to the loss of a loved  one  and special occassions like birthdays can intensify these feelings. Clinician collaborated with the patient to set an agenda for the next session to develop additional strategies for managing her grief. Clinician relayed the client's transportation and appointment concerns to the PCP. The session ended with scheduling.  Standardized Assessments completed: GAD-7 and PHQ 9 GAD-7= 16 PHQ-9= 19  Patient and/or Family Response: The patient stated, "okay."  Assessment: Patient currently experiencing see above.   Patient may benefit from see above.  Plan: Follow up with behavioral health clinician on : Thursday, September 02, 2022 at 3:00 PM. Behavioral recommendations:  Referral(s): [place] (In Clinic)  I discussed the assessment and treatment plan with the patient and/or parent/guardian. They were provided an opportunity to ask questions and all were answered. They agreed with the plan and demonstrated an understanding of the instructions.   They were advised to call back or seek an in-person evaluation if the symptoms worsen or if the condition fails to improve as anticipated.  Lesli Albee, LCSWA

## 2022-08-24 NOTE — Progress Notes (Signed)
Patient returned Courtney Stafford's phone call from last week. Appears patient has a UTI. Sent refill for Bactrim DS. Culture shows sensitivity to sulfa medications.

## 2022-09-02 ENCOUNTER — Ambulatory Visit: Payer: Self-pay | Admitting: Licensed Clinical Social Worker

## 2022-09-02 ENCOUNTER — Other Ambulatory Visit: Payer: Self-pay

## 2022-09-02 MED ORDER — NOVOFINE PEN NEEDLE 32G X 6 MM MISC
3 refills | Status: DC
  Filled 2023-02-01: qty 400, 100d supply, fill #0

## 2022-09-02 MED ORDER — INSULIN ASPART 100 UNIT/ML FLEXPEN: PEN_INJECTOR | SUBCUTANEOUS | 3 refills | Status: DC

## 2022-09-07 ENCOUNTER — Other Ambulatory Visit: Payer: Self-pay

## 2022-09-08 ENCOUNTER — Ambulatory Visit: Payer: Self-pay | Admitting: Licensed Clinical Social Worker

## 2022-09-08 DIAGNOSIS — F411 Generalized anxiety disorder: Secondary | ICD-10-CM

## 2022-09-08 DIAGNOSIS — F331 Major depressive disorder, recurrent, moderate: Secondary | ICD-10-CM

## 2022-09-08 DIAGNOSIS — F4310 Post-traumatic stress disorder, unspecified: Secondary | ICD-10-CM

## 2022-09-08 NOTE — BH Specialist Note (Signed)
Integrated Behavioral Health via Telemedicine Visit  09/08/2022 ARLEN LEGENDRE [id number]  Number of [place] Clinician visits: No data recorded Session Start time: No data recorded  Session End time: No data recorded Total time in minutes: No data recorded  Referring Provider: [name], NP Patient/Family location: The patient's home [facility] Provider [facility]: [facility] [place] All persons participating in visit: Jackeline Gutknecht. [employer], LCSW-A Types of Service: Telephone visit  I connected with [name] via  Telephone or Video Enabled Telemedicine Application  (Video is Caregility application) and verified that I am speaking with the correct person using two identifiers. Discussed confidentiality: Yes   I discussed the limitations of telemedicine and the availability of in person appointments.  Discussed there is a possibility of technology failure and discussed alternative modes of communication if that failure occurs.  Patient and/or legal guardian expressed understanding and consented to Telemedicine visit: Yes   Presenting Concerns: Patient and/or family reports the following symptoms/concerns: The patient reports that she has been doing okay since her last follow-up visit.  She shared that today is the birthday of her late son.  Annamae noted that she typically spends this day feeling down and avoiding others.  She stated that today she has been distracting herself from feeling down by watching television and playing games on her phone.  The patient shared that she recently lost all the telephone numbers stored in her phone and no longer had her daughter's number.  She asked that the clinician could look in her file to see if her daughter's number was listed in her emergency contacts.  The patient stated she thought she knew where her daughter worked but did not want to bother her and hoped that her daughter would call her 1  day so she can get her phone number.  The patient shared that she has been doing better physically.  She stated that she was going to try to walk to the store this afternoon since the weather was so nice.  Elvis discussed her upcoming [facility] in the evening and noted that typically these appointments are done during the day because she lacks transportation.  The patient stated that no one had informed her of this appointment and she requested the clinician advocate on her behalf.  The patient also indicated that she had an appointment on November 29 with her primary care provider and requested transportation assistance.  Georgean stated that she continues to take mirtazapine 15 mg daily at bedtime and requested a refill and transportation to pick up her medication from [place].  The patient denied any suicidal or homicidal thoughts. Duration of problem: Years; Severity of problem: moderate  Patient and/or Family's Strengths/Protective Factors: Concrete supports in place (healthy food, safe environments, etc.), Sense of purpose, and Physical Health (exercise, healthy diet, medication compliance, etc.)  Goals Addressed: Patient will:  Reduce symptoms of: agitation, anxiety, depression, insomnia, and stress   Increase knowledge and/or ability of: coping skills, healthy habits, self-management skills, and stress reduction   Demonstrate ability to: Increase healthy adjustment to current life circumstances  Progress towards Goals: Ongoing  Interventions: Interventions utilized:  CBT Cognitive Behavioral Therapywas utilized by the clinician during today's follow up session. Clinician met with patient to identify needs related to stressors and functioning, and assess and monitor for signs and symptoms of anxiety and depression, and assess safety. The clinician processed with the patient  how they have been doing since the last  follow-up session. Clinician measured the patient's anxiety and depression on a numerical scale.  Clinician offered to message the patient's primary care provider, [name], NP regarding the patient's medication and endocrinology request.  Additionally the clinician contacted [facility], social determinants of health coordinator [facility] regarding the patient's transportation request. Clinician continued to teach the patient calming relaxation and mindfulness skills and how to discriminate better between relaxation and tension and taught the client how to apply the skills in their daily life. Clinician assigned the patient homework in which they will use mindfulness skills daily gradually applying them progressively from not anxiety provoking to anxiety provoking situations and worked with the patient to resolve obstacles toward sustained implementation.   Standardized Assessments completed: GAD-7 and PHQ 9 GAD-7=18 PHQ-9= 15  Assessment: Patient currently experiencing see above.   Patient may benefit from see above.  Plan: Follow up with behavioral health clinician on : 09/22/2021 at 2:00 PM  Behavioral recommendations:  Referral(s): [place] (In Clinic)  I discussed the assessment and treatment plan with the patient and/or parent/guardian. They were provided an opportunity to ask questions and all were answered. They agreed with the plan and demonstrated an understanding of the instructions.   They were advised to call back or seek an in-person evaluation if the symptoms worsen or if the condition fails to improve as anticipated.  Lesli Albee, LCSWA

## 2022-09-13 ENCOUNTER — Other Ambulatory Visit: Payer: Self-pay

## 2022-09-22 ENCOUNTER — Ambulatory Visit: Payer: Self-pay | Admitting: Licensed Clinical Social Worker

## 2022-09-22 ENCOUNTER — Telehealth: Payer: Self-pay | Admitting: Gerontology

## 2022-09-22 DIAGNOSIS — F4310 Post-traumatic stress disorder, unspecified: Secondary | ICD-10-CM

## 2022-09-22 DIAGNOSIS — F331 Major depressive disorder, recurrent, moderate: Secondary | ICD-10-CM

## 2022-09-22 DIAGNOSIS — F411 Generalized anxiety disorder: Secondary | ICD-10-CM

## 2022-09-22 NOTE — BH Specialist Note (Signed)
Integrated Behavioral Health via Telemedicine Visit  09/22/2022 Courtney Stafford [id number]  Number of [place] Clinician visits: No data recorded Session Start time: No data recorded  Session End time: No data recorded Total time in minutes: No data recorded  Referring Provider: [name], NP Patient/Family location: The Patient's Home [facility] Provider location: [facility] [place] All persons participating in visit: Courtney Stafford and Courtney Cairo, LCSW-A Types of Service: Telephone visit  I connected with [name] via  Telephone or Video Enabled Telemedicine Application  (Video is Caregility application) and verified that I am speaking with the correct person using two identifiers. Discussed confidentiality: Yes   I discussed the limitations of telemedicine and the availability of in person appointments.  Discussed there is a possibility of technology failure and discussed alternative modes of communication if that failure occurs.  Patient and/or legal guardian expressed understanding and consented to Telemedicine visit: Yes   Presenting Concerns: Patient and/or family reports the following symptoms/concerns: The patient reported that she has been doing the same since her last follow-up visit.  [name] discussed situational stressors impacting her life currently.  She shared that she recently started to have difficulties with her phone disconnecting and not working properly.  The patient noted that [facility] her [facility] this phone and she is concerned that she will not be able to replace that.  The patient noted that she has been having some good days and bad days in regards to her mood.  She shared that on her bad days she likes to be alone and on her good days she tries to walk a short distance to get out of her house for a minute.  [name] noted that she recently reconnected with her daughter.  The patient denied any  suicidal or homicidal thoughts. Duration of problem: Years; Severity of problem: moderate  Patient and/or Family's Strengths/Protective Factors: Concrete supports in place (healthy food, safe environments, etc.), Sense of purpose, and Physical Health (exercise, healthy diet, medication compliance, etc.)  Goals Addressed: Patient will:  Reduce symptoms of: agitation, anxiety, depression, insomnia, and stress   Increase knowledge and/or ability of: coping skills, healthy habits, self-management skills, and stress reduction   Demonstrate ability to: Increase healthy adjustment to current life circumstances  Progress towards Goals: Ongoing  Interventions: Interventions utilized:  CBT Cognitive Behavioral Therapywas utilized by the clinician during today's follow up session. Clinician met with patient to identify needs related to stressors and functioning, and assess and monitor for signs and symptoms of anxiety and depression, and assess safety. The clinician processed with the patient how they have been doing since the last follow-up session.  Clinician measured the patient's anxiety and depression on a numerical scale. Clinician intervened with positive regard and optimism to validate client's emotions, and supported client in exploring ways to increase her social and community supports.  The clinician informed the patient that she was approved for transportation through [employer] and provided her with instructions on how to schedule rides.  The session ended with scheduling. Standardized Assessments completed: GAD-7 and PHQ 9 GAD-7=18 PHQ-9=18   Assessment: Patient currently experiencing see above.   Patient may benefit from see above.  Plan: Follow up with behavioral health clinician on : 09/29/2022 at 4:00 PM  Behavioral recommendations:  Referral(s): [place] (In Clinic)  I discussed the assessment and treatment plan with the patient and/or  parent/guardian. They were provided an opportunity to ask questions and  all were answered. They agreed with the plan and demonstrated an understanding of the instructions.   They were advised to call back or seek an in-person evaluation if the symptoms worsen or if the condition fails to improve as anticipated.  Lesli Albee, LCSWA

## 2022-09-22 NOTE — Telephone Encounter (Signed)
Called pt to let her know she was [place] [employer] [place] to come pick her up from her house anytime. Client # [id number]. No answer left msg.

## 2022-09-29 ENCOUNTER — Ambulatory Visit: Payer: Self-pay | Admitting: Licensed Clinical Social Worker

## 2022-09-29 DIAGNOSIS — F331 Major depressive disorder, recurrent, moderate: Secondary | ICD-10-CM

## 2022-09-29 DIAGNOSIS — F4310 Post-traumatic stress disorder, unspecified: Secondary | ICD-10-CM

## 2022-09-29 DIAGNOSIS — F411 Generalized anxiety disorder: Secondary | ICD-10-CM

## 2022-09-29 NOTE — BH Specialist Note (Signed)
Integrated Behavioral Health via Telemedicine Visit  09/29/2022 Courtney Stafford [id number]  Number of [place] Clinician visits: No data recorded Session Start time: No data recorded  Session End time: No data recorded Total time in minutes: No data recorded  Referring Provider: [name], Np Patient/Family location: The patient's home  [facility] Provider location: [facility] [place] All persons participating in visit: [name]. [name] and [name], LCSWA Types of Service: Telephone visit  I connected with Courtney Stafford via  Telephone or Video Enabled Telemedicine Application  (Video is Caregility application) and verified that I am speaking with the correct person using two identifiers. Discussed confidentiality: Yes   I discussed the limitations of telemedicine and the availability of in person appointments.  Discussed there is a possibility of technology failure and discussed alternative modes of communication if that failure occurs.  Patient and/or legal guardian expressed understanding and consented to Telemedicine visit: Yes   Presenting Concerns: Patient and/or family reports the following symptoms/concerns: The patient reports that she has been doing the same since her last follow-up appointment. She noted that she has been spending most of her time watching television or playing games on her phone. She explained that she has some days that are harder than others regarding her mood and noted on hard days she tends to sleep more and wait for the difficult emotions to pass. The patient discussed her daughter and grandchildren and noted that she was not sure what she would be doing for Thanksgiving. She shared that sometimes she spend sit with her daughter but has not spoken to her recently. The patient noted overall she is doing well. The patient denied any suicidal or homicidal thoughts.  Duration of problem: Years; Severity of problem:  moderate  Patient and/or Family's Strengths/Protective Factors: Concrete supports in place (healthy food, safe environments, etc.), Sense of purpose, and Physical Health (exercise, healthy diet, medication compliance, etc.)  Goals Addressed: Patient will:  Reduce symptoms of: agitation, anxiety, depression, and stress   Increase knowledge and/or ability of: coping skills, healthy habits, self-management skills, and stress reduction   Demonstrate ability to: Increase healthy adjustment to current life circumstances  Progress towards Goals: Ongoing  Interventions: Interventions utilized:  CBT Cognitive Behavioral Therapywas utilized by the clinician during today's follow up session. Clinician met with patient to identify needs related to stressors and functioning, and assess and monitor for signs and symptoms of anxiety and depression, and assess safety. The clinician processed with the patient how they have been doing since the last follow-up session. Clinician provided a safe judgement free space for the patient to vent her frustrations regarding her current life circumstances. Clinician encouraged the patient to reach out to her daughter increase her social supports. The session ended with scheduling.  Standardized Assessments completed: GAD-7 and PHQ 9 GAD-7=18 PHQ-9= 17   Assessment: Patient currently experiencing see above.   Patient may benefit from see above.  Plan: Follow up with behavioral health clinician on :  Behavioral recommendations:  Referral(s): [facility] (In Clinic)  I discussed the assessment and treatment plan with the patient and/or parent/guardian. They were provided an opportunity to ask questions and all were answered. They agreed with the plan and demonstrated an understanding of the instructions.   They were advised to call back or seek an in-person evaluation if the symptoms worsen or if the condition fails to improve as  anticipated.  Lesli Albee, LCSWA

## 2022-10-05 ENCOUNTER — Ambulatory Visit: Payer: Self-pay

## 2022-10-13 ENCOUNTER — Ambulatory Visit: Payer: Self-pay | Admitting: Gerontology

## 2022-10-14 ENCOUNTER — Ambulatory Visit: Payer: Self-pay | Admitting: Licensed Clinical Social Worker

## 2022-10-15 ENCOUNTER — Other Ambulatory Visit: Payer: Self-pay

## 2022-10-19 ENCOUNTER — Ambulatory Visit: Payer: Self-pay | Admitting: Licensed Clinical Social Worker

## 2022-10-19 ENCOUNTER — Other Ambulatory Visit: Payer: Self-pay

## 2022-10-19 DIAGNOSIS — F411 Generalized anxiety disorder: Secondary | ICD-10-CM

## 2022-10-19 DIAGNOSIS — F4310 Post-traumatic stress disorder, unspecified: Secondary | ICD-10-CM

## 2022-10-19 DIAGNOSIS — F331 Major depressive disorder, recurrent, moderate: Secondary | ICD-10-CM

## 2022-10-19 MED ORDER — MIRTAZAPINE 15 MG PO TABS
15.0000 mg | ORAL_TABLET | Freq: Every day | ORAL | 0 refills | Status: DC
  Filled 2022-10-19: qty 90, 90d supply, fill #0

## 2022-10-19 NOTE — BH Specialist Note (Signed)
Integrated Behavioral Health via Telemedicine Visit  10/19/2022 Courtney Stafford [id number]  Number of Integrated Behavioral Health Clinician visits: No data recorded Session Start time: No data recorded  Session End time: No data recorded Total time in minutes: No data recorded  Referring Provider: Hurman Horn, NP Patient/Family location: The Patient's Home [facility] Provider location: [facility] [place]  All persons participating in visit: Marva Panda and Rhett Bannister, LCSW-A Types of Service: Telephone visit  I connected with [name] via  Telephone or Video Enabled Telemedicine Application  (Video is Caregility application) and verified that I am speaking with the correct person using two identifiers. Discussed confidentiality: Yes   I discussed the limitations of telemedicine and the availability of in person appointments.  Discussed there is a possibility of technology failure and discussed alternative modes of communication if that failure occurs.  I discussed that engaging in this telemedicine visit, they consent to the provision of behavioral healthcare and the services will be billed under their insurance.  Patient and/or legal guardian expressed understanding and consented to Telemedicine visit: Yes   Presenting Concerns: Patient and/or family reports the following symptoms/concerns: The patient reports that she has been doing okay since her last follow up session. She shared  that her daughter and oldest grandchild brought her a plate for Thanksgiving. The patient explained that she missed her last appointment because her blood sugar dropped and she was too sick to come in. She noted that she will talk to her primary care provider about her sugars and what do do if the drop. The patient's phone disconnected the call.  Duration of problem: Years; Severity of problem: moderate  Patient and/or Family's Strengths/Protective Factors: Concrete supports in place  (healthy food, safe environments, etc.), Sense of purpose, and Physical Health (exercise, healthy diet, medication compliance, etc.)  Goals Addressed: Patient will:  Reduce symptoms of: agitation, anxiety, depression, insomnia, and stress   Increase knowledge and/or ability of: coping skills, healthy habits, self-management skills, and stress reduction   Demonstrate ability to: Increase healthy adjustment to current life circumstances and Increase adequate support systems for patient/family  Progress towards Goals: Ongoing  Interventions: Interventions utilized:  Supportive Counseling was utilized during today's session. Clinician attempted to call patient back after the phone session was disconnected; no answer; Clinician left a voice mail with the clinic's contact information as well as the patient rescheduled appointment time.  Standardized Assessments completed:  Patient was disconnected; Clinician will  complete at next follow-up    Assessment: Patient currently experiencing see above.   Patient may benefit from see above.  Plan: Follow up with behavioral health clinician on : 10/28/2022 at 4:00 PM Behavioral recommendations:  Referral(s): Integrated [facility] [facility] (In Clinic)  I discussed the assessment and treatment plan with the patient and/or parent/guardian. They were provided an opportunity to ask questions and all were answered. They agreed with the plan and demonstrated an understanding of the instructions.   They were advised to call back or seek an in-person evaluation if the symptoms worsen or if the condition fails to improve as anticipated.  Judith Part, LCSWA

## 2022-10-19 NOTE — Progress Notes (Signed)
Sent in refill for pt 

## 2022-10-20 ENCOUNTER — Other Ambulatory Visit: Payer: Self-pay

## 2022-10-21 ENCOUNTER — Other Ambulatory Visit: Payer: Self-pay

## 2022-10-26 ENCOUNTER — Ambulatory Visit: Payer: Self-pay | Admitting: Licensed Clinical Social Worker

## 2022-10-27 ENCOUNTER — Ambulatory Visit: Payer: Self-pay | Admitting: Gerontology

## 2022-10-27 ENCOUNTER — Encounter: Payer: Self-pay | Admitting: Pharmacist

## 2022-10-27 ENCOUNTER — Other Ambulatory Visit: Payer: Self-pay

## 2022-10-27 ENCOUNTER — Encounter: Payer: Self-pay | Admitting: Gerontology

## 2022-10-27 ENCOUNTER — Other Ambulatory Visit: Payer: Self-pay | Admitting: Pharmacy Technician

## 2022-10-27 VITALS — BP 179/97 | HR 88 | Wt 197.3 lb

## 2022-10-27 DIAGNOSIS — R8290 Unspecified abnormal findings in urine: Secondary | ICD-10-CM

## 2022-10-27 DIAGNOSIS — I10 Essential (primary) hypertension: Secondary | ICD-10-CM

## 2022-10-27 DIAGNOSIS — E109 Type 1 diabetes mellitus without complications: Secondary | ICD-10-CM

## 2022-10-27 LAB — POCT GLYCOSYLATED HEMOGLOBIN (HGB A1C): Hemoglobin A1C: 8.9 % — AB (ref 4.0–5.6)

## 2022-10-27 LAB — GLUCOSE, POCT (MANUAL RESULT ENTRY): POC Glucose: 201 mg/dl — AB (ref 70–99)

## 2022-10-27 MED ORDER — GLUCOSE BLOOD VI STRP
ORAL_STRIP | 11 refills | Status: DC
  Filled 2022-10-27: qty 600, 90d supply, fill #0
  Filled 2023-02-01: qty 600, 90d supply, fill #1
  Filled 2023-04-28: qty 100, 20d supply, fill #2

## 2022-10-27 MED ORDER — INSULIN GLARGINE 100 UNIT/ML SOLOSTAR PEN
PEN_INJECTOR | SUBCUTANEOUS | 3 refills | Status: DC
  Filled 2022-10-28: qty 30, 100d supply, fill #0
  Filled 2023-01-31: qty 30, 100d supply, fill #1
  Filled 2023-04-28 – 2023-05-11 (×2): qty 30, 100d supply, fill #2

## 2022-10-27 NOTE — Patient Instructions (Signed)
Carbohydrate Counting for Diabetes Mellitus, Adult Carbohydrate counting is a method of keeping track of how many carbohydrates you eat. Eating carbohydrates increases the amount of sugar (glucose) in the blood. Counting how many carbohydrates you eat improves how well you manage your blood glucose. This, in turn, helps you manage your diabetes. Carbohydrates are measured in grams (g) per serving. It is important to know how many carbohydrates (in grams or by serving size) you can have in each meal. This is different for every person. A dietitian can help you make a meal plan and calculate how many carbohydrates you should have at each meal and snack. What foods contain carbohydrates? Carbohydrates are found in the following foods: Grains, such as breads and cereals. Dried beans and soy products. Starchy vegetables, such as potatoes, peas, and corn. Fruit and fruit juices. Milk and yogurt. Sweets and snack foods, such as cake, cookies, candy, chips, and soft drinks. How do I count carbohydrates in foods? There are two ways to count carbohydrates in food. You can read food labels or learn standard serving sizes of foods. You can use either of these methods or a combination of both. Using the Nutrition Facts label The Nutrition Facts list is included on the labels of almost all packaged foods and beverages in the [place]. It includes: The serving size. Information about nutrients in each serving, including the grams of carbohydrate per serving. To use the Nutrition Facts, decide how many servings you will have. Then, multiply the number of servings by the number of carbohydrates per serving. The resulting number is the total grams of carbohydrates that you will be having. Learning the standard serving sizes of foods When you eat carbohydrate foods that are not packaged or do not include Nutrition Facts on the label, you need to measure the servings in order to count the grams of  carbohydrates. Measure the foods that you will eat with a food scale or measuring cup, if needed. Decide how many standard-size servings you will eat. Multiply the number of servings by 15. For foods that contain carbohydrates, one serving equals 15 g of carbohydrates. For example, if you eat 2 cups or 10 oz (300 g) of strawberries, you will have eaten 2 servings and 30 g of carbohydrates (2 servings x 15 g = 30 g). For foods that have more than one food mixed, such as soups and casseroles, you must count the carbohydrates in each food that is included. The following list contains standard serving sizes of common carbohydrate-rich foods. Each of these servings has about 15 g of carbohydrates: 1 slice of bread. 1 six-inch (15 cm) tortilla. ? cup or 2 oz (53 g) cooked rice or pasta.  cup or 3 oz (85 g) cooked or canned, drained and rinsed beans or lentils.  cup or 3 oz (85 g) starchy vegetable, such as peas, corn, or squash.  cup or 4 oz (120 g) hot cereal.  cup or 3 oz (85 g) boiled or mashed potatoes, or  or 3 oz (85 g) of a large baked potato.  cup or 4 fl oz (118 mL) fruit juice. 1 cup or 8 fl oz (237 mL) milk. 1 small or 4 oz (106 g) apple.  or 2 oz (63 g) of a medium banana. 1 cup or 5 oz (150 g) strawberries. 3 cups or 1 oz (28.3 g) popped popcorn. What is an example of carbohydrate counting? To calculate the grams of carbohydrates in this sample meal, follow the steps   shown below. Sample meal 3 oz (85 g) chicken breast. ? cup or 4 oz (106 g) brown rice.  cup or 3 oz (85 g) corn. 1 cup or 8 fl oz (237 mL) milk. 1 cup or 5 oz (150 g) strawberries with sugar-free whipped topping. Carbohydrate calculation Identify the foods that contain carbohydrates: Rice. Corn. Milk. Strawberries. Calculate how many servings you have of each food: 2 servings rice. 1 serving corn. 1 serving milk. 1 serving strawberries. Multiply each number of servings by 15 g: 2 servings rice x 15  g = 30 g. 1 serving corn x 15 g = 15 g. 1 serving milk x 15 g = 15 g. 1 serving strawberries x 15 g = 15 g. Add together all of the amounts to find the total grams of carbohydrates eaten: 30 g + 15 g + 15 g + 15 g = 75 g of carbohydrates total. What are tips for following this plan? Shopping Develop a meal plan and then make a shopping list. Buy fresh and frozen vegetables, fresh and frozen fruit, dairy, eggs, beans, lentils, and whole grains. Look at food labels. Choose foods that have more fiber and less sugar. Avoid processed foods and foods with added sugars. Meal planning Aim to have the same number of grams of carbohydrates at each meal and for each snack time. Plan to have regular, balanced meals and snacks. Where to find more information American Diabetes Association: diabetes.org Centers for Disease Control and Prevention: cdc.gov Academy of Nutrition and Dietetics: eatright.org Association of Diabetes Care & Education Specialists: diabeteseducator.org Summary Carbohydrate counting is a method of keeping track of how many carbohydrates you eat. Eating carbohydrates increases the amount of sugar (glucose) in your blood. Counting how many carbohydrates you eat improves how well you manage your blood glucose. This helps you manage your diabetes. A dietitian can help you make a meal plan and calculate how many carbohydrates you should have at each meal and snack. This information is not intended to replace advice given to you by your health care provider. Make sure you discuss any questions you have with your health care provider. Document Revised: 06/04/2020 Document Reviewed: 06/04/2020 Elsevier Patient Education  2023 Elsevier Inc. DASH Eating Plan DASH stands for Dietary Approaches to Stop Hypertension. The DASH eating plan is a healthy eating plan that has been shown to: Reduce high blood pressure (hypertension). Reduce your risk for type 2 diabetes, heart disease, and  stroke. Help with weight loss. What are tips for following this plan? Reading food labels Check food labels for the amount of salt (sodium) per serving. Choose foods with less than 5 percent of the Daily Value of sodium. Generally, foods with less than 300 milligrams (mg) of sodium per serving fit into this eating plan. To find whole grains, look for the word "whole" as the first word in the ingredient list. Shopping Buy products labeled as "low-sodium" or "no salt added." Buy fresh foods. Avoid canned foods and pre-made or frozen meals. Cooking Avoid adding salt when cooking. Use salt-free seasonings or herbs instead of table salt or sea salt. Check with your health care provider or pharmacist before using salt substitutes. Do not fry foods. Cook foods using healthy methods such as baking, boiling, grilling, roasting, and broiling instead. Cook with heart-healthy oils, such as olive, canola, avocado, soybean, or sunflower oil. Meal planning  Eat a balanced diet that includes: 4 or more servings of fruits and 4 or more servings of vegetables each day.   Try to fill one-half of your plate with fruits and vegetables. 6-8 servings of whole grains each day. Less than 6 oz (170 g) of lean meat, poultry, or fish each day. A 3-oz (85-g) serving of meat is about the same size as a deck of cards. One egg equals 1 oz (28 g). 2-3 servings of low-fat dairy each day. One serving is 1 cup (237 mL). 1 serving of nuts, seeds, or beans 5 times each week. 2-3 servings of heart-healthy fats. Healthy fats called omega-3 fatty acids are found in foods such as walnuts, flaxseeds, fortified milks, and eggs. These fats are also found in cold-water fish, such as sardines, salmon, and mackerel. Limit how much you eat of: Canned or prepackaged foods. Food that is high in trans fat, such as some fried foods. Food that is high in saturated fat, such as fatty meat. Desserts and other sweets, sugary drinks, and other foods  with added sugar. Full-fat dairy products. Do not salt foods before eating. Do not eat more than 4 egg yolks a week. Try to eat at least 2 vegetarian meals a week. Eat more home-cooked food and less restaurant, buffet, and fast food. Lifestyle When eating at a restaurant, ask that your food be prepared with less salt or no salt, if possible. If you drink alcohol: Limit how much you use to: 0-1 drink a day for women who are not pregnant. 0-2 drinks a day for men. Be aware of how much alcohol is in your drink. In the [place] one drink equals one 12 oz bottle of beer (355 mL), one 5 oz glass of wine (148 mL), or one 1 oz glass of hard liquor (44 mL). General information Avoid eating more than 2,300 mg of salt a day. If you have hypertension, you may need to reduce your sodium intake to 1,500 mg a day. Work with your health care provider to maintain a healthy body weight or to lose weight. Ask what an ideal weight is for you. Get at least 30 minutes of exercise that causes your heart to beat faster (aerobic exercise) most days of the week. Activities may include walking, swimming, or biking. Work with your health care provider or dietitian to adjust your eating plan to your individual calorie needs. What foods should I eat? Fruits All fresh, dried, or frozen fruit. Canned fruit in natural juice (without added sugar). Vegetables Fresh or frozen vegetables (raw, steamed, roasted, or grilled). Low-sodium or reduced-sodium tomato and vegetable juice. Low-sodium or reduced-sodium tomato sauce and tomato paste. Low-sodium or reduced-sodium canned vegetables. Grains Whole-grain or whole-wheat bread. Whole-grain or whole-wheat pasta. Brown rice. Oatmeal. Quinoa. Bulgur. Whole-grain and low-sodium cereals. Pita bread. Low-fat, low-sodium crackers. Whole-wheat flour tortillas. Meats and other proteins Skinless chicken or turkey. Ground chicken or turkey. Pork with fat trimmed off. Fish and seafood. Egg  whites. Dried beans, peas, or lentils. Unsalted nuts, nut butters, and seeds. Unsalted canned beans. Lean cuts of beef with fat trimmed off. Low-sodium, lean precooked or cured meat, such as sausages or meat loaves. Dairy Low-fat (1%) or fat-free (skim) milk. Reduced-fat, low-fat, or fat-free cheeses. Nonfat, low-sodium ricotta or cottage cheese. Low-fat or nonfat yogurt. Low-fat, low-sodium cheese. Fats and oils Soft margarine without trans fats. Vegetable oil. Reduced-fat, low-fat, or light mayonnaise and salad dressings (reduced-sodium). Canola, safflower, olive, avocado, soybean, and sunflower oils. Avocado. Seasonings and condiments Herbs. Spices. Seasoning mixes without salt. Other foods Unsalted popcorn and pretzels. Fat-free sweets. The items listed above may not be   a complete list of foods and beverages you can eat. Contact a dietitian for more information. What foods should I avoid? Fruits Canned fruit in a light or heavy syrup. Fried fruit. Fruit in cream or butter sauce. Vegetables Creamed or fried vegetables. Vegetables in a cheese sauce. Regular canned vegetables (not low-sodium or reduced-sodium). Regular canned tomato sauce and paste (not low-sodium or reduced-sodium). Regular tomato and vegetable juice (not low-sodium or reduced-sodium). Pickles. Olives. Grains Baked goods made with fat, such as croissants, muffins, or some breads. Dry pasta or rice meal packs. Meats and other proteins Fatty cuts of meat. Ribs. Fried meat. Bacon. Bologna, salami, and other precooked or cured meats, such as sausages or meat loaves. Fat from the back of a pig (fatback). Bratwurst. Salted nuts and seeds. Canned beans with added salt. Canned or smoked fish. Whole eggs or egg yolks. Chicken or turkey with skin. Dairy Whole or 2% milk, cream, and half-and-half. Whole or full-fat cream cheese. Whole-fat or sweetened yogurt. Full-fat cheese. Nondairy creamers. Whipped toppings. Processed cheese and  cheese spreads. Fats and oils Butter. Stick margarine. Lard. Shortening. Ghee. Bacon fat. Tropical oils, such as coconut, palm kernel, or palm oil. Seasonings and condiments Onion salt, garlic salt, seasoned salt, table salt, and sea salt. Worcestershire sauce. Tartar sauce. Barbecue sauce. Teriyaki sauce. Soy sauce, including reduced-sodium. Steak sauce. Canned and packaged gravies. Fish sauce. Oyster sauce. Cocktail sauce. Store-bought horseradish. Ketchup. Mustard. Meat flavorings and tenderizers. Bouillon cubes. Hot sauces. Pre-made or packaged marinades. Pre-made or packaged taco seasonings. Relishes. Regular salad dressings. Other foods Salted popcorn and pretzels. The items listed above may not be a complete list of foods and beverages you should avoid. Contact a dietitian for more information. Where to find more information National Heart, Lung, and Blood Institute: www.nhlbi.nih.gov American Heart Association: www.heart.org Academy of Nutrition and Dietetics: www.eatright.org National Kidney Foundation: www.kidney.org Summary The DASH eating plan is a healthy eating plan that has been shown to reduce high blood pressure (hypertension). It may also reduce your risk for type 2 diabetes, heart disease, and stroke. When on the DASH eating plan, aim to eat more fresh fruits and vegetables, whole grains, lean proteins, low-fat dairy, and heart-healthy fats. With the DASH eating plan, you should limit salt (sodium) intake to 2,300 mg a day. If you have hypertension, you may need to reduce your sodium intake to 1,500 mg a day. Work with your health care provider or dietitian to adjust your eating plan to your individual calorie needs. This information is not intended to replace advice given to you by your health care provider. Make sure you discuss any questions you have with your health care provider. Document Revised: 10/05/2019 Document Reviewed: 10/05/2019 Elsevier Patient Education  2023  Elsevier Inc.  

## 2022-10-27 NOTE — Progress Notes (Signed)
Established Patient Office Visit  Subjective   Patient ID: Courtney Stafford, female    DOB: January 27, 1971  Age: 51 y.o. MRN: [id number]  Chief Complaint  Patient presents with   Follow-up         HPI  Courtney Stafford  is a 51 y/o female who has history of T1DM, Hypertension, Stroke,presents for follow up and medication refill.  Her initial BP was 188/114 and a recheck was 179/97 mmHg. Reports that she usually takes her medicine but forgot to take it this morning.  She states that she checks her blood pressure daily but forgot to bring her log. She smokes 1/2 pack of cigarette weekly and denies the desire to quit. She denies chest pain, palpitation, headache and vision changes. He hgbA1c checked during visit increased from 8.4% to 8.9% and her blood glucose was 201 mg/dl. She  states that she checks her blood glucose three time a day at home, ranged from 52 to 380 mg/dl. She denies hypoglycemic symptoms, admits to having polyuria, and taking gabapentin improves her peripheral neuropathy and performs daily foot checks. She also concerns her financial problems.   Overall she states she is doing well and tolerated to current medications and understands how to take medications.   Review of Systems  Constitutional: Negative.   HENT: Negative.    Eyes: Negative.   Respiratory: Negative.    Cardiovascular: Negative.   Genitourinary:  Positive for frequency.  Neurological: Negative.   Endo/Heme/Allergies: Negative.   Psychiatric/Behavioral: Negative.        Objective:     BP (!) 179/97 (BP Location: Right Arm, Patient Position: Sitting, Cuff Size: Large)   Pulse 88   Wt 197 lb 4.8 oz (89.5 kg)   LMP 01/03/2016 (Within Years)   SpO2 98%   BMI 38.53 kg/m  BP Readings from Last 3 Encounters:  10/27/22 (!) 179/97  08/18/22 (!) 172/116  07/14/22 (!) 144/88   Wt Readings from Last 3 Encounters:  10/27/22 197 lb 4.8 oz (89.5 kg)  08/18/22 193 lb 14.4 oz (88 kg)  07/14/22 192 lb 9.6  oz (87.4 kg)   Encouraged weight loss    Physical Exam HENT:     Head: Normocephalic.     Nose: Nose normal.     Mouth/Throat:     Mouth: Mucous membranes are moist.  Eyes:     Extraocular Movements: Extraocular movements intact.     Pupils: Pupils are equal, round, and reactive to light.  Cardiovascular:     Rate and Rhythm: Normal rate and regular rhythm.     Pulses: Normal pulses.     Heart sounds: Normal heart sounds.  Pulmonary:     Effort: Pulmonary effort is normal.     Breath sounds: Normal breath sounds.  Skin:    General: Skin is warm.  Neurological:     General: No focal deficit present.     Mental Status: She is alert and oriented to person, place, and time. Mental status is at baseline.     Cranial Nerves: No cranial nerve deficit.  Psychiatric:        Mood and Affect: Mood normal.        Behavior: Behavior normal.        Thought Content: Thought content normal.        Judgment: Judgment normal.      Results for orders placed or performed in visit on 10/27/22  POCT Glucose (CBG)  Result Value Ref Range  POC Glucose 201 (A) 70 - 99 mg/dl  POCT HgB A1C  Result Value Ref Range   Hemoglobin A1C 8.9 (A) 4.0 - 5.6 %   HbA1c POC (<> result, manual entry)     HbA1c, POC (prediabetic range)     HbA1c, POC (controlled diabetic range)      Last CBC Lab Results  Component Value Date   WBC 6.7 02/02/2022   HGB 15.5 (H) 02/02/2022   HCT 48.0 (H) 02/02/2022   MCV 86.0 02/02/2022   MCH 27.8 02/02/2022   RDW 13.7 02/02/2022   PLT 298 00/37/0488   Last metabolic panel Lab Results  Component Value Date   GLUCOSE 227 (H) 07/14/2022   NA 139 07/14/2022   K 3.8 07/14/2022   CL 98 07/14/2022   CO2 22 07/14/2022   BUN 11 07/14/2022   CREATININE 0.88 07/14/2022   EGFR 79 07/14/2022   CALCIUM 9.3 07/14/2022   PHOS 3.6 11/12/2012   PROT 7.4 02/02/2022   ALBUMIN 3.8 02/02/2022   LABGLOB 2.9 01/14/2021   AGRATIO 1.6 01/14/2021   BILITOT 0.6 02/02/2022    ALKPHOS 105 02/02/2022   AST 22 02/02/2022   ALT 19 02/02/2022   ANIONGAP 8 02/02/2022   Last lipids Lab Results  Component Value Date   CHOL 218 (H) 07/31/2020   HDL 78 07/31/2020   LDLCALC 109 (H) 07/31/2020   TRIG 157 (H) 07/31/2020   CHOLHDL 2.8 07/31/2020   Last hemoglobin A1c Lab Results  Component Value Date   HGBA1C 8.9 (A) 10/27/2022   Last thyroid functions Lab Results  Component Value Date   TSH 0.951 07/31/2020   T4TOTAL 6.3 10/04/2017      The ASCVD Risk score (Arnett DK, et al., 2019) failed to calculate for the following reasons:   The patient has a prior MI or stroke diagnosis    Assessment & Plan:    1. Type 1 diabetes mellitus without complication (HCC) -Her  diabetes is not under control, her HgbA1c was 8.9%, and her goal should be less than 6%. She will continue on current medication, low carb/non concentrated sweet diet and exercise as tolerated. She was advised to continue checking her blood glucose, record and bring log to follow up appointment. - POCT HgB A1C; Future - POCT Glucose (CBG); Future - Urine Microalbumin w/creat. ratio; Future - glucose blood test strip; Use to test blood sugar 5-6 times per day  Dispense: 600 each; Refill: 11 - insulin glargine (LANTUS) 100 UNIT/ML Solostar Pen; Inject 30 units into the skin daily at bedtime  Dispense: 30 mL; Refill: 3 - POCT Glucose (CBG) - POCT HgB A1C  2. Abnormal result on screening urine test - Will recheck urine sample - UA/M w/rflx Culture, Routine; Future  3. Primary hypertension - Her blood pressure is not under control, her goal should be less than 130/80. She was strongly encouraged to take her medication as ordered, DASH diet and exercise as tolerated. Smoking cessation was encouraged. She was educated on the signs and symptoms of stroke and advised to go to the ED.   Return in about 3 months (around 01/26/2023), or if symptoms worsen or fail to improve.    Courtney Jerold Coombe,  NP

## 2022-10-27 NOTE — Progress Notes (Signed)
Patient has medications ready for pick-up at [facility].  She has mobility issues and cannot walk to the [place] bus stops.  She does not have family or friends to bring her to pick-up her medications.  Ride coordinated to and from the pharmacy using [employer].  Sherilyn Dacosta Patient Advocate Specialist [facility] at [facility]

## 2022-10-28 ENCOUNTER — Ambulatory Visit: Payer: Self-pay | Admitting: Licensed Clinical Social Worker

## 2022-10-28 ENCOUNTER — Other Ambulatory Visit: Payer: Self-pay

## 2022-10-28 DIAGNOSIS — F411 Generalized anxiety disorder: Secondary | ICD-10-CM

## 2022-10-28 DIAGNOSIS — F331 Major depressive disorder, recurrent, moderate: Secondary | ICD-10-CM

## 2022-10-28 DIAGNOSIS — F4310 Post-traumatic stress disorder, unspecified: Secondary | ICD-10-CM

## 2022-10-28 NOTE — BH Specialist Note (Signed)
Integrated Behavioral Health via Telemedicine Visit  10/28/2022 Courtney Stafford [id number]  Number of [place] Clinician visits: No data recorded Session Start time: No data recorded  Session End time: No data recorded Total time in minutes: No data recorded  Referring Provider: [name], NP Patient/Family location: The patients address [facility] Provider location: [facility] [place] All persons participating in visit: Nelissa Bolduc and [name], LCSW-A Types of Service: Telephone visit  I connected with Courtney Stafford via Telephone or Video Enabled Telemedicine Application  (Video is Caregility application) and verified that I am speaking with the correct person using two identifiers. Discussed confidentiality: Yes   I discussed the limitations of telemedicine and the availability of in person appointments.  Discussed there is a possibility of technology failure and discussed alternative modes of communication if that failure occurs.  Patient and/or legal guardian expressed understanding and consented to Telemedicine visit: Yes   Presenting Concerns: Patient and/or family reports the following symptoms/concerns: The patient reported that she has been doing about the same since her last follow-up session.  She explained that she saw her primary care provider for a follow-up appointment yesterday.  [name] noted that her primary care provider recommended that she call Department of Social Services and request an application for Medicaid.  The patient noted that she did make the call and they would mail her the Medicaid application but she was unable to fill out the application on her own and requested that the clinician ask someone to help her fill out the application.  The patient discussed financial and other situational stressors that are impacting her life currently.  [name] noted that she was feeling frustrated regarding a lack of  transportation to the pharmacy.  She noted that she had previously spoke to someone at the [facility] and explained that she did not have an income and cannot afford to pay for transportation to her appointments.  [name] explained that her daughter lives out of town and they were not very close and she did not feel comfortable asking her for help.  The patient denied any suicidal or homicidal thoughts. Duration of problem: Years; Severity of problem: moderate  Patient and/or Family's Strengths/Protective Factors: Concrete supports in place (healthy food, safe environments, etc.), Sense of purpose, and Physical Health (exercise, healthy diet, medication compliance, etc.)  Goals Addressed: Patient will:  Reduce symptoms of: agitation, anxiety, depression, insomnia, and stress   Increase knowledge and/or ability of: coping skills, healthy habits, self-management skills, and stress reduction   Demonstrate ability to: Increase healthy adjustment to current life circumstances  Progress towards Goals: Ongoing  Interventions: Interventions utilized:  CBT Cognitive Behavioral Therapywas utilized by the clinician during today's follow up session. Clinician met with patient to identify needs related to stressors and functioning, and assess and monitor for signs and symptoms of anxiety and depression, and assess safety. The clinician processed with the patient how they have been doing since the last follow-up session.Clinician measured the patient's address and depression on a numerical scale.  Clinician offered to connect the patient to [place] Determinants of Health Coordinator at the [facility] for assistance applying for Medicaid and to be linked to transportation services.  Clinician provided a safe space for the patient to vent her frustrations regarding her current life circumstances.  The clinician encouraged the patient to focus on the positives in her life versus the negatives.   The session ended with scheduling. Standardized Assessments completed: GAD-7  and PHQ 9 PHQ-9 = 18 GAD-7 = 18  Assessment: Patient currently experiencing see above.   Patient may benefit from see above.  Plan: Follow up with behavioral health clinician on : November 23, 2022 at 3:00 PM Behavioral recommendations:  Referral(s): [place] (In Clinic)  I discussed the assessment and treatment plan with the patient and/or parent/guardian. They were provided an opportunity to ask questions and all were answered. They agreed with the plan and demonstrated an understanding of the instructions.   They were advised to call back or seek an in-person evaluation if the symptoms worsen or if the condition fails to improve as anticipated.  Lesli Albee, LCSWA

## 2022-10-29 ENCOUNTER — Other Ambulatory Visit: Payer: Self-pay

## 2022-10-31 LAB — UA/M W/RFLX CULTURE, ROUTINE
Bilirubin, UA: NEGATIVE
Leukocytes,UA: NEGATIVE
Nitrite, UA: NEGATIVE
RBC, UA: NEGATIVE
Specific Gravity, UA: 1.027 (ref 1.005–1.030)
Urobilinogen, Ur: 0.2 mg/dL (ref 0.2–1.0)
pH, UA: 5.5 (ref 5.0–7.5)

## 2022-10-31 LAB — MICROSCOPIC EXAMINATION
Casts: NONE SEEN /lpf
Epithelial Cells (non renal): >10 /hpf — AB (ref 0–10)

## 2022-10-31 LAB — MICROALBUMIN / CREATININE URINE RATIO
Creatinine, Urine: 132 mg/dL
Microalb/Creat Ratio: 105 mg/g creat — ABNORMAL HIGH (ref 0–29)
Microalbumin, Urine: 138.7 ug/mL

## 2022-10-31 LAB — URINE CULTURE, REFLEX

## 2022-11-23 ENCOUNTER — Ambulatory Visit: Payer: Self-pay | Admitting: Licensed Clinical Social Worker

## 2022-11-25 ENCOUNTER — Ambulatory Visit: Payer: Self-pay | Admitting: Licensed Clinical Social Worker

## 2022-11-25 DIAGNOSIS — F331 Major depressive disorder, recurrent, moderate: Secondary | ICD-10-CM

## 2022-11-25 DIAGNOSIS — F411 Generalized anxiety disorder: Secondary | ICD-10-CM

## 2022-11-25 NOTE — BH Specialist Note (Signed)
Integrated Behavioral Health via Telemedicine Visit  11/25/2022 Courtney Stafford [id number]   Referring Provider: [name], NP Patient/Family location: The patient's home [facility] Provider location: [facility] [place] All persons participating in visit: Courtney Stafford. [place] and Courtney Stafford, LCSWA Types of Service: Telephone visit  I connected with Courtney Stafford via Telephone or Video Enabled Telemedicine Application  (Video is Caregility application) and verified that I am speaking with the correct person using two identifiers. Discussed confidentiality: Yes   I discussed the limitations of telemedicine and the availability of in person appointments.  Discussed there is a possibility of technology failure and discussed alternative modes of communication if that failure occurs.  Patient and/or legal guardian expressed understanding and consented to Telemedicine visit: Yes   Presenting Concerns: Patient and/or family reports the following symptoms/concerns: The patient reports that she has been doing okay since her last follow-up appointment. She shared that she is concerned that her SNAP benefits are low. She noted that she tries to make her groceries last her a month, but with increased food prices she is barely getting by.  Courtney Stafford noted that she has a list of food banks that with her mobility issues and lack of transportation she is not able to access them.  The patient discussed other financial and situational stressors impacting her life currently.  She noted that she continues to try to walk a couple of times each week as the weather allows but tires very easily and worries about falling.  The patient noted that her daughter works in [place] however she is uncomfortable asking for her assistance since her daughter works full-time job and has a family of her own.  The patient denied any suicidal or homicidal thoughts. Duration of problem: Years; Severity of problem:  moderate  Patient and/or Family's Strengths/Protective Factors: Concrete supports in place (healthy food, safe environments, etc.), Sense of purpose, and Physical Health (exercise, healthy diet, medication compliance, etc.)  Goals Addressed: Patient will:  Reduce symptoms of: agitation, anxiety, depression, insomnia, and stress   Increase knowledge and/or ability of: coping skills, healthy habits, self-management skills, and stress reduction   Demonstrate ability to: Increase healthy adjustment to current life circumstances  Progress towards Goals: Ongoing  Interventions: Interventions utilized:  CBT Cognitive Behavioral Therapy and Link to [employer] utilized by the clinician during today's follow up session. Clinician met with patient to identify needs related to stressors and functioning, and assess and monitor for signs and symptoms of anxiety and depression, and assess safety. The clinician processed with the patient how they have been doing since the last follow-up session. Clinician measured the patients anxiety and depression on a numerical scale.  Clinician offered to connect the patient with Courtney Stafford, social determinant of health coordinator [facility] for assistance accessing community resources and for assistance applying for Medicaid.  Clinician worked with the patient to explore ways to increase her social supports.  Clinician encouraged the patient to continue to practice her coping skills to deal with her current life circumstances.  The session ended with scheduling. Standardized Assessments completed: GAD-7 and PHQ 9 GAD-7=15 PHQ-9=15  Assessment: Patient currently experiencing see above.   Patient may benefit from see above.  Plan: Follow up with behavioral health clinician on : December 02, 2022 at 12:00 noon. Behavioral recommendations:  Referral(s): [place] (In Clinic)  I discussed the assessment and  treatment plan with the patient and/or parent/guardian. They were provided an opportunity to  ask questions and all were answered. They agreed with the plan and demonstrated an understanding of the instructions.   They were advised to call back or seek an in-person evaluation if the symptoms worsen or if the condition fails to improve as anticipated.  Courtney Stafford, LCSWA

## 2022-12-02 ENCOUNTER — Ambulatory Visit: Payer: Self-pay | Admitting: Licensed Clinical Social Worker

## 2022-12-02 DIAGNOSIS — F411 Generalized anxiety disorder: Secondary | ICD-10-CM

## 2022-12-02 DIAGNOSIS — F4310 Post-traumatic stress disorder, unspecified: Secondary | ICD-10-CM

## 2022-12-02 DIAGNOSIS — F331 Major depressive disorder, recurrent, moderate: Secondary | ICD-10-CM

## 2022-12-02 NOTE — BH Specialist Note (Signed)
Integrated Behavioral Health via Telemedicine Visit  12/02/2022 NACOLE FLUHR [id number]  Number of [place] Clinician visits: No data recorded Session Start time: No data recorded  Session End time: No data recorded Total time in minutes: No data recorded  Referring Provider: [name], NP Patient/Family location: The Patients Home [facility] Provider location: [facility] [place] All persons participating in visit: Ashauna Bertholf. [place] and [employer], LCSW-A Types of Service: Telephone visit  I connected with [name]  via  Telephone or Video Enabled Telemedicine Application  (Video is Caregility application) and verified that I am speaking with the correct person using two identifiers. Discussed confidentiality: Yes   I discussed the limitations of telemedicine and the availability of in person appointments.  Discussed there is a possibility of technology failure and discussed alternative modes of communication if that failure occurs.  Patient and/or legal guardian expressed understanding and consented to Telemedicine visit: Yes   Presenting Concerns: Patient and/or family reports the following symptoms/concerns: The patient reports that she has been doing about the same since her last follow-up appointment. Asante shared that she is not feeling well today and has struggled to manage her sugar levels this week. The patient stated that she did not feel bad enough to go to the emergency department and would talk to her doctor if she felt worse. Kaoru noted that she continues to struggle with a lack of transportation. She discussed barriers to applying for Medicaid and shared frustrations regarding transportation. She explained that she recently felt judged because she was told by a case worker to go do some work for someone to earn money for transportation to her medical appointments. The patient discussed how this made her feel and noted that  she doesn't want to complain because she doesn't want to get anyone in trouble. She discussed health related and other situational stressors impacting her life. Braylinn denied any suicidal or homicidal thoughts. Duration of problem: Years; Severity of problem: moderate  Patient and/or Family's Strengths/Protective Factors: Concrete supports in place (healthy food, safe environments, etc.), Sense of purpose, and Physical Health (exercise, healthy diet, medication compliance, etc.)  Goals Addressed: Patient will:  Reduce symptoms of: agitation, anxiety, depression, insomnia, and stress   Increase knowledge and/or ability of: coping skills, healthy habits, self-management skills, and stress reduction   Demonstrate ability to: Increase healthy adjustment to current life circumstances  Progress towards Goals: Ongoing  Interventions: Interventions utilized:  CBT Cognitive Behavioral Therapywas utilized by the clinician during today's follow up session. Clinician met with patient to identify needs related to stressors and functioning, and assess and monitor for signs and symptoms of anxiety and depression, and assess safety. The clinician processed with the patient how they have been doing since the last follow-up session.Clinician measured the patients anxiety and depression on a numerical scale.Clinician offered to message the patient's primary care proClinician encouraged the patient to continue to work on calming techniques (eg. paced breathing, deep muscle relaxation, and calming imagery) as a strategy for responding appropriately to anxiety and the urge to avoid situations or self-isolate when they occur and move towards increasing the patients self regulation.   Standardized Assessments completed: GAD-7 and PHQ 9 GAD-7=15 PHQ-9=13  Assessment: Patient currently experiencing see above.   Patient may benefit from see above.  Plan: Follow up with behavioral health clinician on : 12/08/2022 at  4:00 PM Behavioral recommendations:  Referral(s): [place] (In Clinic)  I discussed the assessment and  treatment plan with the patient and/or parent/guardian. They were provided an opportunity to ask questions and all were answered. They agreed with the plan and demonstrated an understanding of the instructions.   They were advised to call back or seek an in-person evaluation if the symptoms worsen or if the condition fails to improve as anticipated.  Lesli Albee, LCSWA

## 2022-12-07 ENCOUNTER — Other Ambulatory Visit: Payer: Self-pay

## 2022-12-08 ENCOUNTER — Ambulatory Visit: Payer: Self-pay | Admitting: Licensed Clinical Social Worker

## 2022-12-10 ENCOUNTER — Other Ambulatory Visit: Payer: Self-pay

## 2022-12-10 ENCOUNTER — Other Ambulatory Visit: Payer: Self-pay | Admitting: Pharmacy Technician

## 2022-12-10 NOTE — Progress Notes (Signed)
Patient has medications ready for pick-up at [facility].  She does not live in an area that provides [place].  She does not have family or friends to bring her to pick-up her medications.  Ride coordinated to and from the clinic using [employer].  Jacquelynn Cree Patient Advocate Specialist [place] at [facility]

## 2022-12-13 ENCOUNTER — Other Ambulatory Visit: Payer: Self-pay

## 2022-12-14 ENCOUNTER — Other Ambulatory Visit: Payer: Self-pay

## 2022-12-21 ENCOUNTER — Ambulatory Visit: Payer: Self-pay | Admitting: Licensed Clinical Social Worker

## 2022-12-21 ENCOUNTER — Telehealth: Payer: Self-pay | Admitting: Licensed Clinical Social Worker

## 2022-12-21 NOTE — Telephone Encounter (Signed)
Called the patient twice during today's scheduled appointment; no answer, left a voicemail with the clinic contact information so they may reschedule.   

## 2022-12-22 ENCOUNTER — Ambulatory Visit: Payer: Self-pay | Admitting: Licensed Clinical Social Worker

## 2022-12-22 DIAGNOSIS — F331 Major depressive disorder, recurrent, moderate: Secondary | ICD-10-CM

## 2022-12-22 DIAGNOSIS — F411 Generalized anxiety disorder: Secondary | ICD-10-CM

## 2022-12-22 NOTE — BH Specialist Note (Signed)
Integrated Behavioral Health via Telemedicine Visit  12/22/2022 Courtney Stafford [id number]   Referring Provider: [name], NP Patient/Family location: The Patient's Home [facility] Provider location: [facility] [place] All persons participating in visit: Courtney Stafford and Lesli Albee, LCSW-A Types of Service: Telephone visit  I connected with Courtney Stafford via  Telephone or Video Enabled Telemedicine Application  (Video is Caregility application) and verified that I am speaking with the correct person using two identifiers. Discussed confidentiality: Yes   I discussed the limitations of telemedicine and the availability of in person appointments.  Discussed there is a possibility of technology failure and discussed alternative modes of communication if that failure occurs.  I discussed that engaging in this telemedicine visit, they consent to the provision of behavioral healthcare and the services will be billed under their insurance.  Patient and/or legal guardian expressed understanding and consented to Telemedicine visit: Yes   Presenting Concerns: Patient and/or family reports the following symptoms/concerns: The patient reports that she has been doing about the same since her last follow-up session.  She noted that she has not left the house recently and has felt lonely this week.  The patient explained that she did not want to bother her daughter because her daughter works full-time in addition to caring for her own family.  The patient noted that she has no friends or supports.  She stated that she only gets out of the house to come to her doctor's appointment.  Courtney Stafford denied being afraid to leave her home.  She explained that she is housebound because she does not drive she cannot afford transportation or walk to the bus stop since her stroke.  The patient discussed other situational stressors impacting her life currently.  Courtney Stafford denied any suicidal or  homicidal thoughts. Duration of problem: Years; Severity of problem: moderate  Patient and/or Family's Strengths/Protective Factors: Concrete supports in place (healthy food, safe environments, etc.), Sense of purpose, and Physical Health (exercise, healthy diet, medication compliance, etc.)  Goals Addressed: Patient will:  Reduce symptoms of: agitation, anxiety, depression, insomnia, and stress   Increase knowledge and/or ability of: coping skills, healthy habits, self-management skills, and stress reduction   Demonstrate ability to: Increase healthy adjustment to current life circumstances  Progress towards Goals: Ongoing  Interventions: Interventions utilized:  Behavioral Activation was utilized by the clinician during today's session. Clinician provided Psychoeducation regarding behavioral activation.  Clinician worked with the patient to explore virtual or phone-based social connections. Clinician suggested the patient think about joining a support group and offered to help her locate resources to increase her social engagement. Clinician encouraged the client to reach out to her daughter to catch up and strengthen their relationship.  Standardized Assessments completed:  Due to time constraints will complete at follow-up session.   Patient and/or Family Response: Patient expressed understanding and agreed with the plan.  Assessment: Patient currently experiencing see above.   Patient may benefit from see above.  Plan: Follow up with behavioral health clinician on : 12/29/2022 at 11:00 AM  Behavioral recommendations:  Referral(s): [place] (In Clinic)  I discussed the assessment and treatment plan with the patient and/or parent/guardian. They were provided an opportunity to ask questions and all were answered. They agreed with the plan and demonstrated an understanding of the instructions.   They were advised to call back or seek an in-person  evaluation if the symptoms worsen or if the condition fails to improve as anticipated.  Lesli Albee, LCSWA

## 2022-12-29 ENCOUNTER — Ambulatory Visit: Payer: Self-pay | Admitting: Licensed Clinical Social Worker

## 2023-01-03 ENCOUNTER — Ambulatory Visit: Payer: Self-pay | Admitting: Licensed Clinical Social Worker

## 2023-01-04 ENCOUNTER — Other Ambulatory Visit: Payer: Self-pay

## 2023-01-12 ENCOUNTER — Telehealth: Payer: Self-pay

## 2023-01-12 ENCOUNTER — Other Ambulatory Visit: Payer: Self-pay

## 2023-01-12 NOTE — Telephone Encounter (Signed)
Spoke with pt and made appt with [name] since pt is no longer eligible for medicaid. Pt asked if we could pay for transportation. I agreed to pay for a cab one more time for pt due to me working with [employer] and getting the transit bus to be able to come to pt's house to pick her up which was her barrier to the transportation issue prior to me working with [employer] and her not being able to make it to the bus stop. Pt verbalized understanding and thanked me.

## 2023-01-18 ENCOUNTER — Ambulatory Visit: Payer: Self-pay | Admitting: Licensed Clinical Social Worker

## 2023-01-18 DIAGNOSIS — F411 Generalized anxiety disorder: Secondary | ICD-10-CM

## 2023-01-18 DIAGNOSIS — F331 Major depressive disorder, recurrent, moderate: Secondary | ICD-10-CM

## 2023-01-18 NOTE — BH Specialist Note (Signed)
Integrated Behavioral Health via Telemedicine Visit  01/18/2023 [name] [id number]  Number of [place] Clinician visits: No data recorded Session Start time: No data recorded  Session End time: No data recorded Total time in minutes: No data recorded  Referring Provider: [name], NP Patient/Family location: The patient's home [facility] Provider location: Remote;[place], [place]  All persons participating in visit: Harlin Rain and [name], LCSW-A Types of Service: Telephone visit  I connected with Courtney Stafford via Telephone or Video Enabled Telemedicine Application  (Video is Caregility application) and verified that I am speaking with the correct person using two identifiers. Discussed confidentiality: Yes   I discussed the limitations of telemedicine and the availability of in person appointments.  Discussed there is a possibility of technology failure and discussed alternative modes of communication if that failure occurs.  Patient and/or legal guardian expressed understanding and consented to Telemedicine visit: Yes   Presenting Concerns: Patient and/or family reports the following symptoms/concerns: The patient reports that she has been doing worse since her last follow-up appointment. Courtney Stafford shared that she received a call from her cousin on Sunday who notified her that her mother passed away. She explained that she has not had any contact with her mother or that side of her family in over 53 years. She shared that her cousin had told her that she was forbidden to ever try to contact the patient when she was growing up and they discussed several family secrets. The patient explained that her daughter offered to go with her to the funeral and had never met any of her family. However, the patient expressed concern that her daughter would miss work so she planned to go with her cousin. Courtney Stafford noted she felt like she was still in shock and  didn't know what she was suppose to be feeling. She explained that she just wants closure. [place] denied any suicidal or homicidal thoughts.  problem: Years; Severity of problem: moderate  Patient and/or Family's Strengths/Protective Factors: Concrete supports in place (healthy food, safe environments, etc.), Sense of purpose, and Physical Health (exercise, healthy diet, medication compliance, etc.)  Goals Addressed: Patient will:  Reduce symptoms of: anxiety, depression, insomnia, and stress   Increase knowledge and/or ability of: coping skills, healthy habits, self-management skills, and stress reduction   Demonstrate ability to: Increase healthy adjustment to current life circumstances and Begin healthy grieving over loss  Progress towards Goals: Ongoing  Interventions: Interventions utilized:  Supportive Counselingwas utilized by the clinician during today's follow up session. Clinician met with patient to identify needs related to stressors and functioning, and assess and monitor for signs and symptoms of anxiety and depression, and assess safety. The clinician processed with the patient how they have been doing since the last follow-up session.  Clinician offered the patient her condolences on the loss of her mother. Clinician worked to normalize the patient's grief experience and encouraged the patient to focus on her self care and engage in daily activities that bring her joy  even if she does not feel like doing them initially. The session ended with scheduling.  Standardized Assessments completed: GAD-7 GAD-7= 19   Patient and/or Family Response: Patient expressed understanding and agreed with the plan.   Assessment: Patient currently experiencing see above.   Patient may benefit from see above.  Plan: Follow up with behavioral health clinician on : 01/23/2022 at 4:00 PM Behavioral recommendations:  Referral(s): [place] (In Clinic)  I discussed  the assessment  and treatment plan with the patient and/or parent/guardian. They were provided an opportunity to ask questions and all were answered. They agreed with the plan and demonstrated an understanding of the instructions.   They were advised to call back or seek an in-person evaluation if the symptoms worsen or if the condition fails to improve as anticipated.  Lesli Albee, LCSWA

## 2023-01-19 ENCOUNTER — Other Ambulatory Visit: Payer: Self-pay

## 2023-01-21 ENCOUNTER — Other Ambulatory Visit: Payer: Self-pay

## 2023-01-24 ENCOUNTER — Ambulatory Visit: Payer: Self-pay | Admitting: Licensed Clinical Social Worker

## 2023-01-24 DIAGNOSIS — F411 Generalized anxiety disorder: Secondary | ICD-10-CM

## 2023-01-24 DIAGNOSIS — F331 Major depressive disorder, recurrent, moderate: Secondary | ICD-10-CM

## 2023-01-24 NOTE — BH Specialist Note (Signed)
Integrated Behavioral Health via Telemedicine Visit  01/24/2023 [name] [id number]  Number of [place] Clinician visits: No data recorded Session Start time: No data recorded  Session End time: No data recorded Total time in minutes: No data recorded  Referring Provider: [name], Np  Patient/Family location: The Patient's Home  [facility] Provider location: Remote; [place], [place]  All persons participating in visit: Courtney Stafford and [employer], LCSW-A Types of Service: Telephone visit  I connected with Courtney Stafford  via  Telephone or Video Enabled Telemedicine Application  (Video is Caregility application) and verified that I am speaking with the correct person using two identifiers. Discussed confidentiality: Yes   I discussed the limitations of telemedicine and the availability of in person appointments.  Discussed there is a possibility of technology failure and discussed alternative modes of communication if that failure occurs.  Patient and/or legal guardian expressed understanding and consented to Telemedicine visit: Yes   Presenting Concerns: Patient and/or family reports the following symptoms/concerns: The patient reports that she has been doing about the same since her last follow-up session.  Courtney Stafford shared that she went to her late mother's visitation with her aunt whom she has not had any contact with in the last 30 years.  She discussed familial stressors that are impacting her life currently.  Courtney Stafford noted that she has been having a slightly more difficult time falling asleep over the last week.  She noted that she plans to work toward building a relationship with her aunt and cousins, and hopes they will stay in touch with her.  Courtney Stafford shared that her daughter will accompany her to the funeral services next Monday.  The patient denied any suicidal or homicidal thoughts. Duration of problem: Years; Severity of problem:  moderate  Patient and/or Family's Strengths/Protective Factors: Concrete supports in place (healthy food, safe environments, etc.)  Goals Addressed: Patient will:  Reduce symptoms of: agitation, anxiety, depression, insomnia, and stress   Increase knowledge and/or ability of: coping skills, healthy habits, self-management skills, and stress reduction   Demonstrate ability to: Increase healthy adjustment to current life circumstances and Begin healthy grieving over loss  Progress towards Goals: Ongoing  Interventions: Interventions utilized:  CBT Cognitive Behavioral Therapy was utilized by the clinician during today's follow up session. Clinician met with patient to identify needs related to stressors and functioning, and assess and monitor for signs and symptoms of anxiety and depression, and assess safety. The clinician processed with the patient how they have been doing since the last follow-up session. Clinician assisted the patient in setting realistic and achievable goals related to her grieving journey such as reaching out for support from her daughter over the next week and engaging in daily activities that bring her joy. The session ended with scheduling.  Standardized Assessments completed: GAD-7 and PHQ 9 GAD-7=19 PHQ-9=15  Patient and/or Family Response: Patient expressed understanding and agreed with the plan.  Assessment: Patient currently experiencing see above.   Patient may benefit from see above.  Plan: Follow up with behavioral health clinician on : 02/01/2023 at 4:00 PM Behavioral recommendations:  Referral(s): [place] (In Clinic)  I discussed the assessment and treatment plan with the patient and/or parent/guardian. They were provided an opportunity to ask questions and all were answered. They agreed with the plan and demonstrated an understanding of the instructions.   They were advised to call back or seek an in-person evaluation  if the symptoms worsen or if the  condition fails to improve as anticipated.  Lesli Albee, LCSWA

## 2023-01-26 ENCOUNTER — Ambulatory Visit: Payer: Self-pay | Admitting: Gerontology

## 2023-01-31 ENCOUNTER — Other Ambulatory Visit (HOSPITAL_COMMUNITY): Payer: Self-pay

## 2023-01-31 ENCOUNTER — Other Ambulatory Visit: Payer: Self-pay

## 2023-02-01 ENCOUNTER — Other Ambulatory Visit: Payer: Self-pay | Admitting: Gerontology

## 2023-02-01 ENCOUNTER — Ambulatory Visit: Payer: Self-pay | Admitting: Licensed Clinical Social Worker

## 2023-02-01 ENCOUNTER — Other Ambulatory Visit: Payer: Self-pay

## 2023-02-01 ENCOUNTER — Telehealth: Payer: Self-pay | Admitting: Licensed Clinical Social Worker

## 2023-02-01 DIAGNOSIS — F411 Generalized anxiety disorder: Secondary | ICD-10-CM

## 2023-02-01 DIAGNOSIS — F331 Major depressive disorder, recurrent, moderate: Secondary | ICD-10-CM

## 2023-02-01 DIAGNOSIS — F4310 Post-traumatic stress disorder, unspecified: Secondary | ICD-10-CM

## 2023-02-01 MED ORDER — MIRTAZAPINE 15 MG PO TABS
15.0000 mg | ORAL_TABLET | Freq: Every day | ORAL | 0 refills | Status: DC
  Filled 2023-02-01: qty 90, 90d supply, fill #0

## 2023-02-01 NOTE — Telephone Encounter (Signed)
I called the patient to inform her that a medication refill was sent to [facility] as she requested, that she would still be seen for her appointment with [name], NP, on 02/09/2023, and that a cab had been arranged to pick her up and take her home for that appointment; no answer, left a message with clinic information so she may call back.

## 2023-02-01 NOTE — Telephone Encounter (Signed)
OK per [name]

## 2023-02-01 NOTE — BH Specialist Note (Signed)
Integrated Behavioral Health via Telemedicine Visit  02/01/2023 MICHAELAH CREDEUR [id number]  Number of [place] Clinician visits: No data recorded Session Start time: No data recorded  Session End time: No data recorded Total time in minutes: No data recorded  Referring Provider: [name], Np Patient/Family location: The patient's home [facility] Provider location: Remote; [place]  All persons participating in visit: Talana Slatten. [place] and [employer], LCSW-A Types of Service: Telephone visit  I connected with [name] via  Telephone or Video Enabled Telemedicine Application  (Video is Caregility application) and verified that I am speaking with the correct person using two identifiers. Discussed confidentiality: Yes   I discussed the limitations of telemedicine and the availability of in person appointments.  Discussed there is a possibility of technology failure and discussed alternative modes of communication if that failure occurs.   Patient and/or legal guardian expressed understanding and consented to Telemedicine visit: Yes   Presenting Concerns: Patient and/or family reports the following symptoms/concerns: The Patient reports that she has been doing the same since her last follow-up appointment. She shared that her daughter picked her up yesterday and went with her to her mother's service. She noted that she was scared for her daughter to attend because of the circumstances surrounding the family estrangement. However, the patient stated that it went well. She shared that her aunt sent her an outfit and shoes to wear for the service. She said her aunt has changed a lot over the years. She recalled that her aunt shamed her and called her names the last time they saw each other. She discussed struggling with memories and emotions from the last time she saw some of her family and the hurt and pain she experienced. She noted that she felt nervous  around them, but hopes some of them keep in touch. She shared she thought her cousin was ignoring her text but realized she was dialing the wrong number. Her cousin helped her change the number in her phone.  The patient cried regarding having insurance and insisted she has never signed up for any policy and has no income or money to pay for any policy. She noted she was told last month that she had Medicaid and worried  about how she was going to get her medicines and insulin.  She stated that even though she called Medicaid and they told her she did not have Medicaid nobody believed her. The patient said she would call the insurance company. [place] denied any suicidal or homicidal thoughts.  Duration of problem: Years; Severity of problem: moderate  Patient and/or Family's Strengths/Protective Factors: Sense of purpose, Physical Health (exercise, healthy diet, medication compliance, etc.), and Caregiver has knowledge of parenting & child development  Goals Addressed: Patient will:  Reduce symptoms of: agitation, anxiety, depression, and stress   Increase knowledge and/or ability of: coping skills, healthy habits, self-management skills, and stress reduction   Demonstrate ability to: Increase healthy adjustment to current life circumstances  Progress towards Goals: Ongoing  Interventions: Interventions utilized:  CBT Cognitive Behavioral Therapy and Link to [employer] was utilized by the clinician during today's follow up session. Clinician met with patient to identify needs related to stressors and functioning, and assess and monitor for signs and symptoms of anxiety and depression, and assess safety. The clinician processed with the patient how they have been doing since the last follow-up session.  The clinician provided a safe, judgment-free space for the patient to fully express her  thoughts and emotions regarding the loss of her estranged mother and reconnection with her family  members at her mother's funeral yesterday, whom she had not seen in 74 years. Clinician responded to the patient with active listening and empathy throughout today's session. Clinician informed the patient that she was asked to relay that [employer] was showing up as active and was verified twice to ensure it was correct. Clinician provided a safe space for the patient to vent her frustrations and concerns. Clinician assured the patient that she would be supported during the up coming transition of care and verified that the patient would be seen one last time at [facility] on 02/09/2023 and transportation would be provided. Clinician provided the patient with her policy number and [employer] contact information and encouraged the patient to call them today. Clinician offered a session on Monday.    Standardized Assessments completed:  Due to time constraints will complete at follow-up.    Patient and/or Family Response: Patient expressed understanding and agreed with the plan.  Assessment: Patient currently experiencing see above.   Patient may benefit from see above.  Plan: Follow up with behavioral health clinician on : 03/10/2023 at 2:00 PM  Behavioral recommendations:  Referral(s): [facility] (In Clinic)  I discussed the assessment and treatment plan with the patient and/or parent/guardian. They were provided an opportunity to ask questions and all were answered. They agreed with the plan and demonstrated an understanding of the instructions.   They were advised to call back or seek an in-person evaluation if the symptoms worsen or if the condition fails to improve as anticipated.  Lesli Albee, LCSWA

## 2023-02-02 ENCOUNTER — Other Ambulatory Visit: Payer: Self-pay

## 2023-02-03 NOTE — BH Specialist Note (Incomplete)
Integrated Behavioral Health via Telemedicine Visit  02/01/2023 Courtney Stafford [id number]  Number of [place] Clinician visits: No data recorded Session Start time: No data recorded  Session End time: No data recorded Total time in minutes: No data recorded  Referring Provider: [name], Np Patient/Family location: The patient's home [facility] Provider location: Remote; [place]  All persons participating in visit: Courtney Stafford. Courtney Stafford and [employer], LCSW-A Types of Service: Telephone visit  I connected with Courtney Stafford via  Telephone or Video Enabled Telemedicine Application  (Video is Caregility application) and verified that I am speaking with the correct person using two identifiers. Discussed confidentiality: Yes   I discussed the limitations of telemedicine and the availability of in person appointments.  Discussed there is a possibility of technology failure and discussed alternative modes of communication if that failure occurs.   Patient and/or legal guardian expressed understanding and consented to Telemedicine visit: Yes   Presenting Concerns: Patient and/or family reports the following symptoms/concerns: The Patient reports that she has been doing okay since her last follow-up appointment. She shared that her daughter picked her up yesterday and went with her to her mother's service. She noted that she was scared for her daughter to attend because of the circumstances surrounding the families estrangement. However, the patient stated that it went well. She shared that her aunt sent her an outfit and shoes to wear for the service. She said her aunt has changed a lot over the years. She recalled that her aunt shamed her and called her names the last time they saw each other. She discussed struggling with memories and emotions from the last time she saw some of her family and the hurt and pain she experienced. She noted that she on edge and  nervous around them, bt hopes some of them keep in touch. She shared she thought her cousin was ignoring her text but realized she was dialing the wrong number. Her cousin helped her change the number in her phone.  The patient cried regarding having insurance and insisted she has never signed up for any policy and has no income to payfo any  Duration of problem: Years; Severity of problem: moderate  Patient and/or Family's Strengths/Protective Factors: Sense of purpose, Physical Health (exercise, healthy diet, medication compliance, etc.), and Caregiver has knowledge of parenting & child development  Goals Addressed: Patient will:  Reduce symptoms of: agitation, anxiety, depression, and stress   Increase knowledge and/or ability of: coping skills, healthy habits, self-management skills, and stress reduction   Demonstrate ability to: Increase healthy adjustment to current life circumstances  Progress towards Goals: Ongoing  Interventions: Interventions utilized:  CBT Cognitive Behavioral Therapy and Link to [employer] was utilized by the clinician during today's follow up session. Clinician met with patient to identify needs related to stressors and functioning, and assess and monitor for signs and symptoms of anxiety and depression, and assess safety. The clinician processed with the patient how they have been doing since the last follow-up session.  The clinician provided a safe, judgment-free space for the patient to fully express her thoughts and emotions regarding the loss of her estranged mother and reconnection with her family members at her mother's funeral yesterday, whom she had not seen in 102 years. Clinician responded to the patient with active listening and empathy throughout today's session. Clinician informed the patient that she was asked to relay that [employer] was showing up as active and was verified twice to  ensure it was correct. Clinician provided a safe space for  the patient to vent her frustrations and concerns. Clinician assured the patient that she would be supported during the up coming transition of care and verified that the patient would be seen one last time at [facility] on 02/09/2023 and transportation would be provided. Clinician provided the patient with her policy number and [employer] contact information and encouraged the patient to call them today. Clinician offered a session on Monday.    Standardized Assessments completed:  Due to time constraints will complete at follow-up.    Patient and/or Family Response: Patient expressed understanding and agreed with the plan.  Assessment: Patient currently experiencing see above.   Patient may benefit from see above.  Plan: Follow up with behavioral health clinician on : 03/10/2023 at 2:00 PM  Behavioral recommendations:  Referral(s): [place] (In Clinic)  I discussed the assessment and treatment plan with the patient and/or parent/guardian. They were provided an opportunity to ask questions and all were answered. They agreed with the plan and demonstrated an understanding of the instructions.   They were advised to call back or seek an in-person evaluation if the symptoms worsen or if the condition fails to improve as anticipated.  Lesli Albee, LCSWA

## 2023-02-07 ENCOUNTER — Ambulatory Visit: Payer: Self-pay | Admitting: Licensed Clinical Social Worker

## 2023-02-08 ENCOUNTER — Other Ambulatory Visit: Payer: Self-pay

## 2023-02-09 ENCOUNTER — Ambulatory Visit: Payer: Self-pay

## 2023-02-09 ENCOUNTER — Ambulatory Visit: Payer: Self-pay | Admitting: Gerontology

## 2023-02-15 ENCOUNTER — Ambulatory Visit: Payer: Self-pay | Admitting: Licensed Clinical Social Worker

## 2023-02-15 ENCOUNTER — Other Ambulatory Visit: Payer: Self-pay

## 2023-02-15 ENCOUNTER — Ambulatory Visit: Payer: Self-pay

## 2023-02-15 ENCOUNTER — Ambulatory Visit: Payer: Self-pay | Admitting: Gerontology

## 2023-02-15 ENCOUNTER — Encounter: Payer: Self-pay | Admitting: Gerontology

## 2023-02-15 VITALS — BP 157/95 | HR 83 | Resp 16 | Wt 199.7 lb

## 2023-02-15 DIAGNOSIS — E109 Type 1 diabetes mellitus without complications: Secondary | ICD-10-CM

## 2023-02-15 DIAGNOSIS — I10 Essential (primary) hypertension: Secondary | ICD-10-CM

## 2023-02-15 DIAGNOSIS — G2581 Restless legs syndrome: Secondary | ICD-10-CM

## 2023-02-15 MED ORDER — HYDRALAZINE HCL 25 MG PO TABS
25.0000 mg | ORAL_TABLET | Freq: Two times a day (BID) | ORAL | 1 refills | Status: DC
  Filled 2023-02-15: qty 180, 90d supply, fill #0
  Filled 2023-04-28 – 2023-05-11 (×2): qty 60, 30d supply, fill #0

## 2023-02-15 MED ORDER — CARVEDILOL 12.5 MG PO TABS
12.5000 mg | ORAL_TABLET | Freq: Two times a day (BID) | ORAL | 1 refills | Status: DC
Start: 2023-02-15 — End: 2023-05-11
  Filled 2023-02-15: qty 180, 90d supply, fill #0
  Filled 2023-04-28 – 2023-05-11 (×2): qty 60, 30d supply, fill #0

## 2023-02-15 MED ORDER — LISINOPRIL 40 MG PO TABS
40.0000 mg | ORAL_TABLET | Freq: Every day | ORAL | 1 refills | Status: DC
Start: 2023-02-15 — End: 2023-05-11
  Filled 2023-02-15: qty 90, 90d supply, fill #0
  Filled 2023-04-28 – 2023-05-11 (×2): qty 30, 30d supply, fill #0

## 2023-02-15 MED ORDER — NOVOFINE PEN NEEDLE 32G X 6 MM MISC
3 refills | Status: DC
Start: 2023-02-15 — End: 2023-05-11
  Filled 2023-02-15: qty 800, fill #0
  Filled 2023-04-28: qty 400, 100d supply, fill #0
  Filled 2023-05-11: qty 100, 25d supply, fill #0

## 2023-02-15 MED ORDER — GABAPENTIN 300 MG PO CAPS
ORAL_CAPSULE | ORAL | 1 refills | Status: DC
Start: 2023-02-15 — End: 2023-05-11
  Filled 2023-02-15 – 2023-04-28 (×2): qty 210, 30d supply, fill #0
  Filled 2023-05-11: qty 180, 30d supply, fill #0

## 2023-02-15 MED ORDER — AMLODIPINE BESYLATE 10 MG PO TABS
10.0000 mg | ORAL_TABLET | Freq: Every day | ORAL | 0 refills | Status: DC
Start: 2023-02-15 — End: 2023-05-11
  Filled 2023-02-15: qty 90, 90d supply, fill #0
  Filled 2023-04-28 – 2023-05-11 (×2): qty 30, 30d supply, fill #0

## 2023-02-15 MED ORDER — HYDROCHLOROTHIAZIDE 25 MG PO TABS
25.0000 mg | ORAL_TABLET | Freq: Every day | ORAL | 1 refills | Status: DC
Start: 2023-02-15 — End: 2023-05-11
  Filled 2023-02-15: qty 90, 90d supply, fill #0
  Filled 2023-04-28 – 2023-05-11 (×2): qty 30, 30d supply, fill #0

## 2023-02-15 NOTE — Patient Instructions (Addendum)
Exercises to do While Sitting  Exercises that you do while sitting (chair exercises) can give you many of the same benefits as full exercise. Benefits include strengthening your heart, burning calories, and keeping muscles and joints healthy. Exercise can also improve your mood and help with depression and anxiety. You may benefit from chair exercises if you are unable to do standing exercises due to: Diabetic foot pain. Obesity. Illness. Arthritis. Recovery from surgery or injury. Breathing problems. Balance problems. Another type of disability. Before starting chair exercises, check with your health care provider or a physical therapist to find out how much exercise you can tolerate and which exercises are safe for you. If your health care provider approves: Start out slowly and build up over time. Aim to work up to about 10-20 minutes for each exercise session. Make exercise part of your daily routine. Drink water when you exercise. Do not wait until you are thirsty. Drink every 10-15 minutes. Stop exercising right away if you have pain, nausea, shortness of breath, or dizziness. If you are exercising in a wheelchair, make sure to lock the wheels. Ask your health care provider whether you can do tai chi or yoga. Many positions in these mind-body exercises can be modified to do while seated. Warm-up Before starting other exercises: Sit up as straight as you can. Have your knees bent at 90 degrees, which is the shape of the capital letter "L." Keep your feet flat on the floor. Sit at the front edge of your chair, if you can. Pull in (tighten) the muscles in your abdomen and stretch your spine and neck as straight as you can. Hold this position for a few minutes. Breathe in and out evenly. Try to concentrate on your breathing, and relax your mind. Stretching Exercise A: Arm stretch Hold your arms out straight in front of your body. Bend your hands at the wrist with your fingers pointing  up, as if signaling someone to stop. Notice the slight tension in your forearms as you hold the position. Keeping your arms out and your hands bent, rotate your hands outward as far as you can and hold this stretch. Aim to have your thumbs pointing up and your pinkie fingers pointing down. Slowly repeat arm stretches for one minute as tolerated. Exercise B: Leg stretch If you can move your legs, try to "draw" letters on the floor with the toes of your foot. Write your name with one foot. Write your name with the toes of your other foot. Slowly repeat the movements for one minute as tolerated. Exercise C: Reach for the sky Reach your hands as far over your head as you can to stretch your spine. Move your hands and arms as if you are climbing a rope. Slowly repeat the movements for one minute as tolerated. Range of motion exercises Exercise A: Shoulder roll Let your arms hang loosely at your sides. Lift just your shoulders up toward your ears, then let them relax back down. When your shoulders feel loose, rotate your shoulders in backward and forward circles. Do shoulder rolls slowly for one minute as tolerated. Exercise B: March in place As if you are marching, pump your arms and lift your legs up and down. Lift your knees as high as you can. If you are unable to lift your knees, just pump your arms and move your ankles and feet up and down. March in place for one minute as tolerated. Exercise C: Seated jumping jacks Let your arms hang  down straight. Keeping your arms straight, lift them up over your head. Aim to point your fingers to the ceiling. While you lift your arms, straighten your legs and slide your heels along the floor to your sides, as wide as you can. As you bring your arms back down to your sides, slide your legs back together. If you are unable to use your legs, just move your arms. Slowly repeat seated jumping jacks for one minute as tolerated. Strengthening  exercises Exercise A: Shoulder squeeze Hold your arms straight out from your body to your sides, with your elbows bent and your fists pointed at the ceiling. Keeping your arms in the bent position, move them forward so your elbows and forearms meet in front of your face. Open your arms back out as wide as you can with your elbows still bent, until you feel your shoulder blades squeezing together. Hold for 5 seconds. Slowly repeat the movements forward and backward for one minute as tolerated. Contact a health care provider if: You have to stop exercising due to any of the following: Pain. Nausea. Shortness of breath. Dizziness. Fatigue. You have significant pain or soreness after exercising. Get help right away if: You have chest pain. You have difficulty breathing. These symptoms may represent a serious problem that is an emergency. Do not wait to see if the symptoms will go away. Get medical help right away. Call your local emergency services (911 in the [place] Do not drive yourself to the hospital. Summary Exercises that you do while sitting (chair exercises) can strengthen your heart, burn calories, and keep muscles and joints healthy. You may benefit from chair exercises if you are unable to do standing exercises due to diabetic foot pain, obesity, recovery from surgery or injury, or other conditions. Before starting chair exercises, check with your health care provider or a physical therapist to find out how much exercise you can tolerate and which exercises are safe for you. This information is not intended to replace advice given to you by your health care provider. Make sure you discuss any questions you have with your health care provider. Document Revised: 12/28/2020 Document Reviewed: 12/28/2020 Elsevier Patient Education  [place] Eating Plan DASH stands for Dietary Approaches to Stop Hypertension. The DASH eating plan is a healthy eating plan that has been  shown to: Reduce high blood pressure (hypertension). Reduce your risk for type 2 diabetes, heart disease, and stroke. Help with weight loss. What are tips for following this plan? Reading food labels Check food labels for the amount of salt (sodium) per serving. Choose foods with less than 5 percent of the Daily Value of sodium. Generally, foods with less than 300 milligrams (mg) of sodium per serving fit into this eating plan. To find whole grains, look for the word "whole" as the first word in the ingredient list. Shopping Buy products labeled as "low-sodium" or "no salt added." Buy fresh foods. Avoid canned foods and pre-made or frozen meals. Cooking Avoid adding salt when cooking. Use salt-free seasonings or herbs instead of table salt or sea salt. Check with your health care provider or pharmacist before using salt substitutes. Do not fry foods. Cook foods using healthy methods such as baking, boiling, grilling, roasting, and broiling instead. Cook with heart-healthy oils, such as olive, canola, avocado, soybean, or sunflower oil. Meal planning  Eat a balanced diet that includes: 4 or more servings of fruits and 4 or more servings of vegetables each day. Try to  fill one-half of your plate with fruits and vegetables. 6-8 servings of whole grains each day. Less than 6 oz (170 g) of lean meat, poultry, or fish each day. A 3-oz (85-g) serving of meat is about the same size as a deck of cards. One egg equals 1 oz (28 g). 2-3 servings of low-fat dairy each day. One serving is 1 cup (237 mL). 1 serving of nuts, seeds, or beans 5 times each week. 2-3 servings of heart-healthy fats. Healthy fats called omega-3 fatty acids are found in foods such as walnuts, flaxseeds, fortified milks, and eggs. These fats are also found in cold-water fish, such as sardines, salmon, and mackerel. Limit how much you eat of: Canned or prepackaged foods. Food that is high in trans fat, such as some fried  foods. Food that is high in saturated fat, such as fatty meat. Desserts and other sweets, sugary drinks, and other foods with added sugar. Full-fat dairy products. Do not salt foods before eating. Do not eat more than 4 egg yolks a week. Try to eat at least 2 vegetarian meals a week. Eat more home-cooked food and less restaurant, buffet, and fast food. Lifestyle When eating at a restaurant, ask that your food be prepared with less salt or no salt, if possible. If you drink alcohol: Limit how much you use to: 0-1 drink a day for women who are not pregnant. 0-2 drinks a day for men. Be aware of how much alcohol is in your drink. In the [place] one drink equals one 12 oz bottle of beer (355 mL), one 5 oz glass of wine (148 mL), or one 1 oz glass of hard liquor (44 mL). General information Avoid eating more than 2,300 mg of salt a day. If you have hypertension, you may need to reduce your sodium intake to 1,500 mg a day. Work with your health care provider to maintain a healthy body weight or to lose weight. Ask what an ideal weight is for you. Get at least 30 minutes of exercise that causes your heart to beat faster (aerobic exercise) most days of the week. Activities may include walking, swimming, or biking. Work with your health care provider or dietitian to adjust your eating plan to your individual calorie needs. What foods should I eat? Fruits All fresh, dried, or frozen fruit. Canned fruit in natural juice (without added sugar). Vegetables Fresh or frozen vegetables (raw, steamed, roasted, or grilled). Low-sodium or reduced-sodium tomato and vegetable juice. Low-sodium or reduced-sodium tomato sauce and tomato paste. Low-sodium or reduced-sodium canned vegetables. Grains Whole-grain or whole-wheat bread. Whole-grain or whole-wheat pasta. Brown rice. [place]. Bulgur. Whole-grain and low-sodium cereals. Pita bread. Low-fat, low-sodium crackers. Whole-wheat flour tortillas. Meats  and other proteins Skinless chicken or [place]. Ground chicken or [place]. Pork with fat trimmed off. Fish and seafood. Egg whites. Dried beans, peas, or lentils. Unsalted nuts, nut butters, and seeds. Unsalted canned beans. Lean cuts of beef with fat trimmed off. Low-sodium, lean precooked or cured meat, such as sausages or meat loaves. Dairy Low-fat (1%) or fat-free (skim) milk. Reduced-fat, low-fat, or fat-free cheeses. Nonfat, low-sodium ricotta or cottage cheese. Low-fat or nonfat yogurt. Low-fat, low-sodium cheese. Fats and oils Soft margarine without trans fats. Vegetable oil. Reduced-fat, low-fat, or light mayonnaise and salad dressings (reduced-sodium). Canola, safflower, olive, avocado, soybean, and sunflower oils. Avocado. Seasonings and condiments Herbs. Spices. Seasoning mixes without salt. Other foods Unsalted popcorn and pretzels. Fat-free sweets. The items listed above may not be a complete  list of foods and beverages you can eat. Contact a dietitian for more information. What foods should I avoid? Fruits Canned fruit in a light or heavy syrup. Fried fruit. Fruit in cream or butter sauce. Vegetables Creamed or fried vegetables. Vegetables in a cheese sauce. Regular canned vegetables (not low-sodium or reduced-sodium). Regular canned tomato sauce and paste (not low-sodium or reduced-sodium). Regular tomato and vegetable juice (not low-sodium or reduced-sodium). Angie Fava. Olives. Grains Baked goods made with fat, such as croissants, muffins, or some breads. Dry pasta or rice meal packs. Meats and other proteins Fatty cuts of meat. Ribs. Fried meat. Berniece Salines. Bologna, salami, and other precooked or cured meats, such as sausages or meat loaves. Fat from the back of a pig (fatback). Bratwurst. Salted nuts and seeds. Canned beans with added salt. Canned or smoked fish. Whole eggs or egg yolks. Chicken or [place] with skin. Dairy Whole or 2% milk, cream, and half-and-half. Whole or full-fat  cream cheese. Whole-fat or sweetened yogurt. Full-fat cheese. Nondairy creamers. Whipped toppings. Processed cheese and cheese spreads. Fats and oils Butter. Stick margarine. Lard. Shortening. Ghee. Bacon fat. Tropical oils, such as coconut, palm kernel, or palm oil. Seasonings and condiments Onion salt, garlic salt, seasoned salt, table salt, and sea salt. Worcestershire sauce. Tartar sauce. Barbecue sauce. Teriyaki sauce. Soy sauce, including reduced-sodium. Steak sauce. Canned and packaged gravies. Fish sauce. Oyster sauce. Cocktail sauce. Store-bought horseradish. Ketchup. Mustard. Meat flavorings and tenderizers. Bouillon cubes. Hot sauces. Pre-made or packaged marinades. Pre-made or packaged taco seasonings. Relishes. Regular salad dressings. Other foods Salted popcorn and pretzels. The items listed above may not be a complete list of foods and beverages you should avoid. Contact a dietitian for more information. Where to find more information National Heart, Lung, and Blood Institute: https://wilson-eaton.com/ American Heart Association: www.heart.org Academy of Nutrition and Dietetics: www.eatright.[place]: www.kidney.org Summary The DASH eating plan is a healthy eating plan that has been shown to reduce high blood pressure (hypertension). It may also reduce your risk for type 2 diabetes, heart disease, and stroke. When on the DASH eating plan, aim to eat more fresh fruits and vegetables, whole grains, lean proteins, low-fat dairy, and heart-healthy fats. With the DASH eating plan, you should limit salt (sodium) intake to 2,300 mg a day. If you have hypertension, you may need to reduce your sodium intake to 1,500 mg a day. Work with your health care provider or dietitian to adjust your eating plan to your individual calorie needs. This information is not intended to replace advice given to you by your health care provider. Make sure you discuss any questions you have with  your health care provider. Document Revised: 10/05/2019 Document Reviewed: 10/05/2019 Elsevier Patient Education  [place] for Diabetes Mellitus, Adult Carbohydrate counting is a method of keeping track of how many carbohydrates you eat. Eating carbohydrates increases the amount of sugar (glucose) in the blood. Counting how many carbohydrates you eat improves how well you manage your blood glucose. This, in turn, helps you manage your diabetes. Carbohydrates are measured in grams (g) per serving. It is important to know how many carbohydrates (in grams or by serving size) you can have in each meal. This is different for every person. A dietitian can help you make a meal plan and calculate how many carbohydrates you should have at each meal and snack. What foods contain carbohydrates? Carbohydrates are found in the following foods: Grains, such as breads and cereals. Dried beans and soy  products. Starchy vegetables, such as potatoes, peas, and corn. Fruit and fruit juices. Milk and yogurt. Sweets and snack foods, such as cake, cookies, candy, chips, and soft drinks. How do I count carbohydrates in foods? There are two ways to count carbohydrates in food. You can read food labels or learn standard serving sizes of foods. You can use either of these methods or a combination of both. Using the Nutrition Facts label The Nutrition Facts list is included on the labels of almost all packaged foods and beverages in the [place]. It includes: The serving size. Information about nutrients in each serving, including the grams of carbohydrate per serving. To use the Nutrition Facts, decide how many servings you will have. Then, multiply the number of servings by the number of carbohydrates per serving. The resulting number is the total grams of carbohydrates that you will be having. Learning the standard serving sizes of foods When you eat carbohydrate foods that are  not packaged or do not include Nutrition Facts on the label, you need to measure the servings in order to count the grams of carbohydrates. Measure the foods that you will eat with a food scale or measuring cup, if needed. Decide how many standard-size servings you will eat. Multiply the number of servings by 15. For foods that contain carbohydrates, one serving equals 15 g of carbohydrates. For example, if you eat 2 cups or 10 oz (300 g) of strawberries, you will have eaten 2 servings and 30 g of carbohydrates (2 servings x 15 g = 30 g). For foods that have more than one food mixed, such as soups and casseroles, you must count the carbohydrates in each food that is included. The following list contains standard serving sizes of common carbohydrate-rich foods. Each of these servings has about 15 g of carbohydrates: 1 slice of bread. 1 six-inch (15 cm) tortilla. ? cup or 2 oz (53 g) cooked rice or pasta.  cup or 3 oz (85 g) cooked or canned, drained and rinsed beans or lentils.  cup or 3 oz (85 g) starchy vegetable, such as peas, corn, or squash.  cup or 4 oz (120 g) hot cereal.  cup or 3 oz (85 g) boiled or mashed potatoes, or  or 3 oz (85 g) of a large baked potato.  cup or 4 fl oz (118 mL) fruit juice. 1 cup or 8 fl oz (237 mL) milk. 1 small or 4 oz (106 g) apple.  or 2 oz (63 g) of a medium banana. 1 cup or 5 oz (150 g) strawberries. 3 cups or 1 oz (28.3 g) popped popcorn. What is an example of carbohydrate counting? To calculate the grams of carbohydrates in this sample meal, follow the steps shown below. Sample meal 3 oz (85 g) chicken breast. ? cup or 4 oz (106 g) brown rice.  cup or 3 oz (85 g) corn. 1 cup or 8 fl oz (237 mL) milk. 1 cup or 5 oz (150 g) strawberries with sugar-free whipped topping. Carbohydrate calculation Identify the foods that contain carbohydrates: Rice. Corn. Milk. Strawberries. Calculate how many servings you have of each food: 2 servings  rice. 1 serving corn. 1 serving milk. 1 serving strawberries. Multiply each number of servings by 15 g: 2 servings rice x 15 g = 30 g. 1 serving corn x 15 g = 15 g. 1 serving milk x 15 g = 15 g. 1 serving strawberries x 15 g = 15 g. Add together all of  the amounts to find the total grams of carbohydrates eaten: 30 g + 15 g + 15 g + 15 g = 75 g of carbohydrates total. What are tips for following this plan? Shopping Develop a meal plan and then make a shopping list. Buy fresh and frozen vegetables, fresh and frozen fruit, dairy, eggs, beans, lentils, and whole grains. Look at food labels. Choose foods that have more fiber and less sugar. Avoid processed foods and foods with added sugars. Meal planning Aim to have the same number of grams of carbohydrates at each meal and for each snack time. Plan to have regular, balanced meals and snacks. Where to find more information American Diabetes Association: diabetes.org Centers for Disease Control and Prevention: StoreMirror.com.[place] Academy of Nutrition and Dietetics: eatright.org Association of Diabetes Care & Education Specialists: diabeteseducator.org Summary Carbohydrate counting is a method of keeping track of how many carbohydrates you eat. Eating carbohydrates increases the amount of sugar (glucose) in your blood. Counting how many carbohydrates you eat improves how well you manage your blood glucose. This helps you manage your diabetes. A dietitian can help you make a meal plan and calculate how many carbohydrates you should have at each meal and snack. This information is not intended to replace advice given to you by your health care provider. Make sure you discuss any questions you have with your health care provider. Document Revised: 06/04/2020 Document Reviewed: 06/04/2020 Elsevier Patient Education  [place].

## 2023-02-15 NOTE — Progress Notes (Signed)
Established Patient Office Visit  Subjective   Patient ID: Courtney Stafford, female    DOB: February 01, 1971  Age: 52 y.o. MRN: [id number]  No chief complaint on file.   HPI Courtney Stafford  is a 52 y/o female who has history of T1DM, Hypertension, Stroke,presents for follow up diabetes and high blood pressure, She is here for her last visit with [facility] as she has insurance now. She states that she's compliant with her medications, denies side effects and continues to work on making healthy lifestyle changes. Her HgbA1c done during visit increased from 8.9% to 9%, and her blood glucose was 134 mg/dl.  Patient forgot to bring her log, but reports that her blood glucose readings have been running between 90s- 200s. She reports checking her blood glucose three times a day. She endorses hyperglycemic symptoms, gabapentin controls her peripheral neuropathy and she performs daily foot checks. She states that she had  2 falls in th last 2 weeks where she sustained an abrasion which is healed because she is having difficulty with her gait and balance since the stroke. Her blood pressure readings have been 170s/90s. She denies chest pain, palpitation, light headedness and blurry vision.  She's unable to go for eye exam due to lack of transportation. Overall, she states that she is doing well, and offers no further complain.     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 01/17/2019   HTN  (hypertension) 10/22/2015   Type 1 diabetes 10/22/2015   Anemia 10/22/2015   Restless legs 10/22/2015   Hematuria 04/09/2015   Past Medical History:  Diagnosis Date   Diabetes mellitus without complication    Hypertension    Stroke 07/31/2020   Past Surgical History:  Procedure Laterality Date   CARPAL TUNNEL RELEASE  6 years ago   both hands   Social History   Tobacco Use   Smoking status: Some Days    Packs/day: .01    Types: Cigarettes   Smokeless tobacco: Never   Tobacco comments:    occasional  Vaping Use   Vaping Use: Never used  Substance Use Topics   Alcohol use: No   Drug use: No   Family History  Problem Relation Age of Onset   Other Mother        unknown medical history   Cancer Father    Diabetes Maternal Grandmother    Breast cancer Neg Hx    Allergies  Allergen Reactions   Phenergan [Promethazine] Hives      Review of Systems  Constitutional: Negative.   HENT: Negative.    Eyes:  Positive for blurred vision.  Respiratory: Negative.    Cardiovascular: Negative.   Gastrointestinal: Negative.   Genitourinary:  Positive for frequency.  Musculoskeletal:  Positive for back pain and falls (She has had 2 falls in the last 2 weeks).  Neurological:  Positive for dizziness, tingling and headaches.  Psychiatric/Behavioral: Negative.        Objective:  BP (!) 157/95 (BP Location: Right Arm, Patient Position: Sitting, Cuff Size: Large)   Pulse 83   Resp 16   Wt 199 lb 11.2 oz (90.6 kg)   LMP 01/03/2016 (Within Years)   SpO2 94%   BMI 39.00 kg/m  BP Readings from Last 3 Encounters:  02/15/23 (!) 157/95  10/27/22 (!) 179/97  08/18/22 (!) 172/116   Wt Readings from Last 3 Encounters:  02/15/23 199 lb 11.2 oz (90.6 kg)  10/27/22 197 lb 4.8 oz (89.5 kg)  08/18/22 193 lb 14.4 oz (88 kg)   SpO2 Readings from Last 3 Encounters:  02/15/23 94%  10/27/22 98%  08/18/22 97%      Physical Exam HENT:     Head: Normocephalic and atraumatic.      Nose: Nose normal.     Mouth/Throat:     Mouth: Mucous membranes are moist.     Pharynx: Oropharynx is clear.  Eyes:     Extraocular Movements: Extraocular movements intact.     Conjunctiva/sclera: Conjunctivae normal.     Pupils: Pupils are equal, round, and reactive to light.  Cardiovascular:     Rate and Rhythm: Normal rate and regular rhythm.     Pulses: Normal pulses.     Heart sounds: Normal heart sounds.  Pulmonary:     Effort: Pulmonary effort is normal.     Breath sounds: Normal breath sounds.  Abdominal:     General: Bowel sounds are normal.     Palpations: Abdomen is soft.  Musculoskeletal:        General: Normal range of motion.  Skin:    General: Skin is warm and dry.     Capillary Refill: Capillary refill takes less than 2 seconds.  Neurological:     General: No focal deficit present.     Mental Status: She is alert and oriented to person, place, and time. Mental status is at baseline.  Psychiatric:        Mood and Affect: Mood normal.        Behavior: Behavior normal.        Thought Content: Thought content normal.        Judgment: Judgment normal.      No results found for any visits on 02/15/23.  Last CBC Lab Results  Component Value Date   WBC 6.7 02/02/2022   HGB 15.5 (H) 02/02/2022   HCT 48.0 (H) 02/02/2022   MCV 86.0 02/02/2022   MCH 27.8 02/02/2022   RDW 13.7 02/02/2022   PLT 298 [id number]   Last metabolic panel Lab Results  Component Value Date   GLUCOSE 227 (H) 07/14/2022   NA 139 07/14/2022   K 3.8 07/14/2022   CL 98 07/14/2022   CO2 22 07/14/2022   BUN 11 07/14/2022   CREATININE 0.88 07/14/2022   EGFR 79 07/14/2022   CALCIUM 9.3 07/14/2022   PHOS 3.6 11/12/2012   PROT 7.4 02/02/2022   ALBUMIN 3.8 02/02/2022   LABGLOB 2.9 01/14/2021   AGRATIO 1.6 01/14/2021   BILITOT 0.6 02/02/2022   ALKPHOS 105 02/02/2022   AST 22 02/02/2022   ALT 19 02/02/2022   ANIONGAP 8 02/02/2022   Last lipids Lab Results  Component Value  Date   CHOL 218 (H) 07/31/2020   HDL 78 07/31/2020   LDLCALC 109 (H) 07/31/2020   TRIG 157 (H) 07/31/2020   CHOLHDL 2.8 07/31/2020   Last hemoglobin A1c Lab Results  Component Value Date   HGBA1C 8.9 (A) 10/27/2022   Last  thyroid functions Lab Results  Component Value Date   TSH 0.951 07/31/2020   T4TOTAL 6.3 10/04/2017   Last vitamin D No results found for: "25OHVITD2", "25OHVITD3", "VD25OH" Last vitamin B12 and Folate No results found for: "VITAMINB12", "FOLATE"    The ASCVD Risk score (Arnett DK, et al., 2019) failed to calculate for the following reasons:   The patient has a prior MI or stroke diagnosis    Assessment & Plan:  1. Type 1 diabetes mellitus without complication [id number] today is 9%,  her goal should be less than 6%. Patient will continue her current regimen, low carb/non concentrated sweet diet as well as exercise as tolerated . Patient was also given literature on chair exercises. Advised to continue checking blood glucose, record and take along log to her appointment. Given 3 month supply of lancets and test strips.  - POCT HgB A1C; Future - POCT Glucose (CBG); Future - Insulin Pen Needle (NOVOFINE PEN NEEDLE) 32G X 6 MM MISC; Use daily with Novolog Flexpen  Dispense: 800 each; Refill: 3  2. Primary hypertension Blood pressure today is 157/95 and her goal is 130/80. Patient encouraged to take medication as prescribed, follow DASH diet and exercise as tolerated. Smoking cessation encouraged. Amlodipine change to 10 mg daily.  - hydrALAZINE (APRESOLINE) 25 MG tablet; TAKE ONE TABLET BY MOUTH 2 TIMES A DAY  Dispense: 180 tablet; Refill: 1 - hydrochlorothiazide (HYDRODIURIL) 25 MG tablet; TAKE ONE TABLET BY MOUTH ONCE EVERY DAY.  Dispense: 90 tablet; Refill: 1 - amLODipine (NORVASC) 10 MG tablet; Take 1 tablet (10 mg total) by mouth daily.  Dispense: 90 tablet; Refill: 0 - lisinopril (ZESTRIL) 40 MG tablet; TAKE ONE TABLET BY MOUTH ONCE EVERY DAY.  Dispense: 90  tablet; Refill: 1 - carvedilol (COREG) 12.5 MG tablet; TAKE ONE TABLET BY MOUTH 2 TIMES A DAY WITH A MEAL  Dispense: 180 tablet; Refill: 1  4. Restless legs Encouraged to continue taking medication as prescribed. Also leg exercises as tolerated.  - gabapentin (NEURONTIN) 300 MG capsule; TAKE 2 CAPSULES (600MG ) BY MOUTH 2 TIMES A DAY AND 3 CAPSULES (900MG ) AT NIGHT  Dispense: 210 capsule; Refill: 1     No follow-ups on file. Patient has no follow up appointment because she has active health insurance. [place] wishes her well with her car.   [place] Jerold Coombe, NP

## 2023-02-16 ENCOUNTER — Other Ambulatory Visit: Payer: Self-pay

## 2023-02-16 LAB — POCT GLYCOSYLATED HEMOGLOBIN (HGB A1C): Hemoglobin A1C: 9 % — AB (ref 4.0–5.6)

## 2023-02-16 LAB — GLUCOSE, POCT (MANUAL RESULT ENTRY): POC Glucose: 134 mg/dl — AB (ref 70–99)

## 2023-02-21 ENCOUNTER — Other Ambulatory Visit: Payer: Self-pay

## 2023-03-04 ENCOUNTER — Other Ambulatory Visit: Payer: Self-pay

## 2023-03-16 ENCOUNTER — Other Ambulatory Visit: Payer: Self-pay

## 2023-03-21 ENCOUNTER — Other Ambulatory Visit: Payer: Self-pay

## 2023-03-24 ENCOUNTER — Other Ambulatory Visit: Payer: Self-pay

## 2023-03-30 ENCOUNTER — Other Ambulatory Visit: Payer: Self-pay

## 2023-04-28 ENCOUNTER — Other Ambulatory Visit: Payer: Self-pay

## 2023-05-10 ENCOUNTER — Ambulatory Visit: Payer: Self-pay | Admitting: Gerontology

## 2023-05-11 ENCOUNTER — Other Ambulatory Visit: Payer: Self-pay

## 2023-05-11 ENCOUNTER — Ambulatory Visit: Payer: Self-pay | Admitting: Gerontology

## 2023-05-11 ENCOUNTER — Encounter: Payer: Self-pay | Admitting: Gerontology

## 2023-05-11 VITALS — BP 179/118 | HR 89 | Temp 98.3°F | Resp 16 | Ht 60.0 in | Wt 196.3 lb

## 2023-05-11 DIAGNOSIS — I639 Cerebral infarction, unspecified: Secondary | ICD-10-CM

## 2023-05-11 DIAGNOSIS — F411 Generalized anxiety disorder: Secondary | ICD-10-CM

## 2023-05-11 DIAGNOSIS — I10 Essential (primary) hypertension: Secondary | ICD-10-CM

## 2023-05-11 DIAGNOSIS — G2581 Restless legs syndrome: Secondary | ICD-10-CM

## 2023-05-11 DIAGNOSIS — E109 Type 1 diabetes mellitus without complications: Secondary | ICD-10-CM

## 2023-05-11 LAB — POCT GLYCOSYLATED HEMOGLOBIN (HGB A1C): Hemoglobin A1C: 7.3 % — AB (ref 4.0–5.6)

## 2023-05-11 LAB — GLUCOSE, POCT (MANUAL RESULT ENTRY): POC Glucose: 272 mg/dl — AB (ref 70–99)

## 2023-05-11 MED ORDER — HYDROCHLOROTHIAZIDE 25 MG PO TABS
25.0000 mg | ORAL_TABLET | Freq: Every day | ORAL | 0 refills | Status: DC
Start: 2023-05-11 — End: 2023-06-08
  Filled 2023-05-11: qty 30, 30d supply, fill #0

## 2023-05-11 MED ORDER — LISINOPRIL 40 MG PO TABS
40.0000 mg | ORAL_TABLET | Freq: Every day | ORAL | 0 refills | Status: DC
Start: 2023-05-11 — End: 2023-06-08
  Filled 2023-05-11: qty 30, 30d supply, fill #0

## 2023-05-11 MED ORDER — ASPIRIN 81 MG PO TBEC
81.0000 mg | DELAYED_RELEASE_TABLET | Freq: Every day | ORAL | 0 refills | Status: DC
Start: 2023-05-11 — End: 2023-10-20
  Filled 2023-05-11: qty 30, 30d supply, fill #0

## 2023-05-11 MED ORDER — GABAPENTIN 300 MG PO CAPS
ORAL_CAPSULE | ORAL | 0 refills | Status: DC
Start: 2023-05-11 — End: 2023-06-22
  Filled 2023-05-11: qty 90, 15d supply, fill #0

## 2023-05-11 MED ORDER — BASAGLAR KWIKPEN 100 UNIT/ML ~~LOC~~ SOPN
15.0000 [IU] | PEN_INJECTOR | Freq: Every day | SUBCUTANEOUS | 0 refills | Status: DC
Start: 2023-05-11 — End: 2023-06-08
  Filled 2023-05-11: qty 3, 20d supply, fill #0

## 2023-05-11 MED ORDER — ATORVASTATIN CALCIUM 80 MG PO TABS
80.0000 mg | ORAL_TABLET | Freq: Every day | ORAL | 0 refills | Status: DC
Start: 2023-05-11 — End: 2023-06-08
  Filled 2023-05-11: qty 30, 30d supply, fill #0

## 2023-05-11 MED ORDER — INSULIN LISPRO (1 UNIT DIAL) 100 UNIT/ML (KWIKPEN)
5.0000 [IU] | PEN_INJECTOR | Freq: Three times a day (TID) | SUBCUTANEOUS | 0 refills | Status: DC
Start: 2023-05-11 — End: 2023-06-08
  Filled 2023-05-11: qty 15, 100d supply, fill #0

## 2023-05-11 MED ORDER — UNIFINE PENTIPS 31G X 5 MM MISC
1.0000 | 12 refills | Status: DC
Start: 2023-05-11 — End: 2023-10-20
  Filled 2023-05-11: qty 100, 25d supply, fill #0

## 2023-05-11 MED ORDER — CARVEDILOL 12.5 MG PO TABS
12.5000 mg | ORAL_TABLET | Freq: Two times a day (BID) | ORAL | 0 refills | Status: DC
Start: 2023-05-11 — End: 2023-06-08
  Filled 2023-05-11: qty 60, 30d supply, fill #0

## 2023-05-11 MED ORDER — MIRTAZAPINE 15 MG PO TABS
15.0000 mg | ORAL_TABLET | Freq: Every day | ORAL | 0 refills | Status: DC
Start: 2023-05-11 — End: 2023-06-08
  Filled 2023-05-11: qty 30, 30d supply, fill #0

## 2023-05-11 MED ORDER — HYDRALAZINE HCL 25 MG PO TABS
25.0000 mg | ORAL_TABLET | Freq: Two times a day (BID) | ORAL | 0 refills | Status: DC
Start: 2023-05-11 — End: 2023-06-08
  Filled 2023-05-11: qty 60, 30d supply, fill #0

## 2023-05-11 MED ORDER — AMLODIPINE BESYLATE 10 MG PO TABS
10.0000 mg | ORAL_TABLET | Freq: Every day | ORAL | 0 refills | Status: DC
Start: 2023-05-11 — End: 2023-06-08
  Filled 2023-05-11: qty 30, 30d supply, fill #0

## 2023-05-11 NOTE — Patient Instructions (Signed)
DASH Eating Plan DASH stands for Dietary Approaches to Stop Hypertension. The DASH eating plan is a healthy eating plan that has been shown to: Lower high blood pressure (hypertension). Reduce your risk for type 2 diabetes, heart disease, and stroke. Help with weight loss. What are tips for following this plan? Reading food labels Check food labels for the amount of salt (sodium) per serving. Choose foods with less than 5 percent of the Daily Value (DV) of sodium. In general, foods with less than 300 milligrams (mg) of sodium per serving fit into this eating plan. To find whole grains, look for the word "whole" as the first word in the ingredient list. Shopping Buy products labeled as "low-sodium" or "no salt added." Buy fresh foods. Avoid canned foods and pre-made or frozen meals. Cooking Try not to add salt when you cook. Use salt-free seasonings or herbs instead of table salt or sea salt. Check with your health care provider or pharmacist before using salt substitutes. Do not fry foods. Cook foods in healthy ways, such as baking, boiling, grilling, roasting, or broiling. Cook using oils that are good for your heart. These include olive, canola, avocado, soybean, and sunflower oil. Meal planning  Eat a balanced diet. This should include: 4 or more servings of fruits and 4 or more servings of vegetables each day. Try to fill half of your plate with fruits and vegetables. 6-8 servings of whole grains each day. 6 or less servings of lean meat, poultry, or fish each day. 1 oz is 1 serving. A 3 oz (85 g) serving of meat is about the same size as the palm of your hand. One egg is 1 oz (28 g). 2-3 servings of low-fat dairy each day. One serving is 1 cup (237 mL). 1 serving of nuts, seeds, or beans 5 times each week. 2-3 servings of heart-healthy fats. Healthy fats called omega-3 fatty acids are found in foods such as walnuts, flaxseeds, fortified milks, and eggs. These fats are also found in  cold-water fish, such as sardines, salmon, and mackerel. Limit how much you eat of: Canned or prepackaged foods. Food that is high in trans fat, such as fried foods. Food that is high in saturated fat, such as fatty meat. Desserts and other sweets, sugary drinks, and other foods with added sugar. Full-fat dairy products. Do not salt foods before eating. Do not eat more than 4 egg yolks a week. Try to eat at least 2 vegetarian meals a week. Eat more home-cooked food and less restaurant, buffet, and fast food. Lifestyle When eating at a restaurant, ask if your food can be made with less salt or no salt. If you drink alcohol: Limit how much you have to: 0-1 drink a day if you are female. 0-2 drinks a day if you are female. Know how much alcohol is in your drink. In the [place] one drink is one 12 oz bottle of beer (355 mL), one 5 oz glass of wine (148 mL), or one 1 oz glass of hard liquor (44 mL). General information Avoid eating more than 2,300 mg of salt a day. If you have hypertension, you may need to reduce your sodium intake to 1,500 mg a day. Work with your provider to stay at a healthy body weight or lose weight. Ask what the best weight range is for you. On most days of the week, get at least 30 minutes of exercise that causes your heart to beat faster. This may include walking, swimming, or   biking. Work with your provider or dietitian to adjust your eating plan to meet your specific calorie needs. What foods should I eat? Fruits All fresh, dried, or frozen fruit. Canned fruits that are in their natural juice and do not have sugar added to them. Vegetables Fresh or frozen vegetables that are raw, steamed, roasted, or grilled. Low-sodium or reduced-sodium tomato and vegetable juice. Low-sodium or reduced-sodium tomato sauce and tomato paste. Low-sodium or reduced-sodium canned vegetables. Grains Whole-grain or whole-wheat bread. Whole-grain or whole-wheat pasta. Brown rice. Oatmeal.  Quinoa. Bulgur. Whole-grain and low-sodium cereals. Pita bread. Low-fat, low-sodium crackers. Whole-wheat flour tortillas. Meats and other proteins Skinless chicken or turkey. Ground chicken or turkey. Pork with fat trimmed off. Fish and seafood. Egg whites. Dried beans, peas, or lentils. Unsalted nuts, nut butters, and seeds. Unsalted canned beans. Lean cuts of beef with fat trimmed off. Low-sodium, lean precooked or cured meat, such as sausages or meat loaves. Dairy Low-fat (1%) or fat-free (skim) milk. Reduced-fat, low-fat, or fat-free cheeses. Nonfat, low-sodium ricotta or cottage cheese. Low-fat or nonfat yogurt. Low-fat, low-sodium cheese. Fats and oils Soft margarine without trans fats. Vegetable oil. Reduced-fat, low-fat, or light mayonnaise and salad dressings (reduced-sodium). Canola, safflower, olive, avocado, soybean, and sunflower oils. Avocado. Seasonings and condiments Herbs. Spices. Seasoning mixes without salt. Other foods Unsalted popcorn and pretzels. Fat-free sweets. The items listed above may not be all the foods and drinks you can have. Talk to a dietitian to learn more. What foods should I avoid? Fruits Canned fruit in a light or heavy syrup. Fried fruit. Fruit in cream or butter sauce. Vegetables Creamed or fried vegetables. Vegetables in a cheese sauce. Regular canned vegetables that are not marked as low-sodium or reduced-sodium. Regular canned tomato sauce and paste that are not marked as low-sodium or reduced-sodium. Regular tomato and vegetable juices that are not marked as low-sodium or reduced-sodium. Pickles. Olives. Grains Baked goods made with fat, such as croissants, muffins, or some breads. Dry pasta or rice meal packs. Meats and other proteins Fatty cuts of meat. Ribs. Fried meat. Bacon. Bologna, salami, and other precooked or cured meats, such as sausages or meat loaves, that are not lean and low in sodium. Fat from the back of a pig (fatback). Bratwurst.  Salted nuts and seeds. Canned beans with added salt. Canned or smoked fish. Whole eggs or egg yolks. Chicken or turkey with skin. Dairy Whole or 2% milk, cream, and half-and-half. Whole or full-fat cream cheese. Whole-fat or sweetened yogurt. Full-fat cheese. Nondairy creamers. Whipped toppings. Processed cheese and cheese spreads. Fats and oils Butter. Stick margarine. Lard. Shortening. Ghee. Bacon fat. Tropical oils, such as coconut, palm kernel, or palm oil. Seasonings and condiments Onion salt, garlic salt, seasoned salt, table salt, and sea salt. Worcestershire sauce. Tartar sauce. Barbecue sauce. Teriyaki sauce. Soy sauce, including reduced-sodium soy sauce. Steak sauce. Canned and packaged gravies. Fish sauce. Oyster sauce. Cocktail sauce. Store-bought horseradish. Ketchup. Mustard. Meat flavorings and tenderizers. Bouillon cubes. Hot sauces. Pre-made or packaged marinades. Pre-made or packaged taco seasonings. Relishes. Regular salad dressings. Other foods Salted popcorn and pretzels. The items listed above may not be all the foods and drinks you should avoid. Talk to a dietitian to learn more. Where to find more information National Heart, Lung, and Blood Institute (NHLBI): nhlbi.nih.gov American Heart Association (AHA): heart.org Academy of Nutrition and Dietetics: eatright.org National Kidney Foundation (NKF): kidney.org This information is not intended to replace advice given to you by your health care provider. Make sure   you discuss any questions you have with your health care provider. Document Revised: 11/18/2022 Document Reviewed: 11/18/2022 Elsevier Patient Education  2024 Elsevier Inc. Carbohydrate Counting for Diabetes Mellitus, Adult Carbohydrate counting is a method of keeping track of how many carbohydrates you eat. Eating carbohydrates increases the amount of sugar (glucose) in the blood. Counting how many carbohydrates you eat improves how well you manage your blood  glucose. This, in turn, helps you manage your diabetes. Carbohydrates are measured in grams (g) per serving. It is important to know how many carbohydrates (in grams or by serving size) you can have in each meal. This is different for every person. A dietitian can help you make a meal plan and calculate how many carbohydrates you should have at each meal and snack. What foods contain carbohydrates? Carbohydrates are found in the following foods: Grains, such as breads and cereals. Dried beans and soy products. Starchy vegetables, such as potatoes, peas, and corn. Fruit and fruit juices. Milk and yogurt. Sweets and snack foods, such as cake, cookies, candy, chips, and soft drinks. How do I count carbohydrates in foods? There are two ways to count carbohydrates in food. You can read food labels or learn standard serving sizes of foods. You can use either of these methods or a combination of both. Using the Nutrition Facts label The Nutrition Facts list is included on the labels of almost all packaged foods and beverages in the [place]. It includes: The serving size. Information about nutrients in each serving, including the grams of carbohydrate per serving. To use the Nutrition Facts, decide how many servings you will have. Then, multiply the number of servings by the number of carbohydrates per serving. The resulting number is the total grams of carbohydrates that you will be having. Learning the standard serving sizes of foods When you eat carbohydrate foods that are not packaged or do not include Nutrition Facts on the label, you need to measure the servings in order to count the grams of carbohydrates. Measure the foods that you will eat with a food scale or measuring cup, if needed. Decide how many standard-size servings you will eat. Multiply the number of servings by 15. For foods that contain carbohydrates, one serving equals 15 g of carbohydrates. For example, if you eat 2 cups or  10 oz (300 g) of strawberries, you will have eaten 2 servings and 30 g of carbohydrates (2 servings x 15 g = 30 g). For foods that have more than one food mixed, such as soups and casseroles, you must count the carbohydrates in each food that is included. The following list contains standard serving sizes of common carbohydrate-rich foods. Each of these servings has about 15 g of carbohydrates: 1 slice of bread. 1 six-inch (15 cm) tortilla. ? cup or 2 oz (53 g) cooked rice or pasta.  cup or 3 oz (85 g) cooked or canned, drained and rinsed beans or lentils.  cup or 3 oz (85 g) starchy vegetable, such as peas, corn, or squash.  cup or 4 oz (120 g) hot cereal.  cup or 3 oz (85 g) boiled or mashed potatoes, or  or 3 oz (85 g) of a large baked potato.  cup or 4 fl oz (118 mL) fruit juice. 1 cup or 8 fl oz (237 mL) milk. 1 small or 4 oz (106 g) apple.  or 2 oz (63 g) of a medium banana. 1 cup or 5 oz (150 g) strawberries. 3 cups or 1   oz (28.3 g) popped popcorn. What is an example of carbohydrate counting? To calculate the grams of carbohydrates in this sample meal, follow the steps shown below. Sample meal 3 oz (85 g) chicken breast. ? cup or 4 oz (106 g) brown rice.  cup or 3 oz (85 g) corn. 1 cup or 8 fl oz (237 mL) milk. 1 cup or 5 oz (150 g) strawberries with sugar-free whipped topping. Carbohydrate calculation Identify the foods that contain carbohydrates: Rice. Corn. Milk. Strawberries. Calculate how many servings you have of each food: 2 servings rice. 1 serving corn. 1 serving milk. 1 serving strawberries. Multiply each number of servings by 15 g: 2 servings rice x 15 g = 30 g. 1 serving corn x 15 g = 15 g. 1 serving milk x 15 g = 15 g. 1 serving strawberries x 15 g = 15 g. Add together all of the amounts to find the total grams of carbohydrates eaten: 30 g + 15 g + 15 g + 15 g = 75 g of carbohydrates total. What are tips for following this plan? Shopping Develop  a meal plan and then make a shopping list. Buy fresh and frozen vegetables, fresh and frozen fruit, dairy, eggs, beans, lentils, and whole grains. Look at food labels. Choose foods that have more fiber and less sugar. Avoid processed foods and foods with added sugars. Meal planning Aim to have the same number of grams of carbohydrates at each meal and for each snack time. Plan to have regular, balanced meals and snacks. Where to find more information American Diabetes Association: diabetes.org Centers for Disease Control and Prevention: cdc.gov Academy of Nutrition and Dietetics: eatright.org Association of Diabetes Care & Education Specialists: diabeteseducator.org Summary Carbohydrate counting is a method of keeping track of how many carbohydrates you eat. Eating carbohydrates increases the amount of sugar (glucose) in your blood. Counting how many carbohydrates you eat improves how well you manage your blood glucose. This helps you manage your diabetes. A dietitian can help you make a meal plan and calculate how many carbohydrates you should have at each meal and snack. This information is not intended to replace advice given to you by your health care provider. Make sure you discuss any questions you have with your health care provider. Document Revised: 06/04/2020 Document Reviewed: 06/04/2020 Elsevier Patient Education  2024 Elsevier Inc.  

## 2023-05-11 NOTE — Progress Notes (Signed)
Established Patient Office Visit  Subjective   Patient ID: Courtney Stafford, female    DOB: December 17, 1970  Age: 52 y.o. MRN: [id number]  Chief Complaint  Patient presents with   Follow-up   Diabetes    Patient has been out of insulin x 2 weeks   Hypertension    Patient has been out of hydrochlorothiazide x 1-2 weeks    HPI  Courtney Stafford  is a 52 y/o female who has history of T1DM, Hypertension, Stroke,presents for follow up diabetes and high blood pressure. She states that she was out of her medications 2 weeks ago and did not have money for her co payment to get her medications.  She has [employer] but didn't know when she was signed up. Her blood pressure was elevated at 179/118, denies headache, blurry vision, and chest pain. Her HgbA1c checked during visit decreased from 9% to 7.3%, and blood glucose was 272 mg/dl. She checks her blood glucose qid, and forgot to bring her log. She endorses hyperglycemic symptoms, and intermittent peripheral neuropathy and performs daily foot checks.  She stated that her mood is good, denies suicidal no homicidal ideation.  Overall, she states that she's doing well, but concerned about not having medicaid and offers no further complaint.  Review of Systems  Constitutional: Negative.   Eyes: Negative.   Respiratory: Negative.    Cardiovascular: Negative.   Genitourinary: Negative.   Musculoskeletal: Negative.   Neurological: Negative.   Endo/Heme/Allergies:  Positive for polydipsia.  Psychiatric/Behavioral: Negative.        Objective:     BP (!) 179/118 (BP Location: Right Arm, Patient Position: Sitting, Cuff Size: Large)   Pulse 89   Temp 98.3 F (36.8 C) (Oral)   Resp 16   Ht 5' (1.524 m)   Wt 196 lb 4.8 oz (89 kg)   LMP 01/03/2016 (Within Years)   SpO2 97%   BMI 38.34 kg/m  BP Readings from Last 3 Encounters:  05/11/23 (!) 179/118  02/15/23 (!) 157/95  10/27/22 (!) 179/97   Wt Readings from Last 3 Encounters:  05/11/23 196 lb 4.8  oz (89 kg)  02/15/23 199 lb 11.2 oz (90.6 kg)  10/27/22 197 lb 4.8 oz (89.5 kg)      Physical Exam HENT:     Head: Normocephalic and atraumatic.     Mouth/Throat:     Mouth: Mucous membranes are moist.  Eyes:     Extraocular Movements: Extraocular movements intact.     Conjunctiva/sclera: Conjunctivae normal.     Pupils: Pupils are equal, round, and reactive to light.  Cardiovascular:     Rate and Rhythm: Normal rate and regular rhythm.     Pulses: Normal pulses.     Heart sounds: Normal heart sounds.  Pulmonary:     Effort: Pulmonary effort is normal.     Breath sounds: Normal breath sounds.  Skin:    General: Skin is warm.  Neurological:     General: No focal deficit present.     Mental Status: She is alert and oriented to person, place, and time. Mental status is at baseline.  Psychiatric:        Mood and Affect: Mood normal.        Behavior: Behavior normal.        Thought Content: Thought content normal.        Judgment: Judgment normal.      Results for orders placed or performed in visit on 05/11/23  POCT Glucose (  CBG)  Result Value Ref Range   POC Glucose 272 (A) 70 - 99 mg/dl  POCT HgB G9F  Result Value Ref Range   Hemoglobin A1C 7.3 (A) 4.0 - 5.6 %   HbA1c POC (<> result, manual entry)     HbA1c, POC (prediabetic range)     HbA1c, POC (controlled diabetic range)      Last CBC Lab Results  Component Value Date   WBC 6.7 02/02/2022   HGB 15.5 (H) 02/02/2022   HCT 48.0 (H) 02/02/2022   MCV 86.0 02/02/2022   MCH 27.8 02/02/2022   RDW 13.7 02/02/2022   PLT 298 02/02/2022   Last metabolic panel Lab Results  Component Value Date   GLUCOSE 227 (H) 07/14/2022   NA 139 07/14/2022   K 3.8 07/14/2022   CL 98 07/14/2022   CO2 22 07/14/2022   BUN 11 07/14/2022   CREATININE 0.88 07/14/2022   EGFR 79 07/14/2022   CALCIUM 9.3 07/14/2022   PHOS 3.6 11/12/2012   PROT 7.4 02/02/2022   ALBUMIN 3.8 02/02/2022   LABGLOB 2.9 01/14/2021   AGRATIO 1.6  01/14/2021   BILITOT 0.6 02/02/2022   ALKPHOS 105 02/02/2022   AST 22 02/02/2022   ALT 19 02/02/2022   ANIONGAP 8 02/02/2022   Last lipids Lab Results  Component Value Date   CHOL 218 (H) 07/31/2020   HDL 78 07/31/2020   LDLCALC 109 (H) 07/31/2020   TRIG 157 (H) 07/31/2020   CHOLHDL 2.8 07/31/2020   Last hemoglobin A1c Lab Results  Component Value Date   HGBA1C 7.3 (A) 05/11/2023   Last thyroid functions Lab Results  Component Value Date   TSH 0.951 07/31/2020   T4TOTAL 6.3 10/04/2017   Last vitamin D No results found for: "25OHVITD2", "25OHVITD3", "VD25OH"    The ASCVD Risk score (Arnett DK, et al., 2019) failed to calculate for the following reasons:   The patient has a prior MI or stroke diagnosis    Assessment & Plan:   1. Type 1 diabetes mellitus without complication (HCC) - Her diabetes is improving, her glargine Basaglar was decreased to 15 units at bedtime, she was encouraged to check blood glucose qid, record and bring log to follow up appointment. She was advised to continue on low carb/non concentrated sweet diet and exercise as tolerated. - POCT Glucose (CBG) - POCT HgB A1C - insulin lispro (HUMALOG) 100 UNIT/ML KwikPen; Inject 5 Units into the skin 3 (three) times daily before meals.  Dispense: 15 mL; Refill: 0 - Insulin Glargine (BASAGLAR KWIKPEN) 100 UNIT/ML; Inject 15 Units into the skin at bedtime.  Dispense: 3 mL; Refill: 0 - Insulin Pen Needle (UNIFINE PENTIPS) 31G X 5 MM MISC; Use 1 each by Does not apply route as directed.  Dispense: 200 each; Refill: 12  2. Essential hypertension - Her blood pressure is not under control, Pharmacy stated that she can pick up a 30 day of her medications. She was educated on the signs and symptoms of Stroke and advised to go to the ED. She was encouraged to continue on DASH diet and exercise as tolerated. - amLODipine (NORVASC) 10 MG tablet; Take 1 tablet (10 mg total) by mouth daily.  Dispense: 30 tablet; Refill:  0 - aspirin EC 81 MG tablet; Take 1 tablet (81 mg total) by mouth once daily.  Dispense: 30 tablet; Refill: 0 - carvedilol (COREG) 12.5 MG tablet; Take 1 tablet (12.5 mg total) by mouth 2 (two) times daily with a meal.  Dispense:  60 tablet; Refill: 0 - hydrALAZINE (APRESOLINE) 25 MG tablet; Take 1 tablet (25 mg total) by mouth 2 (two) times daily.  Dispense: 60 tablet; Refill: 0 - hydrochlorothiazide (HYDRODIURIL) 25 MG tablet; Take 1 tablet (25 mg total) by mouth daily.  Dispense: 30 tablet; Refill: 0 - lisinopril (ZESTRIL) 40 MG tablet; Take 1 tablet (40 mg total) by mouth daily.  Dispense: 30 tablet; Refill: 0  3. Cerebrovascular accident (CVA), unspecified mechanism (HCC) -She will continue current medication, low-fat/cholesterol diet and exercise as tolerated. - atorvastatin (LIPITOR) 80 MG tablet; Take 1 tablet (80 mg total) by mouth daily.  Dispense: 30 tablet; Refill: 0  4. Restless legs --Restless leg is under control with taking medication and she will continue. - gabapentin (NEURONTIN) 300 MG capsule; Take 2 capsules (600 mg total) by mouth 2 (two) times daily AND 3 capsules (900 mg total) Nightly.  Dispense: 90 capsule; Refill: 0  5. Generalized anxiety disorder -She will continue her current medication was advised to go to [facility].  She was provided with the crisis helpline information and advised to call for worsening symptoms. - mirtazapine (REMERON) 15 MG tablet; Take 1 tablet (15 mg total) by mouth once daily at bedtime.  Dispense: 30 tablet; Refill: 0    Return in about 4 weeks (around 06/08/2023), or if symptoms worsen or fail to improve.    Lylie Blacklock Trellis Paganini, NP

## 2023-05-13 ENCOUNTER — Other Ambulatory Visit: Payer: Self-pay

## 2023-06-08 ENCOUNTER — Ambulatory Visit: Payer: Self-pay | Admitting: Gerontology

## 2023-06-08 ENCOUNTER — Encounter: Payer: Self-pay | Admitting: Gerontology

## 2023-06-08 ENCOUNTER — Telehealth: Payer: Self-pay

## 2023-06-08 ENCOUNTER — Other Ambulatory Visit: Payer: Self-pay

## 2023-06-08 VITALS — BP 128/84 | HR 73 | Ht 60.0 in | Wt 193.0 lb

## 2023-06-08 DIAGNOSIS — I10 Essential (primary) hypertension: Secondary | ICD-10-CM

## 2023-06-08 DIAGNOSIS — F411 Generalized anxiety disorder: Secondary | ICD-10-CM

## 2023-06-08 DIAGNOSIS — K0889 Other specified disorders of teeth and supporting structures: Secondary | ICD-10-CM | POA: Insufficient documentation

## 2023-06-08 DIAGNOSIS — I639 Cerebral infarction, unspecified: Secondary | ICD-10-CM

## 2023-06-08 DIAGNOSIS — E109 Type 1 diabetes mellitus without complications: Secondary | ICD-10-CM

## 2023-06-08 MED ORDER — CARVEDILOL 12.5 MG PO TABS
12.5000 mg | ORAL_TABLET | Freq: Two times a day (BID) | ORAL | 0 refills | Status: DC
Start: 2023-06-08 — End: 2023-06-22
  Filled 2023-06-08: qty 60, 30d supply, fill #0

## 2023-06-08 MED ORDER — AMLODIPINE BESYLATE 10 MG PO TABS
10.0000 mg | ORAL_TABLET | Freq: Every day | ORAL | 0 refills | Status: DC
Start: 2023-06-08 — End: 2023-06-22
  Filled 2023-06-08: qty 30, 30d supply, fill #0

## 2023-06-08 MED ORDER — INSULIN LISPRO (1 UNIT DIAL) 100 UNIT/ML (KWIKPEN)
5.0000 [IU] | PEN_INJECTOR | Freq: Three times a day (TID) | SUBCUTANEOUS | 0 refills | Status: DC
Start: 2023-06-08 — End: 2023-06-22
  Filled 2023-06-08: qty 15, 100d supply, fill #0

## 2023-06-08 MED ORDER — LISINOPRIL 40 MG PO TABS
40.0000 mg | ORAL_TABLET | Freq: Every day | ORAL | 0 refills | Status: DC
Start: 2023-06-08 — End: 2023-06-22
  Filled 2023-06-08: qty 30, 30d supply, fill #0

## 2023-06-08 MED ORDER — HYDRALAZINE HCL 25 MG PO TABS
25.0000 mg | ORAL_TABLET | Freq: Two times a day (BID) | ORAL | 0 refills | Status: DC
Start: 2023-06-08 — End: 2023-06-22
  Filled 2023-06-08: qty 60, 30d supply, fill #0

## 2023-06-08 MED ORDER — HYDROCHLOROTHIAZIDE 25 MG PO TABS
25.0000 mg | ORAL_TABLET | Freq: Every day | ORAL | 0 refills | Status: DC
Start: 2023-06-08 — End: 2023-06-22
  Filled 2023-06-08: qty 30, 30d supply, fill #0

## 2023-06-08 MED ORDER — ATORVASTATIN CALCIUM 80 MG PO TABS
80.0000 mg | ORAL_TABLET | Freq: Every day | ORAL | 0 refills | Status: DC
Start: 2023-06-08 — End: 2023-06-22
  Filled 2023-06-08: qty 30, 30d supply, fill #0

## 2023-06-08 MED ORDER — MIRTAZAPINE 15 MG PO TABS
15.0000 mg | ORAL_TABLET | Freq: Every day | ORAL | 0 refills | Status: DC
Start: 2023-06-08 — End: 2023-06-22
  Filled 2023-06-08: qty 30, 30d supply, fill #0

## 2023-06-08 MED ORDER — LANTUS SOLOSTAR 100 UNIT/ML ~~LOC~~ SOPN
15.0000 [IU] | PEN_INJECTOR | Freq: Every day | SUBCUTANEOUS | 0 refills | Status: DC
Start: 2023-06-08 — End: 2023-06-22
  Filled 2023-06-08: qty 30, 200d supply, fill #0

## 2023-06-08 NOTE — Progress Notes (Signed)
Established Patient Office Visit  Subjective   Patient ID: Courtney Stafford, female    DOB: Aug 31, 1971  Age: 52 y.o. MRN: [id number]  Chief Complaint  Patient presents with   Follow-up    HPI  Courtney Stafford  is a 52 y/o female who has history of T1DM, Hypertension, Stroke,presents for follow up of high blood pressure and medication refill. Her blood pressure was elevated during her last visit, currently, her blood pressure is normal at 128/84.  She states that she's compliant with her medications, denies side effects and continues to make healthy lifestyle changes. She c/o a loose incisor tooth that's about to fall out, she denies erythema, swelling. She states that her mood is good, denies suicidal nor homicidal ideation. Overall, she states that she's doing well and offers no further complaint.    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) 01/17/2019   HTN (hypertension) 10/22/2015   Type 1 diabetes (HCC) 10/22/2015   Anemia 10/22/2015   Restless legs 10/22/2015   Hematuria 04/09/2015   Past Medical History:  Diagnosis Date   Diabetes mellitus without complication (HCC)    Hypertension    Stroke (HCC) 07/31/2020   Past Surgical History:  Procedure Laterality Date   CARPAL TUNNEL RELEASE  6 years ago   both hands   Social History   Tobacco Use   Smoking status: Former    Current packs/day: 0.00    Types: Cigarettes     Quit date: 03/2023    Years since quitting: 0.2   Smokeless tobacco: Never  Vaping Use   Vaping status: Never Used  Substance Use Topics   Alcohol use: No   Drug use: No   Family History  Problem Relation Age of Onset   Other Mother        unknown medical history   Cancer Father    Diabetes Maternal Grandmother    Breast cancer Neg Hx    Allergies  Allergen Reactions   Phenergan [Promethazine] Hives      Review of Systems  Constitutional: Negative.   HENT:         Loose incisor tooth  Eyes: Negative.   Respiratory: Negative.    Cardiovascular: Negative.   Neurological: Negative.   Psychiatric/Behavioral: Negative.        Objective:     BP 128/84   Pulse 73   Ht 5' (1.524 m)   Wt 193 lb (87.5 kg)   LMP 01/03/2016 (Within Years)   SpO2 97%   BMI 37.69 kg/m  BP Readings from Last 3 Encounters:  06/08/23 128/84  05/11/23 (!) 179/118  02/15/23 (!) 157/95   Wt Readings from Last 3 Encounters:  06/08/23 193 lb (87.5 kg)  05/11/23 196 lb 4.8 oz (89 kg)  02/15/23 199 lb 11.2 oz (90.6 kg)      Physical Exam HENT:     Head: Normocephalic and atraumatic.     Mouth/Throat:  Mouth: Mucous membranes are moist.  Eyes:     Pupils: Pupils are equal, round, and reactive to light.  Cardiovascular:     Rate and Rhythm: Normal rate and regular rhythm.     Pulses: Normal pulses.     Heart sounds: Normal heart sounds.  Pulmonary:     Effort: Pulmonary effort is normal.     Breath sounds: Normal breath sounds.  Neurological:     General: No focal deficit present.     Mental Status: She is alert and oriented to person, place, and time.  Psychiatric:        Mood and Affect: Mood normal.        Behavior: Behavior normal.        Thought Content: Thought content normal.      No results found for any visits on 06/08/23.  Last CBC Lab Results  Component Value Date   WBC 6.7 02/02/2022   HGB 15.5 (H) 02/02/2022   HCT 48.0 (H) 02/02/2022   MCV 86.0  02/02/2022   MCH 27.8 02/02/2022   RDW 13.7 02/02/2022   PLT 298 02/02/2022   Last metabolic panel Lab Results  Component Value Date   GLUCOSE 227 (H) 07/14/2022   NA 139 07/14/2022   K 3.8 07/14/2022   CL 98 07/14/2022   CO2 22 07/14/2022   BUN 11 07/14/2022   CREATININE 0.88 07/14/2022   EGFR 79 07/14/2022   CALCIUM 9.3 07/14/2022   PHOS 3.6 11/12/2012   PROT 7.4 02/02/2022   ALBUMIN 3.8 02/02/2022   LABGLOB 2.9 01/14/2021   AGRATIO 1.6 01/14/2021   BILITOT 0.6 02/02/2022   ALKPHOS 105 02/02/2022   AST 22 02/02/2022   ALT 19 02/02/2022   ANIONGAP 8 02/02/2022   Last lipids Lab Results  Component Value Date   CHOL 218 (H) 07/31/2020   HDL 78 07/31/2020   LDLCALC 109 (H) 07/31/2020   TRIG 157 (H) 07/31/2020   CHOLHDL 2.8 07/31/2020   Last hemoglobin A1c Lab Results  Component Value Date   HGBA1C 7.3 (A) 05/11/2023   Last thyroid functions Lab Results  Component Value Date   TSH 0.951 07/31/2020   T4TOTAL 6.3 10/04/2017   Last vitamin D No results found for: "25OHVITD2", "25OHVITD3", "VD25OH"    The ASCVD Risk score (Arnett DK, et al., 2019) failed to calculate for the following reasons:   The patient has a prior MI or stroke diagnosis    Assessment & Plan:   1. Essential hypertension - Her blood pressure is under control, will continue current medication, DASH diet and exercise as tolerated. She was advised to check and record her blood pressure daily for possible tapering of her medications. - amLODipine (NORVASC) 10 MG tablet; Take 1 tablet (10 mg total) by mouth daily.  Dispense: 30 tablet; Refill: 0 - carvedilol (COREG) 12.5 MG tablet; Take 1 tablet (12.5 mg total) by mouth 2 (two) times daily with a meal.  Dispense: 60 tablet; Refill: 0 - hydrALAZINE (APRESOLINE) 25 MG tablet; Take 1 tablet (25 mg total) by mouth 2 (two) times daily.  Dispense: 60 tablet; Refill: 0 - hydrochlorothiazide (HYDRODIURIL) 25 MG tablet; Take 1 tablet (25 mg total) by  mouth daily.  Dispense: 30 tablet; Refill: 0 - lisinopril (ZESTRIL) 40 MG tablet; Take 1 tablet (40 mg total) by mouth daily.  Dispense: 30 tablet; Refill: 0  2. Cerebrovascular accident (CVA), unspecified mechanism (HCC) - She will continue current medication, low fat/cholesterol diet and exercise as tolerated. -  atorvastatin (LIPITOR) 80 MG tablet; Take 1 tablet (80 mg total) by mouth daily.  Dispense: 30 tablet; Refill: 0  3. Type 1 diabetes mellitus without complication (HCC) - She will continue current medication, low carb/non concentrated sweet diet and exercise as tolerated. - insulin glargine (LANTUS SOLOSTAR) 100 UNIT/ML Solostar Pen; Inject 15 Units into the skin at bedtime.  Dispense: 30 mL; Refill: 0 - insulin lispro (HUMALOG) 100 UNIT/ML KwikPen; Inject 5 Units into the skin 3 (three) times daily before meals.  Dispense: 15 mL; Refill: 0  4. Generalized anxiety disorder - She will continue current medication and call the Crisis help line with worsening symptoms. - mirtazapine (REMERON) 15 MG tablet; Take 1 tablet (15 mg total) by mouth once daily at bedtime.  Dispense: 30 tablet; Refill: 0  5. Tooth loose - She was provided with dental application and advised to go to the ED for worsening symptoms.   Return in about 2 weeks (around 06/22/2023), or if symptoms worsen or fail to improve.    Kalijah Zeiss Trellis Paganini, NP

## 2023-06-08 NOTE — Patient Instructions (Signed)

## 2023-06-08 NOTE — Telephone Encounter (Signed)
Assisted pt with medicaid paperwork. Sent to health dept.

## 2023-06-13 ENCOUNTER — Other Ambulatory Visit: Payer: Self-pay

## 2023-06-14 ENCOUNTER — Other Ambulatory Visit: Payer: Self-pay

## 2023-06-15 ENCOUNTER — Other Ambulatory Visit: Payer: Self-pay

## 2023-06-16 ENCOUNTER — Other Ambulatory Visit: Payer: Self-pay

## 2023-06-22 ENCOUNTER — Other Ambulatory Visit: Payer: Self-pay

## 2023-06-22 ENCOUNTER — Ambulatory Visit: Payer: Self-pay | Admitting: Gerontology

## 2023-06-22 ENCOUNTER — Encounter: Payer: Self-pay | Admitting: Gerontology

## 2023-06-22 VITALS — BP 123/76 | HR 65 | Ht 60.0 in | Wt 191.0 lb

## 2023-06-22 DIAGNOSIS — F411 Generalized anxiety disorder: Secondary | ICD-10-CM

## 2023-06-22 DIAGNOSIS — G2581 Restless legs syndrome: Secondary | ICD-10-CM

## 2023-06-22 DIAGNOSIS — E109 Type 1 diabetes mellitus without complications: Secondary | ICD-10-CM

## 2023-06-22 DIAGNOSIS — I639 Cerebral infarction, unspecified: Secondary | ICD-10-CM

## 2023-06-22 DIAGNOSIS — I10 Essential (primary) hypertension: Secondary | ICD-10-CM

## 2023-06-22 MED ORDER — GABAPENTIN 300 MG PO CAPS
ORAL_CAPSULE | ORAL | 0 refills | Status: DC
Start: 2023-06-22 — End: 2023-10-20
  Filled 2023-06-22 – 2023-07-01 (×3): qty 90, 13d supply, fill #0

## 2023-06-22 MED ORDER — INSULIN LISPRO (1 UNIT DIAL) 100 UNIT/ML (KWIKPEN)
5.0000 [IU] | PEN_INJECTOR | Freq: Three times a day (TID) | SUBCUTANEOUS | 0 refills | Status: DC
  Filled 2023-06-22: qty 15, 100d supply, fill #0

## 2023-06-22 MED ORDER — ATORVASTATIN CALCIUM 80 MG PO TABS
80.0000 mg | ORAL_TABLET | Freq: Every day | ORAL | 0 refills | Status: DC
Start: 2023-06-22 — End: 2023-10-20
  Filled 2023-06-22 – 2023-07-01 (×3): qty 30, 30d supply, fill #0

## 2023-06-22 MED ORDER — AMLODIPINE BESYLATE 10 MG PO TABS
10.0000 mg | ORAL_TABLET | Freq: Every day | ORAL | 0 refills | Status: DC
Start: 2023-06-22 — End: 2023-10-20
  Filled 2023-06-22 – 2023-07-01 (×3): qty 30, 30d supply, fill #0

## 2023-06-22 MED ORDER — LANTUS SOLOSTAR 100 UNIT/ML ~~LOC~~ SOPN
15.0000 [IU] | PEN_INJECTOR | Freq: Every day | SUBCUTANEOUS | 0 refills | Status: DC
  Filled 2023-06-22 – 2023-07-01 (×2): qty 30, 200d supply, fill #0

## 2023-06-22 MED ORDER — MIRTAZAPINE 15 MG PO TABS
15.0000 mg | ORAL_TABLET | Freq: Every day | ORAL | 0 refills | Status: AC
  Filled 2023-06-22 – 2023-07-01 (×3): qty 30, 30d supply, fill #0

## 2023-06-22 MED ORDER — HYDROCHLOROTHIAZIDE 25 MG PO TABS
25.0000 mg | ORAL_TABLET | Freq: Every day | ORAL | 0 refills | Status: DC
Start: 2023-06-22 — End: 2023-10-20
  Filled 2023-06-22 – 2023-07-01 (×3): qty 30, 30d supply, fill #0

## 2023-06-22 MED ORDER — CARVEDILOL 12.5 MG PO TABS
12.5000 mg | ORAL_TABLET | Freq: Two times a day (BID) | ORAL | 0 refills | Status: DC
  Filled 2023-06-22 – 2023-07-01 (×3): qty 60, 30d supply, fill #0

## 2023-06-22 MED ORDER — LISINOPRIL 40 MG PO TABS
40.0000 mg | ORAL_TABLET | Freq: Every day | ORAL | 0 refills | Status: DC
  Filled 2023-06-22 – 2023-07-01 (×3): qty 30, 30d supply, fill #0

## 2023-06-22 MED ORDER — HYDRALAZINE HCL 25 MG PO TABS
25.0000 mg | ORAL_TABLET | Freq: Two times a day (BID) | ORAL | 0 refills | Status: DC
  Filled 2023-06-22 – 2023-07-01 (×3): qty 60, 30d supply, fill #0

## 2023-06-22 NOTE — Patient Instructions (Signed)
Carbohydrate Counting for Diabetes Mellitus, Adult Carbohydrate counting is a method of keeping track of how many carbohydrates you eat. Eating carbohydrates increases the amount of sugar (glucose) in the blood. Counting how many carbohydrates you eat improves how well you manage your blood glucose. This, in turn, helps you manage your diabetes. Carbohydrates are measured in grams (g) per serving. It is important to know how many carbohydrates (in grams or by serving size) you can have in each meal. This is different for every person. A dietitian can help you make a meal plan and calculate how many carbohydrates you should have at each meal and snack. What foods contain carbohydrates? Carbohydrates are found in the following foods: Grains, such as breads and cereals. Dried beans and soy products. Starchy vegetables, such as potatoes, peas, and corn. Fruit and fruit juices. Milk and yogurt. Sweets and snack foods, such as cake, cookies, candy, chips, and soft drinks. How do I count carbohydrates in foods? There are two ways to count carbohydrates in food. You can read food labels or learn standard serving sizes of foods. You can use either of these methods or a combination of both. Using the Nutrition Facts label The Nutrition Facts list is included on the labels of almost all packaged foods and beverages in the [place]. It includes: The serving size. Information about nutrients in each serving, including the grams of carbohydrate per serving. To use the Nutrition Facts, decide how many servings you will have. Then, multiply the number of servings by the number of carbohydrates per serving. The resulting number is the total grams of carbohydrates that you will be having. Learning the standard serving sizes of foods When you eat carbohydrate foods that are not packaged or do not include Nutrition Facts on the label, you need to measure the servings in order to count the grams of  carbohydrates. Measure the foods that you will eat with a food scale or measuring cup, if needed. Decide how many standard-size servings you will eat. Multiply the number of servings by 15. For foods that contain carbohydrates, one serving equals 15 g of carbohydrates. For example, if you eat 2 cups or 10 oz (300 g) of strawberries, you will have eaten 2 servings and 30 g of carbohydrates (2 servings x 15 g = 30 g). For foods that have more than one food mixed, such as soups and casseroles, you must count the carbohydrates in each food that is included. The following list contains standard serving sizes of common carbohydrate-rich foods. Each of these servings has about 15 g of carbohydrates: 1 slice of bread. 1 six-inch (15 cm) tortilla. ? cup or 2 oz (53 g) cooked rice or pasta.  cup or 3 oz (85 g) cooked or canned, drained and rinsed beans or lentils.  cup or 3 oz (85 g) starchy vegetable, such as peas, corn, or squash.  cup or 4 oz (120 g) hot cereal.  cup or 3 oz (85 g) boiled or mashed potatoes, or  or 3 oz (85 g) of a large baked potato.  cup or 4 fl oz (118 mL) fruit juice. 1 cup or 8 fl oz (237 mL) milk. 1 small or 4 oz (106 g) apple.  or 2 oz (63 g) of a medium banana. 1 cup or 5 oz (150 g) strawberries. 3 cups or 1 oz (28.3 g) popped popcorn. What is an example of carbohydrate counting? To calculate the grams of carbohydrates in this sample meal, follow the steps  shown below. Sample meal 3 oz (85 g) chicken breast. ? cup or 4 oz (106 g) brown rice.  cup or 3 oz (85 g) corn. 1 cup or 8 fl oz (237 mL) milk. 1 cup or 5 oz (150 g) strawberries with sugar-free whipped topping. Carbohydrate calculation Identify the foods that contain carbohydrates: Rice. Corn. Milk. Strawberries. Calculate how many servings you have of each food: 2 servings rice. 1 serving corn. 1 serving milk. 1 serving strawberries. Multiply each number of servings by 15 g: 2 servings rice x 15  g = 30 g. 1 serving corn x 15 g = 15 g. 1 serving milk x 15 g = 15 g. 1 serving strawberries x 15 g = 15 g. Add together all of the amounts to find the total grams of carbohydrates eaten: 30 g + 15 g + 15 g + 15 g = 75 g of carbohydrates total. What are tips for following this plan? Shopping Develop a meal plan and then make a shopping list. Buy fresh and frozen vegetables, fresh and frozen fruit, dairy, eggs, beans, lentils, and whole grains. Look at food labels. Choose foods that have more fiber and less sugar. Avoid processed foods and foods with added sugars. Meal planning Aim to have the same number of grams of carbohydrates at each meal and for each snack time. Plan to have regular, balanced meals and snacks. Where to find more information American Diabetes Association: diabetes.org Centers for Disease Control and Prevention: TonerPromos.no Academy of Nutrition and Dietetics: eatright.org Association of Diabetes Care & Education Specialists: diabeteseducator.org Summary Carbohydrate counting is a method of keeping track of how many carbohydrates you eat. Eating carbohydrates increases the amount of sugar (glucose) in your blood. Counting how many carbohydrates you eat improves how well you manage your blood glucose. This helps you manage your diabetes. A dietitian can help you make a meal plan and calculate how many carbohydrates you should have at each meal and snack. This information is not intended to replace advice given to you by your health care provider. Make sure you discuss any questions you have with your health care provider. Document Revised: 06/04/2020 Document Reviewed: 06/04/2020 Elsevier Patient Education  2024 Else[employer] Inc. DASH Eating Plan DASH stands for Dietary Approaches to Stop Hypertension. The DASH eating plan is a healthy eating plan that has been shown to: Lower high blood pressure (hypertension). Reduce your risk for type 2 diabetes, heart disease, and  stroke. Help with weight loss. What are tips for following this plan? Reading food labels Check food labels for the amount of salt (sodium) per serving. Choose foods with less than 5 percent of the Daily Value (DV) of sodium. In general, foods with less than 300 milligrams (mg) of sodium per serving fit into this eating plan. To find whole grains, look for the word "whole" as the first word in the ingredient list. Shopping Buy products labeled as "low-sodium" or "no salt added." Buy fresh foods. Avoid canned foods and pre-made or frozen meals. Cooking Try not to add salt when you cook. Use salt-free seasonings or herbs instead of table salt or sea salt. Check with your health care provider or pharmacist before using salt substitutes. Do not fry foods. Cook foods in healthy ways, such as baking, boiling, grilling, roasting, or broiling. Cook using oils that are good for your heart. These include olive, canola, avocado, soybean, and sunflower oil. Meal planning  Eat a balanced diet. This should include: 4 or more servings of  fruits and 4 or more servings of vegetables each day. Try to fill half of your plate with fruits and vegetables. 6-8 servings of whole grains each day. 6 or less servings of lean meat, poultry, or fish each day. 1 oz is 1 serving. A 3 oz (85 g) serving of meat is about the same size as the palm of your hand. One egg is 1 oz (28 g). 2-3 servings of low-fat dairy each day. One serving is 1 cup (237 mL). 1 serving of nuts, seeds, or beans 5 times each week. 2-3 servings of heart-healthy fats. Healthy fats called omega-3 fatty acids are found in foods such as walnuts, flaxseeds, fortified milks, and eggs. These fats are also found in cold-water fish, such as sardines, salmon, and mackerel. Limit how much you eat of: Canned or prepackaged foods. Food that is high in trans fat, such as fried foods. Food that is high in saturated fat, such as fatty meat. Desserts and other  sweets, sugary drinks, and other foods with added sugar. Full-fat dairy products. Do not salt foods before eating. Do not eat more than 4 egg yolks a week. Try to eat at least 2 vegetarian meals a week. Eat more home-cooked food and less restaurant, buffet, and fast food. Lifestyle When eating at a restaurant, ask if your food can be made with less salt or no salt. If you drink alcohol: Limit how much you have to: 0-1 drink a day if you are female. 0-2 drinks a day if you are female. Know how much alcohol is in your drink. In the [place] one drink is one 12 oz bottle of beer (355 mL), one 5 oz glass of wine (148 mL), or one 1 oz glass of hard liquor (44 mL). General information Avoid eating more than 2,300 mg of salt a day. If you have hypertension, you may need to reduce your sodium intake to 1,500 mg a day. Work with your provider to stay at a healthy body weight or lose weight. Ask what the best weight range is for you. On most days of the week, get at least 30 minutes of exercise that causes your heart to beat faster. This may include walking, swimming, or biking. Work with your provider or dietitian to adjust your eating plan to meet your specific calorie needs. What foods should I eat? Fruits All fresh, dried, or frozen fruit. Canned fruits that are in their natural juice and do not have sugar added to them. Vegetables Fresh or frozen vegetables that are raw, steamed, roasted, or grilled. Low-sodium or reduced-sodium tomato and vegetable juice. Low-sodium or reduced-sodium tomato sauce and tomato paste. Low-sodium or reduced-sodium canned vegetables. Grains Whole-grain or whole-wheat bread. Whole-grain or whole-wheat pasta. Brown rice. Orpah Cobb. Bulgur. Whole-grain and low-sodium cereals. Pita bread. Low-fat, low-sodium crackers. Whole-wheat flour tortillas. Meats and other proteins Skinless chicken or [place]. Ground chicken or [place]. Pork with fat trimmed off. Fish and seafood.  Egg whites. Dried beans, peas, or lentils. Unsalted nuts, nut butters, and seeds. Unsalted canned beans. Lean cuts of beef with fat trimmed off. Low-sodium, lean precooked or cured meat, such as sausages or meat loaves. Dairy Low-fat (1%) or fat-free (skim) milk. Reduced-fat, low-fat, or fat-free cheeses. Nonfat, low-sodium ricotta or cottage cheese. Low-fat or nonfat yogurt. Low-fat, low-sodium cheese. Fats and oils Soft margarine without trans fats. Vegetable oil. Reduced-fat, low-fat, or light mayonnaise and salad dressings (reduced-sodium). Canola, safflower, olive, avocado, soybean, and sunflower oils. Avocado. Seasonings and condiments Herbs. Spices.  Seasoning mixes without salt. Other foods Unsalted popcorn and pretzels. Fat-free sweets. The items listed above may not be all the foods and drinks you can have. Talk to a dietitian to learn more. What foods should I avoid? Fruits Canned fruit in a light or heavy syrup. Fried fruit. Fruit in cream or butter sauce. Vegetables Creamed or fried vegetables. Vegetables in a cheese sauce. Regular canned vegetables that are not marked as low-sodium or reduced-sodium. Regular canned tomato sauce and paste that are not marked as low-sodium or reduced-sodium. Regular tomato and vegetable juices that are not marked as low-sodium or reduced-sodium. Rosita Fire. Olives. Grains Baked goods made with fat, such as croissants, muffins, or some breads. Dry pasta or rice meal packs. Meats and other proteins Fatty cuts of meat. Ribs. Fried meat. Tomasa Blase. Bologna, salami, and other precooked or cured meats, such as sausages or meat loaves, that are not lean and low in sodium. Fat from the back of a pig (fatback). Bratwurst. Salted nuts and seeds. Canned beans with added salt. Canned or smoked fish. Whole eggs or egg yolks. Chicken or [place] with skin. Dairy Whole or 2% milk, cream, and half-and-half. Whole or full-fat cream cheese. Whole-fat or sweetened yogurt.  Full-fat cheese. Nondairy creamers. Whipped toppings. Processed cheese and cheese spreads. Fats and oils Butter. Stick margarine. Lard. Shortening. Ghee. Bacon fat. Tropical oils, such as coconut, palm kernel, or palm oil. Seasonings and condiments Onion salt, garlic salt, seasoned salt, table salt, and sea salt. Worcestershire sauce. Tartar sauce. Barbecue sauce. Teriyaki sauce. Soy sauce, including reduced-sodium soy sauce. Steak sauce. Canned and packaged gravies. Fish sauce. Oyster sauce. Cocktail sauce. Store-bought horseradish. Ketchup. Mustard. Meat flavorings and tenderizers. Bouillon cubes. Hot sauces. Pre-made or packaged marinades. Pre-made or packaged taco seasonings. Relishes. Regular salad dressings. Other foods Salted popcorn and pretzels. The items listed above may not be all the foods and drinks you should avoid. Talk to a dietitian to learn more. Where to find more information National Heart, Lung, and Blood Institute (NHLBI): BuffaloDryCleaner.gl American Heart Association (AHA): heart.org Academy of Nutrition and Dietetics: eatright.org National Kidney Foundation (NKF): kidney.org This information is not intended to replace advice given to you by your health care provider. Make sure you discuss any questions you have with your health care provider. Document Revised: 11/18/2022 Document Reviewed: 11/18/2022 Elsevier Patient Education  2024 [employer].

## 2023-06-22 NOTE — Progress Notes (Signed)
Established Patient Office Visit  Subjective   Patient ID: Courtney Stafford, female    DOB: December 19, 1970  Age: 52 y.o. MRN: [id number]  Chief Complaint  Patient presents with   Follow-up    HPI  Courtney Stafford  is a 52 y/o female who has history of T1DM, Hypertension, Stroke,presents for follow up visit. She states that she's compliant with her medications, denies side effects and continues to make healthy lifestyle changes. She states that her loose tooth was extracted at the Dentist yesterday. She denies pain, bleeding and swelling.  She denies chest pain, palpitation, vision changes and lightheadedness.  Overall, she states that she is doing well and offers no further complaint.    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) 01/17/2019   HTN (hypertension) 10/22/2015   Type 1 diabetes (HCC) 10/22/2015   Anemia 10/22/2015   Restless legs 10/22/2015   Hematuria 04/09/2015   Past Medical History:  Diagnosis Date   Diabetes mellitus without complication (HCC)    Hypertension    Stroke (HCC) 07/31/2020   Past Surgical History:  Procedure Laterality Date   CARPAL TUNNEL RELEASE  6 years ago   both hands   Social History   Tobacco Use   Smoking status: Former    Current packs/day: 0.00    Types: Cigarettes    Quit date: 03/2023    Years since quitting: 0.2   Smokeless tobacco: Never  Vaping Use   Vaping status: Never Used   Substance Use Topics   Alcohol use: No   Drug use: No   Family History  Problem Relation Age of Onset   Other Mother        unknown medical history   Cancer Father    Diabetes Maternal Grandmother    Breast cancer Neg Hx    Allergies  Allergen Reactions   Phenergan [Promethazine] Hives      Review of Systems  Constitutional: Negative.   Eyes: Negative.   Respiratory: Negative.    Cardiovascular: Negative.   Neurological: Negative.   Psychiatric/Behavioral: Negative.        Objective:     BP 123/76   Pulse 65   Ht 5' (1.524 m)   Wt 191 lb (86.6 kg)   LMP 01/03/2016 (Within Years)   SpO2 95%   BMI 37.30 kg/m  BP Readings from Last 3 Encounters:  06/22/23 123/76  06/08/23 128/84  05/11/23 (!) 179/118   Wt Readings from Last 3 Encounters:  06/22/23 191 lb (86.6 kg)  06/08/23 193 lb (87.5 kg)  05/11/23 196 lb 4.8 oz (89 kg)      Physical Exam HENT:     Head: Normocephalic and atraumatic.     Mouth/Throat:     Mouth: Mucous membranes are moist.  Eyes:     Pupils: Pupils are equal, round, and reactive to light.  Cardiovascular:     Rate and Rhythm: Normal rate and regular rhythm.  Pulses: Normal pulses.     Heart sounds: Normal heart sounds.  Pulmonary:     Effort: Pulmonary effort is normal.     Breath sounds: Normal breath sounds.  Skin:    General: Skin is warm.  Neurological:     General: No focal deficit present.     Mental Status: She is alert and oriented to person, place, and time. Mental status is at baseline.  Psychiatric:        Mood and Affect: Mood normal.        Behavior: Behavior normal.        Thought Content: Thought content normal.        Judgment: Judgment normal.      No results found for any visits on 06/22/23.  Last CBC Lab Results  Component Value Date   WBC 6.7 02/02/2022   HGB 15.5 (H) 02/02/2022   HCT 48.0 (H) 02/02/2022   MCV 86.0 02/02/2022   MCH 27.8 02/02/2022   RDW 13.7 02/02/2022   PLT 298  02/02/2022   Last metabolic panel Lab Results  Component Value Date   GLUCOSE 227 (H) 07/14/2022   NA 139 07/14/2022   K 3.8 07/14/2022   CL 98 07/14/2022   CO2 22 07/14/2022   BUN 11 07/14/2022   CREATININE 0.88 07/14/2022   EGFR 79 07/14/2022   CALCIUM 9.3 07/14/2022   PHOS 3.6 11/12/2012   PROT 7.4 02/02/2022   ALBUMIN 3.8 02/02/2022   LABGLOB 2.9 01/14/2021   AGRATIO 1.6 01/14/2021   BILITOT 0.6 02/02/2022   ALKPHOS 105 02/02/2022   AST 22 02/02/2022   ALT 19 02/02/2022   ANIONGAP 8 02/02/2022   Last lipids Lab Results  Component Value Date   CHOL 218 (H) 07/31/2020   HDL 78 07/31/2020   LDLCALC 109 (H) 07/31/2020   TRIG 157 (H) 07/31/2020   CHOLHDL 2.8 07/31/2020   Last hemoglobin A1c Lab Results  Component Value Date   HGBA1C 7.3 (A) 05/11/2023   Last thyroid functions Lab Results  Component Value Date   TSH 0.951 07/31/2020   T4TOTAL 6.3 10/04/2017   Last vitamin D No results found for: "25OHVITD2", "25OHVITD3", "VD25OH"    The ASCVD Risk score (Arnett DK, et al., 2019) failed to calculate for the following reasons:   The patient has a prior MI or stroke diagnosis    Assessment & Plan:   1. Essential hypertension -Her blood pressure is under control, she will continue current medication, was advised to check blood pressure, record and bring log to follow-up appointment.  She was encouraged to continue on DASH diet and exercise as tolerated. - amLODipine (NORVASC) 10 MG tablet; Take 1 tablet (10 mg total) by mouth daily.  Dispense: 30 tablet; Refill: 0 - carvedilol (COREG) 12.5 MG tablet; Take 1 tablet (12.5 mg total) by mouth 2 (two) times daily with a meal.  Dispense: 60 tablet; Refill: 0 - hydrALAZINE (APRESOLINE) 25 MG tablet; Take 1 tablet (25 mg total) by mouth 2 (two) times daily.  Dispense: 60 tablet; Refill: 0 - hydrochlorothiazide (HYDRODIURIL) 25 MG tablet; Take 1 tablet (25 mg total) by mouth daily.  Dispense: 30 tablet; Refill: 0 -  lisinopril (ZESTRIL) 40 MG tablet; Take 1 tablet (40 mg total) by mouth daily.  Dispense: 30 tablet; Refill: 0  2. Cerebrovascular accident (CVA), unspecified mechanism (HCC) -She will continue current medication, low-fat/cholesterol diet and exercise as tolerated. - atorvastatin (LIPITOR) 80 MG tablet; Take 1 tablet (80 mg total) by mouth  daily.  Dispense: 30 tablet; Refill: 0  3. Restless legs -She will continue current medication. - gabapentin (NEURONTIN) 300 MG capsule; Take 2 capsules (600 mg total) by mouth 2 (two) times daily AND 3 capsules (900 mg total) Nightly.  Dispense: 90 capsule; Refill: 0  4. Type 1 diabetes mellitus without complication (HCC) -She will continue current medication, will follow-up in 1 month for her hemoglobin A1c.  She was encouraged to continue on low carbohydrate/non concentrated sweet diet and exercise as tolerated. - insulin glargine (LANTUS SOLOSTAR) 100 UNIT/ML Solostar Pen; Inject 15 Units into the skin at bedtime.  Dispense: 30 mL; Refill: 0 - insulin lispro (HUMALOG) 100 UNIT/ML KwikPen; Inject 5 Units into the skin 3 (three) times daily before meals.  Dispense: 15 mL; Refill: 0  5. Generalized anxiety disorder -She will continue current medication and was advised to follow-up at [facility].  Mother advised to call the crisis helpline with worsening symptoms. - mirtazapine (REMERON) 15 MG tablet; Take 1 tablet (15 mg total) by mouth once daily at bedtime.  Dispense: 30 tablet; Refill: 0   Return in about 7 weeks (around 08/11/2023), or if symptoms worsen or fail to improve.    Kamillah Didonato Trellis Paganini, NP

## 2023-07-01 ENCOUNTER — Other Ambulatory Visit: Payer: Self-pay

## 2023-07-04 ENCOUNTER — Other Ambulatory Visit: Payer: Self-pay

## 2023-08-10 ENCOUNTER — Ambulatory Visit: Payer: Self-pay | Admitting: Gerontology

## 2023-09-16 DEATH — deceased

## 2023-09-22 ENCOUNTER — Other Ambulatory Visit: Payer: Self-pay

## 2023-10-11 ENCOUNTER — Other Ambulatory Visit: Payer: Self-pay

## 2023-11-02 ENCOUNTER — Other Ambulatory Visit: Payer: Self-pay

## 2023-11-15 ENCOUNTER — Other Ambulatory Visit: Payer: Self-pay

## 2023-12-02 ENCOUNTER — Other Ambulatory Visit: Payer: Self-pay
